# Patient Record
Sex: Male | Born: 1950 | Race: White | Hispanic: No | Smoking: Never smoker
Health system: Southern US, Community
[De-identification: ages and names within clinical notes are randomized; demographics above are authoritative.]

## PROBLEM LIST (undated history)

## (undated) DIAGNOSIS — L309 Dermatitis, unspecified: Secondary | ICD-10-CM

## (undated) DIAGNOSIS — F99 Mental disorder, not otherwise specified: Secondary | ICD-10-CM

## (undated) DIAGNOSIS — Z9114 Patient's other noncompliance with medication regimen: Secondary | ICD-10-CM

## (undated) DIAGNOSIS — M1990 Unspecified osteoarthritis, unspecified site: Secondary | ICD-10-CM

## (undated) DIAGNOSIS — D649 Anemia, unspecified: Secondary | ICD-10-CM

## (undated) DIAGNOSIS — Z992 Dependence on renal dialysis: Secondary | ICD-10-CM

## (undated) DIAGNOSIS — Z91148 Patient's other noncompliance with medication regimen for other reason: Secondary | ICD-10-CM

## (undated) DIAGNOSIS — N186 End stage renal disease: Secondary | ICD-10-CM

## (undated) DIAGNOSIS — M545 Low back pain, unspecified: Secondary | ICD-10-CM

## (undated) DIAGNOSIS — J90 Pleural effusion, not elsewhere classified: Secondary | ICD-10-CM

## (undated) DIAGNOSIS — I509 Heart failure, unspecified: Secondary | ICD-10-CM

## (undated) DIAGNOSIS — K219 Gastro-esophageal reflux disease without esophagitis: Secondary | ICD-10-CM

## (undated) DIAGNOSIS — C801 Malignant (primary) neoplasm, unspecified: Secondary | ICD-10-CM

## (undated) DIAGNOSIS — I10 Essential (primary) hypertension: Secondary | ICD-10-CM

## (undated) DIAGNOSIS — IMO0001 Reserved for inherently not codable concepts without codable children: Secondary | ICD-10-CM

## (undated) DIAGNOSIS — F419 Anxiety disorder, unspecified: Secondary | ICD-10-CM

## (undated) DIAGNOSIS — F321 Major depressive disorder, single episode, moderate: Secondary | ICD-10-CM

## (undated) DIAGNOSIS — Z9289 Personal history of other medical treatment: Secondary | ICD-10-CM

## (undated) HISTORY — PX: CERVICAL FUSION: SHX112

## (undated) HISTORY — PX: CARPAL TUNNEL RELEASE: SHX101

## (undated) HISTORY — PX: KNEE ARTHROSCOPY: SHX127

## (undated) HISTORY — PX: ROTATOR CUFF REPAIR: SHX139

## (undated) HISTORY — DX: Major depressive disorder, single episode, moderate: F32.1

## (undated) HISTORY — PX: OTHER SURGICAL HISTORY: SHX169

## (undated) HISTORY — PX: EYE SURGERY: SHX253

---

## 1999-06-04 ENCOUNTER — Ambulatory Visit (HOSPITAL_COMMUNITY): Admission: RE | Admit: 1999-06-04 | Discharge: 1999-06-04 | Payer: Self-pay | Admitting: Neurosurgery

## 1999-06-04 ENCOUNTER — Encounter: Payer: Self-pay | Admitting: Neurosurgery

## 1999-07-01 ENCOUNTER — Ambulatory Visit (HOSPITAL_COMMUNITY): Admission: RE | Admit: 1999-07-01 | Discharge: 1999-07-02 | Payer: Self-pay | Admitting: Neurosurgery

## 1999-07-01 ENCOUNTER — Encounter: Payer: Self-pay | Admitting: Neurosurgery

## 1999-07-27 ENCOUNTER — Ambulatory Visit (HOSPITAL_COMMUNITY): Admission: RE | Admit: 1999-07-27 | Discharge: 1999-07-27 | Payer: Self-pay | Admitting: Neurosurgery

## 1999-07-27 ENCOUNTER — Encounter: Payer: Self-pay | Admitting: Neurosurgery

## 1999-08-17 ENCOUNTER — Ambulatory Visit (HOSPITAL_COMMUNITY): Admission: RE | Admit: 1999-08-17 | Discharge: 1999-08-17 | Payer: Self-pay | Admitting: Neurosurgery

## 1999-08-17 ENCOUNTER — Encounter: Payer: Self-pay | Admitting: Neurosurgery

## 2000-09-21 ENCOUNTER — Encounter: Payer: Self-pay | Admitting: Neurosurgery

## 2000-09-21 ENCOUNTER — Ambulatory Visit (HOSPITAL_COMMUNITY): Admission: RE | Admit: 2000-09-21 | Discharge: 2000-09-21 | Payer: Self-pay | Admitting: Neurosurgery

## 2000-12-18 ENCOUNTER — Ambulatory Visit (HOSPITAL_COMMUNITY): Admission: RE | Admit: 2000-12-18 | Discharge: 2000-12-18 | Payer: Self-pay | Admitting: *Deleted

## 2001-04-30 ENCOUNTER — Ambulatory Visit (HOSPITAL_COMMUNITY): Admission: RE | Admit: 2001-04-30 | Discharge: 2001-04-30 | Payer: Self-pay | Admitting: Orthopedic Surgery

## 2003-11-07 ENCOUNTER — Ambulatory Visit (HOSPITAL_COMMUNITY): Admission: RE | Admit: 2003-11-07 | Discharge: 2003-11-08 | Payer: Self-pay | Admitting: Orthopedic Surgery

## 2011-04-11 ENCOUNTER — Other Ambulatory Visit: Payer: Self-pay | Admitting: Orthopaedic Surgery

## 2011-04-11 DIAGNOSIS — M4800 Spinal stenosis, site unspecified: Secondary | ICD-10-CM

## 2011-04-11 DIAGNOSIS — M542 Cervicalgia: Secondary | ICD-10-CM

## 2011-04-15 ENCOUNTER — Ambulatory Visit
Admission: RE | Admit: 2011-04-15 | Discharge: 2011-04-15 | Disposition: A | Discharge status: Home / Self Care | Payer: Commercial Indemnity | Source: Ambulatory Visit | Attending: Orthopaedic Surgery | Admitting: Orthopaedic Surgery

## 2011-04-15 DIAGNOSIS — M542 Cervicalgia: Secondary | ICD-10-CM

## 2011-04-15 DIAGNOSIS — M4800 Spinal stenosis, site unspecified: Secondary | ICD-10-CM

## 2011-06-20 ENCOUNTER — Other Ambulatory Visit: Payer: Self-pay | Admitting: Orthopedic Surgery

## 2011-06-20 DIAGNOSIS — M47812 Spondylosis without myelopathy or radiculopathy, cervical region: Secondary | ICD-10-CM

## 2011-06-23 ENCOUNTER — Ambulatory Visit
Admission: RE | Admit: 2011-06-23 | Discharge: 2011-06-23 | Disposition: A | Discharge status: Home / Self Care | Payer: Managed Care, Other (non HMO) | Source: Ambulatory Visit | Attending: Orthopedic Surgery | Admitting: Orthopedic Surgery

## 2011-06-23 DIAGNOSIS — M47812 Spondylosis without myelopathy or radiculopathy, cervical region: Secondary | ICD-10-CM

## 2012-01-12 ENCOUNTER — Encounter (HOSPITAL_COMMUNITY): Payer: Self-pay | Admitting: Pharmacist

## 2012-01-17 NOTE — H&P (Signed)
[name], MD   Biagio Borg, PA-C [address] [place], [place]  [address]                             [phone]   CHIEF COMPLAINT:  Painful right knee.   HISTORY OF PRESENT ILLNESS:  Jaqualin is a very pleasant 61 year old white male who is now widowed who has been having problems with his knees.  He has most recently been out of work for at least 7 months and has found out that there is no longer a job for him.  He has been on disability through his company though.  He has been having pain in the right knee now for at least 6 months and has been having pain with activities of daily living, as well as pain with every step.  It is now to the point where he is having severe pain which is more of an aching, throbbing pain with occasional sharpness in the right knee.  He has had numerous aspirations of both knees with corticosteroid injection as well as viscosupplementation.  He has most recently also had a corticosteroid injection but this is not proving very effective.  His pain is now pretty much constant and is bothering him to the point where he cannot sleep at night secondary to the pain.  He is seen today for evaluation.   PAST MEDICAL HISTORY/GENERAL HEALTH:  Is fair.   SURGERIES:   1.  In 1977 full right rotator cuff repair. 2.  In 1998 for carpal tunnel release on the right with ulnar nerve transposition of the elbow. 3.  In 1999 for left knee surgery. 4.  In 2000 and 2003 for right knee surgery. 5.  In 2001 for cervical fusion. 6.  In 2001 for left carpal tunnel release. 7.  In 2007 left carpal tunnel release. 8.  In 2005 left rotator cuff repair. 9.  Cervical disc fusion. 10.  In July 2013 for left knee arthroscopy.   MEDICATIONS:   1.  Lisinopril/hydrochlorothiazide 20/12.5 mg daily. 2.  Hydrocodone and Tylenol 5/500 p.r.n. 3.  Alprazolam 1 mg t.i.d.   ALLERGIES:  NAPROXEN, CELEBREX, BEXTRA BUT THIS IS GI BLEEDING UPSET TYPE SYMPTOMS.  HE REALLY DOES NOT HAVE A  TRUE ALLERGY.   REVIEW OF SYSTEMS: A 14-point review of systems is positive for hypertension since 2005.  He is on medications for this.  He also has positional vertigo which he controls with not getting his head into that position as well as occasional meclizine.  He also has a history of derma stasis and uses alprazolam.  All other symptomatology was denied.   FAMILY HISTORY:  Positive for mother who is alive at 4 years of age.  Father remains alive at 41 years of age.  Father has hypertension and arthritis.  He has no brothers.  He has 3 living sisters; 1 age 37, 15 age 28, and 19 age 48.  The 60 year old is on disability.   SOCIAL HISTORY: Mr. Cassis is a 61 year old white widowed male.  Presently on disability and unemployed.  He denies use of tobacco.  He will drink most days between 2 to 8 beers.  He presently lives alone.   PHYSICAL EXAMINATION:  A very pleasant 61 year old white male. Well-developed, well-nourished.  Alert, pleasant, cooperative in moderate distress.   Height is 5 feet 11 inches, weight 280 pounds, and BMI is 39.1. Vital signs reveal a temperature of  98.4, pulse 65, respirations 16, blood pressure 151/82. Head is normocephalic. Ears, nose and throat were benign. Neck was supple. No carotid bruits. Chest had good expansion. Lungs were essentially clear. Cardiac had a regular rhythm and rate with ectopics noted.  There was a grade 1-2 systolic murmur at the left sternal border.  Pulses were 2+ bilateral and symmetric in lower extremities. Abdomen was scaphoid, soft, nontender.  No masses palpable. Normal bowel sounds present. Genital, rectal and breast exam not indicated for orthopedic evaluation. CNS:  He is oriented x3 and cranial nerves II-XII grossly intact. Musculoskeletal:  Today, the right knee has range of motion from about 5 to 7 degrees shy of full extension to 110 degrees of flexion.  He has some pseudolaxity with varus and valgus stressing.  He does have a trace  to 1+ effusion.  Diffuse tenderness about the joint line.  Smooth motion to both hips.  Calf is supple, nontender.  Neurovascularly intact distally.   RADIOGRAPHS:  Three views reviewed from 08/10/2011 reveals periarticular spurring throughout the knee.  There appears to be some chondrocalcinosis or at least some loose bodies present.  He has significant patellofemoral DJD with sclerosing and almost no joint space noted with lateral positioning of the patella on the sunrise and lateral.  AP does reveal marked spurring and decreased medial compartment joint space.  Sclerosing is noted more medially than laterally at the tibial plateau.  There is some cystic changes at the lateral tibial plateau.   CLINICAL IMPRESSION:   1.  Osteoarthritis of the right knee. 2.  Hypertension. 3.  Anxiety.   RECOMMENDATIONS:  At this time because of failure of conservative treatment, we feel total joint replacement is indicated.  I reviewed a preoperative clearance by Dr. Wenda Overland that was received on 12/06/2011.  He feels that he is a candidate for surgery and he is cleared medically and cardiac wise. Therefore, the procedure, risks and benefits was fully explained to him in detail using appropriate models.  I have gone over the complications with him and he is understanding.  I have answered his questions.    Mike Craze [place], [place] [phone]  01/17/2012 1:38 PM

## 2012-01-17 NOTE — Pre-Procedure Instructions (Signed)
[place]  01/17/2012   Your procedure is scheduled on:  Tuesday, November 26th.  Report to [facility] at 5:30 AM.  Call this number if you have problems the morning of surgery: [phone]   Remember:Nothing to eat or drink after Midnight.      Take these medicines the morning of surgery with A SIP OF WATER: May  Take Alprazolam (Xanax) and pain medication if needed.   Do not wear jewelry, make-up or nail polish.  Do not wear lotions, powders, or perfumes. You may wear deodorant.  Do not shave 48 hours prior to surgery. Men may shave face and neck.  Do not bring valuables to the hospital.  Contacts, dentures or bridgework may not be worn into surgery.  Leave suitcase in the car. After surgery it may be brought to your room.  For patients admitted to the hospital, checkout time is 11:00 AM the day of discharge.    Patients discharged the day of surgery will not be allowed to drive home.  Name and phone number of your driver: NA   Special Instructions: Shower using CHG 2 nights before surgery and the night before surgery.  If you shower the day of surgery use CHG.  Use special wash - you have one bottle of CHG for all showers.  You should use approximately 1/3 of the bottle for each shower.   Please read over the following fact sheets that you were given: Pain Booklet, Coughing and Deep Breathing, Blood Transfusion Information and Surgical Site Infection Prevention

## 2012-01-18 ENCOUNTER — Encounter (HOSPITAL_COMMUNITY): Payer: Self-pay

## 2012-01-18 ENCOUNTER — Encounter (HOSPITAL_COMMUNITY)
Admission: RE | Admit: 2012-01-18 | Discharge: 2012-01-18 | Disposition: A | Discharge status: Home / Self Care | Payer: Managed Care, Other (non HMO) | Source: Ambulatory Visit | Attending: Orthopedic Surgery | Admitting: Orthopedic Surgery

## 2012-01-18 ENCOUNTER — Encounter (HOSPITAL_COMMUNITY)
Admission: RE | Admit: 2012-01-18 | Discharge: 2012-01-18 | Payer: Managed Care, Other (non HMO) | Source: Ambulatory Visit | Attending: Orthopaedic Surgery | Admitting: Orthopaedic Surgery

## 2012-01-18 HISTORY — DX: Anxiety disorder, unspecified: F41.9

## 2012-01-18 HISTORY — DX: Unspecified osteoarthritis, unspecified site: M19.90

## 2012-01-18 HISTORY — DX: Mental disorder, not otherwise specified: F99

## 2012-01-18 HISTORY — DX: Essential (primary) hypertension: I10

## 2012-01-18 HISTORY — DX: Dermatitis, unspecified: L30.9

## 2012-01-18 LAB — CBC
MCH: 34.7 pg — ABNORMAL HIGH (ref 26.0–34.0)
MCHC: 36.9 g/dL — ABNORMAL HIGH (ref 30.0–36.0)
MCV: 93.8 fL (ref 78.0–100.0)
Platelets: 285 10*3/uL (ref 150–400)
RBC: 4.53 MIL/uL (ref 4.22–5.81)

## 2012-01-18 LAB — SURGICAL PCR SCREEN
MRSA, PCR: NEGATIVE
Staphylococcus aureus: NEGATIVE

## 2012-01-18 LAB — COMPREHENSIVE METABOLIC PANEL
AST: 26 U/L (ref 0–37)
CO2: 29 mEq/L (ref 19–32)
Calcium: 9.6 mg/dL (ref 8.4–10.5)
Creatinine, Ser: 0.91 mg/dL (ref 0.50–1.35)
GFR calc Af Amer: >90 mL/min (ref >90)
GFR calc non Af Amer: >90 mL/min (ref >90)
Sodium: 122 mEq/L — ABNORMAL LOW (ref 135–145)
Total Protein: 7.2 g/dL (ref 6.0–8.3)

## 2012-01-18 LAB — URINALYSIS, ROUTINE W REFLEX MICROSCOPIC
Glucose, UA: NEGATIVE mg/dL
Ketones, ur: NEGATIVE mg/dL
Protein, ur: NEGATIVE mg/dL
Urobilinogen, UA: 0.2 mg/dL (ref 0.0–1.0)

## 2012-01-18 LAB — TYPE AND SCREEN
ABO/RH(D): B POS
Antibody Screen: NEGATIVE

## 2012-01-18 LAB — PROTIME-INR: INR: 0.95 (ref 0.00–1.49)

## 2012-01-18 LAB — URINE MICROSCOPIC-ADD ON

## 2012-01-18 NOTE — Progress Notes (Signed)
Patient informed Nurse that he had stress test approximately 10 years ago. Patient denied having a cardiac cath or sleep study. Nurse faxed sleep apnea screening to PCP Dr. Celedonio Savage in [place], [place] Office # [phone] fax [phone].

## 2012-01-18 NOTE — Progress Notes (Signed)
01/18/12 1002  OBSTRUCTIVE SLEEP APNEA  Score 4 or greater  Results sent to PCP

## 2012-01-18 NOTE — Progress Notes (Signed)
Dana from blood bank called and informed Nurse that possibly another type and screen would need to be drawn the day of surgery because the current sample did not match a old sample obtained from patient several years ago. Hinton Dyer said she would run this by her supervisor, and if another sample was not needed the day of surgery that blood bank would let us know. The blue band currently on patients arm will remain the same if another sample needed to be drawn. Order entered into EPIC for type and screen the DOS.

## 2012-01-19 ENCOUNTER — Encounter (HOSPITAL_COMMUNITY): Payer: Self-pay | Admitting: Vascular Surgery

## 2012-01-19 NOTE — Consult Note (Signed)
Anesthesia chart review: Patient is a 61 year old male scheduled for right total knee replacement on 01/24/2012. History includes obesity, nonsmoker, hypertension, anxiety, arthritis, prior cervical fusion. PCP is Dr. Matthias Hughs.  Labs noted.  Na is 122, Cl 85.  Cr 0.91.  H/H15.7/42.5.  PT/PTT WNL.  He may need a repeat T&S (see note from Ara Kussmaul).  He will need a repeat BMET preoperatively.  If there is any question regarding his T&S specimen then I think it should be redrawn (order already entered by PAT RN).  (His Short Stay RN is to contact the Blood Bank on the day of surgery.)  I spoke with Biagio Borg, PA-C regarding BMET results.  He will most likely have patient repeat labs prior to the day of surgery and make further recommendations at that time.   Chest x-ray on 01/18/2012 showed no acute abnormality.  EKG on 01/18/2012 showed normal sinus rhythm, septal infarct, age undetermined.  He has multiple EKGs in [place] since May 2001.  Overall, I think his EKGs are stable.  I would anticipate that he can proceed once his hyponatremia improves.  Myra Gianotti, PA-C 01/19/12 1617

## 2012-01-20 NOTE — Progress Notes (Signed)
Spoke with Dr Orene Desanctis from anesthesia today about Mr Degrasse's sodium level of 122 with a repeat today at [facility] of 124.  Apparently this is a chronic problem   On 01/24/2011 Na was 128.  On 08/12/2011 Na was 126.  Had surgery on 08/16/2011 with Dr [name] at [place] without problems  This is a chronic problem and Dr Orene Desanctis states that since it is chronic then they should be able to proceed with the surgery.  Mike Craze [place], [place] [phone]  01/20/2012 11:13 AM

## 2012-01-23 MED ORDER — CHLORHEXIDINE GLUCONATE 4 % EX LIQD: 60.0000 mL | Freq: Once | CUTANEOUS | Status: DC

## 2012-01-23 MED ORDER — CHLORHEXIDINE GLUCONATE 4 % EX LIQD: 60.0000 mL | Freq: Every day | CUTANEOUS | Status: DC

## 2012-01-23 MED ORDER — CEFAZOLIN SODIUM 10 G IJ SOLR
3.0000 g | INTRAMUSCULAR | Status: AC
  Administered 2012-01-24: 3 g via INTRAVENOUS
  Filled 2012-01-23: qty 3000

## 2012-01-23 NOTE — Progress Notes (Signed)
Spoke with patient and instructed him to arrive at 0700 for surgery time of 0900.

## 2012-01-24 ENCOUNTER — Ambulatory Visit (HOSPITAL_COMMUNITY): Payer: Managed Care, Other (non HMO) | Admitting: Vascular Surgery

## 2012-01-24 ENCOUNTER — Encounter (HOSPITAL_COMMUNITY): Payer: Self-pay | Admitting: *Deleted

## 2012-01-24 ENCOUNTER — Inpatient Hospital Stay (HOSPITAL_COMMUNITY)
Admission: RE | Admit: 2012-01-24 | Discharge: 2012-01-26 | DRG: 470 | Disposition: A | Discharge status: Home / Self Care | Payer: Managed Care, Other (non HMO) | Source: Ambulatory Visit | Attending: Orthopaedic Surgery | Admitting: Orthopaedic Surgery

## 2012-01-24 ENCOUNTER — Encounter (HOSPITAL_COMMUNITY)
Admission: RE | Disposition: A | Discharge status: Home / Self Care | Payer: Self-pay | Source: Ambulatory Visit | Attending: Orthopaedic Surgery

## 2012-01-24 ENCOUNTER — Encounter (HOSPITAL_COMMUNITY): Payer: Self-pay | Admitting: Vascular Surgery

## 2012-01-24 DIAGNOSIS — I10 Essential (primary) hypertension: Secondary | ICD-10-CM | POA: Diagnosis present

## 2012-01-24 DIAGNOSIS — E871 Hypo-osmolality and hyponatremia: Secondary | ICD-10-CM | POA: Diagnosis present

## 2012-01-24 DIAGNOSIS — F411 Generalized anxiety disorder: Secondary | ICD-10-CM | POA: Diagnosis present

## 2012-01-24 DIAGNOSIS — M1710 Unilateral primary osteoarthritis, unspecified knee: Secondary | ICD-10-CM

## 2012-01-24 DIAGNOSIS — M1711 Unilateral primary osteoarthritis, right knee: Secondary | ICD-10-CM | POA: Diagnosis present

## 2012-01-24 DIAGNOSIS — M179 Osteoarthritis of knee, unspecified: Secondary | ICD-10-CM

## 2012-01-24 DIAGNOSIS — M1712 Unilateral primary osteoarthritis, left knee: Secondary | ICD-10-CM | POA: Diagnosis present

## 2012-01-24 DIAGNOSIS — IMO0002 Reserved for concepts with insufficient information to code with codable children: Principal | ICD-10-CM | POA: Diagnosis present

## 2012-01-24 DIAGNOSIS — D62 Acute posthemorrhagic anemia: Secondary | ICD-10-CM | POA: Diagnosis not present

## 2012-01-24 HISTORY — PX: TOTAL KNEE ARTHROPLASTY: SHX125

## 2012-01-24 LAB — BASIC METABOLIC PANEL
BUN: 9 mg/dL (ref 6–23)
Calcium: 9.7 mg/dL (ref 8.4–10.5)
GFR calc non Af Amer: 78 mL/min — ABNORMAL LOW (ref >90)
Glucose, Bld: 99 mg/dL (ref 70–99)

## 2012-01-24 SURGERY — ARTHROPLASTY, KNEE, TOTAL
Anesthesia: Regional | Site: Knee | Laterality: Right | Wound class: Clean

## 2012-01-24 MED ORDER — KETOROLAC TROMETHAMINE 30 MG/ML IJ SOLN
INTRAMUSCULAR | Status: AC
  Administered 2012-01-24: 15 mg
  Filled 2012-01-24: qty 1

## 2012-01-24 MED ORDER — OXYCODONE HCL 5 MG PO TABS
ORAL_TABLET | ORAL | Status: AC
  Filled 2012-01-24: qty 2

## 2012-01-24 MED ORDER — LIDOCAINE HCL (CARDIAC) 20 MG/ML IV SOLN
INTRAVENOUS | Status: DC | PRN
  Administered 2012-01-24: 80 mg via INTRAVENOUS

## 2012-01-24 MED ORDER — BUPIVACAINE-EPINEPHRINE PF 0.5-1:200000 % IJ SOLN
INTRAMUSCULAR | Status: DC | PRN
  Administered 2012-01-24: 30 mL

## 2012-01-24 MED ORDER — OXYCODONE HCL 5 MG PO TABS: 5.0000 mg | ORAL_TABLET | Freq: Once | ORAL | Status: DC | PRN

## 2012-01-24 MED ORDER — THROMBIN 20000 UNITS EX KIT
PACK | CUTANEOUS | Status: AC
  Filled 2012-01-24: qty 1

## 2012-01-24 MED ORDER — PROPOFOL 10 MG/ML IV BOLUS
INTRAVENOUS | Status: DC | PRN
  Administered 2012-01-24: 200 mg via INTRAVENOUS
  Administered 2012-01-24: 30 mg via INTRAVENOUS
  Administered 2012-01-24: 20 mg via INTRAVENOUS
  Administered 2012-01-24: 50 mg via INTRAVENOUS

## 2012-01-24 MED ORDER — POLYVINYL ALCOHOL 1.4 % OP SOLN
1.0000 [drp] | Freq: Two times a day (BID) | OPHTHALMIC | Status: DC | PRN
  Filled 2012-01-24: qty 15

## 2012-01-24 MED ORDER — ONDANSETRON HCL 4 MG/2ML IJ SOLN: 4.0000 mg | Freq: Four times a day (QID) | INTRAMUSCULAR | Status: DC | PRN

## 2012-01-24 MED ORDER — SODIUM CHLORIDE 0.9 % IV SOLN: INTRAVENOUS | Status: DC

## 2012-01-24 MED ORDER — RIVAROXABAN 10 MG PO TABS
10.0000 mg | ORAL_TABLET | ORAL | Status: DC
  Administered 2012-01-25 – 2012-01-26 (×2): 10 mg via ORAL
  Filled 2012-01-24 (×3): qty 1

## 2012-01-24 MED ORDER — METOCLOPRAMIDE HCL 10 MG PO TABS: 5.0000 mg | ORAL_TABLET | Freq: Three times a day (TID) | ORAL | Status: DC | PRN

## 2012-01-24 MED ORDER — FENTANYL CITRATE 0.05 MG/ML IJ SOLN: 50.0000 ug | INTRAMUSCULAR | Status: DC | PRN

## 2012-01-24 MED ORDER — BUPIVACAINE HCL (PF) 0.25 % IJ SOLN
INTRAMUSCULAR | Status: AC
  Filled 2012-01-24: qty 30

## 2012-01-24 MED ORDER — LACTATED RINGERS IV SOLN
INTRAVENOUS | Status: DC | PRN
  Administered 2012-01-24 (×2): via INTRAVENOUS

## 2012-01-24 MED ORDER — METOCLOPRAMIDE HCL 5 MG/ML IJ SOLN: 5.0000 mg | Freq: Three times a day (TID) | INTRAMUSCULAR | Status: DC | PRN

## 2012-01-24 MED ORDER — OXYCODONE HCL 5 MG/5ML PO SOLN: 5.0000 mg | Freq: Once | ORAL | Status: DC | PRN

## 2012-01-24 MED ORDER — DOCUSATE SODIUM 100 MG PO CAPS
100.0000 mg | ORAL_CAPSULE | Freq: Two times a day (BID) | ORAL | Status: DC
  Administered 2012-01-24 – 2012-01-26 (×4): 100 mg via ORAL
  Filled 2012-01-24 (×5): qty 1

## 2012-01-24 MED ORDER — PHENOL 1.4 % MT LIQD: 1.0000 | OROMUCOSAL | Status: DC | PRN

## 2012-01-24 MED ORDER — ACETAMINOPHEN 10 MG/ML IV SOLN
1000.0000 mg | Freq: Once | INTRAVENOUS | Status: AC
  Administered 2012-01-24: 1000 mg via INTRAVENOUS
  Filled 2012-01-24: qty 100

## 2012-01-24 MED ORDER — OXYCODONE HCL 5 MG PO TABS
5.0000 mg | ORAL_TABLET | ORAL | Status: DC | PRN
Start: 2012-01-24 — End: 2012-01-26
  Administered 2012-01-24 – 2012-01-26 (×8): 10 mg via ORAL
  Filled 2012-01-24 (×7): qty 2

## 2012-01-24 MED ORDER — CEFAZOLIN SODIUM-DEXTROSE 2-3 GM-% IV SOLR
2.0000 g | Freq: Four times a day (QID) | INTRAVENOUS | Status: AC
  Administered 2012-01-24 (×2): 2 g via INTRAVENOUS
  Filled 2012-01-24 (×2): qty 50

## 2012-01-24 MED ORDER — KETOROLAC TROMETHAMINE 15 MG/ML IJ SOLN
15.0000 mg | Freq: Four times a day (QID) | INTRAMUSCULAR | Status: AC
  Administered 2012-01-24 (×2): 15 mg via INTRAVENOUS
  Filled 2012-01-24 (×3): qty 1

## 2012-01-24 MED ORDER — METHOCARBAMOL 500 MG PO TABS
500.0000 mg | ORAL_TABLET | Freq: Four times a day (QID) | ORAL | Status: DC | PRN
  Administered 2012-01-24 – 2012-01-26 (×6): 500 mg via ORAL
  Filled 2012-01-24 (×5): qty 1

## 2012-01-24 MED ORDER — ACETAMINOPHEN 10 MG/ML IV SOLN
1000.0000 mg | Freq: Four times a day (QID) | INTRAVENOUS | Status: AC
  Administered 2012-01-24 – 2012-01-25 (×3): 1000 mg via INTRAVENOUS
  Filled 2012-01-24 (×4): qty 100

## 2012-01-24 MED ORDER — HYDROMORPHONE HCL PF 1 MG/ML IJ SOLN
INTRAMUSCULAR | Status: AC
  Filled 2012-01-24: qty 1

## 2012-01-24 MED ORDER — LACTATED RINGERS IV SOLN
INTRAVENOUS | Status: DC
  Administered 2012-01-24: 08:00:00 via INTRAVENOUS

## 2012-01-24 MED ORDER — BISACODYL 10 MG RE SUPP: 10.0000 mg | Freq: Every day | RECTAL | Status: DC | PRN

## 2012-01-24 MED ORDER — HYDROCHLOROTHIAZIDE 12.5 MG PO CAPS
12.5000 mg | ORAL_CAPSULE | Freq: Every day | ORAL | Status: DC
  Administered 2012-01-24 – 2012-01-26 (×3): 12.5 mg via ORAL
  Filled 2012-01-24 (×3): qty 1

## 2012-01-24 MED ORDER — ARTIFICIAL TEARS OP OINT
TOPICAL_OINTMENT | OPHTHALMIC | Status: DC | PRN
  Administered 2012-01-24: 1 via OPHTHALMIC

## 2012-01-24 MED ORDER — ONDANSETRON HCL 4 MG/2ML IJ SOLN
INTRAMUSCULAR | Status: DC | PRN
  Administered 2012-01-24: 4 mg via INTRAVENOUS

## 2012-01-24 MED ORDER — ALPRAZOLAM 0.5 MG PO TABS
1.0000 mg | ORAL_TABLET | Freq: Three times a day (TID) | ORAL | Status: DC | PRN
  Administered 2012-01-24 – 2012-01-26 (×2): 1 mg via ORAL
  Filled 2012-01-24 (×2): qty 2

## 2012-01-24 MED ORDER — BUPIVACAINE HCL 0.25 % IJ SOLN
INTRAMUSCULAR | Status: DC | PRN
  Administered 2012-01-24: 30 mL

## 2012-01-24 MED ORDER — ALUM & MAG HYDROXIDE-SIMETH 200-200-20 MG/5ML PO SUSP: 30.0000 mL | ORAL | Status: DC | PRN

## 2012-01-24 MED ORDER — HYDROMORPHONE HCL PF 1 MG/ML IJ SOLN
0.2500 mg | INTRAMUSCULAR | Status: DC | PRN
  Administered 2012-01-24: 0.25 mg via INTRAVENOUS
  Administered 2012-01-24: 0.5 mg via INTRAVENOUS

## 2012-01-24 MED ORDER — ACETAMINOPHEN 10 MG/ML IV SOLN
INTRAVENOUS | Status: AC
  Filled 2012-01-24: qty 100

## 2012-01-24 MED ORDER — MENTHOL 3 MG MT LOZG: 1.0000 | LOZENGE | OROMUCOSAL | Status: DC | PRN

## 2012-01-24 MED ORDER — BUPIVACAINE-EPINEPHRINE PF 0.25-1:200000 % IJ SOLN
INTRAMUSCULAR | Status: AC
  Filled 2012-01-24: qty 30

## 2012-01-24 MED ORDER — FENTANYL CITRATE 0.05 MG/ML IJ SOLN
INTRAMUSCULAR | Status: DC | PRN
  Administered 2012-01-24: 50 ug via INTRAVENOUS
  Administered 2012-01-24: 100 ug via INTRAVENOUS
  Administered 2012-01-24: 25 ug via INTRAVENOUS
  Administered 2012-01-24: 50 ug via INTRAVENOUS
  Administered 2012-01-24 (×3): 25 ug via INTRAVENOUS

## 2012-01-24 MED ORDER — MIDAZOLAM HCL 2 MG/2ML IJ SOLN: 1.0000 mg | INTRAMUSCULAR | Status: DC | PRN

## 2012-01-24 MED ORDER — LISINOPRIL 20 MG PO TABS
20.0000 mg | ORAL_TABLET | Freq: Every day | ORAL | Status: DC
  Administered 2012-01-24 – 2012-01-26 (×3): 20 mg via ORAL
  Filled 2012-01-24 (×3): qty 1

## 2012-01-24 MED ORDER — POLYETHYL GLYCOL-PROPYL GLYCOL 0.4-0.3 % OP SOLN: 1.0000 [drp] | Freq: Two times a day (BID) | OPHTHALMIC | Status: DC | PRN

## 2012-01-24 MED ORDER — 0.9 % SODIUM CHLORIDE (POUR BTL) OPTIME
TOPICAL | Status: DC | PRN
  Administered 2012-01-24: 1000 mL

## 2012-01-24 MED ORDER — THROMBIN 20000 UNITS EX KIT
PACK | CUTANEOUS | Status: DC | PRN
  Administered 2012-01-24: 20000 [IU] via TOPICAL

## 2012-01-24 MED ORDER — DEXTROSE 5 % IV SOLN: 500.0000 mg | Freq: Four times a day (QID) | INTRAVENOUS | Status: DC | PRN

## 2012-01-24 MED ORDER — FLEET ENEMA 7-19 GM/118ML RE ENEM: 1.0000 | ENEMA | Freq: Once | RECTAL | Status: AC | PRN

## 2012-01-24 MED ORDER — LISINOPRIL-HYDROCHLOROTHIAZIDE 20-12.5 MG PO TABS
1.0000 | ORAL_TABLET | Freq: Every day | ORAL | Status: DC
Start: 2012-01-24 — End: 2012-01-24

## 2012-01-24 MED ORDER — HYDROMORPHONE HCL PF 1 MG/ML IJ SOLN: 0.5000 mg | INTRAMUSCULAR | Status: DC | PRN

## 2012-01-24 MED ORDER — PHENYLEPHRINE HCL 10 MG/ML IJ SOLN
INTRAMUSCULAR | Status: DC | PRN
  Administered 2012-01-24 (×2): 40 ug via INTRAVENOUS

## 2012-01-24 MED ORDER — SODIUM CHLORIDE 0.9 % IV SOLN: 75.0000 mL/h | INTRAVENOUS | Status: DC

## 2012-01-24 MED ORDER — MIDAZOLAM HCL 5 MG/5ML IJ SOLN
INTRAMUSCULAR | Status: DC | PRN
  Administered 2012-01-24: 2 mg via INTRAVENOUS

## 2012-01-24 MED ORDER — MAGNESIUM HYDROXIDE 400 MG/5ML PO SUSP: 30.0000 mL | Freq: Every day | ORAL | Status: DC | PRN

## 2012-01-24 MED ORDER — ONDANSETRON HCL 4 MG PO TABS: 4.0000 mg | ORAL_TABLET | Freq: Four times a day (QID) | ORAL | Status: DC | PRN

## 2012-01-24 MED ORDER — SODIUM CHLORIDE 0.9 % IR SOLN
Status: DC | PRN
  Administered 2012-01-24: 3000 mL

## 2012-01-24 MED ORDER — METHOCARBAMOL 500 MG PO TABS
ORAL_TABLET | ORAL | Status: AC
  Filled 2012-01-24: qty 1

## 2012-01-24 SURGICAL SUPPLY — 59 items
BANDAGE ESMARK 6X9 LF (GAUZE/BANDAGES/DRESSINGS) ×1 IMPLANT
BLADE SAGITTAL 25.0X1.19X90 (BLADE) ×2 IMPLANT
BNDG CMPR 9X6 STRL LF SNTH (GAUZE/BANDAGES/DRESSINGS) ×1
BNDG ESMARK 6X9 LF (GAUZE/BANDAGES/DRESSINGS) ×2
BOWL SMART MIX CTS (DISPOSABLE) ×2 IMPLANT
CEMENT HV SMART SET (Cement) ×2 IMPLANT
CLOTH BEACON ORANGE TIMEOUT ST (SAFETY) ×2 IMPLANT
COVER BACK TABLE 24X17X13 BIG (DRAPES) ×2 IMPLANT
COVER SURGICAL LIGHT HANDLE (MISCELLANEOUS) ×2 IMPLANT
CUFF TOURNIQUET SINGLE 34IN LL (TOURNIQUET CUFF) IMPLANT
CUFF TOURNIQUET SINGLE 44IN (TOURNIQUET CUFF) IMPLANT
DRAPE EXTREMITY T 121X128X90 (DRAPE) ×2 IMPLANT
DRAPE PROXIMA HALF (DRAPES) ×2 IMPLANT
DRSG ADAPTIC 3X8 NADH LF (GAUZE/BANDAGES/DRESSINGS) ×2 IMPLANT
DRSG PAD ABDOMINAL 8X10 ST (GAUZE/BANDAGES/DRESSINGS) ×4 IMPLANT
DURAPREP 26ML APPLICATOR (WOUND CARE) ×2 IMPLANT
ELECT CAUTERY BLADE 6.4 (BLADE) ×4 IMPLANT
ELECT REM PT RETURN 9FT ADLT (ELECTROSURGICAL) ×2
ELECTRODE REM PT RTRN 9FT ADLT (ELECTROSURGICAL) ×1 IMPLANT
EVACUATOR 1/8 PVC DRAIN (DRAIN) ×2 IMPLANT
FACESHIELD LNG OPTICON STERILE (SAFETY) ×4 IMPLANT
FLOSEAL 10ML (HEMOSTASIS) IMPLANT
GLOVE BIOGEL PI IND STRL 8 (GLOVE) ×1 IMPLANT
GLOVE BIOGEL PI IND STRL 8.5 (GLOVE) ×1 IMPLANT
GLOVE BIOGEL PI INDICATOR 8 (GLOVE) ×1
GLOVE BIOGEL PI INDICATOR 8.5 (GLOVE) ×1
GLOVE ECLIPSE 8.0 STRL XLNG CF (GLOVE) ×4 IMPLANT
GLOVE SURG ORTHO 8.5 STRL (GLOVE) ×2 IMPLANT
GOWN PREVENTION PLUS XXLARGE (GOWN DISPOSABLE) ×2 IMPLANT
GOWN STRL NON-REIN LRG LVL3 (GOWN DISPOSABLE) ×4 IMPLANT
HANDPIECE INTERPULSE COAX TIP (DISPOSABLE) ×2
KIT BASIN OR (CUSTOM PROCEDURE TRAY) ×2 IMPLANT
KIT ROOM TURNOVER OR (KITS) ×2 IMPLANT
MANIFOLD NEPTUNE II (INSTRUMENTS) ×2 IMPLANT
NEEDLE 22X1 1/2 (OR ONLY) (NEEDLE) ×2 IMPLANT
NS IRRIG 1000ML POUR BTL (IV SOLUTION) ×2 IMPLANT
PACK TOTAL JOINT (CUSTOM PROCEDURE TRAY) ×2 IMPLANT
PAD ARMBOARD 7.5X6 YLW CONV (MISCELLANEOUS) ×4 IMPLANT
PAD CAST 4YDX4 CTTN HI CHSV (CAST SUPPLIES) ×2 IMPLANT
PADDING CAST COTTON 4X4 STRL (CAST SUPPLIES) ×4
PADDING CAST COTTON 6X4 STRL (CAST SUPPLIES) IMPLANT
PENCIL BUTTON BLDE SNGL 10FT (ELECTRODE) ×2 IMPLANT
SET HNDPC FAN SPRY TIP SCT (DISPOSABLE) ×1 IMPLANT
SPONGE GAUZE 4X4 12PLY (GAUZE/BANDAGES/DRESSINGS) ×2 IMPLANT
STAPLER VISISTAT 35W (STAPLE) ×2 IMPLANT
SUCTION FRAZIER TIP 10 FR DISP (SUCTIONS) ×2 IMPLANT
SUT BONE WAX W31G (SUTURE) ×2 IMPLANT
SUT ETHIBOND NAB CT1 #1 30IN (SUTURE) ×6 IMPLANT
SUT MNCRL AB 3-0 PS2 18 (SUTURE) ×2 IMPLANT
SUT VIC AB 0 CT1 27 (SUTURE) ×8
SUT VIC AB 0 CT1 27XBRD ANBCTR (SUTURE) ×4 IMPLANT
SUT VIC AB 1 CT1 27 (SUTURE) ×1
SUT VIC AB 1 CT1 27XBRD ANBCTR (SUTURE) ×1 IMPLANT
SYR CONTROL 10ML LL (SYRINGE) ×2 IMPLANT
TOWEL OR 17X24 6PK STRL BLUE (TOWEL DISPOSABLE) ×2 IMPLANT
TOWEL OR 17X26 10 PK STRL BLUE (TOWEL DISPOSABLE) ×2 IMPLANT
TRAY FOLEY CATH 14FR (SET/KITS/TRAYS/PACK) IMPLANT
WATER STERILE IRR 1000ML POUR (IV SOLUTION) ×6 IMPLANT
WRAP KNEE MAXI GEL POST OP (GAUZE/BANDAGES/DRESSINGS) ×2 IMPLANT

## 2012-01-24 NOTE — Anesthesia Procedure Notes (Signed)
Anesthesia Regional Block:  Femoral nerve block  Pre-Anesthetic Checklist: ,, timeout performed, Correct Patient, Correct Site, Correct Laterality, Correct Procedure,, site marked, risks and benefits discussed, Surgical consent,  Pre-op evaluation,  At surgeon's request and post-op pain management  Laterality: Right  Prep: chloraprep       Needles:  Injection technique: Single-shot  Needle Type: Echogenic Stimulator Needle     Needle Length: 9cm  Needle Gauge: 21    Additional Needles:  Procedures: nerve stimulator Femoral nerve block  Nerve Stimulator or Paresthesia:  Response: Quadriceps muscle contraction, 0.45 mA,   Additional Responses:   Narrative:  Start time: 01/24/2012 8:32 AM End time: 01/24/2012 8:46 AM Injection made incrementally with aspirations every 5 mL.  Performed by: Personally  Anesthesiologist: Dr Marcie Bal  Additional Notes: Functioning IV was confirmed and monitors were applied.  A 9mm 21ga Arrow echogenic stimulator needle was used. Sterile prep and drape,hand hygiene and sterile gloves were used.  Negative aspiration and negative test dose prior to incremental administration of local anesthetic. The patient tolerated the procedure well.    Femoral nerve block

## 2012-01-24 NOTE — Anesthesia Preprocedure Evaluation (Signed)
Anesthesia Evaluation  Patient identified by MRN, date of birth, ID band Patient awake    Reviewed: Allergy & Precautions, H&P , NPO status , Patient's Chart, lab work & pertinent test results  Airway Mallampati: II  Neck ROM: limited   Comment: S/p cervical fusion Dental   Pulmonary          Cardiovascular hypertension,     Neuro/Psych PSYCHIATRIC DISORDERS Anxiety    GI/Hepatic   Endo/Other  Morbid obesity  Renal/GU      Musculoskeletal  (+) Arthritis -,   Abdominal   Peds  Hematology   Anesthesia Other Findings   Reproductive/Obstetrics                           Anesthesia Physical Anesthesia Plan  ASA: II  Anesthesia Plan: General and Regional   Post-op Pain Management: MAC Combined w/ Regional for Post-op pain   Induction: Intravenous  Airway Management Planned: LMA  Additional Equipment:   Intra-op Plan:   Post-operative Plan:   Informed Consent: I have reviewed the patients History and Physical, chart, labs and discussed the procedure including the risks, benefits and alternatives for the proposed anesthesia with the patient or authorized representative who has indicated his/her understanding and acceptance.     Plan Discussed with: CRNA and Surgeon  Anesthesia Plan Comments:         Anesthesia Quick Evaluation

## 2012-01-24 NOTE — Transfer of Care (Signed)
Immediate Anesthesia Transfer of Care Note  Patient: Jesus [name]  Procedure(s) Performed: Procedure(s) (LRB) with comments: TOTAL KNEE ARTHROPLASTY (Right) - RIGHT TOTAL KNEE REPLACEMENT  Patient Location: PACU  Anesthesia Type:General  Level of Consciousness: awake, alert  and oriented  Airway & Oxygen Therapy: Patient Spontanous Breathing and Patient connected to nasal cannula oxygen  Post-op Assessment: Report given to PACU RN and Post -op Vital signs reviewed and stable  Post vital signs: Reviewed and stable  Complications: No apparent anesthesia complications

## 2012-01-24 NOTE — Progress Notes (Signed)
Orthopedic Tech Progress Note Patient Details:  Jesus [name] June 09, 1950 [id number]  CPM Right Knee CPM Right Knee: On Right Knee Flexion (Degrees): 60  Right Knee Extension (Degrees): 0  Additional Comments: trapeze bar   Cammer, Theodoro Parma 01/24/2012, 5:20 PM

## 2012-01-24 NOTE — Preoperative (Signed)
Beta Blockers   Reason not to administer Beta Blockers:Not Applicable, No BB

## 2012-01-24 NOTE — Progress Notes (Signed)
Patient ID: Jesus [name], male   DOB: 07/20/1950, 61 y.o.   MRN: [id number] The recent History & Physical has been reviewed. I have personally examined the patient today. There is no interval change to the documented History & Physical. The patient would like to proceed with the procedure.  [name] W 01/24/2012,  9:54 AM

## 2012-01-24 NOTE — Op Note (Signed)
PATIENT ID:      KAYSHAUN EVETTS  MRN:     [id number] DOB/AGE:    60-Dec-1952 / 60 y.o.       OPERATIVE REPORT    DATE OF PROCEDURE:  01/24/2012       PREOPERATIVE DIAGNOSIS:   END STAGE OA RIGHT KNEE                                                       There is no height or weight on file to calculate BMI.     POSTOPERATIVE DIAGNOSIS:   END STAGE OA RIGHT KNEE                                                                     There is no height or weight on file to calculate BMI.     PROCEDURE:  Procedure(s): TOTAL KNEE ARTHROPLASTY right     SURGEON:  [name], MD    ASSISTANT:   Biagio Borg, PA-C   (Present and scrubbed throughout the case, critical for assistance with exposure, retraction, instrumentation, and closure.)          ANESTHESIA: regional and general     DRAINS: (right knee) Hemovact drain(s) in the open with  Suction Open :      TOURNIQUET TIME:  Total Tourniquet Time Documented: Thigh (Right) - 83 minutes    COMPLICATIONS:  None   CONDITION:  stable  PROCEDURE IN [id number]  Audris Speaker W 01/24/2012, 12:05 PM

## 2012-01-24 NOTE — Anesthesia Postprocedure Evaluation (Signed)
Anesthesia Post Note  Patient: Jesus [name]  Procedure(s) Performed: Procedure(s) (LRB): TOTAL KNEE ARTHROPLASTY (Right)  Anesthesia type: General  Patient location: PACU  Post pain: Pain level controlled and Adequate analgesia  Post assessment: Post-op Vital signs reviewed, Patient's Cardiovascular Status Stable, Respiratory Function Stable, Patent Airway and Pain level controlled  Last Vitals:  Filed Vitals:   01/24/12 1230  BP:   Pulse: 67  Temp: 36.3 C  Resp:     Post vital signs: Reviewed and stable  Level of consciousness: awake, alert  and oriented  Complications: No apparent anesthesia complications

## 2012-01-25 LAB — BASIC METABOLIC PANEL
Chloride: 90 mEq/L — ABNORMAL LOW (ref 96–112)
Creatinine, Ser: 1.06 mg/dL (ref 0.50–1.35)
GFR calc Af Amer: 86 mL/min — ABNORMAL LOW (ref >90)
Sodium: 124 mEq/L — ABNORMAL LOW (ref 135–145)

## 2012-01-25 LAB — CBC
MCV: 96.7 fL (ref 78.0–100.0)
Platelets: 233 10*3/uL (ref 150–400)
RDW: 12.1 % (ref 11.5–15.5)
WBC: 8.6 10*3/uL (ref 4.0–10.5)

## 2012-01-25 NOTE — Evaluation (Signed)
Physical Therapy Evaluation Patient Details Name: Jesus [name] MRN: [id number] DOB: 1950/10/03 Today's Date: 01/25/2012 Time: [id number] PT Time Calculation (min): 27 min  PT Assessment / Plan / Recommendation Clinical Impression  Pt is a 61 y/o male s/p R TKA. Pt will benefit from acute PT intervention to promote functional mobility for d/c to home.        PT Assessment  Patient needs continued PT services    Follow Up Recommendations  Home health PT;Supervision - Intermittent    Equipment Recommendations  None recommended by PT    Recommendations for Other Services     Frequency 7X/week    Precautions / Restrictions Precautions Precautions: Knee Restrictions Weight Bearing Restrictions: Yes Other Position/Activity Restrictions: PWB 50%   Pertinent Vitals/Pain Pt reporting 2/10 pain in knee at completion of session and resting comfortably in chair.  No pain intervention required.       Mobility  Bed Mobility Bed Mobility: Supine to Sit;Sitting - Scoot to Edge of Bed Supine to Sit: 4: Min guard;HOB flat Sitting - Scoot to [employer] of Bed: 4: Min guard Details for Bed Mobility Assistance: Cues for technique, pt using R UE to assist R LE.   Transfers Transfers: Sit to Stand;Stand to Sit Sit to Stand: 4: Min guard;From bed;With upper extremity assist Stand to Sit: 4: Min assist;To chair/3-in-1 Details for Transfer Assistance: Verbal cues for hand and R LE placement.   Ambulation/Gait Ambulation/Gait Assistance: 4: Min assist Ambulation Distance (Feet): 8 Feet Assistive device: Rolling walker Ambulation/Gait Assistance Details: Verbal and tactile cues for PWB in R LE.  Verbal cueing for gait sequencing.   Gait Pattern: Step-to pattern;Decreased weight shift to right;Decreased stance time - right;Decreased stride length Stairs: No Wheelchair Mobility Wheelchair Mobility: No    Shoulder Instructions     Exercises Total Joint Exercises Ankle Circles/Pumps: Both;10  reps;Seated Quad Sets: 5 reps;Right;AROM;Seated Heel Slides: 5 reps;AROM;Supine;Right Goniometric ROM: 6 degrees short of full extension.     PT Diagnosis: Difficulty walking;Generalized weakness;Acute pain  PT Problem List: Decreased strength;Decreased activity tolerance;Decreased range of motion;Decreased mobility;Decreased knowledge of use of DME;Decreased knowledge of precautions;Pain PT Treatment Interventions: Gait training;Stair training;DME instruction;Functional mobility training;Therapeutic activities;Therapeutic exercise;Patient/family education   PT Goals Acute Rehab PT Goals PT Goal Formulation: With patient Time For Goal Achievement: 02/01/12 Potential to Achieve Goals: Good Pt will go Supine/Side to Sit: with modified independence PT Goal: Supine/Side to Sit - Progress: Goal set today Pt will go Sit to Supine/Side: with modified independence PT Goal: Sit to Supine/Side - Progress: Goal set today Pt will go Sit to Stand: with modified independence PT Goal: Sit to Stand - Progress: Goal set today Pt will go Stand to Sit: with modified independence PT Goal: Stand to Sit - Progress: Goal set today Pt will Transfer Bed to Chair/Chair to Bed: with modified independence PT Transfer Goal: Bed to Chair/Chair to Bed - Progress: Goal set today Pt will Ambulate: 51 - 150 feet;with modified independence;with rolling walker PT Goal: Ambulate - Progress: Goal set today Pt will Go Up / Down Stairs: 3-5 stairs;with supervision PT Goal: Up/Down Stairs - Progress: Goal set today Pt will Perform Home Exercise Program: Independently PT Goal: Perform Home Exercise Program - Progress: Goal set today  Visit Information  Last PT Received On: 01/25/12 Assistance Needed: +1    Subjective Data  Subjective: Agree to PT eval  Patient Stated Goal: Walk without pain.    Prior Functioning  Home Living Lives With: Alone Available Help at  Discharge: Family;Available 24 hours/day (Son) Type of  Home: House Home Access: Stairs to enter [employer] of Steps: 2 Entrance Stairs-Rails: Right;Left;Can reach both Home Layout: One level Bathroom Shower/Tub: Product/process development scientist: Handicapped height Bathroom Accessibility: Yes How Accessible: Accessible via walker Home Adaptive Equipment: Bedside commode/3-in-1;Walker - rolling;Shower chair with back Prior Function Level of Independence: Independent Able to Take Stairs?: Yes Driving: Yes Vocation: On disability Communication Communication: No difficulties Dominant Hand: Right    Cognition  Overall Cognitive Status: Appears within functional limits for tasks assessed/performed Arousal/Alertness: Awake/alert Orientation Level: Appears intact for tasks assessed Behavior During Session: [facility] for tasks performed    Extremity/Trunk Assessment Right Upper Extremity Assessment RUE ROM/Strength/Tone: Within functional levels Left Upper Extremity Assessment LUE ROM/Strength/Tone: Within functional levels Right Lower Extremity Assessment RLE ROM/Strength/Tone: Unable to fully assess;Due to pain Left Lower Extremity Assessment LLE ROM/Strength/Tone: Within functional levels   Balance Balance Balance Assessed: No  End of Session PT - End of Session Equipment Utilized During Treatment: Gait belt Activity Tolerance: Patient limited by fatigue Patient left: in chair;with call bell/phone within reach;with family/visitor present Nurse Communication: Mobility status;Weight bearing status CPM Right Knee CPM Right Knee: Off  GP     Jeananne Bedwell 01/25/2012, 11:14 AM  Collier Salina L. Dao Mearns DPT [phone]

## 2012-01-25 NOTE — Op Note (Signed)
NAMERAINER, SAYARATH                 ACCOUNT NO.:  [id number]  MEDICAL RECORD NO.:  [id number]  LOCATION:  [id number]                        FACILITY:  [facility]  PHYSICIAN:  Vonna Kotyk. Akeel Reffner, M.D.DATE OF BIRTH:  26-Jan-1951  DATE OF PROCEDURE:  01/24/2012 DATE OF DISCHARGE:                              OPERATIVE REPORT   PREOPERATIVE DIAGNOSIS:  End-stage osteoarthritis, right knee.  POSTOPERATIVE DIAGNOSIS:  End-stage osteoarthritis, right knee.  PROCEDURE:  Right total knee replacement.  SURGEON:  Vonna Kotyk. Durward Fortes, MD  ASSISTANT:  Mike Craze. Petrarca, PA-C  ANESTHESIA:  General with supplemental femoral nerve block.  COMPLICATIONS:  None.  COMPONENTS:  DePuy LCS large femoral component, #5 rotating keeled tibial tray, 10 mm polyethylene bridging bearing, a metal-back 3-peg rotating patella, all was secured with polymethyl methacrylate.  PROCEDURE:  Mr. Weigman was met in the holding area, identified the right knee as appropriate operative site.  He did receive a preoperative femoral nerve block by anesthesia.  Mr. Cota was then transported to room #9, placed under general anesthesia without difficulty.  The right lower extremity was placed in a thigh tourniquet.  The leg was then prepped with Betadine scrub and then DuraPrep, the tourniquet to the toes.  Sterile draping was performed.  With the extremities still elevated, it was Esmarch, exsanguinated with a proximal tourniquet at 350 mmHg.  A midline longitudinal incision was made centered about the patella extending to the superior pouch to tibial tubercle.  Via sharp dissection, incision was carried down to subcutaneous tissue.  Gross small bleeders were Bovie coagulated.  First layer of capsule incised in the midline and medial parapatellar incision was then made with the Bovie.  The joint was entered.  There was a clear yellow joint effusion, approximately 20 mL in volume.  The patella was everted 180 degrees laterally,  the knee flexed to 90 degrees.  There was complete absence of articular cartilage in the medial femoral condyle and about the patella with large osteophytes along the medial and lateral femoral condyle.  After removing the osteophytes, we measured a large femoral component. Synovectomy was performed.  First bony cut was made transversely in the proximal tibia using the external tibial guide and the 7 degree angle of declination.  We checked alignment on every bony cut with a external guide with excellent alignment.  Subsequent cuts were then made on the femur using 4 degree distal femoral valgus jig.  The MCL and LCL remained intact throughout the operative procedure.  Flexion and extension gaps were symmetrical at 10 mm.  Lamina spreader was inserted in the medial lateral compartment and removed medial and lateral menisci as well as ACL and PCL.  Osteophytes removed from the posterior femoral condyle using a 3/4-inch curved osteotome.  There were at least 4 osteocartilaginous loose bodies that were removed from the posterolateral and posteromedial compartment.  Final bony cuts were then made.  The finishing guide to taper the femoral condyles and to obtain the center holes.  Retractors were placed around the tibia that was advanced anteriorly and measured a #5 tibial tray.  This was pinned in place.  Center hole was made followed by the keeled cut.  With the tibial jig in place, a 10-mm bridging bearing trial was inserted followed by the large femoral component.  The entire construct was reduced through full range of motion.  It was perfectly stable.  There was no malrotation of the tibial components and no instability.  Patella was prepared by removing approximately 10 mm of bone leaving 13 mm of patella thickness.  The trial jig was then applied, 3 holes made, and the trial patella inserted through full range of motion and was perfectly stable.  Trial components were then  removed.  The joint was copiously irrigated with saline solution.  The final components were then impacted with polymethyl methacrylate.  Initially inserted the metallic tibial component with methacrylate followed by the 10-mm bridging bearing, then the large femoral component.  The knee was placed in extension with excellent fit of the components.  Extraneous methacrylate was removed from the periphery of the components.  Patella was applied with methacrylate and the bone clamp.  While waiting for the methacrylate to mature, the joint was injected with 0.25% Marcaine without epinephrine.  At approximately 15 minutes, the methacrylate had hardened.  The joint was again irrigated with saline solution.  Tourniquet was deflated to 83 minutes.  Bleeders were Bovie coagulated.  We then sprayed thrombin around the wound.  A medium-size Hemovac was inserted.  We had nice hemostasis.  Deep capsule was closed with interrupted #1 Ethibond, superficial capsule closed with running 0 Vicryl, subcu with 3-0 Monocryl.  Skin was closed with skin clips.  Sterile bulky dressing was applied followed by the patient's support stocking.  The patient tolerated the procedure well without complications.     Vonna Kotyk. Durward Fortes, M.D.     PWW/MEDQ  D:  01/24/2012  T:  01/25/2012  Job:  [id number]

## 2012-01-25 NOTE — Progress Notes (Signed)
Occupational Therapy Evaluation Patient Details Name: Jesus [name] MRN: [id number] DOB: [birth year] Today's Date: 01/25/2012 Time: 1452-1510 OT Time Calculation (min): 18 min  OT Assessment / Plan / Recommendation Clinical Impression  61 yo s/p R TKA. PWB - 50%. Pt lives alone, but pt's son is going to stay with him for 2 weeks while he recooperates. Pt will benefit from skilled OT services to max independence with ADL and functional moiblity for ADL to facilitate D/C home with S of son. Will most likely not need OT after D/C.    OT Assessment  Patient needs continued OT Services    Follow Up Recommendations  No OT follow up    Barriers to Discharge None    Equipment Recommendations  None recommended by OT    Recommendations for Other Services  none  Frequency  Min 2X/week    Precautions / Restrictions Precautions Precautions: Knee Precaution Comments: Reviewed R knee positioning education.  Restrictions Weight Bearing Restrictions: Yes Other Position/Activity Restrictions: PWB 50%   Pertinent Vitals/Pain 3. knee    ADL  Grooming: Set up Upper Body Bathing: Set up Lower Body Bathing: Moderate assistance Upper Body Dressing: Set up Lower Body Dressing: Moderate assistance Toilet Transfer: Holiday representative: Bedside commode Transfers/Ambulation Related to ADLs: supervision. demonstrated technique for backing up to tub for transfer ADL Comments: Discussed available AE for LB ADL. Pt will benefit from reacher , sock aid and long handled sponge.    OT Diagnosis: Generalized weakness;Acute pain  OT Problem List: Decreased strength;Decreased range of motion;Decreased safety awareness;Decreased knowledge of use of DME or AE;Decreased knowledge of precautions;Pain OT Treatment Interventions: Self-care/ADL training;Energy conservation;DME and/or AE instruction;Therapeutic activities;Patient/family education   OT Goals Acute Rehab OT Goals OT Goal  Formulation: With patient Time For Goal Achievement: 03/09/12 Potential to Achieve Goals: Good ADL Goals Pt Will Perform Lower Body Bathing: with supervision;with caregiver independent in assisting;Sit to stand from chair;with adaptive equipment;Unsupported ADL Goal: Lower Body Bathing - Progress: Goal set today Pt Will Perform Lower Body Dressing: with supervision;with caregiver independent in assisting;Sit to stand from chair;Unsupported;with adaptive equipment ADL Goal: Lower Body Dressing - Progress: Goal set today Pt Will Transfer to Toilet: with modified independence;with DME;Ambulation;3-in-1;Maintaining weight bearing status ADL Goal: Toilet Transfer - Progress: Goal set today Pt Will Perform Tub/Shower Transfer: Tub transfer;Ambulation;with DME;Maintaining weight bearing status;Other (comment) (back up to tub using 3 in 1) ADL Goal: Tub/Shower Transfer - Progress: Goal set today  Visit Information  Last OT Received On: 01/25/12 Assistance Needed: +1    Subjective Data      Prior Functioning     Home Living Lives With: Alone Available Help at Discharge: Family;Available 24 hours/day Type of Home: House Home Access: Stairs to enter [employer] of Steps: 2 Entrance Stairs-Rails: Right;Left;Can reach both Home Layout: One level Bathroom Shower/Tub: Product/process development scientist: Handicapped height Bathroom Accessibility: Yes How Accessible: Accessible via walker Home Adaptive Equipment: Bedside commode/3-in-1;Walker - rolling;Shower chair with back Prior Function Level of Independence: Independent Able to Take Stairs?: Yes Driving: Yes Vocation: On disability Communication Communication: No difficulties Dominant Hand: Right         Vision/Perception  WFL   Cognition  Overall Cognitive Status: Appears within functional limits for tasks assessed/performed Arousal/Alertness: Awake/alert Orientation Level: Appears intact for tasks  assessed Behavior During Session: [facility] for tasks performed    Extremity/Trunk Assessment Right Upper Extremity Assessment RUE ROM/Strength/Tone: [facility] for tasks assessed RUE Sensation: WFL - Light Touch;WFL - Proprioception RUE Coordination:  WFL - gross/fine motor Left Upper Extremity Assessment LUE ROM/Strength/Tone: Within functional levels LUE Sensation: WFL - Light Touch;WFL - Proprioception LUE Coordination: WFL - gross/fine motor Right Lower Extremity Assessment RLE ROM/Strength/Tone: Deficits;Due to precautions;Due to pain Left Lower Extremity Assessment LLE ROM/Strength/Tone: WFL for tasks assessed LLE Sensation: WFL - Light Touch;WFL - Proprioception LLE Coordination: WFL - gross/fine motor Trunk Assessment Trunk Assessment: Normal     Mobility Bed Mobility Bed Mobility: Supine to Sit Supine to Sit: 6: Modified independent (Device/Increase time) Sitting - Scoot to Edge of Bed: 6: Modified independent (Device/Increase time) Sit to Supine: 6: Modified independent (Device/Increase time) Details for Bed Mobility Assistance: Cues for technique, pt using R UE to assist R LE.   Transfers Sit to Stand: 5: Supervision;6: Modified independent (Device/Increase time);From bed;From chair/3-in-1;With upper extremity assist Stand to Sit: 5: Supervision;To chair/3-in-1;With upper extremity assist Details for Transfer Assistance: S               Balance  WFL   End of Session OT - End of Session Activity Tolerance: Patient tolerated treatment well Patient left: in bed;with call bell/phone within reach;Other (comment) (resumed CPM) Nurse Communication: Mobility status;Precautions;Weight bearing status CPM Right Knee CPM Right Knee: On Right Knee Flexion (Degrees): 60  Right Knee Extension (Degrees): 0   GO     Meyah Corle,HILLARY 01/25/2012, 3:19 PM [facility], OTR/L  [phone] 01/25/2012

## 2012-01-25 NOTE — Progress Notes (Signed)
CARE MANAGEMENT NOTE 01/25/2012  Patient:  Jesus [name], Jesus [name]   Account Number:  [id number]  Date Initiated:  01/25/2012  Documentation initiated by:  Ricki Miller  Subjective/Objective Assessment:   61 yr old male s/p right total knee arthroplasty.     Action/Plan:   CM spoke with concerning home health and DME needs. Choice offered. CPM, rolling walker and 3in1 to be delivered to patient's home.   Anticipated DC Date:  01/26/2012   Anticipated DC Plan:  [place]  CM consult      PAC Choice  [place]   Choice offered to / List presented to:  C-1 Patient   DME arranged  3-N-1  [place]  CPM      DME agency  [employer]     [place] arranged  HH-2 PT      [place].   Status of service:  Completed, signed off Medicare Important Message given?   (If response is "NO", the following Medicare IM given date fields will be blank) Date Medicare IM given:   Date Additional Medicare IM given:    Discharge Disposition:  [place]  Per UR Regulation:    If discussed at Long Length of Stay Meetings, dates discussed:    Comments:

## 2012-01-25 NOTE — Progress Notes (Signed)
Physical Therapy Treatment Patient Details Name: Jesus [name] MRN: [id number] DOB: [birth year] Today's Date: 01/25/2012 Time: [id number] PT Time Calculation (min): 31 min  PT Assessment / Plan / Recommendation Comments on Treatment Session  Pt mobility improved from a.m. session.  Should meet acute PT goals tomorrow.     Follow Up Recommendations  Home health PT;Supervision - Intermittent     Equipment Recommendations  None recommended by PT    Recommendations for Other Services    Frequency 7X/week   Plan Discharge plan remains appropriate;Frequency remains appropriate    Precautions / Restrictions Precautions Precautions: Knee Precaution Comments: Reviewed R knee positioning education.  Restrictions Weight Bearing Restrictions: Yes Other Position/Activity Restrictions: PWB 50%   Pertinent Vitals/Pain Pt with no c/o pain.     Mobility  Bed Mobility Bed Mobility: Supine to Sit;Sit to Supine Supine to Sit: 6: Modified independent (Device/Increase time) Sitting - Scoot to Edge of Bed: 6: Modified independent (Device/Increase time) Sit to Supine: 6: Modified independent (Device/Increase time) Details for Bed Mobility Assistance: Cues for technique, pt using R UE to assist R LE.   Transfers Transfers: Sit to Stand;Stand to Sit Sit to Stand: 5: Supervision;6: Modified independent (Device/Increase time);From bed;From chair/3-in-1;With upper extremity assist Stand to Sit: 5: Supervision;To chair/3-in-1;With upper extremity assist Details for Transfer Assistance: Verbal cues for hand and R LE placement.   Ambulation/Gait Ambulation/Gait Assistance: 5: Supervision Ambulation Distance (Feet): 50 Feet Assistive device: Rolling walker Ambulation/Gait Assistance Details: Verbal cues to decrease distance from walker for improved use of UEs.   Cues to increase gait speed.   Gait Pattern: Step-to pattern;Decreased weight shift to right;Decreased stance time - right;Decreased stride  length Gait velocity: <1 ft/sec.  Stairs: No Wheelchair Mobility Wheelchair Mobility: No    Exercises     PT Diagnosis:    PT Problem List:   PT Treatment Interventions:     PT Goals Acute Rehab PT Goals PT Goal Formulation: With patient Time For Goal Achievement: 02/01/12 Potential to Achieve Goals: Good Pt will go Supine/Side to Sit: with modified independence PT Goal: Supine/Side to Sit - Progress: Met Pt will go Sit to Supine/Side: with modified independence PT Goal: Sit to Supine/Side - Progress: Met Pt will go Sit to Stand: with modified independence PT Goal: Sit to Stand - Progress: Met Pt will go Stand to Sit: with modified independence PT Goal: Stand to Sit - Progress: Progressing toward goal Pt will Transfer Bed to Chair/Chair to Bed: with modified independence PT Transfer Goal: Bed to Chair/Chair to Bed - Progress: Progressing toward goal Pt will Ambulate: 51 - 150 feet;with modified independence;with rolling walker PT Goal: Ambulate - Progress: Progressing toward goal Pt will Go Up / Down Stairs: 3-5 stairs;with supervision PT Goal: Up/Down Stairs - Progress: Not met Pt will Perform Home Exercise Program: Independently PT Goal: Perform Home Exercise Program - Progress: Not met  Visit Information  Last PT Received On: 01/25/12 Assistance Needed: +1    Subjective Data  Patient Stated Goal: Walk without pain.    Cognition  Overall Cognitive Status: Appears within functional limits for tasks assessed/performed Arousal/Alertness: Awake/alert Orientation Level: Appears intact for tasks assessed Behavior During Session: [facility] for tasks performed    Balance     End of Session PT - End of Session Equipment Utilized During Treatment: Gait belt Activity Tolerance: Patient tolerated treatment well Patient left: in bed;in CPM;with call bell/phone within reach Nurse Communication: Mobility status;Weight bearing status CPM Right Knee CPM Right Knee: On Right  Knee  Flexion (Degrees): 60  Right Knee Extension (Degrees): 0    GP     Farris Geiman 01/25/2012, 2:51 PM Oluwasemilore Pascuzzi L. Avrum Kimball DPT [phone]

## 2012-01-25 NOTE — Progress Notes (Signed)
UR COMPLETED  

## 2012-01-25 NOTE — Progress Notes (Signed)
Patient ID: RACE HIDER, male   DOB: 01/31/1951, 61 y.o.   MRN: [id number] PATIENT ID: Jesus [name]        MRN:  [id number]          DOB/AGE: 04-20-1950 / 61 y.o.    [name], MD   Biagio Borg, PA-C [address] [place], [place]  [address]                             [phone]   PROGRESS NOTE 2Subjective:  negative for Chest Pain  negative for Shortness of Breath  negative for Nausea/Vomiting   negative for Calf Pain    Tolerating Diet: yes         Patient reports pain as mild.       Objective: Vital signs in last 24 hours:   Patient Vitals for the past 24 hrs:  BP Temp Temp src Pulse Resp SpO2  01/25/12 0600 104/54 mmHg 97.5 F (36.4 C) - 66  16  99 %  01/25/12 0400 - - - - 18  98 %  01/25/12 0000 - - - - 18  99 %  01/24/12 2140 109/48 mmHg 98.4 F (36.9 C) Oral 63  20  99 %  01/24/12 2000 - - - - 14  96 %  01/24/12 1511 129/68 mmHg 98 F (36.7 C) Oral 70  - 96 %  01/24/12 1446 - - - 68  14  100 %  01/24/12 1445 - - - 68  12  99 %  01/24/12 1444 - - - 70  13  99 %  01/24/12 1443 - - - 66  8  99 %  01/24/12 1442 - - - 67  11  100 %  01/24/12 1441 - - - 68  10  98 %  01/24/12 1440 - - - 65  8  99 %  01/24/12 1439 - - - 67  9  97 %  01/24/12 1438 - - - 66  9  98 %  01/24/12 1437 - - - 65  12  98 %  01/24/12 1436 - - - 66  9  99 %  01/24/12 1435 - - - 66  15  98 %  01/24/12 1434 - - - 62  12  100 %  01/24/12 1433 - - - 67  16  98 %  01/24/12 1432 - - - 66  13  99 %  01/24/12 1431 - - - 67  19  99 %  01/24/12 1430 - - - 67  17  99 %  01/24/12 1429 - - - 67  10  98 %  01/24/12 1428 - - - 65  8  100 %  01/24/12 1427 - - - 70  16  100 %  01/24/12 1426 - - - 64  12  100 %  01/24/12 1425 - - - 64  10  99 %  01/24/12 1424 134/68 mmHg - - 66  9  98 %  01/24/12 1423 - - - 67  14  100 %  01/24/12 1422 - - - 67  12  98 %  01/24/12 1421 78/53 mmHg - - 63  8  100 %  01/24/12 1420 - - - 66  15  100 %  01/24/12 1419 - - - 67  13  100 %  01/24/12 1418 - - -  69  15  100 %  01/24/12 1417 - - - 65  8  100 %  01/24/12 1416 - - - 64  14  100 %  01/24/12 1415 - - - 66  10  100 %  01/24/12 1414 - - - 67  10  100 %  01/24/12 1413 - - - 67  12  100 %  01/24/12 1412 - - - 65  11  100 %  01/24/12 1411 - - - 66  11  100 %  01/24/12 1410 - - - 65  19  100 %  01/24/12 1409 - - - 67  17  100 %  01/24/12 1408 - - - 66  15  100 %  01/24/12 1407 - - - 64  11  100 %  01/24/12 1406 - - - 66  11  100 %  01/24/12 1405 - - - 67  17  100 %  01/24/12 1404 - - - 68  14  100 %  01/24/12 1403 - - - 65  16  100 %  01/24/12 1402 - - - 66  20  100 %  01/24/12 1401 - - - 66  13  100 %  01/24/12 1400 - - - 66  14  100 %  01/24/12 1359 - - - 64  11  100 %  01/24/12 1358 - - - 65  12  100 %  01/24/12 1357 - - - 65  12  100 %  01/24/12 1356 - - - 65  12  100 %  01/24/12 1355 - - - 71  14  100 %  01/24/12 1354 - - - 66  14  100 %  01/24/12 1353 - - - 71  16  100 %  01/24/12 1352 - - - 69  13  100 %  01/24/12 1351 - - - 67  10  100 %  01/24/12 1350 - - - 68  14  100 %  01/24/12 1349 - - - 65  13  100 %  01/24/12 1348 - - - 67  13  100 %  01/24/12 1347 - - - 64  8  100 %  01/24/12 1346 - - - 62  9  100 %  01/24/12 1345 - - - 65  14  100 %  01/24/12 1344 - - - 63  10  100 %  01/24/12 1343 - - - 65  12  100 %  01/24/12 1342 - - - 63  9  100 %  01/24/12 1341 - - - 61  8  100 %  01/24/12 1340 - - - 61  8  100 %  01/24/12 1339 - - - 64  12  100 %  01/24/12 1338 - - - 63  12  100 %  01/24/12 1337 - - - 62  10  100 %  01/24/12 1336 - - - 62  9  100 %  01/24/12 1335 - - - 61  8  100 %  01/24/12 1334 - - - 61  10  100 %  01/24/12 1333 - - - 62  10  100 %  01/24/12 1332 - - - 60  10  100 %  01/24/12 1331 - - - 63  9  100 %  01/24/12 1330 - - - 63  10  100 %  01/24/12 1329 - - - 60  8  100 %  01/24/12 1328 - - - 62  8  100 %    01/24/12 1327 - - - 62  10  100 %  01/24/12 1326 - - - 60  8  100 %  01/24/12 1325 - - - 63  10  100 %  01/24/12 1324 - - - 63  11  100 %    01/24/12 1323 - - - 63  10  100 %  01/24/12 1322 - - - 58  9  100 %  01/24/12 1321 124/67 mmHg - - 65  11  100 %  01/24/12 1320 - - - 60  11  100 %  01/24/12 1319 - - - 64  15  100 %  01/24/12 1318 - - - 60  10  100 %  01/24/12 1317 - - - 61  12  100 %  01/24/12 1316 - - - 64  15  100 %  01/24/12 1315 - 97 F (36.1 C) - 63  12  100 %  01/24/12 1314 - - - 65  16  100 %  01/24/12 1313 - - - 63  14  100 %  01/24/12 1312 - - - 63  12  100 %  01/24/12 1311 - - - 65  14  100 %  01/24/12 1310 - - - 71  11  100 %  01/24/12 1309 - - - 61  13  100 %  01/24/12 1308 - - - 64  14  100 %  01/24/12 1307 - - - 63  13  100 %  01/24/12 1306 132/72 mmHg - - 63  11  100 %  01/24/12 1305 - - - 65  13  100 %  01/24/12 1304 - - - 65  20  100 %  01/24/12 1303 - - - 63  15  100 %  01/24/12 1302 - - - 64  10  100 %  01/24/12 1301 - - - 64  12  100 %  01/24/12 1300 - - - 67  16  100 %  01/24/12 1259 - - - 69  16  100 %  01/24/12 1258 - - - 64  12  100 %  01/24/12 1257 - - - 64  12  100 %  01/24/12 1256 - - - 65  14  100 %  01/24/12 1255 - - - 64  12  100 %  01/24/12 1254 - - - 65  13  100 %  01/24/12 1253 - - - 64  14  98 %  01/24/12 1252 - - - 64  16  100 %  01/24/12 1251 131/86 mmHg - - 64  14  100 %  01/24/12 1250 - - - 64  12  99 %  01/24/12 1249 - - - 62  13  100 %  01/24/12 1248 - - - 64  12  100 %  01/24/12 1247 - - - 64  13  100 %  01/24/12 1246 - - - 68  15  100 %  01/24/12 1245 - - - 91  19  100 %  01/24/12 1244 - - - 74  18  100 %  01/24/12 1243 - - - 67  16  100 %  01/24/12 1242 - - - 68  16  100 %  01/24/12 1241 - - - 71  15  100 %  01/24/12 1240 - - - 70  14  100 %  01/24/12 1239 - - - 70  14  100 %  01/24/12 1238 - - -  72  16  100 %  01/24/12 1237 - - - 71  13  100 %  01/24/12 1236 137/98 mmHg - - 69  22  98 %  01/24/12 1230 - 97.3 F (36.3 C) - 67  - 100 %  01/24/12 0818 - - - 68  - 100 %      Intake/Output from previous day:   11/26 0701 - 11/27 0700 In: 1980  [P.O.:480; I.V.:1500] Out: 3125 [Urine:2100; Drains:925]   Intake/Output this shift:       Intake/Output      11/26 0701 - 11/27 0700 11/27 0701 - 11/28 0700   P.O. 480    I.V. 1500    Total Intake 1980    Urine 2100    Drains 925    Blood 100    Total Output 3125    Net -1145            LABORATORY DATA:  Basename 01/25/12 0630 01/18/12 1001  WBC 8.6 8.5  HGB 10.6* 15.7  HCT 29.7* 42.5  PLT 233 285    Basename 01/25/12 0630 01/24/12 0705 01/18/12 1001  NA 124* 126* 122*  K 4.2 4.3 4.8  CL 90* 90* 85*  CO2 29 30 29   BUN 13 9 10   CREATININE 1.06 1.02 0.91  GLUCOSE 107* 99 94  CALCIUM 8.2* 9.7 9.6   Lab Results  Component Value Date   INR 0.95 01/18/2012    Recent Radiographic Studies :   Chest 2 View  01/18/2012  *RADIOLOGY REPORT*  Clinical Data: Hypertension, preoperative evaluation  CHEST - 2 VIEW  Comparison: None.  Findings: The heart and pulmonary vascularity are within normal limits.  The lungs are clear bilaterally.  No acute infiltrate is seen.  No bony abnormality is noted.  IMPRESSION: No acute abnormality noted.   Original Report Authenticated By: Inez Catalina, M.D.      Examination:  General appearance: alert, cooperative and no distress  Wound Exam: clean, dry, intact   Drainage:  Moderate amount Serosanguinous exudate-200cc in hemovac last shift,keep in place until am  Motor Exam: EHL, FHL, Anterior Tibial and Posterior Tibial Intact  Sensory Exam: Superficial Peroneal, Deep Peroneal and Tibial normal  Vascular Exam: Normal  Assessment:    1 Day Post-Op  Procedure(s) (LRB): TOTAL KNEE ARTHROPLASTY (Right)  ADDITIONAL DIAGNOSIS:  Active Problems:  * No active hospital problems. *   Hyponatremia-chronic,monitor   Plan: Physical Therapy as ordered Partial Weight Bearing @ 50% (PWB)  DVT Prophylaxis:  Xarelto  DISCHARGE PLAN: Home  DISCHARGE NEEDS: HHPT and Walker   OOB with PT,D/C hemovac in am, home Thursday or Friday, has  had low sodium for a long time-asymptomatic     Rashied Corallo W 01/25/2012 7:59 AM

## 2012-01-25 NOTE — Progress Notes (Signed)
Referral received for SNF. Chart reviewed and CSW has spoken with RNCM who indicates that patient is for DC to home with Home Health and DME.  CSW to sign off. Please re-consult if CSW needs arise.  Lorie Phenix. [place], [place]

## 2012-01-26 DIAGNOSIS — D62 Acute posthemorrhagic anemia: Secondary | ICD-10-CM | POA: Diagnosis not present

## 2012-01-26 DIAGNOSIS — E871 Hypo-osmolality and hyponatremia: Secondary | ICD-10-CM | POA: Diagnosis present

## 2012-01-26 DIAGNOSIS — M1711 Unilateral primary osteoarthritis, right knee: Secondary | ICD-10-CM | POA: Diagnosis present

## 2012-01-26 DIAGNOSIS — M1712 Unilateral primary osteoarthritis, left knee: Secondary | ICD-10-CM | POA: Diagnosis present

## 2012-01-26 LAB — CBC
HCT: 25.7 % — ABNORMAL LOW (ref 39.0–52.0)
Platelets: 223 10*3/uL (ref 150–400)
RDW: 12.1 % (ref 11.5–15.5)
WBC: 8.4 10*3/uL (ref 4.0–10.5)

## 2012-01-26 LAB — BASIC METABOLIC PANEL
BUN: 8 mg/dL (ref 6–23)
Chloride: 95 mEq/L — ABNORMAL LOW (ref 96–112)
GFR calc Af Amer: >90 mL/min (ref >90)
Potassium: 4.1 mEq/L (ref 3.5–5.1)

## 2012-01-26 MED ORDER — OXYCODONE-ACETAMINOPHEN 5-325 MG PO TABS: 1.0000 | ORAL_TABLET | Freq: Four times a day (QID) | ORAL | Status: DC | PRN

## 2012-01-26 MED ORDER — RIVAROXABAN 10 MG PO TABS: 10.0000 mg | ORAL_TABLET | ORAL | Status: DC

## 2012-01-26 MED ORDER — METHOCARBAMOL 500 MG PO TABS: 500.0000 mg | ORAL_TABLET | Freq: Four times a day (QID) | ORAL | Status: DC | PRN

## 2012-01-26 NOTE — Progress Notes (Signed)
Physical Therapy Treatment Patient Details Name: Jesus [name] MRN: [id number] DOB: 1950-08-11 Today's Date: 01/26/2012 Time: 0920-0952 PT Time Calculation (min): 32 min  PT Assessment / Plan / Recommendation Comments on Treatment Session  Pt cont's to progress with mobility & PT goals at this date.  Pt reports RLE feeling "stiff" this AM but improved as session cont'd.  Pt was able to perform stair training this AM & did well.  He reports his son will be with him & be able to (A) as needed.      Follow Up Recommendations  Home health PT;Supervision - Intermittent     Does the patient have the potential to tolerate intense rehabilitation     Barriers to Discharge        Equipment Recommendations  None recommended by OT    Recommendations for Other Services    Frequency 7X/week   Plan Discharge plan remains appropriate;Frequency remains appropriate    Precautions / Restrictions Precautions Precautions: Knee Restrictions Weight Bearing Restrictions: Yes Other Position/Activity Restrictions: PWB 50%   Pertinent Vitals/Pain 1/10  Rt knee.  Premedicated.      Mobility  Bed Mobility Bed Mobility: Supine to Sit;Sitting - Scoot to Edge of Bed Supine to Sit: 6: Modified independent (Device/Increase time) Sitting - Scoot to Edge of Bed: 6: Modified independent (Device/Increase time) Details for Bed Mobility Assistance: Pt initially with difficulty moving RLE but then was able to use hooking technique to manage LE to EOB/OOB.   Transfers Transfers: Sit to Stand;Stand to Sit Sit to Stand: With upper extremity assist;With armrests;From bed;From chair/3-in-1;4: Min guard Stand to Sit: 4: Min guard;With upper extremity assist;With armrests;To chair/3-in-1 Details for Transfer Assistance: Reinforcement cues for safe hand placement.  Pt did well with positioning RLE before sit<>stand Ambulation/Gait Ambulation/Gait Assistance: 5: Supervision Ambulation Distance (Feet): 80 Feet (30' + 23'   ) Assistive device: Rolling walker Ambulation/Gait Assistance Details: Pt with slow & small steps but improved with distance.  Cues for terminal knee ext. initially but this also improved.   Gait Pattern: Step-to pattern;Decreased step length - left;Decreased step length - right;Decreased stance time - right;Decreased weight shift to right Stairs: Yes Stairs Assistance: 4: Min guard Stairs Assistance Details (indicate cue type and reason): cues for sequencing & technique.   Stair Management Technique: Two rails;Step to pattern;Forwards Number of Stairs: 3  (2x's. ) Wheelchair Mobility Wheelchair Mobility: No      PT Goals Acute Rehab PT Goals Time For Goal Achievement: 02/01/12 Potential to Achieve Goals: Good Pt will go Supine/Side to Sit: with modified independence PT Goal: Supine/Side to Sit - Progress: Met Pt will go Sit to Supine/Side: with modified independence Pt will go Sit to Stand: with modified independence PT Goal: Sit to Stand - Progress: Not met Pt will go Stand to Sit: with modified independence PT Goal: Stand to Sit - Progress: Not met Pt will Transfer Bed to Chair/Chair to Bed: with modified independence Pt will Ambulate: 51 - 150 feet;with modified independence;with rolling walker PT Goal: Ambulate - Progress: Progressing toward goal Pt will Go Up / Down Stairs: 3-5 stairs;with supervision PT Goal: Up/Down Stairs - Progress: Progressing toward goal Pt will Perform Home Exercise Program: Independently  Visit Information  Last PT Received On: 01/26/12 Assistance Needed: +1    Subjective Data      Cognition  Overall Cognitive Status: Appears within functional limits for tasks assessed/performed Arousal/Alertness: Awake/alert Orientation Level: Appears intact for tasks assessed Behavior During Session: [facility] for tasks performed  Balance     End of Session PT - End of Session Equipment Utilized During Treatment: Gait belt Activity Tolerance: Patient  tolerated treatment well Patient left: in chair;with call bell/phone within reach     [place], [place] [phone] 01/26/2012

## 2012-01-26 NOTE — Progress Notes (Signed)
Occupational Therapy Treatment Patient Details Name: Jesus [name] MRN: [id number] DOB: Mar 13, 1950 Today's Date: 01/26/2012 Time: [id number] OT Time Calculation (min): 29 min  OT Assessment / Plan / Recommendation Comments on Treatment Session Pt progressing well with therapy. Ambulating with supervision and able to perform tub/shower transfer with Min A this session.     Follow Up Recommendations  No OT follow up    Barriers to Discharge       Equipment Recommendations  None recommended by OT    Recommendations for Other Services    Frequency     Plan      Precautions / Restrictions Precautions Precautions: Knee Restrictions Weight Bearing Restrictions: Yes Other Position/Activity Restrictions: PWB 50%   Pertinent Vitals/Pain Pt reports 1-2/10 Rt knee pain and has received pain medicine recently    ADL  Lower Body Bathing: Minimal assistance Where Assessed - Lower Body Bathing: Unsupported sit to stand Tub/Shower Transfer: Performed;Minimal assistance Tub/Shower Transfer Method: Ambulating (sideways entry/exit) Tub/Shower Transfer Equipment:  (3n1 in tub) Equipment Used: Gait belt;Rolling walker Transfers/Ambulation Related to ADLs: supervision with ambulation with RW as well as sit to stand ADL Comments: Pt educated on tub/shower entry/exit as well as use of 3n1 in shower with outer legs on outside of tub. Pt required Min A to bring RLE into tub- pt able to verbalize instructions to do so for son to be able to help.    OT Diagnosis:    OT Problem List:   OT Treatment Interventions:     OT Goals ADL Goals Pt Will Perform Lower Body Bathing: with supervision;with caregiver independent in assisting;Sit to stand from chair;with adaptive equipment;Unsupported ADL Goal: Lower Body Bathing - Progress: Progressing toward goals Pt Will Perform Lower Body Dressing: with supervision;with caregiver independent in assisting;Sit to stand from chair;Unsupported;with adaptive  equipment Pt Will Transfer to Toilet: with modified independence;with DME;Ambulation;3-in-1;Maintaining weight bearing status ADL Goal: Toilet Transfer - Progress: Progressing toward goals Pt Will Perform Tub/Shower Transfer: Tub transfer;Ambulation;with DME;Maintaining weight bearing status;Other (comment) ADL Goal: Tub/Shower Transfer - Progress: Progressing toward goals  Visit Information  Last OT Received On: 01/26/12 Assistance Needed: +1    Subjective Data      Prior Functioning       Cognition  Overall Cognitive Status: Appears within functional limits for tasks assessed/performed Arousal/Alertness: Awake/alert Orientation Level: Appears intact for tasks assessed Behavior During Session: [facility] for tasks performed    Mobility  Shoulder Instructions Bed Mobility Bed Mobility: Supine to Sit;Sitting - Scoot to Edge of Bed Supine to Sit: 6: Modified independent (Device/Increase time);HOB flat Sitting - Scoot to Edge of Bed: 7: Independent Details for Bed Mobility Assistance: cues to hook LLE under RLE to assist bringing OOB Transfers Sit to Stand: With upper extremity assist;With armrests;From bed;From chair/3-in-1;4: Min guard Stand to Sit: 4: Min guard;With upper extremity assist;With armrests;To chair/3-in-1 Details for Transfer Assistance: Reinforcement cues for safe hand placement.  Pt did well with positioning RLE before sit<>stand       Exercises      Balance     End of Session OT - End of Session Equipment Utilized During Treatment: Gait belt Activity Tolerance: Patient tolerated treatment well Patient left: in bed;with call bell/phone within reach;Other (comment) Nurse Communication: Mobility status  GO     Tauri Ethington 01/26/2012, 10:11 AM

## 2012-01-26 NOTE — Discharge Summary (Signed)
[name], MD   Biagio Borg, PA-C [address] [place], [place]  [address]                             [phone]  PATIENT ID: Jesus [name]        MRN:  [id number]          DOB/AGE: 05-05-1950 / 61 y.o.    DISCHARGE SUMMARY  ADMISSION DATE:    01/24/2012 DISCHARGE DATE:   01/30/2012   ADMISSION DIAGNOSIS: END STAGE OA RIGHT KNEE    DISCHARGE DIAGNOSIS:  END STAGE OA RIGHT KNEE    ADDITIONAL DIAGNOSIS: Principal Problem:  *Osteoarthritis of knee Active Problems:  Anemia associated with acute blood loss  Postoperative anemia due to acute blood loss  Hyponatremia  Past Medical History  Diagnosis Date  . Hypertension   . Anxiety   . Mental disorder   . Arthritis   . Dermatitis     scaly bump occurs every few months, pt uses cortizone cream and it goes away    PROCEDURE: Procedure(s): TOTAL KNEE ARTHROPLASTY RIGHT on 01/24/2012   CONSULTS: NONE  HISTORY: Jesus [name] is a very pleasant 61 year old white male who is now widowed who has been having problems with his knees. He has most recently been out of work for at least 7 months and has found out that there is no longer a job for him. He has been on disability through his company though. He has been having pain in the right knee now for at least 6 months and has been having pain with activities of daily living, as well as pain with every step. It is now to the point where he is having severe pain which is more of an aching, throbbing pain with occasional sharpness in the right knee. He has had numerous aspirations of both knees with corticosteroid injection as well as viscosupplementation. He has most recently also had a corticosteroid injection but this is not proving very effective. His pain is now pretty much constant and is bothering him to the point where he cannot sleep at night secondary to the pain.    HOSPITAL COURSE:  Jesus [name] is a 61 y.o. admitted on 01/24/2012 and found to have a diagnosis of END  STAGE OA RIGHT KNEE.  After appropriate laboratory studies were obtained  they were taken to the operating room on 01/24/2012 and underwent  Procedure(s): TOTAL KNEE ARTHROPLASTY RIGHT They were given perioperative antibiotics:  Anti-infectives     Start     Dose/Rate Route Frequency Ordered Stop   01/24/12 1600   ceFAZolin (ANCEF) IVPB 2 g/50 mL premix        2 g 100 mL/hr over 30 Minutes Intravenous Every 6 hours 01/24/12 1521 01/24/12 2315   01/23/12 1435   ceFAZolin (ANCEF) 3 g in dextrose 5 % 50 mL IVPB        3 g 160 mL/hr over 30 Minutes Intravenous 60 min pre-op 01/23/12 1435 01/24/12 1006        .  Tolerated the procedure well.  Placed with a foley intraoperatively.  Given Ofirmev at induction and for 48 hours.    POD #1, allowed out of bed to a chair.  PT for ambulation and exercise program.  Foley D/C'd in morning.  IV saline locked.  O2 discontionued.  POD #2, continued PT and ambulation.  Hemovac pulled. Dressing changed. . The remainder of the hospital  course was dedicated to ambulation and strengthening.   The patient was discharged on 6 Days Post-Op in  Stable condition.  Blood products given: NONE   DIAGNOSTIC STUDIES: Recent vital signs: No data found.      Recent laboratory studies:  Basename 01/26/12 0532 04-Feb-2012 0630  WBC 8.4 8.6  HGB 9.2* 10.6*  HCT 25.7* 29.7*  PLT 223 233    Basename 01/26/12 0532 02/04/2012 0630 01/24/12 0705  NA 131* 124* 126*  K 4.1 4.2 4.3  CL 95* 90* 90*  CO2 32 29 30  BUN 8 13 9   CREATININE 1.01 1.06 1.02  GLUCOSE 106* 107* 99  CALCIUM 8.5 8.2* 9.7   Lab Results  Component Value Date   INR 0.95 01/18/2012     Recent Radiographic Studies :   Chest 2 View  01/18/2012  *RADIOLOGY REPORT*  Clinical Data: Hypertension, preoperative evaluation  CHEST - 2 VIEW  Comparison: None.  Findings: The heart and pulmonary vascularity are within normal limits.  The lungs are clear bilaterally.  No acute infiltrate is seen.  No  bony abnormality is noted.  IMPRESSION: No acute abnormality noted.   Original Report Authenticated By: Inez Catalina, M.D.     DISCHARGE INSTRUCTIONS:     Discharge Orders    Future Orders Please Complete By Expires   Diet - low sodium heart healthy      Call MD / Call 911      Comments:   If you experience chest pain or shortness of breath, CALL 911 and be transported to the hospital emergency room.  If you develope a fever above 101 F, pus (white drainage) or increased drainage or redness at the wound, or calf pain, call your surgeon's office.   Constipation Prevention      Comments:   Drink plenty of fluids.  Prune juice may be helpful.  You may use a stool softener, such as Colace (over the counter) 100 mg twice a day.  Use MiraLax (over the counter) for constipation as needed.   Increase activity slowly as tolerated      Discharge instructions      Comments:   Keep knee incision dry for 5 days post op then may wet while bathing. Therapy daily  goal full extension and greater than 90 degrees flexion. Call if fever or chills or increased drainage. Go to ER if acutely short of breath or call for ambulance. Return for follow up in 2 weeks. May full weight bear on the surgical leg unless told otherwise. Use knee immobilizer until able to straight leg raise off bed with knee stable. In house walking for first 2 weeks.   Driving restrictions      Comments:   No driving for 6 weeks   Lifting restrictions      Comments:   No lifting for 6 weeks   Change dressing      Comments:   Change dressing on 01/29/2012, then change the dressing daily with sterile 4 x 4 inch gauze dressing and apply TED hose.  You may clean the incision with alcohol prior to redressing.   Do not put a pillow under the knee. Place it under the heel.      TED hose      Comments:   Use stockings (TED hose) for 2 weeks on right leg(s).  You may remove them at night for sleeping.      DISCHARGE MEDICATIONS:       Medication List  As of 01/30/2012  2:01 PM    TAKE these medications         ALPRAZolam 1 MG tablet   Commonly known as: XANAX   Take 1 mg by mouth 3 (three) times daily as needed. For anxiety      HYDROcodone-acetaminophen 5-325 MG per tablet   Commonly known as: NORCO/VICODIN   Take 1 tablet by mouth every 6 (six) hours as needed. pain      lisinopril-hydrochlorothiazide 20-12.5 MG per tablet   Commonly known as: PRINZIDE,ZESTORETIC   Take 1 tablet by mouth daily.      methocarbamol 500 MG tablet   Commonly known as: ROBAXIN   Take 1 tablet (500 mg total) by mouth every 6 (six) hours as needed.      oxyCODONE-acetaminophen 5-325 MG per tablet   Commonly known as: PERCOCET/ROXICET   Take 1 tablet by mouth every 6 (six) hours as needed. For pain      oxyCODONE-acetaminophen 5-325 MG per tablet   Commonly known as: PERCOCET/ROXICET   Take 1-2 tablets by mouth every 6 (six) hours as needed for pain (max 8 per day).      rivaroxaban 10 MG Tabs tablet   Commonly known as: XARELTO   Take 1 tablet (10 mg total) by mouth daily.      SYSTANE OP   Place 1 drop into both eyes 2 (two) times daily as needed. For dry eyes         FOLLOW UP VISIT:   Follow-up Information    Follow up with Garald Balding, MD. On 02/20/2012.   Contact information:   [place] [address] [phone]          DISPOSITION:  Home   CONDITION:  Stable    PETRARCA,BRIAN 01/30/2012, 2:01 PM

## 2012-01-26 NOTE — Progress Notes (Signed)
Subjective: 2 Days Post-Op Procedure(s) (LRB): TOTAL KNEE ARTHROPLASTY (Right) I'm ready to go. Tolerating po narcotic analgesics. Patient reports pain as mild. Up with PT ambulating in hall, only 2 steps into house otherwise no steps at home.   Objective:   VITALS:  Temp:  [98.3 F (36.8 C)-99.2 F (37.3 C)] 99.2 F (37.3 C) (11/28 0600) Pulse Rate:  [71-74] 74  (11/28 0600) Resp:  [16-18] 16  (11/28 0600) BP: (123-136)/(66-73) 136/73 mmHg (11/28 0600) SpO2:  [97 %-100 %] 100 % (11/28 0600) Weight:  [123.832 kg (273 lb)] 123.832 kg (273 lb) (11/27 1300)  Neurologically intact ABD soft Neurovascular intact Sensation intact distally Intact pulses distally Dorsiflexion/Plantar flexion intact Incision: no drainage No cellulitis present Compartment soft Drain discontinued,mid swelling no drainage, new dry dressing applied.   LABS  Basename 01/26/12 0532 01/25/12 0630  HGB 9.2* 10.6*  WBC 8.4 8.6  PLT 223 233    Basename 01/26/12 0532 01/25/12 0630  NA 131* 124*  K 4.1 4.2  CL 95* 90*  CO2 32 29  BUN 8 13  CREATININE 1.01 1.06  GLUCOSE 106* 107*   No results found for this basename: LABPT:2,INR:2 in the last 72 hours   Assessment/Plan: 2 Days Post-Op Procedure(s) (LRB): TOTAL KNEE ARTHROPLASTY (Right) Acute blood loss anemia mild.  Advance diet Up with therapy Discharge home with home health.  Eleasha Cataldo E 01/26/2012, 10:19 AM

## 2012-01-30 ENCOUNTER — Encounter (HOSPITAL_COMMUNITY): Payer: Self-pay | Admitting: Orthopaedic Surgery

## 2012-09-12 DIAGNOSIS — C659 Malignant neoplasm of unspecified renal pelvis: Secondary | ICD-10-CM | POA: Insufficient documentation

## 2012-09-12 HISTORY — PX: NEPHRECTOMY: SHX65

## 2013-02-13 ENCOUNTER — Encounter (HOSPITAL_COMMUNITY): Payer: Self-pay | Admitting: Pharmacy Technician

## 2013-02-15 NOTE — H&P (Signed)
[name], MD   Biagio Borg, PA-C [address] [place], [place]  [address]                             [phone]   ORTHOPAEDIC HISTORY & PHYSICAL  CHAYSE CARMACK MRN:  [id number] DOB/SEX:  1950-08-06/male  CHIEF COMPLAINT:  Painful left knee  HISTORY: Patient is a 62 y.o. male presented with a history of pain in the left knee. In early November, he bent down to pick up something at home and when he got up, he just had excruciating pain in his knee and has had recurrent problems to the point where it really has been a compromise of his activities.  He can only be up for an hour or so before he has to get down.  He has had some trouble sleeping.  He has not responded to cortisone. MRI does show a radial tear near the posterior horn of the medial meniscus.  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  Options discussed and her has chosen an arthroscopic debridement.  PAST MEDICAL HISTORY: Patient Active Problem List   Diagnosis Date Noted  . Anemia associated with acute blood loss 01/26/2012    Class: Acute  . Osteoarthritis of knee 01/26/2012  . Postoperative anemia due to acute blood loss 01/26/2012  . Hyponatremia 01/26/2012   Past Medical History  Diagnosis Date  . Hypertension   . Anxiety   . Mental disorder   . Arthritis   . Dermatitis     scaly bump occurs every few months, pt uses cortizone cream and it goes away  . Cancer     right kidney  . Lumbar back pain    Past Surgical History  Procedure Laterality Date  . Cervical fusion      x 2  . Knee arthroscopy      bilateral  . Rotator cuff repair      bilateral  . Carpal tunnel release      left hand x 2; right hand x 1; right elbow  . Total knee arthroplasty  01/24/2012    Procedure: TOTAL KNEE ARTHROPLASTY;  Surgeon: Garald Balding, MD;   Location: [facility];  Service: Orthopedics;  Laterality: Right;  RIGHT TOTAL KNEE REPLACEMENT  . Nephrectomy Right September 12, 2012  . Eye surgery Bilateral     Cataract removal     MEDICATIONS:   No prescriptions prior to admission    ALLERGIES:  No Known Allergies  REVIEW OF SYSTEMS:  A comprehensive review of systems was negative except for: Cardiovascular: positive for HTN Behavioral/Psych: positive for nervous tension   FAMILY HISTORY:  No family history on file.  SOCIAL HISTORY:   History  Substance Use Topics  . Smoking status: Never Smoker   . Smokeless tobacco: Not on file  . Alcohol Use: 0.0 oz/week    6-8 Cans of beer per week     Comment: daily      EXAMINATION: Vital signs in last 24 hours:    Head is normocephalic.   Eyes:  Pupils equal, round and reactive to light and accommodation.  Extraocular intact. ENT: Ears, nose, and throat were benign.   Neck: supple, no bruits were noted.   Chest: good expansion.   Lungs: essentially clear.  Cardiac: regular rhythm and rate, normal S1, S2.  No murmurs appreciated. Pulses :  1+ bilateral and symmetric in lower extremities. Abdomen is scaphoid, soft, nontender, no masses palpable, normal bowel sounds present. CNS:  He is oriented x3 and cranial nerves II-XII grossly intact. Breast, rectal, and genital exams: not performed and not indicated for an orthopedic evaluation. Musculoskeletal: On his left knee he does have a positive effusion.  He has really no medial or lateral joint pain.  He has had some medial parapatellar discomfort.  His knee was a little bit tight.  He does not have full extension, which either could be the effusion or his arthritis.  He flexes to about 95 degrees with no instability.    Imaging Review MRI does show a radial tear near the posterior horn of the medial meniscus.  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 ASSESSMENT: radial tear near the posterior horn of the medial meniscus  Past Medical History  Diagnosis Date  . Hypertension   . Anxiety   . Mental disorder   . Arthritis   . Dermatitis     scaly bump occurs every few months, pt uses cortizone cream and it goes away  . Cancer     right kidney  . Lumbar back pain     PLAN: Plan for left  arthroscopic debridement of the knee  The procedure,  risks, and benefits of total knee arthroplasty were presented and reviewed. The risks including but not limited to infection, blood clots, vascular and nerve injury, stiffness,  among others were discussed. The patient acknowledged the explanation, agreed to proceed.   Tracey Stewart 02/26/2013, 7:34 AM

## 2013-02-18 NOTE — Pre-Procedure Instructions (Signed)
B[facility] DEEMS  02/18/2013   Your procedure is scheduled on:  Tuesday February 26, 2013 @ 11:35 AM.  Report to [facility] Short Stay Entrance "A" Admitting at 9:35 AM.  Call this number if you have problems the morning of surgery: [phone]   Remember:   Do not eat food or drink liquids after midnight.   Take these medicines the morning of surgery with A SIP OF WATER: Alprazolam (Xanax) if needed for anxiety, Metoprolol (Lopressor), Oxycodone (Percocet) if needed for pain   Do not wear jewelry.  Do not wear lotions, powders, or colognes. You may wear deodorant.  Men may shave face and neck.  Do not bring valuables to the hospital.  [facility] is not responsible for any belongings or valuables.               Contacts, dentures or bridgework may not be worn into surgery.  Leave suitcase in the car. After surgery it may be brought to your room.  For patients admitted to the hospital, discharge time is determined by your treatment team.               Patients discharged the day of surgery will not be allowed to drive home.  Name and phone number of your driver: Family/Friend  Special Instructions: Shower using CHG 2 nights before surgery and the night before surgery.  If you shower the day of surgery use CHG.  Use special wash - you have one bottle of CHG for all showers.  You should use approximately 1/3 of the bottle for each shower.   Please read over the following fact sheets that you were given: Pain Booklet, Coughing and Deep Breathing and Surgical Site Infection Prevention

## 2013-02-19 ENCOUNTER — Encounter (HOSPITAL_COMMUNITY)
Admission: RE | Admit: 2013-02-19 | Discharge: 2013-02-19 | Disposition: A | Discharge status: Home / Self Care | Payer: Managed Care, Other (non HMO) | Source: Ambulatory Visit | Attending: Orthopaedic Surgery | Admitting: Orthopaedic Surgery

## 2013-02-19 ENCOUNTER — Encounter (HOSPITAL_COMMUNITY): Payer: Self-pay

## 2013-02-19 ENCOUNTER — Ambulatory Visit (HOSPITAL_COMMUNITY)
Admission: RE | Admit: 2013-02-19 | Discharge: 2013-02-19 | Disposition: A | Discharge status: Home / Self Care | Payer: Managed Care, Other (non HMO) | Source: Ambulatory Visit | Attending: Orthopaedic Surgery | Admitting: Orthopaedic Surgery

## 2013-02-19 DIAGNOSIS — Z01810 Encounter for preprocedural cardiovascular examination: Secondary | ICD-10-CM | POA: Insufficient documentation

## 2013-02-19 DIAGNOSIS — Z01812 Encounter for preprocedural laboratory examination: Secondary | ICD-10-CM | POA: Insufficient documentation

## 2013-02-19 HISTORY — DX: Malignant (primary) neoplasm, unspecified: C80.1

## 2013-02-19 HISTORY — DX: Low back pain, unspecified: M54.50

## 2013-02-19 HISTORY — DX: Low back pain: M54.5

## 2013-02-19 LAB — URINALYSIS, ROUTINE W REFLEX MICROSCOPIC
Bilirubin Urine: NEGATIVE
Glucose, UA: NEGATIVE mg/dL
Hgb urine dipstick: NEGATIVE
Protein, ur: NEGATIVE mg/dL
pH: 6.5 (ref 5.0–8.0)

## 2013-02-19 LAB — CBC WITH DIFFERENTIAL/PLATELET
HCT: 40.9 % (ref 39.0–52.0)
Hemoglobin: 14.6 g/dL (ref 13.0–17.0)
Lymphocytes Relative: 17 % (ref 12–46)
Lymphs Abs: 1.7 10*3/uL (ref 0.7–4.0)
MCH: 35.5 pg — ABNORMAL HIGH (ref 26.0–34.0)
Monocytes Relative: 11 % (ref 3–12)
Neutro Abs: 6.7 10*3/uL (ref 1.7–7.7)
Neutrophils Relative %: 66 % (ref 43–77)
Platelets: 286 10*3/uL (ref 150–400)
RBC: 4.11 MIL/uL — ABNORMAL LOW (ref 4.22–5.81)
WBC: 10.1 10*3/uL (ref 4.0–10.5)

## 2013-02-19 LAB — COMPREHENSIVE METABOLIC PANEL
ALT: 23 U/L (ref 0–53)
AST: 24 U/L (ref 0–37)
Alkaline Phosphatase: 76 U/L (ref 39–117)
BUN: 16 mg/dL (ref 6–23)
CO2: 27 mEq/L (ref 19–32)
Chloride: 94 mEq/L — ABNORMAL LOW (ref 96–112)
GFR calc Af Amer: 62 mL/min — ABNORMAL LOW (ref >90)
GFR calc non Af Amer: 54 mL/min — ABNORMAL LOW (ref >90)
Glucose, Bld: 97 mg/dL (ref 70–99)
Potassium: 5 mEq/L (ref 3.5–5.1)
Sodium: 130 mEq/L — ABNORMAL LOW (ref 135–145)
Total Bilirubin: 0.4 mg/dL (ref 0.3–1.2)
Total Protein: 7 g/dL (ref 6.0–8.3)

## 2013-02-19 LAB — PROTIME-INR: Prothrombin Time: 12.8 seconds (ref 11.6–15.2)

## 2013-02-19 NOTE — Progress Notes (Signed)
Patient denied having any cardiac or pulmonary issues. PCP is Dr. Lilian Kapur in [place], [place]. Patient informed Nurse that he had his right kidney removed in July of 2014 at [facility], but denied having any issues thereafter. Urologist is Dr. Clent Jacks also in [place]. Office # [phone].

## 2013-02-20 ENCOUNTER — Encounter (HOSPITAL_COMMUNITY): Payer: Self-pay

## 2013-02-20 NOTE — Progress Notes (Signed)
Anesthesia chart review: Patient is a 62 year old male scheduled for arthroscopic debridement, left knee on 02/26/13 by Dr. Durward Fortes.    History includes obesity, nonsmoker, hypertension, anxiety, arthritis, prior cervical fusion, right TKA '13, right nephrectomy 08/2012 for renal cancer. PCP is Dr. Lilian Kapur in [place], [place]. Urologist is Dr. Clent Jacks at [facility].  EKG on 02/19/13 showed NSR, septal infarct (age undetermined).  It was not felt significantly changed since his previous tracing on 01/18/12.  CXR on 02/19/13 showed no active cardiopulmonary disease.  Preoperative labs noted.    If no acute changes then I would anticipate that he could proceed as planned.  George Hugh [facility] Short Stay Center/Anesthesiology Phone [phone] 02/20/2013 10:13 AM

## 2013-02-26 ENCOUNTER — Encounter (HOSPITAL_COMMUNITY)
Admission: RE | Disposition: A | Discharge status: Home / Self Care | Payer: Self-pay | Source: Ambulatory Visit | Attending: Orthopaedic Surgery

## 2013-02-26 ENCOUNTER — Encounter (HOSPITAL_COMMUNITY): Payer: Managed Care, Other (non HMO) | Admitting: Vascular Surgery

## 2013-02-26 ENCOUNTER — Ambulatory Visit (HOSPITAL_COMMUNITY): Payer: Managed Care, Other (non HMO) | Admitting: Anesthesiology

## 2013-02-26 ENCOUNTER — Ambulatory Visit (HOSPITAL_COMMUNITY)
Admission: RE | Admit: 2013-02-26 | Discharge: 2013-02-26 | Disposition: A | Discharge status: Home / Self Care | Payer: Managed Care, Other (non HMO) | Source: Ambulatory Visit | Attending: Orthopaedic Surgery | Admitting: Orthopaedic Surgery

## 2013-02-26 ENCOUNTER — Encounter (HOSPITAL_COMMUNITY): Payer: Self-pay | Admitting: *Deleted

## 2013-02-26 DIAGNOSIS — M545 Low back pain, unspecified: Secondary | ICD-10-CM | POA: Insufficient documentation

## 2013-02-26 DIAGNOSIS — M2340 Loose body in knee, unspecified knee: Secondary | ICD-10-CM | POA: Insufficient documentation

## 2013-02-26 DIAGNOSIS — Z85528 Personal history of other malignant neoplasm of kidney: Secondary | ICD-10-CM | POA: Insufficient documentation

## 2013-02-26 DIAGNOSIS — M1710 Unilateral primary osteoarthritis, unspecified knee: Secondary | ICD-10-CM | POA: Insufficient documentation

## 2013-02-26 DIAGNOSIS — X500XXA Overexertion from strenuous movement or load, initial encounter: Secondary | ICD-10-CM | POA: Insufficient documentation

## 2013-02-26 DIAGNOSIS — F411 Generalized anxiety disorder: Secondary | ICD-10-CM | POA: Insufficient documentation

## 2013-02-26 DIAGNOSIS — IMO0002 Reserved for concepts with insufficient information to code with codable children: Secondary | ICD-10-CM | POA: Insufficient documentation

## 2013-02-26 DIAGNOSIS — I10 Essential (primary) hypertension: Secondary | ICD-10-CM | POA: Insufficient documentation

## 2013-02-26 DIAGNOSIS — S83242D Other tear of medial meniscus, current injury, left knee, subsequent encounter: Secondary | ICD-10-CM

## 2013-02-26 DIAGNOSIS — S83249A Other tear of medial meniscus, current injury, unspecified knee, initial encounter: Secondary | ICD-10-CM

## 2013-02-26 DIAGNOSIS — Z905 Acquired absence of kidney: Secondary | ICD-10-CM | POA: Insufficient documentation

## 2013-02-26 HISTORY — PX: KNEE ARTHROSCOPY WITH MEDIAL MENISECTOMY: SHX5651

## 2013-02-26 SURGERY — ARTHROSCOPY, KNEE, WITH MEDIAL MENISCECTOMY
Anesthesia: General | Site: Knee | Laterality: Left

## 2013-02-26 MED ORDER — HYDROMORPHONE HCL PF 1 MG/ML IJ SOLN
INTRAMUSCULAR | Status: AC
  Filled 2013-02-26: qty 1

## 2013-02-26 MED ORDER — OXYCODONE HCL 5 MG PO TABS
ORAL_TABLET | ORAL | Status: AC
  Filled 2013-02-26: qty 1

## 2013-02-26 MED ORDER — FENTANYL CITRATE 0.05 MG/ML IJ SOLN
INTRAMUSCULAR | Status: DC | PRN
  Administered 2013-02-26: 50 ug via INTRAVENOUS
  Administered 2013-02-26: 25 ug via INTRAVENOUS

## 2013-02-26 MED ORDER — OXYCODONE HCL 5 MG/5ML PO SOLN: 5.0000 mg | Freq: Once | ORAL | Status: AC | PRN

## 2013-02-26 MED ORDER — OXYCODONE HCL 5 MG PO TABS
5.0000 mg | ORAL_TABLET | Freq: Once | ORAL | Status: AC | PRN
  Administered 2013-02-26: 5 mg via ORAL

## 2013-02-26 MED ORDER — BUPIVACAINE-EPINEPHRINE 0.25% -1:200000 IJ SOLN
INTRAMUSCULAR | Status: DC | PRN
  Administered 2013-02-26: 30 mL

## 2013-02-26 MED ORDER — SODIUM CHLORIDE 0.9 % IV SOLN: INTRAVENOUS | Status: DC

## 2013-02-26 MED ORDER — OXYCODONE-ACETAMINOPHEN 5-325 MG PO TABS: 1.0000 | ORAL_TABLET | ORAL | Status: DC | PRN

## 2013-02-26 MED ORDER — BUPIVACAINE-EPINEPHRINE (PF) 0.25% -1:200000 IJ SOLN
INTRAMUSCULAR | Status: AC
  Filled 2013-02-26: qty 30

## 2013-02-26 MED ORDER — BUPIVACAINE HCL (PF) 0.25 % IJ SOLN
INTRAMUSCULAR | Status: AC
  Filled 2013-02-26: qty 30

## 2013-02-26 MED ORDER — EPHEDRINE SULFATE 50 MG/ML IJ SOLN
INTRAMUSCULAR | Status: DC | PRN
  Administered 2013-02-26: 10 mg via INTRAVENOUS
  Administered 2013-02-26: 5 mg via INTRAVENOUS
  Administered 2013-02-26: 15 mg via INTRAVENOUS

## 2013-02-26 MED ORDER — MIDAZOLAM HCL 5 MG/5ML IJ SOLN
INTRAMUSCULAR | Status: DC | PRN
  Administered 2013-02-26: 2 mg via INTRAVENOUS

## 2013-02-26 MED ORDER — LACTATED RINGERS IV SOLN
INTRAVENOUS | Status: DC
  Administered 2013-02-26: 10:00:00 via INTRAVENOUS

## 2013-02-26 MED ORDER — CHLORHEXIDINE GLUCONATE 4 % EX LIQD: 60.0000 mL | Freq: Once | CUTANEOUS | Status: DC

## 2013-02-26 MED ORDER — HYDROMORPHONE 0.3 MG/ML IV SOLN
INTRAVENOUS | Status: AC
  Filled 2013-02-26: qty 25

## 2013-02-26 MED ORDER — ONDANSETRON HCL 4 MG/2ML IJ SOLN: 4.0000 mg | Freq: Four times a day (QID) | INTRAMUSCULAR | Status: DC | PRN

## 2013-02-26 MED ORDER — LIDOCAINE HCL (CARDIAC) 20 MG/ML IV SOLN
INTRAVENOUS | Status: DC | PRN
  Administered 2013-02-26: 80 mg via INTRAVENOUS

## 2013-02-26 MED ORDER — PHENYLEPHRINE HCL 10 MG/ML IJ SOLN
INTRAMUSCULAR | Status: DC | PRN
  Administered 2013-02-26: 40 ug via INTRAVENOUS
  Administered 2013-02-26: 160 ug via INTRAVENOUS
  Administered 2013-02-26: 80 ug via INTRAVENOUS

## 2013-02-26 MED ORDER — SODIUM CHLORIDE 0.9 % IR SOLN
Status: DC | PRN
  Administered 2013-02-26: 6000 mL
  Administered 2013-02-26: 3000 mL

## 2013-02-26 MED ORDER — HYDROMORPHONE HCL PF 1 MG/ML IJ SOLN
0.2500 mg | INTRAMUSCULAR | Status: DC | PRN
  Administered 2013-02-26: 0.5 mg via INTRAVENOUS

## 2013-02-26 MED ORDER — LACTATED RINGERS IV SOLN
INTRAVENOUS | Status: DC | PRN
  Administered 2013-02-26 (×2): via INTRAVENOUS

## 2013-02-26 MED ORDER — PROPOFOL 10 MG/ML IV BOLUS
INTRAVENOUS | Status: DC | PRN
  Administered 2013-02-26: 260 mg via INTRAVENOUS

## 2013-02-26 SURGICAL SUPPLY — 37 items
BANDAGE ELASTIC 4 VELCRO ST LF (GAUZE/BANDAGES/DRESSINGS) ×2 IMPLANT
BANDAGE ELASTIC 6 VELCRO ST LF (GAUZE/BANDAGES/DRESSINGS) IMPLANT
BANDAGE GAUZE ELAST BULKY 4 IN (GAUZE/BANDAGES/DRESSINGS) ×2 IMPLANT
BLADE CUDA 5.5 (BLADE) IMPLANT
BLADE GREAT WHITE 4.2 (BLADE) ×2 IMPLANT
BUR OVAL 6.0 (BURR) IMPLANT
CLOTH BEACON ORANGE TIMEOUT ST (SAFETY) IMPLANT
CUFF TOURNIQUET SINGLE 34IN LL (TOURNIQUET CUFF) IMPLANT
CUFF TOURNIQUET SINGLE 44IN (TOURNIQUET CUFF) IMPLANT
DRAPE ARTHROSCOPY W/POUCH 114 (DRAPES) ×2 IMPLANT
DRSG EMULSION OIL 3X3 NADH (GAUZE/BANDAGES/DRESSINGS) ×2 IMPLANT
DURAPREP 26ML APPLICATOR (WOUND CARE) ×2 IMPLANT
GLOVE BIOGEL PI IND STRL 8 (GLOVE) ×3 IMPLANT
GLOVE BIOGEL PI IND STRL 8.5 (GLOVE) ×1 IMPLANT
GLOVE BIOGEL PI INDICATOR 8 (GLOVE) ×3
GLOVE BIOGEL PI INDICATOR 8.5 (GLOVE) ×1
GLOVE ECLIPSE 8.0 STRL XLNG CF (GLOVE) ×2 IMPLANT
GLOVE SURG ORTHO 8.5 STRL (GLOVE) ×2 IMPLANT
GLOVE SURG SS PI 7.5 STRL IVOR (GLOVE) ×2 IMPLANT
GLOVE SURG SS PI 8.0 STRL IVOR (GLOVE) ×2 IMPLANT
GOWN PREVENTION PLUS XLARGE (GOWN DISPOSABLE) ×2 IMPLANT
GOWN STRL NON-REIN LRG LVL3 (GOWN DISPOSABLE) ×2 IMPLANT
GOWN STRL REIN 2XL XLG LVL4 (GOWN DISPOSABLE) ×2 IMPLANT
GOWN STRL REIN XL XLG (GOWN DISPOSABLE) ×2 IMPLANT
HOLDER KNEE FOAM BLUE (MISCELLANEOUS) ×2 IMPLANT
KIT ROOM TURNOVER OR (KITS) ×2 IMPLANT
MANIFOLD NEPTUNE II (INSTRUMENTS) ×2 IMPLANT
PACK ARTHROSCOPY DSU (CUSTOM PROCEDURE TRAY) ×2 IMPLANT
PAD ABD 8X10 STRL (GAUZE/BANDAGES/DRESSINGS) ×2 IMPLANT
PAD ARMBOARD 7.5X6 YLW CONV (MISCELLANEOUS) ×4 IMPLANT
SET ARTHROSCOPY TUBING (MISCELLANEOUS) ×1
SET ARTHROSCOPY TUBING LN (MISCELLANEOUS) ×1 IMPLANT
SPONGE GAUZE 4X4 12PLY (GAUZE/BANDAGES/DRESSINGS) ×2 IMPLANT
SPONGE LAP 4X18 X RAY DECT (DISPOSABLE) ×2 IMPLANT
TOWEL OR 17X24 6PK STRL BLUE (TOWEL DISPOSABLE) ×4 IMPLANT
WAND 90 DEG TURBOVAC W/CORD (SURGICAL WAND) IMPLANT
WATER STERILE IRR 1000ML POUR (IV SOLUTION) ×2 IMPLANT

## 2013-02-26 NOTE — H&P (Signed)
  The recent History & Physical has been reviewed. I have personally examined the patient today. There is no interval change to the documented History & Physical. The patient would like to proceed with the procedure.  [name] W 02/26/2013,  11:35 AM

## 2013-02-26 NOTE — Anesthesia Preprocedure Evaluation (Signed)
Anesthesia Evaluation  Patient identified by MRN, date of birth, ID band Patient awake    Reviewed: Allergy & Precautions, H&P , NPO status   Airway Mallampati: II  Neck ROM: full    Dental   Pulmonary          Cardiovascular hypertension,     Neuro/Psych Anxiety    GI/Hepatic   Endo/Other  Morbid obesity  Renal/GU      Musculoskeletal  (+) Arthritis -,   Abdominal   Peds  Hematology   Anesthesia Other Findings   Reproductive/Obstetrics                           Anesthesia Physical Anesthesia Plan  ASA: II  Anesthesia Plan: General   Post-op Pain Management:    Induction: Intravenous  Airway Management Planned: LMA  Additional Equipment:   Intra-op Plan:   Post-operative Plan:   Informed Consent: I have reviewed the patients History and Physical, chart, labs and discussed the procedure including the risks, benefits and alternatives for the proposed anesthesia with the patient or authorized representative who has indicated his/her understanding and acceptance.     Plan Discussed with: CRNA, Anesthesiologist and Surgeon  Anesthesia Plan Comments:         Anesthesia Quick Evaluation

## 2013-02-26 NOTE — H&P (Signed)
  PATIENT ID:      Jesus [name]  MRN:     [id number] DOB/AGE:    [birth year] / 61 y.o.       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                                                       Estimated body mass index is 38.09 kg/(m^2) as calculated from the following:   Height as of 02/19/13: 5\' 11"  (1.803 m).   Weight as of 01/18/12: 123.832 kg (273 lb).     POSTOPERATIVE DIAGNOSIS:   left knee tricompartmental osteoarthritis with medial meniscal tear and loose body (multiple loose bodies)                                                                    Estimated body mass index is 38.09 kg/(m^2) as calculated from the following:   Height as of 02/19/13: 5\' 11"  (1.803 m).   Weight as of 01/18/12: 123.832 kg (273 lb).     PROCEDURE:  Procedure(s):LEFT KNEE ARTHROSCOPY WITH MEDIAL MENISECTOMY,REMOVAL MULTIPLE LOOSE BODIES     SURGEON:  [name], MD    ASSISTANT:   Biagio Borg, PA-C   (Present and scrubbed throughout the case, critical for assistance with exposure, retraction, instrumentation, and closure.)          ANESTHESIA: local and general     DRAINS: none :      TOURNIQUET TIME: * No tourniquets in log *    COMPLICATIONS:  None   CONDITION:  stable  PROCEDURE IN DETAILSM:[id number]   [name], Jesus W 02/26/2013, 1:19 PM

## 2013-02-26 NOTE — Anesthesia Postprocedure Evaluation (Signed)
  Anesthesia Post-op Note  Patient: Jesus [name]  Procedure(s) Performed: Procedure(s): KNEE ARTHROSCOPY WITH MEDIAL MENISECTOMY (Left)  Patient Location: PACU  Anesthesia Type:General  Level of Consciousness: awake  Airway and Oxygen Therapy: Patient Spontanous Breathing  Post-op Pain: mild  Post-op Assessment: Post-op Vital signs reviewed, Patient's Cardiovascular Status Stable, Respiratory Function Stable, Patent Airway, No signs of Nausea or vomiting and Pain level controlled  Post-op Vital Signs: Reviewed and stable  Complications: No apparent anesthesia complications

## 2013-02-26 NOTE — Transfer of Care (Signed)
Immediate Anesthesia Transfer of Care Note  Patient: Jesus [name]  Procedure(s) Performed: Procedure(s): KNEE ARTHROSCOPY WITH MEDIAL MENISECTOMY (Left)  Patient Location: PACU  Anesthesia Type:General  Level of Consciousness: awake, alert  and oriented  Airway & Oxygen Therapy: Patient Spontanous Breathing and Patient connected to nasal cannula oxygen  Post-op Assessment: Report given to PACU RN and Post -op Vital signs reviewed and stable  Post vital signs: Reviewed and stable  Complications: No apparent anesthesia complications

## 2013-02-27 ENCOUNTER — Encounter (HOSPITAL_COMMUNITY): Payer: Self-pay | Admitting: Orthopaedic Surgery

## 2013-02-27 NOTE — Op Note (Signed)
Jesus [name], Jesus [name]                 ACCOUNT NO.:  [id number]  MEDICAL RECORD NO.:  [id number]  LOCATION:  [facility]                         FACILITY:  [facility]  PHYSICIAN:  Vonna Kotyk. Son Barkan, M.D.DATE OF BIRTH:  [birth year]  DATE OF PROCEDURE:  02/26/2013 DATE OF DISCHARGE:  02/26/2013                              OPERATIVE REPORT   PREOPERATIVE DIAGNOSES: 1. Tear medial meniscus, right knee with tricompartmental degenerative     changes. 2. Osteocartilaginous loose body.  POSTOPERATIVE DIAGNOSES: 1. Tear medial meniscus, right knee with tricompartmental degenerative     changes. 2. Osteocartilaginous loose body with multiple cartilaginous loose     bodies.  PROCEDURES: 1. Diagnostic arthroscopy, left knee. 2. Removal of multiple osteocartilaginous loose bodies. 3. Partial medial meniscectomy. 4. Chondroplasty of patella and medial compartments.  SURGEON:  Vonna Kotyk. Durward Fortes, MD  ASSISTANT:  Mike Craze. Petrarca, P.A.-C  ANESTHESIA:  General with local 0.25% Marcaine with epinephrine.  COMPLICATIONS:  None.  HISTORY:  A 62 year old gentlemen bent down to pick up something at home in early November when he experienced excruciating pain in his left knee to the point of compromise.  Despite cortisone and anti-inflammatory medicines, he has had trouble sleeping and even walking.  An MRI scan was performed, revealing tricompartment degenerative changes, osteocartilaginous loose body in the medial compartment and radial tear of the medial meniscus.  He is now to have an arthroscopic evaluation.  PROCEDURE IN DETAIL:  Mr. Parrow was met in the holding area, identified the left knee as the appropriate operative site and marked it accordingly.  The patient was then transported to room #7, placed under general anesthesia without difficulty.  The left leg was then placed in a thigh holder and the leg was then prepped with DuraPrep and a thigh holder to the ankle.  Sterile draping was  performed.  A 0.25% Marcaine with epinephrine was injected in either side of the patellar tendon.  Two arthroscopic portals were established with an 11 blade knife.  The arthroscope was initially placed in the lateral compartment.  There was a clear yellow joint effusion and multiple small pieces of cartilage.  Diagnostic arthroscopy revealed diffuse synovitis.  In the superior pouch, there were large osteophytes of the patellofemoral joint that actually almost occluded the superior pouch.  I removed __________ just for visualization and noted that there were multiple small and large ostial cartilaginous loose bodies, measuring anywhere from 0.2 to 0.5 cm.  These were removed with a Cuda shaver and the pituitary rongeurs.  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  There were multiple loose bodies within each gutter and these were removed as well.  In the medial compartment, there were grade 4 areas of chondromalacia of the femoral condyle and tibial plateau.  A large rounded osteocartilaginous loose body that was probably 6 mm in diameter behind the medial meniscus which was torn.  I performed a partial medial meniscectomy and removed the large loose body.  I probed the remaining meniscus and was intact.  There were multiple loose bodies attached to the synovium within the intercondylar notch.  These were debrided.  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  The PCL was intact.  Synovectomy was performed.  In the lateral compartment, there were some grade 3 changes of chondromalacia.  The lateral meniscus was intact.  The joint was then copiously irrigated with saline solution.  We did close the medial portal with 4-0 Ethilon.  We injected the knee with 0.25% Marcaine with epinephrine.  Sterile bulky dressing was applied followed by an Ace bandage.  PLAN:  Percocet for pain.  Office in 1  week.     Vonna Kotyk. Durward Fortes, M.D.     PWW/MEDQ  D:  02/26/2013  T:  02/27/2013  Job:  [id number]

## 2015-03-09 DIAGNOSIS — C649 Malignant neoplasm of unspecified kidney, except renal pelvis: Secondary | ICD-10-CM | POA: Diagnosis not present

## 2015-03-09 DIAGNOSIS — C679 Malignant neoplasm of bladder, unspecified: Secondary | ICD-10-CM | POA: Diagnosis not present

## 2015-03-09 DIAGNOSIS — C689 Malignant neoplasm of urinary organ, unspecified: Secondary | ICD-10-CM | POA: Diagnosis not present

## 2015-03-09 DIAGNOSIS — C662 Malignant neoplasm of left ureter: Secondary | ICD-10-CM | POA: Diagnosis not present

## 2015-04-03 DIAGNOSIS — C662 Malignant neoplasm of left ureter: Secondary | ICD-10-CM | POA: Diagnosis not present

## 2015-04-05 DIAGNOSIS — C669 Malignant neoplasm of unspecified ureter: Secondary | ICD-10-CM | POA: Diagnosis not present

## 2015-04-05 DIAGNOSIS — N179 Acute kidney failure, unspecified: Secondary | ICD-10-CM | POA: Diagnosis not present

## 2015-04-05 DIAGNOSIS — D696 Thrombocytopenia, unspecified: Secondary | ICD-10-CM | POA: Diagnosis not present

## 2015-04-05 DIAGNOSIS — E875 Hyperkalemia: Secondary | ICD-10-CM | POA: Diagnosis not present

## 2015-04-05 DIAGNOSIS — I10 Essential (primary) hypertension: Secondary | ICD-10-CM | POA: Diagnosis not present

## 2015-04-05 DIAGNOSIS — Z6841 Body Mass Index (BMI) 40.0 and over, adult: Secondary | ICD-10-CM | POA: Diagnosis not present

## 2015-04-05 DIAGNOSIS — K219 Gastro-esophageal reflux disease without esophagitis: Secondary | ICD-10-CM | POA: Diagnosis not present

## 2015-04-05 DIAGNOSIS — Z9221 Personal history of antineoplastic chemotherapy: Secondary | ICD-10-CM | POA: Diagnosis not present

## 2015-04-05 DIAGNOSIS — E669 Obesity, unspecified: Secondary | ICD-10-CM | POA: Diagnosis present

## 2015-04-05 DIAGNOSIS — C679 Malignant neoplasm of bladder, unspecified: Secondary | ICD-10-CM | POA: Diagnosis not present

## 2015-04-05 DIAGNOSIS — D72829 Elevated white blood cell count, unspecified: Secondary | ICD-10-CM | POA: Diagnosis not present

## 2015-04-05 DIAGNOSIS — K668 Other specified disorders of peritoneum: Secondary | ICD-10-CM | POA: Diagnosis present

## 2015-04-05 DIAGNOSIS — R319 Hematuria, unspecified: Secondary | ICD-10-CM | POA: Diagnosis present

## 2015-04-05 DIAGNOSIS — C642 Malignant neoplasm of left kidney, except renal pelvis: Secondary | ICD-10-CM | POA: Diagnosis not present

## 2015-04-05 DIAGNOSIS — I110 Hypertensive heart disease with heart failure: Secondary | ICD-10-CM | POA: Diagnosis not present

## 2015-04-05 DIAGNOSIS — Z992 Dependence on renal dialysis: Secondary | ICD-10-CM | POA: Diagnosis not present

## 2015-04-05 DIAGNOSIS — I4589 Other specified conduction disorders: Secondary | ICD-10-CM | POA: Diagnosis not present

## 2015-04-05 DIAGNOSIS — I120 Hypertensive chronic kidney disease with stage 5 chronic kidney disease or end stage renal disease: Secondary | ICD-10-CM | POA: Diagnosis present

## 2015-04-05 DIAGNOSIS — C662 Malignant neoplasm of left ureter: Secondary | ICD-10-CM | POA: Diagnosis present

## 2015-04-05 DIAGNOSIS — E871 Hypo-osmolality and hyponatremia: Secondary | ICD-10-CM | POA: Diagnosis present

## 2015-04-05 DIAGNOSIS — Z79899 Other long term (current) drug therapy: Secondary | ICD-10-CM | POA: Diagnosis not present

## 2015-04-05 DIAGNOSIS — C678 Malignant neoplasm of overlapping sites of bladder: Secondary | ICD-10-CM | POA: Diagnosis not present

## 2015-04-05 DIAGNOSIS — N186 End stage renal disease: Secondary | ICD-10-CM | POA: Diagnosis not present

## 2015-04-05 DIAGNOSIS — R001 Bradycardia, unspecified: Secondary | ICD-10-CM | POA: Diagnosis not present

## 2015-04-06 HISTORY — PX: NEPHRECTOMY: SHX65

## 2015-04-06 HISTORY — PX: PROSTATECTOMY: SHX69

## 2015-04-06 HISTORY — PX: BLADDER SURGERY: SHX569

## 2015-04-07 DIAGNOSIS — I10 Essential (primary) hypertension: Secondary | ICD-10-CM | POA: Insufficient documentation

## 2015-04-07 DIAGNOSIS — K219 Gastro-esophageal reflux disease without esophagitis: Secondary | ICD-10-CM

## 2015-04-07 HISTORY — DX: Gastro-esophageal reflux disease without esophagitis: K21.9

## 2015-04-11 DIAGNOSIS — N186 End stage renal disease: Secondary | ICD-10-CM | POA: Diagnosis not present

## 2015-04-11 DIAGNOSIS — Z992 Dependence on renal dialysis: Secondary | ICD-10-CM | POA: Diagnosis not present

## 2015-04-11 DIAGNOSIS — D509 Iron deficiency anemia, unspecified: Secondary | ICD-10-CM | POA: Diagnosis not present

## 2015-04-11 DIAGNOSIS — N2581 Secondary hyperparathyroidism of renal origin: Secondary | ICD-10-CM | POA: Diagnosis not present

## 2015-04-14 DIAGNOSIS — N186 End stage renal disease: Secondary | ICD-10-CM | POA: Diagnosis not present

## 2015-04-14 DIAGNOSIS — N2581 Secondary hyperparathyroidism of renal origin: Secondary | ICD-10-CM | POA: Diagnosis not present

## 2015-04-14 DIAGNOSIS — D509 Iron deficiency anemia, unspecified: Secondary | ICD-10-CM | POA: Diagnosis not present

## 2015-04-14 DIAGNOSIS — Z992 Dependence on renal dialysis: Secondary | ICD-10-CM | POA: Diagnosis not present

## 2015-04-16 DIAGNOSIS — E668 Other obesity: Secondary | ICD-10-CM | POA: Diagnosis not present

## 2015-04-16 DIAGNOSIS — R188 Other ascites: Secondary | ICD-10-CM | POA: Diagnosis not present

## 2015-04-16 DIAGNOSIS — N186 End stage renal disease: Secondary | ICD-10-CM | POA: Diagnosis not present

## 2015-04-16 DIAGNOSIS — Z48816 Encounter for surgical aftercare following surgery on the genitourinary system: Secondary | ICD-10-CM | POA: Diagnosis not present

## 2015-04-16 DIAGNOSIS — N2581 Secondary hyperparathyroidism of renal origin: Secondary | ICD-10-CM | POA: Diagnosis not present

## 2015-04-16 DIAGNOSIS — Z992 Dependence on renal dialysis: Secondary | ICD-10-CM | POA: Diagnosis not present

## 2015-04-16 DIAGNOSIS — D509 Iron deficiency anemia, unspecified: Secondary | ICD-10-CM | POA: Diagnosis not present

## 2015-04-18 DIAGNOSIS — D509 Iron deficiency anemia, unspecified: Secondary | ICD-10-CM | POA: Diagnosis not present

## 2015-04-18 DIAGNOSIS — N2581 Secondary hyperparathyroidism of renal origin: Secondary | ICD-10-CM | POA: Diagnosis not present

## 2015-04-18 DIAGNOSIS — Z992 Dependence on renal dialysis: Secondary | ICD-10-CM | POA: Diagnosis not present

## 2015-04-18 DIAGNOSIS — N186 End stage renal disease: Secondary | ICD-10-CM | POA: Diagnosis not present

## 2015-04-21 DIAGNOSIS — D509 Iron deficiency anemia, unspecified: Secondary | ICD-10-CM | POA: Diagnosis not present

## 2015-04-21 DIAGNOSIS — N186 End stage renal disease: Secondary | ICD-10-CM | POA: Diagnosis not present

## 2015-04-21 DIAGNOSIS — N2581 Secondary hyperparathyroidism of renal origin: Secondary | ICD-10-CM | POA: Diagnosis not present

## 2015-04-21 DIAGNOSIS — Z992 Dependence on renal dialysis: Secondary | ICD-10-CM | POA: Diagnosis not present

## 2015-04-23 DIAGNOSIS — N2581 Secondary hyperparathyroidism of renal origin: Secondary | ICD-10-CM | POA: Diagnosis not present

## 2015-04-23 DIAGNOSIS — N186 End stage renal disease: Secondary | ICD-10-CM | POA: Diagnosis not present

## 2015-04-23 DIAGNOSIS — D509 Iron deficiency anemia, unspecified: Secondary | ICD-10-CM | POA: Diagnosis not present

## 2015-04-23 DIAGNOSIS — Z992 Dependence on renal dialysis: Secondary | ICD-10-CM | POA: Diagnosis not present

## 2015-04-25 DIAGNOSIS — N2581 Secondary hyperparathyroidism of renal origin: Secondary | ICD-10-CM | POA: Diagnosis not present

## 2015-04-25 DIAGNOSIS — D509 Iron deficiency anemia, unspecified: Secondary | ICD-10-CM | POA: Diagnosis not present

## 2015-04-25 DIAGNOSIS — N186 End stage renal disease: Secondary | ICD-10-CM | POA: Diagnosis not present

## 2015-04-25 DIAGNOSIS — Z992 Dependence on renal dialysis: Secondary | ICD-10-CM | POA: Diagnosis not present

## 2015-04-28 DIAGNOSIS — N186 End stage renal disease: Secondary | ICD-10-CM | POA: Diagnosis not present

## 2015-04-28 DIAGNOSIS — Z992 Dependence on renal dialysis: Secondary | ICD-10-CM | POA: Diagnosis not present

## 2015-04-28 DIAGNOSIS — D509 Iron deficiency anemia, unspecified: Secondary | ICD-10-CM | POA: Diagnosis not present

## 2015-04-28 DIAGNOSIS — N2581 Secondary hyperparathyroidism of renal origin: Secondary | ICD-10-CM | POA: Diagnosis not present

## 2015-04-30 DIAGNOSIS — D509 Iron deficiency anemia, unspecified: Secondary | ICD-10-CM | POA: Diagnosis not present

## 2015-04-30 DIAGNOSIS — D631 Anemia in chronic kidney disease: Secondary | ICD-10-CM | POA: Diagnosis not present

## 2015-04-30 DIAGNOSIS — N2581 Secondary hyperparathyroidism of renal origin: Secondary | ICD-10-CM | POA: Diagnosis not present

## 2015-04-30 DIAGNOSIS — N186 End stage renal disease: Secondary | ICD-10-CM | POA: Diagnosis not present

## 2015-04-30 DIAGNOSIS — Z992 Dependence on renal dialysis: Secondary | ICD-10-CM | POA: Diagnosis not present

## 2015-05-02 DIAGNOSIS — Z992 Dependence on renal dialysis: Secondary | ICD-10-CM | POA: Diagnosis not present

## 2015-05-02 DIAGNOSIS — D631 Anemia in chronic kidney disease: Secondary | ICD-10-CM | POA: Diagnosis not present

## 2015-05-02 DIAGNOSIS — D509 Iron deficiency anemia, unspecified: Secondary | ICD-10-CM | POA: Diagnosis not present

## 2015-05-02 DIAGNOSIS — N186 End stage renal disease: Secondary | ICD-10-CM | POA: Diagnosis not present

## 2015-05-02 DIAGNOSIS — N2581 Secondary hyperparathyroidism of renal origin: Secondary | ICD-10-CM | POA: Diagnosis not present

## 2015-05-04 DIAGNOSIS — M5090 Cervical disc disorder, unspecified, unspecified cervical region: Secondary | ICD-10-CM | POA: Diagnosis not present

## 2015-05-04 DIAGNOSIS — M5020 Other cervical disc displacement, unspecified cervical region: Secondary | ICD-10-CM | POA: Diagnosis not present

## 2015-05-05 ENCOUNTER — Other Ambulatory Visit: Payer: Self-pay

## 2015-05-05 DIAGNOSIS — N186 End stage renal disease: Secondary | ICD-10-CM

## 2015-05-05 DIAGNOSIS — Z01810 Encounter for preprocedural cardiovascular examination: Secondary | ICD-10-CM

## 2015-05-05 DIAGNOSIS — D631 Anemia in chronic kidney disease: Secondary | ICD-10-CM | POA: Diagnosis not present

## 2015-05-05 DIAGNOSIS — Z992 Dependence on renal dialysis: Secondary | ICD-10-CM | POA: Diagnosis not present

## 2015-05-05 DIAGNOSIS — N2581 Secondary hyperparathyroidism of renal origin: Secondary | ICD-10-CM | POA: Diagnosis not present

## 2015-05-05 DIAGNOSIS — D509 Iron deficiency anemia, unspecified: Secondary | ICD-10-CM | POA: Diagnosis not present

## 2015-05-07 DIAGNOSIS — N2581 Secondary hyperparathyroidism of renal origin: Secondary | ICD-10-CM | POA: Diagnosis not present

## 2015-05-07 DIAGNOSIS — Z992 Dependence on renal dialysis: Secondary | ICD-10-CM | POA: Diagnosis not present

## 2015-05-07 DIAGNOSIS — N186 End stage renal disease: Secondary | ICD-10-CM | POA: Diagnosis not present

## 2015-05-07 DIAGNOSIS — D631 Anemia in chronic kidney disease: Secondary | ICD-10-CM | POA: Diagnosis not present

## 2015-05-07 DIAGNOSIS — D509 Iron deficiency anemia, unspecified: Secondary | ICD-10-CM | POA: Diagnosis not present

## 2015-05-08 DIAGNOSIS — M5020 Other cervical disc displacement, unspecified cervical region: Secondary | ICD-10-CM | POA: Diagnosis not present

## 2015-05-08 DIAGNOSIS — M9981 Other biomechanical lesions of cervical region: Secondary | ICD-10-CM | POA: Diagnosis not present

## 2015-05-08 DIAGNOSIS — M50322 Other cervical disc degeneration at C5-C6 level: Secondary | ICD-10-CM | POA: Diagnosis not present

## 2015-05-08 DIAGNOSIS — Z981 Arthrodesis status: Secondary | ICD-10-CM | POA: Diagnosis not present

## 2015-05-08 DIAGNOSIS — M5090 Cervical disc disorder, unspecified, unspecified cervical region: Secondary | ICD-10-CM | POA: Diagnosis not present

## 2015-05-09 DIAGNOSIS — Z992 Dependence on renal dialysis: Secondary | ICD-10-CM | POA: Diagnosis not present

## 2015-05-09 DIAGNOSIS — N186 End stage renal disease: Secondary | ICD-10-CM | POA: Diagnosis not present

## 2015-05-09 DIAGNOSIS — D509 Iron deficiency anemia, unspecified: Secondary | ICD-10-CM | POA: Diagnosis not present

## 2015-05-09 DIAGNOSIS — N2581 Secondary hyperparathyroidism of renal origin: Secondary | ICD-10-CM | POA: Diagnosis not present

## 2015-05-09 DIAGNOSIS — D631 Anemia in chronic kidney disease: Secondary | ICD-10-CM | POA: Diagnosis not present

## 2015-05-12 DIAGNOSIS — N186 End stage renal disease: Secondary | ICD-10-CM | POA: Diagnosis not present

## 2015-05-12 DIAGNOSIS — D509 Iron deficiency anemia, unspecified: Secondary | ICD-10-CM | POA: Diagnosis not present

## 2015-05-12 DIAGNOSIS — D631 Anemia in chronic kidney disease: Secondary | ICD-10-CM | POA: Diagnosis not present

## 2015-05-12 DIAGNOSIS — N2581 Secondary hyperparathyroidism of renal origin: Secondary | ICD-10-CM | POA: Diagnosis not present

## 2015-05-12 DIAGNOSIS — Z992 Dependence on renal dialysis: Secondary | ICD-10-CM | POA: Diagnosis not present

## 2015-05-13 ENCOUNTER — Encounter: Payer: Self-pay | Admitting: Vascular Surgery

## 2015-05-13 DIAGNOSIS — R42 Dizziness and giddiness: Secondary | ICD-10-CM | POA: Diagnosis not present

## 2015-05-13 DIAGNOSIS — H9313 Tinnitus, bilateral: Secondary | ICD-10-CM | POA: Diagnosis not present

## 2015-05-13 DIAGNOSIS — J0100 Acute maxillary sinusitis, unspecified: Secondary | ICD-10-CM | POA: Diagnosis not present

## 2015-05-14 DIAGNOSIS — Z992 Dependence on renal dialysis: Secondary | ICD-10-CM | POA: Diagnosis not present

## 2015-05-14 DIAGNOSIS — N186 End stage renal disease: Secondary | ICD-10-CM | POA: Diagnosis not present

## 2015-05-14 DIAGNOSIS — N2581 Secondary hyperparathyroidism of renal origin: Secondary | ICD-10-CM | POA: Diagnosis not present

## 2015-05-14 DIAGNOSIS — D631 Anemia in chronic kidney disease: Secondary | ICD-10-CM | POA: Diagnosis not present

## 2015-05-14 DIAGNOSIS — D509 Iron deficiency anemia, unspecified: Secondary | ICD-10-CM | POA: Diagnosis not present

## 2015-05-16 DIAGNOSIS — N2581 Secondary hyperparathyroidism of renal origin: Secondary | ICD-10-CM | POA: Diagnosis not present

## 2015-05-16 DIAGNOSIS — N186 End stage renal disease: Secondary | ICD-10-CM | POA: Diagnosis not present

## 2015-05-16 DIAGNOSIS — Z992 Dependence on renal dialysis: Secondary | ICD-10-CM | POA: Diagnosis not present

## 2015-05-16 DIAGNOSIS — D631 Anemia in chronic kidney disease: Secondary | ICD-10-CM | POA: Diagnosis not present

## 2015-05-16 DIAGNOSIS — D509 Iron deficiency anemia, unspecified: Secondary | ICD-10-CM | POA: Diagnosis not present

## 2015-05-19 DIAGNOSIS — N186 End stage renal disease: Secondary | ICD-10-CM | POA: Diagnosis not present

## 2015-05-19 DIAGNOSIS — Z992 Dependence on renal dialysis: Secondary | ICD-10-CM | POA: Diagnosis not present

## 2015-05-19 DIAGNOSIS — D509 Iron deficiency anemia, unspecified: Secondary | ICD-10-CM | POA: Diagnosis not present

## 2015-05-19 DIAGNOSIS — N2581 Secondary hyperparathyroidism of renal origin: Secondary | ICD-10-CM | POA: Diagnosis not present

## 2015-05-19 DIAGNOSIS — D631 Anemia in chronic kidney disease: Secondary | ICD-10-CM | POA: Diagnosis not present

## 2015-05-20 ENCOUNTER — Ambulatory Visit (INDEPENDENT_AMBULATORY_CARE_PROVIDER_SITE_OTHER): Payer: Medicare Other | Admitting: Vascular Surgery

## 2015-05-20 ENCOUNTER — Encounter: Payer: Self-pay | Admitting: Vascular Surgery

## 2015-05-20 ENCOUNTER — Ambulatory Visit (HOSPITAL_COMMUNITY)
Admission: RE | Admit: 2015-05-20 | Discharge: 2015-05-20 | Disposition: A | Discharge status: Home / Self Care | Payer: Medicare Other | Source: Ambulatory Visit | Attending: Vascular Surgery | Admitting: Vascular Surgery

## 2015-05-20 ENCOUNTER — Ambulatory Visit (INDEPENDENT_AMBULATORY_CARE_PROVIDER_SITE_OTHER)
Admission: RE | Admit: 2015-05-20 | Discharge: 2015-05-20 | Disposition: A | Discharge status: Home / Self Care | Payer: Medicare Other | Source: Ambulatory Visit | Attending: Vascular Surgery | Admitting: Vascular Surgery

## 2015-05-20 VITALS — BP 160/82 | HR 47 | Temp 97.3°F | Resp 18 | Ht 70.0 in | Wt 266.8 lb

## 2015-05-20 DIAGNOSIS — N186 End stage renal disease: Secondary | ICD-10-CM

## 2015-05-20 DIAGNOSIS — Z01810 Encounter for preprocedural cardiovascular examination: Secondary | ICD-10-CM | POA: Insufficient documentation

## 2015-05-20 DIAGNOSIS — I120 Hypertensive chronic kidney disease with stage 5 chronic kidney disease or end stage renal disease: Secondary | ICD-10-CM | POA: Insufficient documentation

## 2015-05-20 DIAGNOSIS — N184 Chronic kidney disease, stage 4 (severe): Secondary | ICD-10-CM | POA: Diagnosis not present

## 2015-05-20 NOTE — Progress Notes (Signed)
[facility] [place]  Patient name: Jesus [name] MRN: [id number] DOB: 1950/03/12 Sex: male  REASON FOR CONSULT: Evaluate for hemodialysis access. Referred by Dr. Lowanda Foster  HPI: Jesus [name] is a 65 y.o. male, who is first for evaluation of hemodialysis access. He currently dialyzes on Tuesdays to resistance and Saturdays via a right IJ tunneled dialysis catheter that was placed February 3. He denies any recent uremic symptoms. Specifically he denies nausea, vomiting, fatigue, anorexia, or palpitations.  Have reviewed the records that were sent with him. He has a history of transitional cell carcinoma and has undergone previous right nephrectomy. He subsequently underwent a robotic-assisted cystoprostatectomy and left nephro ureterectomy.  Past Medical History  Diagnosis Date  . Hypertension   . Anxiety   . Mental disorder   . Arthritis   . Dermatitis     scaly bump occurs every few months, pt uses cortizone cream and it goes away  . Cancer ([place])     right kidney  . Lumbar back pain     History reviewed. No pertinent family history.  SOCIAL HISTORY: Social History   Social History  . Marital Status: Widowed    Spouse Name: N/A  . Number of Children: N/A  . Years of Education: N/A   Occupational History  . Not on file.   Social History Main Topics  . Smoking status: Never Smoker   . Smokeless tobacco: Not on file  . Alcohol Use: No  . Drug Use: No  . Sexual Activity: Not on file   Other Topics Concern  . Not on file   Social History Narrative    No Known Allergies  Current Outpatient Prescriptions  Medication Sig Dispense Refill  . ALPRAZolam (XANAX) 1 MG tablet Take 1 mg by mouth 3 (three) times daily as needed for anxiety.     . metoprolol tartrate (LOPRESSOR) 25 MG tablet Take 25 mg by mouth 2 (two) times daily.    Marland Kitchen oxyCODONE-acetaminophen (ROXICET) 5-325 MG per tablet Take 1-2 tablets by mouth every 4 (four) hours as needed for  severe pain. 50 tablet 0  . Polyethyl Glycol-Propyl Glycol (SYSTANE OP) Place 1 drop into both eyes daily as needed (for dry, itchy eyes).    . Calcium Carb-Cholecalciferol (CALCIUM 600 + D PO) Take 1 tablet by mouth 2 (two) times daily. Reported on 05/20/2015    . lisinopril (PRINIVIL,ZESTRIL) 10 MG tablet Take 10 mg by mouth daily. Reported on 05/20/2015     No current facility-administered medications for this visit.    REVIEW OF SYSTEMS:  [X]  denotes positive finding, [ ]  denotes negative finding Cardiac  Comments:  Chest pain or chest pressure:    Shortness of breath upon exertion: X   Short of breath when lying flat:    Irregular heart rhythm:        Vascular    Pain in calf, thigh, or hip brought on by ambulation:    Pain in feet at night that wakes you up from your sleep:     Blood clot in your veins:    Leg swelling:         Pulmonary    Oxygen at home:    Productive cough:     Wheezing:         Neurologic    Sudden weakness in arms or legs:     Sudden numbness in arms or legs:     Sudden onset of difficulty speaking or slurred speech:  Temporary loss of vision in one eye:     Problems with dizziness:         Gastrointestinal    Blood in stool:     Vomited blood:         Genitourinary    Burning when urinating:     Blood in urine:        Psychiatric    Major depression:         Hematologic    Bleeding problems:    Problems with blood clotting too easily:        Skin    Rashes or ulcers:        Constitutional    Fever or chills:      PHYSICAL EXAM: Filed Vitals:   05/20/15 1234 05/20/15 1238  BP: 160/78 160/82  Pulse: 47   Temp: 97.3 F (36.3 C)   TempSrc: Oral   Resp: 18   Height: 5\' 10"  (1.778 m)   Weight: 266 lb 12.8 oz (121.02 kg)   SpO2: 100%     GENERAL: The patient is a well-nourished male, in no acute distress. The vital signs are documented above. CARDIAC: There is a regular rate and rhythm.  VASCULAR: I do not detect carotid  bruits. He has palpable radial and brachial pulses bilaterally. PULMONARY: There is good air exchange bilaterally without wheezing or rales. ABDOMEN: Soft and non-tender with normal pitched bowel sounds.  MUSCULOSKELETAL: There are no major deformities or cyanosis. NEUROLOGIC: No focal weakness or paresthesias are detected. SKIN: There are no ulcers or rashes noted. PSYCHIATRIC: The patient has a normal affect.  DATA:   UPPER EXTREMITY ARTERIAL DUPLEX: I have independently interpreted his upper extremity arterial duplex. There is a triphasic radial and ulnar signal bilaterally.  UPPER EXTREMITY VEIN MAP: I have independently interpreted his upper extremity vein map.  On the right side, the forearm cephalic vein does not appear adequate. The upper arm cephalic vein is marginal in size. The basilic vein is marginal on the right.  On the left side, the forearm and upper arm cephalic vein do not appear to be adequate. The basilic vein is potentially usable although it empties into the brachial system early.  MEDICAL ISSUES:  STAGE V CHRONIC KIDNEY DISEASE: Patient is right-handed. However his best chance for fistula may be a right brachiocephalic fistula. Therefore I have recommended that we attempt access in the right arm. The forearm cephalic vein does not appear adequate. The basilic vein does not appear to be adequate either although I will explore both myself at the time of surgery with duplex. If I am unable to place a fistula and we would place an AV graft. I have explained the indications for placement of an AV fistula or AV graft. I've explained that if at all possible we will place an AV fistula.  I have reviewed the risks of placement of an AV fistula including but not limited to: failure of the fistula to mature, need for subsequent interventions, and thrombosis. In addition I have reviewed the potential complications of placement of an AV graft. These risks include, but are not limited  to, graft thrombosis, graft infection, wound healing problems, bleeding, arm swelling, and steal syndrome. All the patient's questions were answered and they are agreeable to proceed with surgery. His surgery is scheduled for 06/05/2015.   Deitra Mayo [facility] [place]: [phone]

## 2015-05-20 NOTE — Progress Notes (Signed)
Filed Vitals:   05/20/15 1234 05/20/15 1238  BP: 160/78 160/82  Pulse: 47   Temp: 97.3 F (36.3 C)   TempSrc: Oral   Resp: 18   Height: 5\' 10"  (1.778 m)   Weight: 266 lb 12.8 oz (121.02 kg)   SpO2: 100%

## 2015-05-21 ENCOUNTER — Other Ambulatory Visit: Payer: Self-pay

## 2015-05-21 DIAGNOSIS — D509 Iron deficiency anemia, unspecified: Secondary | ICD-10-CM | POA: Diagnosis not present

## 2015-05-21 DIAGNOSIS — N186 End stage renal disease: Secondary | ICD-10-CM | POA: Diagnosis not present

## 2015-05-21 DIAGNOSIS — N2581 Secondary hyperparathyroidism of renal origin: Secondary | ICD-10-CM | POA: Diagnosis not present

## 2015-05-21 DIAGNOSIS — Z992 Dependence on renal dialysis: Secondary | ICD-10-CM | POA: Diagnosis not present

## 2015-05-21 DIAGNOSIS — D631 Anemia in chronic kidney disease: Secondary | ICD-10-CM | POA: Diagnosis not present

## 2015-05-22 DIAGNOSIS — M5090 Cervical disc disorder, unspecified, unspecified cervical region: Secondary | ICD-10-CM | POA: Diagnosis not present

## 2015-05-22 DIAGNOSIS — H8110 Benign paroxysmal vertigo, unspecified ear: Secondary | ICD-10-CM | POA: Diagnosis not present

## 2015-05-22 DIAGNOSIS — M5020 Other cervical disc displacement, unspecified cervical region: Secondary | ICD-10-CM | POA: Diagnosis not present

## 2015-05-23 DIAGNOSIS — Z992 Dependence on renal dialysis: Secondary | ICD-10-CM | POA: Diagnosis not present

## 2015-05-23 DIAGNOSIS — D509 Iron deficiency anemia, unspecified: Secondary | ICD-10-CM | POA: Diagnosis not present

## 2015-05-23 DIAGNOSIS — N186 End stage renal disease: Secondary | ICD-10-CM | POA: Diagnosis not present

## 2015-05-23 DIAGNOSIS — N2581 Secondary hyperparathyroidism of renal origin: Secondary | ICD-10-CM | POA: Diagnosis not present

## 2015-05-23 DIAGNOSIS — D631 Anemia in chronic kidney disease: Secondary | ICD-10-CM | POA: Diagnosis not present

## 2015-05-26 DIAGNOSIS — Z992 Dependence on renal dialysis: Secondary | ICD-10-CM | POA: Diagnosis not present

## 2015-05-26 DIAGNOSIS — D509 Iron deficiency anemia, unspecified: Secondary | ICD-10-CM | POA: Diagnosis not present

## 2015-05-26 DIAGNOSIS — N2581 Secondary hyperparathyroidism of renal origin: Secondary | ICD-10-CM | POA: Diagnosis not present

## 2015-05-26 DIAGNOSIS — D631 Anemia in chronic kidney disease: Secondary | ICD-10-CM | POA: Diagnosis not present

## 2015-05-26 DIAGNOSIS — N186 End stage renal disease: Secondary | ICD-10-CM | POA: Diagnosis not present

## 2015-05-28 DIAGNOSIS — Z992 Dependence on renal dialysis: Secondary | ICD-10-CM | POA: Diagnosis not present

## 2015-05-28 DIAGNOSIS — D509 Iron deficiency anemia, unspecified: Secondary | ICD-10-CM | POA: Diagnosis not present

## 2015-05-28 DIAGNOSIS — N186 End stage renal disease: Secondary | ICD-10-CM | POA: Diagnosis not present

## 2015-05-28 DIAGNOSIS — N2581 Secondary hyperparathyroidism of renal origin: Secondary | ICD-10-CM | POA: Diagnosis not present

## 2015-05-28 DIAGNOSIS — D631 Anemia in chronic kidney disease: Secondary | ICD-10-CM | POA: Diagnosis not present

## 2015-05-29 DIAGNOSIS — N186 End stage renal disease: Secondary | ICD-10-CM | POA: Diagnosis not present

## 2015-05-29 DIAGNOSIS — Z992 Dependence on renal dialysis: Secondary | ICD-10-CM | POA: Diagnosis not present

## 2015-05-30 DIAGNOSIS — D509 Iron deficiency anemia, unspecified: Secondary | ICD-10-CM | POA: Diagnosis not present

## 2015-05-30 DIAGNOSIS — D631 Anemia in chronic kidney disease: Secondary | ICD-10-CM | POA: Diagnosis not present

## 2015-05-30 DIAGNOSIS — N2581 Secondary hyperparathyroidism of renal origin: Secondary | ICD-10-CM | POA: Diagnosis not present

## 2015-05-30 DIAGNOSIS — Z992 Dependence on renal dialysis: Secondary | ICD-10-CM | POA: Diagnosis not present

## 2015-05-30 DIAGNOSIS — N186 End stage renal disease: Secondary | ICD-10-CM | POA: Diagnosis not present

## 2015-06-02 ENCOUNTER — Encounter (HOSPITAL_COMMUNITY): Payer: Self-pay | Admitting: *Deleted

## 2015-06-02 DIAGNOSIS — E559 Vitamin D deficiency, unspecified: Secondary | ICD-10-CM | POA: Diagnosis not present

## 2015-06-02 DIAGNOSIS — E7801 Familial hypercholesterolemia: Secondary | ICD-10-CM | POA: Diagnosis not present

## 2015-06-02 DIAGNOSIS — Z992 Dependence on renal dialysis: Secondary | ICD-10-CM | POA: Diagnosis not present

## 2015-06-02 DIAGNOSIS — Z905 Acquired absence of kidney: Secondary | ICD-10-CM | POA: Diagnosis not present

## 2015-06-02 DIAGNOSIS — I10 Essential (primary) hypertension: Secondary | ICD-10-CM | POA: Diagnosis not present

## 2015-06-02 DIAGNOSIS — N186 End stage renal disease: Secondary | ICD-10-CM | POA: Diagnosis not present

## 2015-06-02 DIAGNOSIS — D509 Iron deficiency anemia, unspecified: Secondary | ICD-10-CM | POA: Diagnosis not present

## 2015-06-02 DIAGNOSIS — N2581 Secondary hyperparathyroidism of renal origin: Secondary | ICD-10-CM | POA: Diagnosis not present

## 2015-06-02 DIAGNOSIS — Z6838 Body mass index (BMI) 38.0-38.9, adult: Secondary | ICD-10-CM | POA: Diagnosis not present

## 2015-06-02 DIAGNOSIS — C689 Malignant neoplasm of urinary organ, unspecified: Secondary | ICD-10-CM | POA: Diagnosis not present

## 2015-06-02 DIAGNOSIS — D631 Anemia in chronic kidney disease: Secondary | ICD-10-CM | POA: Diagnosis not present

## 2015-06-02 DIAGNOSIS — Z0000 Encounter for general adult medical examination without abnormal findings: Secondary | ICD-10-CM | POA: Diagnosis not present

## 2015-06-02 DIAGNOSIS — Z1211 Encounter for screening for malignant neoplasm of colon: Secondary | ICD-10-CM | POA: Diagnosis not present

## 2015-06-02 NOTE — Progress Notes (Signed)
   06/02/15 1722  OBSTRUCTIVE SLEEP APNEA  Have you ever been diagnosed with sleep apnea through a sleep study? No  Do you snore loudly (loud enough to be heard through closed doors)?  0  Do you often feel tired, fatigued, or sleepy during the daytime (such as falling asleep during driving or talking to someone)? 1  Has anyone observed you stop breathing during your sleep? 0  Do you have, or are you being treated for high blood pressure? 1  BMI more than 35 kg/m2? 1  Age > 44 (1-yes) 1  Male Gender (Yes=1) 1  Obstructive Sleep Apnea Score 5  Score 5 or greater  Results sent to PCP

## 2015-06-02 NOTE — Progress Notes (Addendum)
Mr Cusimano denies chest pain, does become short of breath with exertion.  Mr Orbin reports that his last hemoglobin was 7.7 , drawn at [facility]  [facility] does not give transfusions.  [facility] asked him to come on Wednesday for a transfusion, but patient has another appointment.  Will request records from patients nephrologist in [place].

## 2015-06-03 ENCOUNTER — Other Ambulatory Visit: Payer: Self-pay

## 2015-06-03 NOTE — Progress Notes (Addendum)
Anesthesia Chart Review:  Pt is a 65 year old male scheduled for R AV fistula creation vs graft insertion on 06/05/2015 with Dr. Scot Dock.   Pt is a same day work up.   PMH includes:  HTN, anemia, ESRD on HD, bladder cancer. Never smoker. BMI 38. S/p prostatectomy, L nephroureterectomy 04/05/15. S/p L knee arthroscopy 02/26/13. S/p R TKA 01/24/12.   Medications include: metoprolol.   Labs will be obtained DOS. Last hgb was 7.7 on 05/24/15. Pt's oncologist, Dr. Dorthula Matas (see care everywhere) recommended a transfusion as pt was symptomatic but this has not occurred. Notified Stephanie in Dr. Nicole Cella office of low hgb, need for transfusion. She discussed with Dr. Scot Dock and 2 units of blood will be ordered for DOS.   2 EKGs 04/05/15 received from [facility]. Both show sinus bradycardia. ONe shows cannot rule out anteroseptal infarct, but reading cardiologist noted this was likely from lead placement.  EKG felt stable enough to proceed with prostatectomy, L nephroureterectomy.    If labs acceptable DOS I anticipate pt can proceed as scheduled.   Willeen Cass, FNP-BC [facility]/Anesthesiology Phone: [phone] 06/03/2015 9:35 AM

## 2015-06-04 DIAGNOSIS — Z992 Dependence on renal dialysis: Secondary | ICD-10-CM | POA: Diagnosis not present

## 2015-06-04 DIAGNOSIS — D631 Anemia in chronic kidney disease: Secondary | ICD-10-CM | POA: Diagnosis not present

## 2015-06-04 DIAGNOSIS — D509 Iron deficiency anemia, unspecified: Secondary | ICD-10-CM | POA: Diagnosis not present

## 2015-06-04 DIAGNOSIS — N2581 Secondary hyperparathyroidism of renal origin: Secondary | ICD-10-CM | POA: Diagnosis not present

## 2015-06-04 DIAGNOSIS — N186 End stage renal disease: Secondary | ICD-10-CM | POA: Diagnosis not present

## 2015-06-04 MED ORDER — DEXTROSE 5 % IV SOLN
1.5000 g | INTRAVENOUS | Status: AC
  Administered 2015-06-05: 1.5 g via INTRAVENOUS
  Filled 2015-06-04: qty 1.5

## 2015-06-04 MED ORDER — SODIUM CHLORIDE 0.9 % IV SOLN
INTRAVENOUS | Status: DC
  Administered 2015-06-05 (×2): via INTRAVENOUS

## 2015-06-05 ENCOUNTER — Ambulatory Visit (HOSPITAL_COMMUNITY)
Admission: RE | Admit: 2015-06-05 | Discharge: 2015-06-05 | Disposition: A | Discharge status: Home / Self Care | Payer: Medicare Other | Source: Ambulatory Visit | Attending: Vascular Surgery | Admitting: Vascular Surgery

## 2015-06-05 ENCOUNTER — Ambulatory Visit (HOSPITAL_COMMUNITY): Payer: Medicare Other | Admitting: Emergency Medicine

## 2015-06-05 ENCOUNTER — Encounter (HOSPITAL_COMMUNITY): Payer: Self-pay | Admitting: Certified Registered Nurse Anesthetist

## 2015-06-05 ENCOUNTER — Encounter (HOSPITAL_COMMUNITY)
Admission: RE | Disposition: A | Discharge status: Home / Self Care | Payer: Self-pay | Source: Ambulatory Visit | Attending: Vascular Surgery

## 2015-06-05 ENCOUNTER — Other Ambulatory Visit: Payer: Self-pay | Admitting: *Deleted

## 2015-06-05 DIAGNOSIS — Z96651 Presence of right artificial knee joint: Secondary | ICD-10-CM | POA: Insufficient documentation

## 2015-06-05 DIAGNOSIS — M1990 Unspecified osteoarthritis, unspecified site: Secondary | ICD-10-CM | POA: Diagnosis not present

## 2015-06-05 DIAGNOSIS — N185 Chronic kidney disease, stage 5: Secondary | ICD-10-CM | POA: Diagnosis not present

## 2015-06-05 DIAGNOSIS — Z4931 Encounter for adequacy testing for hemodialysis: Secondary | ICD-10-CM

## 2015-06-05 DIAGNOSIS — D649 Anemia, unspecified: Secondary | ICD-10-CM | POA: Diagnosis not present

## 2015-06-05 DIAGNOSIS — Z85528 Personal history of other malignant neoplasm of kidney: Secondary | ICD-10-CM | POA: Diagnosis not present

## 2015-06-05 DIAGNOSIS — I120 Hypertensive chronic kidney disease with stage 5 chronic kidney disease or end stage renal disease: Secondary | ICD-10-CM | POA: Diagnosis not present

## 2015-06-05 DIAGNOSIS — N186 End stage renal disease: Secondary | ICD-10-CM | POA: Diagnosis not present

## 2015-06-05 DIAGNOSIS — Z09 Encounter for follow-up examination after completed treatment for conditions other than malignant neoplasm: Secondary | ICD-10-CM | POA: Insufficient documentation

## 2015-06-05 HISTORY — PX: AV FISTULA PLACEMENT: SHX1204

## 2015-06-05 HISTORY — DX: Reserved for inherently not codable concepts without codable children: IMO0001

## 2015-06-05 HISTORY — DX: Anemia, unspecified: D64.9

## 2015-06-05 HISTORY — DX: Personal history of other medical treatment: Z92.89

## 2015-06-05 LAB — PREPARE RBC (CROSSMATCH)

## 2015-06-05 LAB — POCT I-STAT 4, (NA,K, GLUC, HGB,HCT)
Glucose, Bld: 89 mg/dL (ref 65–99)
HCT: 22 % — ABNORMAL LOW (ref 39.0–52.0)
HEMOGLOBIN: 7.5 g/dL — AB (ref 13.0–17.0)
Potassium: 4.1 mmol/L (ref 3.5–5.1)
SODIUM: 137 mmol/L (ref 135–145)

## 2015-06-05 SURGERY — ARTERIOVENOUS (AV) FISTULA CREATION
Anesthesia: General | Site: Arm Upper | Laterality: Right

## 2015-06-05 MED ORDER — METOPROLOL TARTRATE 25 MG PO TABS
25.0000 mg | ORAL_TABLET | Freq: Once | ORAL | Status: DC
  Filled 2015-06-05: qty 1

## 2015-06-05 MED ORDER — CHLORHEXIDINE GLUCONATE CLOTH 2 % EX PADS: 6.0000 | MEDICATED_PAD | Freq: Once | CUTANEOUS | Status: DC

## 2015-06-05 MED ORDER — PROTAMINE SULFATE 10 MG/ML IV SOLN
INTRAVENOUS | Status: AC
  Filled 2015-06-05: qty 5

## 2015-06-05 MED ORDER — FENTANYL CITRATE (PF) 250 MCG/5ML IJ SOLN
INTRAMUSCULAR | Status: AC
  Filled 2015-06-05: qty 5

## 2015-06-05 MED ORDER — FENTANYL CITRATE (PF) 100 MCG/2ML IJ SOLN
25.0000 ug | INTRAMUSCULAR | Status: DC | PRN
  Administered 2015-06-05 (×4): 25 ug via INTRAVENOUS

## 2015-06-05 MED ORDER — SODIUM CHLORIDE 0.9 % IV SOLN
INTRAVENOUS | Status: DC | PRN
  Administered 2015-06-05: 12:00:00

## 2015-06-05 MED ORDER — PAPAVERINE HCL 30 MG/ML IJ SOLN
INTRAMUSCULAR | Status: AC
  Filled 2015-06-05: qty 2

## 2015-06-05 MED ORDER — LIDOCAINE HCL (CARDIAC) 20 MG/ML IV SOLN
INTRAVENOUS | Status: AC
  Filled 2015-06-05: qty 5

## 2015-06-05 MED ORDER — HEPARIN SODIUM (PORCINE) 1000 UNIT/ML IJ SOLN
INTRAMUSCULAR | Status: AC
  Filled 2015-06-05: qty 1

## 2015-06-05 MED ORDER — SODIUM CHLORIDE 0.9 % IJ SOLN
INTRAMUSCULAR | Status: DC | PRN
  Administered 2015-06-05: 12:00:00

## 2015-06-05 MED ORDER — ONDANSETRON HCL 4 MG/2ML IJ SOLN: 4.0000 mg | Freq: Four times a day (QID) | INTRAMUSCULAR | Status: DC | PRN

## 2015-06-05 MED ORDER — LIDOCAINE HCL (CARDIAC) 20 MG/ML IV SOLN
INTRAVENOUS | Status: DC | PRN
  Administered 2015-06-05: 100 mg via INTRAVENOUS

## 2015-06-05 MED ORDER — ONDANSETRON HCL 4 MG/2ML IJ SOLN
INTRAMUSCULAR | Status: DC | PRN
  Administered 2015-06-05: 4 mg via INTRAVENOUS

## 2015-06-05 MED ORDER — METOPROLOL TARTRATE 12.5 MG HALF TABLET
ORAL_TABLET | ORAL | Status: AC
  Administered 2015-06-05: 12.5 mg
  Filled 2015-06-05: qty 2

## 2015-06-05 MED ORDER — PROPOFOL 10 MG/ML IV BOLUS
INTRAVENOUS | Status: AC
  Filled 2015-06-05: qty 20

## 2015-06-05 MED ORDER — HEPARIN SODIUM (PORCINE) 1000 UNIT/ML IJ SOLN
INTRAMUSCULAR | Status: DC | PRN
  Administered 2015-06-05: 9000 [IU] via INTRAVENOUS

## 2015-06-05 MED ORDER — ONDANSETRON HCL 4 MG/2ML IJ SOLN
INTRAMUSCULAR | Status: AC
  Filled 2015-06-05: qty 2

## 2015-06-05 MED ORDER — FENTANYL CITRATE (PF) 100 MCG/2ML IJ SOLN
INTRAMUSCULAR | Status: AC
  Filled 2015-06-05: qty 2

## 2015-06-05 MED ORDER — OXYCODONE HCL 5 MG/5ML PO SOLN: 5.0000 mg | Freq: Once | ORAL | Status: DC | PRN

## 2015-06-05 MED ORDER — MIDAZOLAM HCL 2 MG/2ML IJ SOLN
INTRAMUSCULAR | Status: AC
  Filled 2015-06-05: qty 2

## 2015-06-05 MED ORDER — PROTAMINE SULFATE 10 MG/ML IV SOLN
INTRAVENOUS | Status: DC | PRN
  Administered 2015-06-05: 40 mg via INTRAVENOUS

## 2015-06-05 MED ORDER — 0.9 % SODIUM CHLORIDE (POUR BTL) OPTIME
TOPICAL | Status: DC | PRN
  Administered 2015-06-05: 1000 mL

## 2015-06-05 MED ORDER — EPHEDRINE SULFATE 50 MG/ML IJ SOLN
INTRAMUSCULAR | Status: AC
  Filled 2015-06-05: qty 1

## 2015-06-05 MED ORDER — FENTANYL CITRATE (PF) 250 MCG/5ML IJ SOLN
INTRAMUSCULAR | Status: DC | PRN
  Administered 2015-06-05 (×2): 25 ug via INTRAVENOUS
  Administered 2015-06-05: 50 ug via INTRAVENOUS

## 2015-06-05 MED ORDER — PROPOFOL 10 MG/ML IV BOLUS
INTRAVENOUS | Status: DC | PRN
Start: 2015-06-05 — End: 2015-06-05
  Administered 2015-06-05: 200 mg via INTRAVENOUS

## 2015-06-05 MED ORDER — LIDOCAINE HCL (PF) 1 % IJ SOLN
INTRAMUSCULAR | Status: AC
  Filled 2015-06-05: qty 30

## 2015-06-05 MED ORDER — OXYCODONE-ACETAMINOPHEN 5-325 MG PO TABS: 1.0000 | ORAL_TABLET | ORAL | Status: DC | PRN

## 2015-06-05 MED ORDER — OXYCODONE HCL 5 MG PO TABS: 5.0000 mg | ORAL_TABLET | Freq: Once | ORAL | Status: DC | PRN

## 2015-06-05 SURGICAL SUPPLY — 27 items
ARMBAND PINK RESTRICT EXTREMIT (MISCELLANEOUS) ×2 IMPLANT
CANISTER SUCTION 2500CC (MISCELLANEOUS) ×2 IMPLANT
CANNULA VESSEL 3MM 2 BLNT TIP (CANNULA) ×2 IMPLANT
CLIP TI MEDIUM 6 (CLIP) ×2 IMPLANT
CLIP TI WIDE RED SMALL 6 (CLIP) ×2 IMPLANT
COVER PROBE W GEL 5X96 (DRAPES) IMPLANT
DECANTER SPIKE VIAL GLASS SM (MISCELLANEOUS) ×2 IMPLANT
ELECT REM PT RETURN 9FT ADLT (ELECTROSURGICAL) ×2
ELECTRODE REM PT RTRN 9FT ADLT (ELECTROSURGICAL) ×1 IMPLANT
GLOVE BIO SURGEON STRL SZ7.5 (GLOVE) ×2 IMPLANT
GLOVE BIOGEL PI IND STRL 8 (GLOVE) ×1 IMPLANT
GLOVE BIOGEL PI INDICATOR 8 (GLOVE) ×1
GOWN STRL REUS W/ TWL LRG LVL3 (GOWN DISPOSABLE) ×3 IMPLANT
GOWN STRL REUS W/TWL LRG LVL3 (GOWN DISPOSABLE) ×3
KIT BASIN OR (CUSTOM PROCEDURE TRAY) ×2 IMPLANT
KIT ROOM TURNOVER OR (KITS) ×2 IMPLANT
LIQUID BAND (GAUZE/BANDAGES/DRESSINGS) ×2 IMPLANT
NS IRRIG 1000ML POUR BTL (IV SOLUTION) ×2 IMPLANT
PACK CV ACCESS (CUSTOM PROCEDURE TRAY) ×2 IMPLANT
PAD ARMBOARD 7.5X6 YLW CONV (MISCELLANEOUS) ×4 IMPLANT
SPONGE SURGIFOAM ABS GEL 100 (HEMOSTASIS) IMPLANT
SUT PROLENE 6 0 BV (SUTURE) ×2 IMPLANT
SUT VIC AB 3-0 SH 27 (SUTURE) ×4
SUT VIC AB 3-0 SH 27X BRD (SUTURE) ×2 IMPLANT
SUT VICRYL 4-0 PS2 18IN ABS (SUTURE) ×6 IMPLANT
UNDERPAD 30X30 INCONTINENT (UNDERPADS AND DIAPERS) ×2 IMPLANT
WATER STERILE IRR 1000ML POUR (IV SOLUTION) ×2 IMPLANT

## 2015-06-05 NOTE — H&P (View-Only) (Signed)
[facility] [place]  Patient name: Jesus [name] MRN: [id number] DOB: Jul 26, 1950 Sex: male  REASON FOR CONSULT: Evaluate for hemodialysis access. Referred by Dr. Lowanda Foster  HPI: Jesus [name] is a 65 y.o. male, who is first for evaluation of hemodialysis access. He currently dialyzes on Tuesdays to resistance and Saturdays via a right IJ tunneled dialysis catheter that was placed February 3. He denies any recent uremic symptoms. Specifically he denies nausea, vomiting, fatigue, anorexia, or palpitations.  Have reviewed the records that were sent with him. He has a history of transitional cell carcinoma and has undergone previous right nephrectomy. He subsequently underwent a robotic-assisted cystoprostatectomy and left nephro ureterectomy.  Past Medical History  Diagnosis Date  . Hypertension   . Anxiety   . Mental disorder   . Arthritis   . Dermatitis     scaly bump occurs every few months, pt uses cortizone cream and it goes away  . Cancer ([place])     right kidney  . Lumbar back pain     History reviewed. No pertinent family history.  SOCIAL HISTORY: Social History   Social History  . Marital Status: Widowed    Spouse Name: N/A  . Number of Children: N/A  . Years of Education: N/A   Occupational History  . Not on file.   Social History Main Topics  . Smoking status: Never Smoker   . Smokeless tobacco: Not on file  . Alcohol Use: No  . Drug Use: No  . Sexual Activity: Not on file   Other Topics Concern  . Not on file   Social History Narrative    No Known Allergies  Current Outpatient Prescriptions  Medication Sig Dispense Refill  . ALPRAZolam (XANAX) 1 MG tablet Take 1 mg by mouth 3 (three) times daily as needed for anxiety.     . metoprolol tartrate (LOPRESSOR) 25 MG tablet Take 25 mg by mouth 2 (two) times daily.    Marland Kitchen oxyCODONE-acetaminophen (ROXICET) 5-325 MG per tablet Take 1-2 tablets by mouth every 4 (four) hours as needed for  severe pain. 50 tablet 0  . Polyethyl Glycol-Propyl Glycol (SYSTANE OP) Place 1 drop into both eyes daily as needed (for dry, itchy eyes).    . Calcium Carb-Cholecalciferol (CALCIUM 600 + D PO) Take 1 tablet by mouth 2 (two) times daily. Reported on 05/20/2015    . lisinopril (PRINIVIL,ZESTRIL) 10 MG tablet Take 10 mg by mouth daily. Reported on 05/20/2015     No current facility-administered medications for this visit.    REVIEW OF SYSTEMS:  [X]  denotes positive finding, [ ]  denotes negative finding Cardiac  Comments:  Chest pain or chest pressure:    Shortness of breath upon exertion: X   Short of breath when lying flat:    Irregular heart rhythm:        Vascular    Pain in calf, thigh, or hip brought on by ambulation:    Pain in feet at night that wakes you up from your sleep:     Blood clot in your veins:    Leg swelling:         Pulmonary    Oxygen at home:    Productive cough:     Wheezing:         Neurologic    Sudden weakness in arms or legs:     Sudden numbness in arms or legs:     Sudden onset of difficulty speaking or slurred speech:  Temporary loss of vision in one eye:     Problems with dizziness:         Gastrointestinal    Blood in stool:     Vomited blood:         Genitourinary    Burning when urinating:     Blood in urine:        Psychiatric    Major depression:         Hematologic    Bleeding problems:    Problems with blood clotting too easily:        Skin    Rashes or ulcers:        Constitutional    Fever or chills:      PHYSICAL EXAM: Filed Vitals:   05/20/15 1234 05/20/15 1238  BP: 160/78 160/82  Pulse: 47   Temp: 97.3 F (36.3 C)   TempSrc: Oral   Resp: 18   Height: 5\' 10"  (1.778 m)   Weight: 266 lb 12.8 oz (121.02 kg)   SpO2: 100%     GENERAL: The patient is a well-nourished male, in no acute distress. The vital signs are documented above. CARDIAC: There is a regular rate and rhythm.  VASCULAR: I do not detect carotid  bruits. He has palpable radial and brachial pulses bilaterally. PULMONARY: There is good air exchange bilaterally without wheezing or rales. ABDOMEN: Soft and non-tender with normal pitched bowel sounds.  MUSCULOSKELETAL: There are no major deformities or cyanosis. NEUROLOGIC: No focal weakness or paresthesias are detected. SKIN: There are no ulcers or rashes noted. PSYCHIATRIC: The patient has a normal affect.  DATA:   UPPER EXTREMITY ARTERIAL DUPLEX: I have independently interpreted his upper extremity arterial duplex. There is a triphasic radial and ulnar signal bilaterally.  UPPER EXTREMITY VEIN MAP: I have independently interpreted his upper extremity vein map.  On the right side, the forearm cephalic vein does not appear adequate. The upper arm cephalic vein is marginal in size. The basilic vein is marginal on the right.  On the left side, the forearm and upper arm cephalic vein do not appear to be adequate. The basilic vein is potentially usable although it empties into the brachial system early.  MEDICAL ISSUES:  STAGE V CHRONIC KIDNEY DISEASE: Patient is right-handed. However his best chance for fistula may be a right brachiocephalic fistula. Therefore I have recommended that we attempt access in the right arm. The forearm cephalic vein does not appear adequate. The basilic vein does not appear to be adequate either although I will explore both myself at the time of surgery with duplex. If I am unable to place a fistula and we would place an AV graft. I have explained the indications for placement of an AV fistula or AV graft. I've explained that if at all possible we will place an AV fistula.  I have reviewed the risks of placement of an AV fistula including but not limited to: failure of the fistula to mature, need for subsequent interventions, and thrombosis. In addition I have reviewed the potential complications of placement of an AV graft. These risks include, but are not limited  to, graft thrombosis, graft infection, wound healing problems, bleeding, arm swelling, and steal syndrome. All the patient's questions were answered and they are agreeable to proceed with surgery. His surgery is scheduled for 06/05/2015.   Deitra Mayo [facility] [place]: [phone]

## 2015-06-05 NOTE — Transfer of Care (Signed)
Immediate Anesthesia Transfer of Care Note  Patient: Jesus [name]  Procedure(s) Performed: Procedure(s): ARTERIOVENOUS (AV) FISTULA CREATION RIGHT ARM (Right)  Patient Location: PACU  Anesthesia Type:General  Level of Consciousness: awake  Airway & Oxygen Therapy: Patient Spontanous Breathing and Patient connected to nasal cannula oxygen  Post-op Assessment: Report given to RN and Post -op Vital signs reviewed and stable  Post vital signs: stable  Last Vitals:  Filed Vitals:   06/05/15 1105 06/05/15 1300  BP: 182/85   Pulse: 62   Temp: 36.9 C 36.8 C  Resp: 18     Complications: No apparent anesthesia complications

## 2015-06-05 NOTE — Anesthesia Procedure Notes (Signed)
Procedure Name: LMA Insertion Date/Time: 06/05/2015 11:25 AM Performed by: Rejeana Brock L Pre-anesthesia Checklist: Patient identified, Timeout performed, Emergency Drugs available, Suction available and Patient being monitored Patient Re-evaluated:Patient Re-evaluated prior to inductionOxygen Delivery Method: Circle system utilized Preoxygenation: Pre-oxygenation with 100% oxygen Intubation Type: IV induction Ventilation: Mask ventilation without difficulty LMA: LMA inserted LMA Size: 5.0 Number of attempts: 1 Placement Confirmation: positive ETCO2 and breath sounds checked- equal and bilateral Tube secured with: Tape Dental Injury: Teeth and Oropharynx as per pre-operative assessment

## 2015-06-05 NOTE — Anesthesia Preprocedure Evaluation (Signed)
Anesthesia Evaluation  Patient identified by MRN, date of birth, ID band Patient awake    Reviewed: Allergy & Precautions, NPO status , Patient's Chart, lab work & pertinent test results  Airway Mallampati: II   Neck ROM: full    Dental   Pulmonary shortness of breath,    breath sounds clear to auscultation       Cardiovascular hypertension,  Rhythm:regular Rate:Normal     Neuro/Psych PSYCHIATRIC DISORDERS Anxiety    GI/Hepatic   Endo/Other  obese  Renal/GU ESRFRenal disease     Musculoskeletal  (+) Arthritis ,   Abdominal   Peds  Hematology   Anesthesia Other Findings   Reproductive/Obstetrics                             Anesthesia Physical Anesthesia Plan  ASA: III  Anesthesia Plan: General   Post-op Pain Management:    Induction: Intravenous  Airway Management Planned: LMA  Additional Equipment:   Intra-op Plan:   Post-operative Plan:   Informed Consent: I have reviewed the patients History and Physical, chart, labs and discussed the procedure including the risks, benefits and alternatives for the proposed anesthesia with the patient or authorized representative who has indicated his/her understanding and acceptance.     Plan Discussed with: CRNA, Anesthesiologist and Surgeon  Anesthesia Plan Comments:         Anesthesia Quick Evaluation

## 2015-06-05 NOTE — Op Note (Signed)
    NAME: [name]   MRN: [id number] DOB: [birth year]    DATE OF OPERATION: 06/05/2015  PREOP DIAGNOSIS: Stage V chronic kidney disease  POSTOP DIAGNOSIS: Same  PROCEDURE: Right brachial cephalic AV fistula  SURGEON: Judeth Cornfield. Scot Dock, MD, FACS  ASSIST: Delena Serve, RNFA  ANESTHESIA: Gen.   EBL: minimal  INDICATIONS: Jesus [name] is a 65 y.o. male 2 presents for new access. His preoperative vein map showed that really his best option for fistula was his right upper arm cephalic vein. By my interrogation the vein was fairly far lateral.  FINDINGS: 3.5 mm upper arm cephalic vein. This was fairly far lateral and I had to mobilize an extensive length in order to reach the brachial artery.  TECHNIQUE: The patient was taken to the operating room and received a general anesthetic. The right upper extremity was prepped and draped in usual sterile fashion. A longitudinal incision was made over the cephalic vein in the upper arm. The vein was fairly far lateral. Branches were divided between clips and 3-0 silk ties. An additional longitudinal incision was made below the antecubital level in order to get adequate length for mobilization over to the brachial artery. Again branches were divided between clips and 3-0 silk ties. The vein was ligated distally and irrigated up nicely with heparinized saline and it was about a 3.5 mm vein. Through a separate longitudinal incision, the brachial artery was dissected free beneath the fascia. There was no significant plaque. The patient was heparinized. A tunnel was graded between the upper arm cephalic vein incision and the brachial artery incision and the vein was brought through the tunnel being careful to prevent twisting. The brachial artery was clamped proximally and distally and longitudinal arteriotomy is made. The vein was sewn end to side to the artery using continuous 6-0 Prolene suture. At the completion was a palpable thrill in the fistula.  Hemostasis was obtained in the wounds and the heparin was partially reversed with protamine. The wounds were closed with a deeper 3-0 Vicryl and the skin closed with 4-0 Vicryl.  Liquiband was applied. The patient tolerated the procedure well and was transferred to the recovery room in stable condition. All needle and sponge counts were correct.  Deitra Mayo, MD, FACS [facility]  DATE OF DICTATION:   06/05/2015

## 2015-06-05 NOTE — Anesthesia Postprocedure Evaluation (Signed)
Anesthesia Post Note  Patient: Jesus [name]  Procedure(s) Performed: Procedure(s) (LRB): ARTERIOVENOUS (AV) FISTULA CREATION RIGHT ARM (Right)  Patient location during evaluation: PACU Anesthesia Type: General Level of consciousness: awake and alert Pain management: pain level controlled Vital Signs Assessment: post-procedure vital signs reviewed and stable Respiratory status: spontaneous breathing, nonlabored ventilation and respiratory function stable Cardiovascular status: blood pressure returned to baseline and stable Postop Assessment: no signs of nausea or vomiting Anesthetic complications: no    Last Vitals:  Filed Vitals:   06/05/15 1431 06/05/15 1442  BP:  178/91  Pulse:  97  Temp: 36.9 C   Resp:      Last Pain:  Filed Vitals:   06/05/15 1445  PainSc: 10-Worst pain ever                 Solei Wubben A

## 2015-06-05 NOTE — Interval H&P Note (Signed)
History and Physical Interval Note:  06/05/2015 10:53 AM  Jesus [name]  has presented today for surgery, with the diagnosis of Stage V Chronic Kidney Disease N18.5  The various methods of treatment have been discussed with the patient and family. After consideration of risks, benefits and other options for treatment, the patient has consented to  Procedure(s): ARTERIOVENOUS (AV) FISTULA CREATION VERSUS GRAFT INSERTION (Right) as a surgical intervention .  The patient's history has been reviewed, patient examined, no change in status, stable for surgery.  I have reviewed the patient's chart and labs.  Questions were answered to the patient's satisfaction.     Deitra Mayo

## 2015-06-06 DIAGNOSIS — N186 End stage renal disease: Secondary | ICD-10-CM | POA: Diagnosis not present

## 2015-06-06 DIAGNOSIS — D631 Anemia in chronic kidney disease: Secondary | ICD-10-CM | POA: Diagnosis not present

## 2015-06-06 DIAGNOSIS — Z992 Dependence on renal dialysis: Secondary | ICD-10-CM | POA: Diagnosis not present

## 2015-06-06 DIAGNOSIS — D509 Iron deficiency anemia, unspecified: Secondary | ICD-10-CM | POA: Diagnosis not present

## 2015-06-06 DIAGNOSIS — N2581 Secondary hyperparathyroidism of renal origin: Secondary | ICD-10-CM | POA: Diagnosis not present

## 2015-06-06 LAB — TYPE AND SCREEN
ABO/RH(D): B POS
Antibody Screen: NEGATIVE
UNIT DIVISION: 0
Unit division: 0

## 2015-06-08 DIAGNOSIS — C689 Malignant neoplasm of urinary organ, unspecified: Secondary | ICD-10-CM | POA: Diagnosis not present

## 2015-06-08 DIAGNOSIS — C679 Malignant neoplasm of bladder, unspecified: Secondary | ICD-10-CM | POA: Diagnosis not present

## 2015-06-08 DIAGNOSIS — C649 Malignant neoplasm of unspecified kidney, except renal pelvis: Secondary | ICD-10-CM | POA: Diagnosis not present

## 2015-06-08 DIAGNOSIS — R591 Generalized enlarged lymph nodes: Secondary | ICD-10-CM | POA: Diagnosis not present

## 2015-06-09 ENCOUNTER — Encounter (HOSPITAL_COMMUNITY): Payer: Self-pay | Admitting: Vascular Surgery

## 2015-06-09 DIAGNOSIS — Z992 Dependence on renal dialysis: Secondary | ICD-10-CM | POA: Diagnosis not present

## 2015-06-09 DIAGNOSIS — D509 Iron deficiency anemia, unspecified: Secondary | ICD-10-CM | POA: Diagnosis not present

## 2015-06-09 DIAGNOSIS — N186 End stage renal disease: Secondary | ICD-10-CM | POA: Diagnosis not present

## 2015-06-09 DIAGNOSIS — D631 Anemia in chronic kidney disease: Secondary | ICD-10-CM | POA: Diagnosis not present

## 2015-06-09 DIAGNOSIS — N2581 Secondary hyperparathyroidism of renal origin: Secondary | ICD-10-CM | POA: Diagnosis not present

## 2015-06-10 ENCOUNTER — Telehealth: Payer: Self-pay | Admitting: Vascular Surgery

## 2015-06-10 NOTE — Telephone Encounter (Signed)
sched appt for 5/17, lab at [facility] at 11 and [facility] at 1. Spoke to pt's wife to inform pt of appts

## 2015-06-10 NOTE — Telephone Encounter (Signed)
-----   Message from Mena Goes, RN sent at 06/05/2015  1:58 PM EDT ----- Regarding: schedule   ----- Message -----    From: Angelia Mould, MD    Sent: 06/05/2015  12:56 PM      To: [facility] Subject: charge                                         PROCEDURE: Right brachial cephalic AV fistula  SURGEON: Judeth Cornfield. Scot Dock, MD, FACS  ASSIST: Delena Serve, RNFA  He will need a follow up duplex in 6 weeks with a visit at that time to check on the maturation of his fistula. Thank you. CD

## 2015-06-11 DIAGNOSIS — N2581 Secondary hyperparathyroidism of renal origin: Secondary | ICD-10-CM | POA: Diagnosis not present

## 2015-06-11 DIAGNOSIS — N186 End stage renal disease: Secondary | ICD-10-CM | POA: Diagnosis not present

## 2015-06-11 DIAGNOSIS — Z992 Dependence on renal dialysis: Secondary | ICD-10-CM | POA: Diagnosis not present

## 2015-06-11 DIAGNOSIS — D509 Iron deficiency anemia, unspecified: Secondary | ICD-10-CM | POA: Diagnosis not present

## 2015-06-11 DIAGNOSIS — D631 Anemia in chronic kidney disease: Secondary | ICD-10-CM | POA: Diagnosis not present

## 2015-06-13 DIAGNOSIS — N2581 Secondary hyperparathyroidism of renal origin: Secondary | ICD-10-CM | POA: Diagnosis not present

## 2015-06-13 DIAGNOSIS — N186 End stage renal disease: Secondary | ICD-10-CM | POA: Diagnosis not present

## 2015-06-13 DIAGNOSIS — D631 Anemia in chronic kidney disease: Secondary | ICD-10-CM | POA: Diagnosis not present

## 2015-06-13 DIAGNOSIS — D509 Iron deficiency anemia, unspecified: Secondary | ICD-10-CM | POA: Diagnosis not present

## 2015-06-13 DIAGNOSIS — Z992 Dependence on renal dialysis: Secondary | ICD-10-CM | POA: Diagnosis not present

## 2015-06-15 ENCOUNTER — Encounter (HOSPITAL_COMMUNITY): Payer: Medicare Other

## 2015-06-16 DIAGNOSIS — D631 Anemia in chronic kidney disease: Secondary | ICD-10-CM | POA: Diagnosis not present

## 2015-06-16 DIAGNOSIS — D509 Iron deficiency anemia, unspecified: Secondary | ICD-10-CM | POA: Diagnosis not present

## 2015-06-16 DIAGNOSIS — N2581 Secondary hyperparathyroidism of renal origin: Secondary | ICD-10-CM | POA: Diagnosis not present

## 2015-06-16 DIAGNOSIS — Z992 Dependence on renal dialysis: Secondary | ICD-10-CM | POA: Diagnosis not present

## 2015-06-16 DIAGNOSIS — N186 End stage renal disease: Secondary | ICD-10-CM | POA: Diagnosis not present

## 2015-06-17 ENCOUNTER — Telehealth: Payer: Self-pay | Admitting: Vascular Surgery

## 2015-06-17 DIAGNOSIS — H9313 Tinnitus, bilateral: Secondary | ICD-10-CM | POA: Diagnosis not present

## 2015-06-17 DIAGNOSIS — R42 Dizziness and giddiness: Secondary | ICD-10-CM | POA: Diagnosis not present

## 2015-06-17 DIAGNOSIS — H8111 Benign paroxysmal vertigo, right ear: Secondary | ICD-10-CM | POA: Diagnosis not present

## 2015-06-17 DIAGNOSIS — H903 Sensorineural hearing loss, bilateral: Secondary | ICD-10-CM | POA: Diagnosis not present

## 2015-06-17 NOTE — Telephone Encounter (Signed)
[facility] can not do dialysis duplexes. sched [facility] on [facility]. Lm on hm# to inform pt of change in u/s appt.

## 2015-06-18 DIAGNOSIS — D509 Iron deficiency anemia, unspecified: Secondary | ICD-10-CM | POA: Diagnosis not present

## 2015-06-18 DIAGNOSIS — Z992 Dependence on renal dialysis: Secondary | ICD-10-CM | POA: Diagnosis not present

## 2015-06-18 DIAGNOSIS — N2581 Secondary hyperparathyroidism of renal origin: Secondary | ICD-10-CM | POA: Diagnosis not present

## 2015-06-18 DIAGNOSIS — D631 Anemia in chronic kidney disease: Secondary | ICD-10-CM | POA: Diagnosis not present

## 2015-06-18 DIAGNOSIS — N186 End stage renal disease: Secondary | ICD-10-CM | POA: Diagnosis not present

## 2015-06-20 DIAGNOSIS — Z992 Dependence on renal dialysis: Secondary | ICD-10-CM | POA: Diagnosis not present

## 2015-06-20 DIAGNOSIS — N2581 Secondary hyperparathyroidism of renal origin: Secondary | ICD-10-CM | POA: Diagnosis not present

## 2015-06-20 DIAGNOSIS — D631 Anemia in chronic kidney disease: Secondary | ICD-10-CM | POA: Diagnosis not present

## 2015-06-20 DIAGNOSIS — N186 End stage renal disease: Secondary | ICD-10-CM | POA: Diagnosis not present

## 2015-06-20 DIAGNOSIS — D509 Iron deficiency anemia, unspecified: Secondary | ICD-10-CM | POA: Diagnosis not present

## 2015-06-23 DIAGNOSIS — Z992 Dependence on renal dialysis: Secondary | ICD-10-CM | POA: Diagnosis not present

## 2015-06-23 DIAGNOSIS — D631 Anemia in chronic kidney disease: Secondary | ICD-10-CM | POA: Diagnosis not present

## 2015-06-23 DIAGNOSIS — D509 Iron deficiency anemia, unspecified: Secondary | ICD-10-CM | POA: Diagnosis not present

## 2015-06-23 DIAGNOSIS — N186 End stage renal disease: Secondary | ICD-10-CM | POA: Diagnosis not present

## 2015-06-23 DIAGNOSIS — N2581 Secondary hyperparathyroidism of renal origin: Secondary | ICD-10-CM | POA: Diagnosis not present

## 2015-06-25 DIAGNOSIS — D509 Iron deficiency anemia, unspecified: Secondary | ICD-10-CM | POA: Diagnosis not present

## 2015-06-25 DIAGNOSIS — Z992 Dependence on renal dialysis: Secondary | ICD-10-CM | POA: Diagnosis not present

## 2015-06-25 DIAGNOSIS — D631 Anemia in chronic kidney disease: Secondary | ICD-10-CM | POA: Diagnosis not present

## 2015-06-25 DIAGNOSIS — N2581 Secondary hyperparathyroidism of renal origin: Secondary | ICD-10-CM | POA: Diagnosis not present

## 2015-06-25 DIAGNOSIS — N186 End stage renal disease: Secondary | ICD-10-CM | POA: Diagnosis not present

## 2015-06-27 DIAGNOSIS — D509 Iron deficiency anemia, unspecified: Secondary | ICD-10-CM | POA: Diagnosis not present

## 2015-06-27 DIAGNOSIS — D631 Anemia in chronic kidney disease: Secondary | ICD-10-CM | POA: Diagnosis not present

## 2015-06-27 DIAGNOSIS — Z992 Dependence on renal dialysis: Secondary | ICD-10-CM | POA: Diagnosis not present

## 2015-06-27 DIAGNOSIS — N186 End stage renal disease: Secondary | ICD-10-CM | POA: Diagnosis not present

## 2015-06-27 DIAGNOSIS — N2581 Secondary hyperparathyroidism of renal origin: Secondary | ICD-10-CM | POA: Diagnosis not present

## 2015-06-28 DIAGNOSIS — Z992 Dependence on renal dialysis: Secondary | ICD-10-CM | POA: Diagnosis not present

## 2015-06-28 DIAGNOSIS — N186 End stage renal disease: Secondary | ICD-10-CM | POA: Diagnosis not present

## 2015-06-30 DIAGNOSIS — D509 Iron deficiency anemia, unspecified: Secondary | ICD-10-CM | POA: Diagnosis not present

## 2015-06-30 DIAGNOSIS — D631 Anemia in chronic kidney disease: Secondary | ICD-10-CM | POA: Diagnosis not present

## 2015-06-30 DIAGNOSIS — N186 End stage renal disease: Secondary | ICD-10-CM | POA: Diagnosis not present

## 2015-06-30 DIAGNOSIS — Z992 Dependence on renal dialysis: Secondary | ICD-10-CM | POA: Diagnosis not present

## 2015-07-02 DIAGNOSIS — N186 End stage renal disease: Secondary | ICD-10-CM | POA: Diagnosis not present

## 2015-07-02 DIAGNOSIS — Z992 Dependence on renal dialysis: Secondary | ICD-10-CM | POA: Diagnosis not present

## 2015-07-02 DIAGNOSIS — D631 Anemia in chronic kidney disease: Secondary | ICD-10-CM | POA: Diagnosis not present

## 2015-07-02 DIAGNOSIS — D509 Iron deficiency anemia, unspecified: Secondary | ICD-10-CM | POA: Diagnosis not present

## 2015-07-03 ENCOUNTER — Encounter: Payer: Self-pay | Admitting: Vascular Surgery

## 2015-07-04 DIAGNOSIS — Z992 Dependence on renal dialysis: Secondary | ICD-10-CM | POA: Diagnosis not present

## 2015-07-04 DIAGNOSIS — D631 Anemia in chronic kidney disease: Secondary | ICD-10-CM | POA: Diagnosis not present

## 2015-07-04 DIAGNOSIS — N186 End stage renal disease: Secondary | ICD-10-CM | POA: Diagnosis not present

## 2015-07-04 DIAGNOSIS — D509 Iron deficiency anemia, unspecified: Secondary | ICD-10-CM | POA: Diagnosis not present

## 2015-07-07 DIAGNOSIS — Z992 Dependence on renal dialysis: Secondary | ICD-10-CM | POA: Diagnosis not present

## 2015-07-07 DIAGNOSIS — D631 Anemia in chronic kidney disease: Secondary | ICD-10-CM | POA: Diagnosis not present

## 2015-07-07 DIAGNOSIS — D509 Iron deficiency anemia, unspecified: Secondary | ICD-10-CM | POA: Diagnosis not present

## 2015-07-07 DIAGNOSIS — N186 End stage renal disease: Secondary | ICD-10-CM | POA: Diagnosis not present

## 2015-07-09 DIAGNOSIS — Z992 Dependence on renal dialysis: Secondary | ICD-10-CM | POA: Diagnosis not present

## 2015-07-09 DIAGNOSIS — N186 End stage renal disease: Secondary | ICD-10-CM | POA: Diagnosis not present

## 2015-07-09 DIAGNOSIS — D509 Iron deficiency anemia, unspecified: Secondary | ICD-10-CM | POA: Diagnosis not present

## 2015-07-09 DIAGNOSIS — D631 Anemia in chronic kidney disease: Secondary | ICD-10-CM | POA: Diagnosis not present

## 2015-07-10 ENCOUNTER — Ambulatory Visit (INDEPENDENT_AMBULATORY_CARE_PROVIDER_SITE_OTHER): Payer: Medicare Other | Admitting: Vascular Surgery

## 2015-07-10 ENCOUNTER — Ambulatory Visit (HOSPITAL_COMMUNITY)
Admission: RE | Admit: 2015-07-10 | Discharge: 2015-07-10 | Disposition: A | Discharge status: Home / Self Care | Payer: Medicare Other | Source: Ambulatory Visit | Attending: Vascular Surgery | Admitting: Vascular Surgery

## 2015-07-10 ENCOUNTER — Encounter: Payer: Self-pay | Admitting: Vascular Surgery

## 2015-07-10 VITALS — BP 173/81 | HR 56 | Temp 97.7°F | Resp 18 | Ht 70.0 in | Wt 255.0 lb

## 2015-07-10 DIAGNOSIS — N186 End stage renal disease: Secondary | ICD-10-CM

## 2015-07-10 DIAGNOSIS — I120 Hypertensive chronic kidney disease with stage 5 chronic kidney disease or end stage renal disease: Secondary | ICD-10-CM | POA: Insufficient documentation

## 2015-07-10 DIAGNOSIS — Z4931 Encounter for adequacy testing for hemodialysis: Secondary | ICD-10-CM | POA: Insufficient documentation

## 2015-07-10 NOTE — Progress Notes (Signed)
   Patient name: Jesus [name] MRN: [id number] DOB: 1950-10-09 Sex: male  REASON FOR VISIT: Follow up after right brachiocephalic AV fistula  HPI: Jesus [name] is a 65 y.o. male who is currently on dialysis via a right IJ tunneled dialysis catheter. I placed a right brachiocephalic fistula on [id number]. He comes in for a follow up visit. He denies any pain or paresthesias in his right arm.  Current Outpatient Prescriptions  Medication Sig Dispense Refill  . ALPRAZolam (XANAX) 1 MG tablet Take 1 mg by mouth 3 (three) times daily as needed for anxiety.     . metoprolol tartrate (LOPRESSOR) 25 MG tablet Take 25 mg by mouth 2 (two) times daily.    Marland Kitchen oxyCODONE-acetaminophen (ROXICET) 5-325 MG per tablet Take 1-2 tablets by mouth every 4 (four) hours as needed for severe pain. 50 tablet 0  . oxyCODONE-acetaminophen (ROXICET) 5-325 MG tablet Take 1-2 tablets by mouth every 4 (four) hours as needed. 30 tablet 0  . Polyethyl Glycol-Propyl Glycol (SYSTANE OP) Place 1 drop into both eyes daily as needed (for dry, itchy eyes).    . sevelamer carbonate (RENVELA) 800 MG tablet Take 1,600 mg by mouth 3 (three) times daily with meals.     No current facility-administered medications for this visit.    REVIEW OF SYSTEMS:  [X]  denotes positive finding, [ ]  denotes negative finding Cardiac  Comments:  Chest pain or chest pressure:    Shortness of breath upon exertion:    Short of breath when lying flat:    Irregular heart rhythm:    Constitutional    Fever or chills:      PHYSICAL EXAM: Filed Vitals:   07/10/15 1553 07/10/15 1557  BP: 169/80 173/81  Pulse: 56 56  Temp: 97.7 F (36.5 C)   Resp: 18   Height: 5\' 10"  (1.778 m)   Weight: 255 lb (115.667 kg)   SpO2: 100%     GENERAL: The patient is a well-nourished male, in no acute distress. The vital signs are documented above. CARDIOVASCULAR: There is a regular rate and rhythm. PULMONARY: There is good air exchange bilaterally without wheezing  or rales. He has a good bruit and thrill in his right upper arm fistula.  DUPLEX OF RIGHT BRACHIOCEPHALIC AV FISTULA: the diameters of the fistula range from 0.36-0.66 cm. Range from 0.35-0.49 cm. Diameters appear reasonable except for one slightly narrowed region in the mid upper arm.  MEDICAL ISSUES:  STATUS POST RIGHT BRACHIOCEPHALIC AV FISTULA: The fistula appears to be gradually maturing. I've ordered a follow duplex scan in 6 months. I'll see him back that time. If the vein continues to enlarge then I think at that point it will be ready for dialysis. If the narrowing in the central portion has progressed at all that I think we'll need to do a fistulogram and consider venoplasty.  Deitra Mayo [facility] [place]: [phone]

## 2015-07-10 NOTE — Progress Notes (Signed)
Filed Vitals:   07/10/15 1553 07/10/15 1557  BP: 169/80 173/81  Pulse: 56 56  Temp: 97.7 F (36.5 C)   Resp: 18   Height: 5\' 10"  (1.778 m)   Weight: 255 lb (115.667 kg)   SpO2: 100%

## 2015-07-11 DIAGNOSIS — D631 Anemia in chronic kidney disease: Secondary | ICD-10-CM | POA: Diagnosis not present

## 2015-07-11 DIAGNOSIS — N186 End stage renal disease: Secondary | ICD-10-CM | POA: Diagnosis not present

## 2015-07-11 DIAGNOSIS — D509 Iron deficiency anemia, unspecified: Secondary | ICD-10-CM | POA: Diagnosis not present

## 2015-07-11 DIAGNOSIS — Z992 Dependence on renal dialysis: Secondary | ICD-10-CM | POA: Diagnosis not present

## 2015-07-13 ENCOUNTER — Encounter (HOSPITAL_COMMUNITY): Payer: Medicare Other

## 2015-07-13 ENCOUNTER — Other Ambulatory Visit: Payer: Self-pay | Admitting: Family Medicine

## 2015-07-13 DIAGNOSIS — N186 End stage renal disease: Secondary | ICD-10-CM

## 2015-07-14 DIAGNOSIS — Z992 Dependence on renal dialysis: Secondary | ICD-10-CM | POA: Diagnosis not present

## 2015-07-14 DIAGNOSIS — N186 End stage renal disease: Secondary | ICD-10-CM | POA: Diagnosis not present

## 2015-07-14 DIAGNOSIS — D631 Anemia in chronic kidney disease: Secondary | ICD-10-CM | POA: Diagnosis not present

## 2015-07-14 DIAGNOSIS — D509 Iron deficiency anemia, unspecified: Secondary | ICD-10-CM | POA: Diagnosis not present

## 2015-07-15 ENCOUNTER — Encounter (HOSPITAL_COMMUNITY): Payer: Medicare Other

## 2015-07-15 ENCOUNTER — Encounter: Payer: Medicare Other | Admitting: Vascular Surgery

## 2015-07-16 ENCOUNTER — Ambulatory Visit (INDEPENDENT_AMBULATORY_CARE_PROVIDER_SITE_OTHER): Payer: Medicare Other | Admitting: Otolaryngology

## 2015-07-16 DIAGNOSIS — D509 Iron deficiency anemia, unspecified: Secondary | ICD-10-CM | POA: Diagnosis not present

## 2015-07-16 DIAGNOSIS — Z992 Dependence on renal dialysis: Secondary | ICD-10-CM | POA: Diagnosis not present

## 2015-07-16 DIAGNOSIS — N186 End stage renal disease: Secondary | ICD-10-CM | POA: Diagnosis not present

## 2015-07-16 DIAGNOSIS — D631 Anemia in chronic kidney disease: Secondary | ICD-10-CM | POA: Diagnosis not present

## 2015-07-18 DIAGNOSIS — Z992 Dependence on renal dialysis: Secondary | ICD-10-CM | POA: Diagnosis not present

## 2015-07-18 DIAGNOSIS — D509 Iron deficiency anemia, unspecified: Secondary | ICD-10-CM | POA: Diagnosis not present

## 2015-07-18 DIAGNOSIS — D631 Anemia in chronic kidney disease: Secondary | ICD-10-CM | POA: Diagnosis not present

## 2015-07-18 DIAGNOSIS — N186 End stage renal disease: Secondary | ICD-10-CM | POA: Diagnosis not present

## 2015-07-21 DIAGNOSIS — N186 End stage renal disease: Secondary | ICD-10-CM | POA: Diagnosis not present

## 2015-07-21 DIAGNOSIS — D509 Iron deficiency anemia, unspecified: Secondary | ICD-10-CM | POA: Diagnosis not present

## 2015-07-21 DIAGNOSIS — D631 Anemia in chronic kidney disease: Secondary | ICD-10-CM | POA: Diagnosis not present

## 2015-07-21 DIAGNOSIS — Z992 Dependence on renal dialysis: Secondary | ICD-10-CM | POA: Diagnosis not present

## 2015-07-23 DIAGNOSIS — N186 End stage renal disease: Secondary | ICD-10-CM | POA: Diagnosis not present

## 2015-07-23 DIAGNOSIS — D631 Anemia in chronic kidney disease: Secondary | ICD-10-CM | POA: Diagnosis not present

## 2015-07-23 DIAGNOSIS — Z992 Dependence on renal dialysis: Secondary | ICD-10-CM | POA: Diagnosis not present

## 2015-07-23 DIAGNOSIS — D509 Iron deficiency anemia, unspecified: Secondary | ICD-10-CM | POA: Diagnosis not present

## 2015-07-25 DIAGNOSIS — Z992 Dependence on renal dialysis: Secondary | ICD-10-CM | POA: Diagnosis not present

## 2015-07-25 DIAGNOSIS — D509 Iron deficiency anemia, unspecified: Secondary | ICD-10-CM | POA: Diagnosis not present

## 2015-07-25 DIAGNOSIS — N186 End stage renal disease: Secondary | ICD-10-CM | POA: Diagnosis not present

## 2015-07-25 DIAGNOSIS — D631 Anemia in chronic kidney disease: Secondary | ICD-10-CM | POA: Diagnosis not present

## 2015-07-28 DIAGNOSIS — D509 Iron deficiency anemia, unspecified: Secondary | ICD-10-CM | POA: Diagnosis not present

## 2015-07-28 DIAGNOSIS — Z992 Dependence on renal dialysis: Secondary | ICD-10-CM | POA: Diagnosis not present

## 2015-07-28 DIAGNOSIS — D631 Anemia in chronic kidney disease: Secondary | ICD-10-CM | POA: Diagnosis not present

## 2015-07-28 DIAGNOSIS — N186 End stage renal disease: Secondary | ICD-10-CM | POA: Diagnosis not present

## 2015-07-29 DIAGNOSIS — N186 End stage renal disease: Secondary | ICD-10-CM | POA: Diagnosis not present

## 2015-07-29 DIAGNOSIS — Z992 Dependence on renal dialysis: Secondary | ICD-10-CM | POA: Diagnosis not present

## 2015-07-30 DIAGNOSIS — N186 End stage renal disease: Secondary | ICD-10-CM | POA: Diagnosis not present

## 2015-07-30 DIAGNOSIS — N2581 Secondary hyperparathyroidism of renal origin: Secondary | ICD-10-CM | POA: Diagnosis not present

## 2015-07-30 DIAGNOSIS — Z992 Dependence on renal dialysis: Secondary | ICD-10-CM | POA: Diagnosis not present

## 2015-07-30 DIAGNOSIS — D509 Iron deficiency anemia, unspecified: Secondary | ICD-10-CM | POA: Diagnosis not present

## 2015-07-30 DIAGNOSIS — D631 Anemia in chronic kidney disease: Secondary | ICD-10-CM | POA: Diagnosis not present

## 2015-08-01 DIAGNOSIS — Z992 Dependence on renal dialysis: Secondary | ICD-10-CM | POA: Diagnosis not present

## 2015-08-01 DIAGNOSIS — D509 Iron deficiency anemia, unspecified: Secondary | ICD-10-CM | POA: Diagnosis not present

## 2015-08-01 DIAGNOSIS — N186 End stage renal disease: Secondary | ICD-10-CM | POA: Diagnosis not present

## 2015-08-01 DIAGNOSIS — D631 Anemia in chronic kidney disease: Secondary | ICD-10-CM | POA: Diagnosis not present

## 2015-08-01 DIAGNOSIS — N2581 Secondary hyperparathyroidism of renal origin: Secondary | ICD-10-CM | POA: Diagnosis not present

## 2015-08-04 DIAGNOSIS — N2581 Secondary hyperparathyroidism of renal origin: Secondary | ICD-10-CM | POA: Diagnosis not present

## 2015-08-04 DIAGNOSIS — D509 Iron deficiency anemia, unspecified: Secondary | ICD-10-CM | POA: Diagnosis not present

## 2015-08-04 DIAGNOSIS — D631 Anemia in chronic kidney disease: Secondary | ICD-10-CM | POA: Diagnosis not present

## 2015-08-04 DIAGNOSIS — N186 End stage renal disease: Secondary | ICD-10-CM | POA: Diagnosis not present

## 2015-08-04 DIAGNOSIS — Z992 Dependence on renal dialysis: Secondary | ICD-10-CM | POA: Diagnosis not present

## 2015-08-05 DIAGNOSIS — M5020 Other cervical disc displacement, unspecified cervical region: Secondary | ICD-10-CM | POA: Diagnosis not present

## 2015-08-05 DIAGNOSIS — M47816 Spondylosis without myelopathy or radiculopathy, lumbar region: Secondary | ICD-10-CM | POA: Diagnosis not present

## 2015-08-06 DIAGNOSIS — N2581 Secondary hyperparathyroidism of renal origin: Secondary | ICD-10-CM | POA: Diagnosis not present

## 2015-08-06 DIAGNOSIS — D631 Anemia in chronic kidney disease: Secondary | ICD-10-CM | POA: Diagnosis not present

## 2015-08-06 DIAGNOSIS — D509 Iron deficiency anemia, unspecified: Secondary | ICD-10-CM | POA: Diagnosis not present

## 2015-08-06 DIAGNOSIS — Z992 Dependence on renal dialysis: Secondary | ICD-10-CM | POA: Diagnosis not present

## 2015-08-06 DIAGNOSIS — N186 End stage renal disease: Secondary | ICD-10-CM | POA: Diagnosis not present

## 2015-08-08 DIAGNOSIS — Z992 Dependence on renal dialysis: Secondary | ICD-10-CM | POA: Diagnosis not present

## 2015-08-08 DIAGNOSIS — D509 Iron deficiency anemia, unspecified: Secondary | ICD-10-CM | POA: Diagnosis not present

## 2015-08-08 DIAGNOSIS — N186 End stage renal disease: Secondary | ICD-10-CM | POA: Diagnosis not present

## 2015-08-08 DIAGNOSIS — N2581 Secondary hyperparathyroidism of renal origin: Secondary | ICD-10-CM | POA: Diagnosis not present

## 2015-08-08 DIAGNOSIS — D631 Anemia in chronic kidney disease: Secondary | ICD-10-CM | POA: Diagnosis not present

## 2015-08-11 DIAGNOSIS — D509 Iron deficiency anemia, unspecified: Secondary | ICD-10-CM | POA: Diagnosis not present

## 2015-08-11 DIAGNOSIS — D631 Anemia in chronic kidney disease: Secondary | ICD-10-CM | POA: Diagnosis not present

## 2015-08-11 DIAGNOSIS — Z992 Dependence on renal dialysis: Secondary | ICD-10-CM | POA: Diagnosis not present

## 2015-08-11 DIAGNOSIS — N2581 Secondary hyperparathyroidism of renal origin: Secondary | ICD-10-CM | POA: Diagnosis not present

## 2015-08-11 DIAGNOSIS — N186 End stage renal disease: Secondary | ICD-10-CM | POA: Diagnosis not present

## 2015-08-13 DIAGNOSIS — D509 Iron deficiency anemia, unspecified: Secondary | ICD-10-CM | POA: Diagnosis not present

## 2015-08-13 DIAGNOSIS — D631 Anemia in chronic kidney disease: Secondary | ICD-10-CM | POA: Diagnosis not present

## 2015-08-13 DIAGNOSIS — N186 End stage renal disease: Secondary | ICD-10-CM | POA: Diagnosis not present

## 2015-08-13 DIAGNOSIS — N2581 Secondary hyperparathyroidism of renal origin: Secondary | ICD-10-CM | POA: Diagnosis not present

## 2015-08-13 DIAGNOSIS — Z992 Dependence on renal dialysis: Secondary | ICD-10-CM | POA: Diagnosis not present

## 2015-08-15 DIAGNOSIS — N186 End stage renal disease: Secondary | ICD-10-CM | POA: Diagnosis not present

## 2015-08-15 DIAGNOSIS — D631 Anemia in chronic kidney disease: Secondary | ICD-10-CM | POA: Diagnosis not present

## 2015-08-15 DIAGNOSIS — N2581 Secondary hyperparathyroidism of renal origin: Secondary | ICD-10-CM | POA: Diagnosis not present

## 2015-08-15 DIAGNOSIS — D509 Iron deficiency anemia, unspecified: Secondary | ICD-10-CM | POA: Diagnosis not present

## 2015-08-15 DIAGNOSIS — Z992 Dependence on renal dialysis: Secondary | ICD-10-CM | POA: Diagnosis not present

## 2015-08-18 DIAGNOSIS — Z992 Dependence on renal dialysis: Secondary | ICD-10-CM | POA: Diagnosis not present

## 2015-08-18 DIAGNOSIS — N186 End stage renal disease: Secondary | ICD-10-CM | POA: Diagnosis not present

## 2015-08-18 DIAGNOSIS — N2581 Secondary hyperparathyroidism of renal origin: Secondary | ICD-10-CM | POA: Diagnosis not present

## 2015-08-18 DIAGNOSIS — D631 Anemia in chronic kidney disease: Secondary | ICD-10-CM | POA: Diagnosis not present

## 2015-08-18 DIAGNOSIS — D509 Iron deficiency anemia, unspecified: Secondary | ICD-10-CM | POA: Diagnosis not present

## 2015-08-19 ENCOUNTER — Encounter: Payer: Self-pay | Admitting: Vascular Surgery

## 2015-08-20 DIAGNOSIS — N186 End stage renal disease: Secondary | ICD-10-CM | POA: Diagnosis not present

## 2015-08-20 DIAGNOSIS — Z992 Dependence on renal dialysis: Secondary | ICD-10-CM | POA: Diagnosis not present

## 2015-08-20 DIAGNOSIS — N2581 Secondary hyperparathyroidism of renal origin: Secondary | ICD-10-CM | POA: Diagnosis not present

## 2015-08-20 DIAGNOSIS — D509 Iron deficiency anemia, unspecified: Secondary | ICD-10-CM | POA: Diagnosis not present

## 2015-08-20 DIAGNOSIS — D631 Anemia in chronic kidney disease: Secondary | ICD-10-CM | POA: Diagnosis not present

## 2015-08-22 DIAGNOSIS — N2581 Secondary hyperparathyroidism of renal origin: Secondary | ICD-10-CM | POA: Diagnosis not present

## 2015-08-22 DIAGNOSIS — Z992 Dependence on renal dialysis: Secondary | ICD-10-CM | POA: Diagnosis not present

## 2015-08-22 DIAGNOSIS — D631 Anemia in chronic kidney disease: Secondary | ICD-10-CM | POA: Diagnosis not present

## 2015-08-22 DIAGNOSIS — D509 Iron deficiency anemia, unspecified: Secondary | ICD-10-CM | POA: Diagnosis not present

## 2015-08-22 DIAGNOSIS — N186 End stage renal disease: Secondary | ICD-10-CM | POA: Diagnosis not present

## 2015-08-24 ENCOUNTER — Ambulatory Visit (INDEPENDENT_AMBULATORY_CARE_PROVIDER_SITE_OTHER): Payer: Medicare Other | Admitting: Otolaryngology

## 2015-08-24 DIAGNOSIS — R42 Dizziness and giddiness: Secondary | ICD-10-CM | POA: Diagnosis not present

## 2015-08-25 DIAGNOSIS — D509 Iron deficiency anemia, unspecified: Secondary | ICD-10-CM | POA: Diagnosis not present

## 2015-08-25 DIAGNOSIS — N186 End stage renal disease: Secondary | ICD-10-CM | POA: Diagnosis not present

## 2015-08-25 DIAGNOSIS — N2581 Secondary hyperparathyroidism of renal origin: Secondary | ICD-10-CM | POA: Diagnosis not present

## 2015-08-25 DIAGNOSIS — D631 Anemia in chronic kidney disease: Secondary | ICD-10-CM | POA: Diagnosis not present

## 2015-08-25 DIAGNOSIS — Z992 Dependence on renal dialysis: Secondary | ICD-10-CM | POA: Diagnosis not present

## 2015-08-26 ENCOUNTER — Ambulatory Visit (HOSPITAL_COMMUNITY)
Admission: RE | Admit: 2015-08-26 | Discharge: 2015-08-26 | Disposition: A | Discharge status: Home / Self Care | Payer: Medicare Other | Source: Ambulatory Visit | Attending: Vascular Surgery | Admitting: Vascular Surgery

## 2015-08-26 ENCOUNTER — Ambulatory Visit (INDEPENDENT_AMBULATORY_CARE_PROVIDER_SITE_OTHER): Payer: Medicare Other | Admitting: Vascular Surgery

## 2015-08-26 ENCOUNTER — Other Ambulatory Visit: Payer: Self-pay

## 2015-08-26 ENCOUNTER — Encounter: Payer: Self-pay | Admitting: Vascular Surgery

## 2015-08-26 VITALS — BP 167/79 | HR 50 | Temp 97.2°F | Resp 14 | Ht 70.0 in | Wt 255.0 lb

## 2015-08-26 DIAGNOSIS — N186 End stage renal disease: Secondary | ICD-10-CM

## 2015-08-26 DIAGNOSIS — I110 Hypertensive heart disease with heart failure: Secondary | ICD-10-CM | POA: Diagnosis not present

## 2015-08-26 DIAGNOSIS — F419 Anxiety disorder, unspecified: Secondary | ICD-10-CM | POA: Diagnosis not present

## 2015-08-26 NOTE — Progress Notes (Signed)
Patient name: Jesus [name] MRN: [id number] DOB: 1951-01-26 Sex: male  REASON FOR VISIT: Follow up of right brachiocephalic AV fistula  HPI: Jesus [name] is a 65 y.o. male he had a right brachiocephalic AV fistula performed on 06/05/2015. He had a 3.5 mm upper arm cephalic vein. I last saw him in follow up on 07/10/2015. Diameters of the fistula ranged from 0.36-0.66 cm. There was one area of narrowing in the mid upper arm. He comes in for a 6 week follow up visit. Of note he is on dialysis Tuesdays Thursdays and Saturdays and has a functioning catheter.  He denies pain or paresthesias in his right arm. His catheter is working well.  Current Outpatient Prescriptions  Medication Sig Dispense Refill  . ALPRAZolam (XANAX) 1 MG tablet Take 1 mg by mouth 3 (three) times daily as needed for anxiety.     . metoprolol tartrate (LOPRESSOR) 25 MG tablet Take 25 mg by mouth 2 (two) times daily.    Marland Kitchen oxyCODONE-acetaminophen (ROXICET) 5-325 MG tablet Take 1-2 tablets by mouth every 4 (four) hours as needed. 30 tablet 0  . Polyethyl Glycol-Propyl Glycol (SYSTANE OP) Place 1 drop into both eyes daily as needed (for dry, itchy eyes).    . sevelamer carbonate (RENVELA) 800 MG tablet Take 1,600 mg by mouth 3 (three) times daily with meals.     No current facility-administered medications for this visit.    REVIEW OF SYSTEMS:  [X]  denotes positive finding, [ ]  denotes negative finding Cardiac  Comments:  Chest pain or chest pressure:    Shortness of breath upon exertion: X   Short of breath when lying flat:    Irregular heart rhythm:    Constitutional    Fever or chills:      PHYSICAL EXAM: Filed Vitals:   08/26/15 1052 08/26/15 1058  BP: 162/76 167/79  Pulse: 48 50  Temp: 97.2 F (36.2 C)   TempSrc: Oral   Resp: 14   Height: 5\' 10"  (1.778 m)   Weight: 255 lb (115.667 kg)   SpO2: 98%     GENERAL: The patient is a well-nourished male, in no acute distress. The vital signs are documented  above. CARDIOVASCULAR: There is a regular rate and rhythm. PULMONARY: There is good air exchange bilaterally without wheezing or rales. This fistula is somewhat pulsatile proximally. There appears to be some scar tissue in the mid upper arm to distal third of the upper arm.  DUPLEX OF DIALYSIS FISTULA: I have independently interpreted the duplex of his right brachiocephalic AV fistula. The diameters range from 0.25-0.5 cm. There is an area of narrowing in the fistula in the distal third of the upper arm. Peak systolic velocities [id number] cm/s with a ratio of 6.0. There are also some elevated velocities at the proximal anastomosis.  MEDICAL ISSUES:  STATUS POST RIGHT BRACHIOCEPHALIC AV FISTULA:  His fistula is not maturing adequately. For this reason I recommended that we proceed with a fistulogram. Hopefully we'll find disease amenable to venoplasty to help facilitate maturation of his fistula. This procedure has been scheduled for July 10. I'll make further recommendations pending these results. We have discussed the procedure potential palpitations and he is agreeable to proceed.  HYPERTENSION: The patient's initial blood pressure today was elevated. We repeated this and this was still elevated. We have encouraged the patient to follow up with their primary care physician for management of their blood pressure.   Deitra Mayo [facility] [place]  Beeper [phone]

## 2015-08-27 DIAGNOSIS — N2581 Secondary hyperparathyroidism of renal origin: Secondary | ICD-10-CM | POA: Diagnosis not present

## 2015-08-27 DIAGNOSIS — D631 Anemia in chronic kidney disease: Secondary | ICD-10-CM | POA: Diagnosis not present

## 2015-08-27 DIAGNOSIS — Z992 Dependence on renal dialysis: Secondary | ICD-10-CM | POA: Diagnosis not present

## 2015-08-27 DIAGNOSIS — N186 End stage renal disease: Secondary | ICD-10-CM | POA: Diagnosis not present

## 2015-08-27 DIAGNOSIS — D509 Iron deficiency anemia, unspecified: Secondary | ICD-10-CM | POA: Diagnosis not present

## 2015-08-28 DIAGNOSIS — Z992 Dependence on renal dialysis: Secondary | ICD-10-CM | POA: Diagnosis not present

## 2015-08-28 DIAGNOSIS — N186 End stage renal disease: Secondary | ICD-10-CM | POA: Diagnosis not present

## 2015-08-29 DIAGNOSIS — D509 Iron deficiency anemia, unspecified: Secondary | ICD-10-CM | POA: Diagnosis not present

## 2015-08-29 DIAGNOSIS — Z992 Dependence on renal dialysis: Secondary | ICD-10-CM | POA: Diagnosis not present

## 2015-08-29 DIAGNOSIS — N186 End stage renal disease: Secondary | ICD-10-CM | POA: Diagnosis not present

## 2015-08-29 DIAGNOSIS — D631 Anemia in chronic kidney disease: Secondary | ICD-10-CM | POA: Diagnosis not present

## 2015-08-29 DIAGNOSIS — N2581 Secondary hyperparathyroidism of renal origin: Secondary | ICD-10-CM | POA: Diagnosis not present

## 2015-09-01 DIAGNOSIS — D631 Anemia in chronic kidney disease: Secondary | ICD-10-CM | POA: Diagnosis not present

## 2015-09-01 DIAGNOSIS — N186 End stage renal disease: Secondary | ICD-10-CM | POA: Diagnosis not present

## 2015-09-01 DIAGNOSIS — D509 Iron deficiency anemia, unspecified: Secondary | ICD-10-CM | POA: Diagnosis not present

## 2015-09-01 DIAGNOSIS — Z992 Dependence on renal dialysis: Secondary | ICD-10-CM | POA: Diagnosis not present

## 2015-09-01 DIAGNOSIS — N2581 Secondary hyperparathyroidism of renal origin: Secondary | ICD-10-CM | POA: Diagnosis not present

## 2015-09-03 DIAGNOSIS — D631 Anemia in chronic kidney disease: Secondary | ICD-10-CM | POA: Diagnosis not present

## 2015-09-03 DIAGNOSIS — Z992 Dependence on renal dialysis: Secondary | ICD-10-CM | POA: Diagnosis not present

## 2015-09-03 DIAGNOSIS — D509 Iron deficiency anemia, unspecified: Secondary | ICD-10-CM | POA: Diagnosis not present

## 2015-09-03 DIAGNOSIS — N2581 Secondary hyperparathyroidism of renal origin: Secondary | ICD-10-CM | POA: Diagnosis not present

## 2015-09-03 DIAGNOSIS — N186 End stage renal disease: Secondary | ICD-10-CM | POA: Diagnosis not present

## 2015-09-04 ENCOUNTER — Encounter (HOSPITAL_COMMUNITY): Payer: Self-pay | Admitting: Pharmacy Technician

## 2015-09-05 DIAGNOSIS — D631 Anemia in chronic kidney disease: Secondary | ICD-10-CM | POA: Diagnosis not present

## 2015-09-05 DIAGNOSIS — N2581 Secondary hyperparathyroidism of renal origin: Secondary | ICD-10-CM | POA: Diagnosis not present

## 2015-09-05 DIAGNOSIS — D509 Iron deficiency anemia, unspecified: Secondary | ICD-10-CM | POA: Diagnosis not present

## 2015-09-05 DIAGNOSIS — Z992 Dependence on renal dialysis: Secondary | ICD-10-CM | POA: Diagnosis not present

## 2015-09-05 DIAGNOSIS — N186 End stage renal disease: Secondary | ICD-10-CM | POA: Diagnosis not present

## 2015-09-07 ENCOUNTER — Ambulatory Visit (HOSPITAL_COMMUNITY)
Admission: RE | Admit: 2015-09-07 | Discharge: 2015-09-07 | Disposition: A | Discharge status: Home / Self Care | Payer: Medicare Other | Source: Ambulatory Visit | Attending: Vascular Surgery | Admitting: Vascular Surgery

## 2015-09-07 ENCOUNTER — Encounter (HOSPITAL_COMMUNITY): Payer: Self-pay | Admitting: Vascular Surgery

## 2015-09-07 ENCOUNTER — Encounter (HOSPITAL_COMMUNITY)
Admission: RE | Disposition: A | Discharge status: Home / Self Care | Payer: Self-pay | Source: Ambulatory Visit | Attending: Vascular Surgery

## 2015-09-07 DIAGNOSIS — Y832 Surgical operation with anastomosis, bypass or graft as the cause of abnormal reaction of the patient, or of later complication, without mention of misadventure at the time of the procedure: Secondary | ICD-10-CM | POA: Diagnosis not present

## 2015-09-07 DIAGNOSIS — I120 Hypertensive chronic kidney disease with stage 5 chronic kidney disease or end stage renal disease: Secondary | ICD-10-CM | POA: Insufficient documentation

## 2015-09-07 DIAGNOSIS — T82858A Stenosis of vascular prosthetic devices, implants and grafts, initial encounter: Secondary | ICD-10-CM | POA: Diagnosis not present

## 2015-09-07 DIAGNOSIS — N186 End stage renal disease: Secondary | ICD-10-CM | POA: Diagnosis not present

## 2015-09-07 DIAGNOSIS — T82898A Other specified complication of vascular prosthetic devices, implants and grafts, initial encounter: Secondary | ICD-10-CM | POA: Diagnosis not present

## 2015-09-07 DIAGNOSIS — N185 Chronic kidney disease, stage 5: Secondary | ICD-10-CM | POA: Diagnosis not present

## 2015-09-07 DIAGNOSIS — Z992 Dependence on renal dialysis: Secondary | ICD-10-CM | POA: Diagnosis not present

## 2015-09-07 HISTORY — PX: PERIPHERAL VASCULAR CATHETERIZATION: SHX172C

## 2015-09-07 LAB — POCT I-STAT, CHEM 8
BUN: 45 mg/dL — ABNORMAL HIGH (ref 6–20)
CHLORIDE: 98 mmol/L — AB (ref 101–111)
CREATININE: 9.8 mg/dL — AB (ref 0.61–1.24)
Calcium, Ion: 1.17 mmol/L (ref 1.12–1.23)
Glucose, Bld: 74 mg/dL (ref 65–99)
HEMATOCRIT: 33 % — AB (ref 39.0–52.0)
HEMOGLOBIN: 11.2 g/dL — AB (ref 13.0–17.0)
POTASSIUM: 4.4 mmol/L (ref 3.5–5.1)
Sodium: 136 mmol/L (ref 135–145)
TCO2: 26 mmol/L (ref 0–100)

## 2015-09-07 SURGERY — A/V SHUNTOGRAM/FISTULAGRAM
Anesthesia: LOCAL | Laterality: Right

## 2015-09-07 MED ORDER — FENTANYL CITRATE (PF) 100 MCG/2ML IJ SOLN
INTRAMUSCULAR | Status: AC
  Filled 2015-09-07: qty 2

## 2015-09-07 MED ORDER — IODIXANOL 320 MG/ML IV SOLN
INTRAVENOUS | Status: DC | PRN
  Administered 2015-09-07: 160 mL via INTRAVENOUS

## 2015-09-07 MED ORDER — HEPARIN (PORCINE) IN NACL 2-0.9 UNIT/ML-% IJ SOLN
INTRAMUSCULAR | Status: DC | PRN
  Administered 2015-09-07: 500 mL

## 2015-09-07 MED ORDER — FENTANYL CITRATE (PF) 100 MCG/2ML IJ SOLN
INTRAMUSCULAR | Status: DC | PRN
  Administered 2015-09-07: 50 ug via INTRAVENOUS

## 2015-09-07 MED ORDER — HEPARIN SODIUM (PORCINE) 1000 UNIT/ML IJ SOLN
INTRAMUSCULAR | Status: DC | PRN
  Administered 2015-09-07: 5000 [IU] via INTRAVENOUS

## 2015-09-07 MED ORDER — MIDAZOLAM HCL 2 MG/2ML IJ SOLN
INTRAMUSCULAR | Status: DC | PRN
  Administered 2015-09-07: 1 mg via INTRAVENOUS

## 2015-09-07 MED ORDER — MIDAZOLAM HCL 2 MG/2ML IJ SOLN
INTRAMUSCULAR | Status: AC
  Filled 2015-09-07: qty 2

## 2015-09-07 MED ORDER — SODIUM CHLORIDE 0.9% FLUSH
3.0000 mL | INTRAVENOUS | Status: DC | PRN
Start: 2015-09-07 — End: 2015-09-07
  Administered 2015-09-07: 3 mL via INTRAVENOUS
  Filled 2015-09-07: qty 3

## 2015-09-07 MED ORDER — LIDOCAINE HCL (PF) 1 % IJ SOLN
INTRAMUSCULAR | Status: AC
  Filled 2015-09-07: qty 30

## 2015-09-07 MED ORDER — HEPARIN (PORCINE) IN NACL 2-0.9 UNIT/ML-% IJ SOLN
INTRAMUSCULAR | Status: AC
  Filled 2015-09-07: qty 500

## 2015-09-07 MED ORDER — HEPARIN SODIUM (PORCINE) 1000 UNIT/ML IJ SOLN
INTRAMUSCULAR | Status: AC
  Filled 2015-09-07: qty 1

## 2015-09-07 MED ORDER — LIDOCAINE HCL (PF) 1 % IJ SOLN
INTRAMUSCULAR | Status: DC | PRN
  Administered 2015-09-07: 5 mL

## 2015-09-07 SURGICAL SUPPLY — 17 items
BAG SNAP BAND KOVER 36X36 (MISCELLANEOUS) ×2 IMPLANT
BALLN MUSTANG 5X60X75 (BALLOONS) ×2
BALLN MUSTANG 6X60X75 (BALLOONS) ×2
BALLOON MUSTANG 5X60X75 (BALLOONS) ×1 IMPLANT
BALLOON MUSTANG 6X60X75 (BALLOONS) ×1 IMPLANT
COVER DOME SNAP 22 D (MISCELLANEOUS) ×2 IMPLANT
COVER PRB 48X5XTLSCP FOLD TPE (BAG) ×1 IMPLANT
COVER PROBE 5X48 (BAG) ×1
KIT ENCORE 26 ADVANTAGE (KITS) ×2 IMPLANT
KIT MICROINTRODUCER STIFF 5F (SHEATH) ×2 IMPLANT
PROTECTION STATION PRESSURIZED (MISCELLANEOUS) ×2
SHEATH PINNACLE R/O II 6F 4CM (SHEATH) ×2 IMPLANT
STATION PROTECTION PRESSURIZED (MISCELLANEOUS) ×1 IMPLANT
STOPCOCK MORSE 400PSI 3WAY (MISCELLANEOUS) ×2 IMPLANT
TRAY PV CATH (CUSTOM PROCEDURE TRAY) ×2 IMPLANT
TUBING CIL FLEX 10 FLL-RA (TUBING) ×2 IMPLANT
WIRE BENTSON .035X145CM (WIRE) ×2 IMPLANT

## 2015-09-07 NOTE — H&P (View-Only) (Signed)
Patient name: Jesus [name] MRN: [id number] DOB: 05/15/1950 Sex: male  REASON FOR VISIT: Follow up of right brachiocephalic AV fistula  HPI: [name] is a 65 y.o. male he had a right brachiocephalic AV fistula performed on 06/05/2015. He had a 3.5 mm upper arm cephalic vein. I last saw him in follow up on 07/10/2015. Diameters of the fistula ranged from 0.36-0.66 cm. There was one area of narrowing in the mid upper arm. He comes in for a 6 week follow up visit. Of note he is on dialysis Tuesdays Thursdays and Saturdays and has a functioning catheter.  He denies pain or paresthesias in his right arm. His catheter is working well.  Current Outpatient Prescriptions  Medication Sig Dispense Refill  . ALPRAZolam (XANAX) 1 MG tablet Take 1 mg by mouth 3 (three) times daily as needed for anxiety.     . metoprolol tartrate (LOPRESSOR) 25 MG tablet Take 25 mg by mouth 2 (two) times daily.    Marland Kitchen oxyCODONE-acetaminophen (ROXICET) 5-325 MG tablet Take 1-2 tablets by mouth every 4 (four) hours as needed. 30 tablet 0  . Polyethyl Glycol-Propyl Glycol (SYSTANE OP) Place 1 drop into both eyes daily as needed (for dry, itchy eyes).    . sevelamer carbonate (RENVELA) 800 MG tablet Take 1,600 mg by mouth 3 (three) times daily with meals.     No current facility-administered medications for this visit.    REVIEW OF SYSTEMS:  [X]  denotes positive finding, [ ]  denotes negative finding Cardiac  Comments:  Chest pain or chest pressure:    Shortness of breath upon exertion: X   Short of breath when lying flat:    Irregular heart rhythm:    Constitutional    Fever or chills:      PHYSICAL EXAM: Filed Vitals:   08/26/15 1052 08/26/15 1058  BP: 162/76 167/79  Pulse: 48 50  Temp: 97.2 F (36.2 C)   TempSrc: Oral   Resp: 14   Height: 5\' 10"  (1.778 m)   Weight: 255 lb (115.667 kg)   SpO2: 98%     GENERAL: The patient is a well-nourished male, in no acute distress. The vital signs are documented  above. CARDIOVASCULAR: There is a regular rate and rhythm. PULMONARY: There is good air exchange bilaterally without wheezing or rales. This fistula is somewhat pulsatile proximally. There appears to be some scar tissue in the mid upper arm to distal third of the upper arm.  DUPLEX OF DIALYSIS FISTULA: I have independently interpreted the duplex of his right brachiocephalic AV fistula. The diameters range from 0.25-0.5 cm. There is an area of narrowing in the fistula in the distal third of the upper arm. Peak systolic velocities A999333 cm/s with a ratio of 6.0. There are also some elevated velocities at the proximal anastomosis.  MEDICAL ISSUES:  STATUS POST RIGHT BRACHIOCEPHALIC AV FISTULA:  His fistula is not maturing adequately. For this reason I recommended that we proceed with a fistulogram. Hopefully we'll find disease amenable to venoplasty to help facilitate maturation of his fistula. This procedure has been scheduled for July 10. I'll make further recommendations pending these results. We have discussed the procedure potential palpitations and he is agreeable to proceed.  HYPERTENSION: The patient's initial blood pressure today was elevated. We repeated this and this was still elevated. We have encouraged the patient to follow up with their primary care physician for management of their blood pressure.   Deitra Mayo [facility] [place]  Beeper [phone]

## 2015-09-07 NOTE — Op Note (Signed)
   PATIENT: Jesus [name]   MRN: [id number] DOB: 07-Aug-1950    DATE OF PROCEDURE: 09/07/2015  INDICATIONS: Jesus [name] is a 65 y.o. male who had a right brachiocephalic fistula placed in April of this year. Follow up duplex showed that the diameters range from 0.25-0.5 cm.  Given that the fistula was not maturing adequately hemostatic for fistulogram and possible venoplasty. He has a functioning catheter.  PROCEDURE:  1. Ultrasound-guided access of right brachiocephalic AV fistula 2. Fistulogram right brachiocephalic AV fistula 3. Venoplasty right brachiocephalic AV fistula  SURGEON: Judeth Cornfield. Scot Dock, MD, FACS  ANESTHESIA: local with sedation   EBL: minimal  TECHNIQUE: The patient was taken to the peripheral vascular lab and was sedated. The period of conscious sedation began at (9:56 AM) and ended at (10:13 AM).  During that time period, I was present face-to-face 100% of the time.  The patient was administered (11 mg of Versed and 50 g of fentanyl.). The patient's heart rate, blood pressure, and oxygen saturation were monitored by the nurse continuously during the procedure.  tthe right arm was prepped and draped in usual sterile fashion. Under ultrasound guidance, after the skin was anesthetized, the proximal fistula was cannulated with a micropuncture needle and a micropuncture sheath introduced over a wire. A fistulogram was obtained obtained evaluating the fistula from the cannulation site to the central veins. There was a 70% stenosis in the central portion of the fistula and I elected to address this with venoplasty.  The micropuncture sheath was exchanged for a short 6 [place] sheath over a [employer] wire. The patient received 5000 units of IV heparin. A 5 mm x 6 cm Mustang balloon was selected and this was inflated to 24 atm for 2 minutes after it was positioned across the stenosis. ith the balloon inflated we did do 1 reflux shot to evaluate the proximal fistula and arterial  anastomosis. No problems were identified here. Follow up films showed some residual stenosis. I therefore went back with a 6 mm x 6 cm Mustang balloon which was inflated to 24 atm for 2 minutes. At this point there was a 10% residual stenosis. I felt that going larger than this could potentially result in perforation so I stopped at this point. There was improvement in the thrill.  The cannulation site was closed with a 4-0 Monocryl suture.  Pressure was held for hemostasis. No immediate competitions were noted.  FINDINGS:  1. The anastomosis is widely patent. 2. The right brachiocephalic fistula is widely patent except for an area of stenosis over a length of approximately 4 cm which was 70% narrowed. This was successfully addressed with balloon angioplasty aas described above. 3. There was no central venous stenosis.  PRE: 70% POST: 10% STENT: no  Deitra Mayo, MD, FACS [facility]  DATE OF DICTATION:   09/07/2015

## 2015-09-07 NOTE — Discharge Instructions (Signed)
Fistulogram, Care After °Refer to this sheet in the next few weeks. These instructions provide you with information on caring for yourself after your procedure. Your health care provider may also give you more specific instructions. Your treatment has been planned according to current medical practices, but problems sometimes occur. Call your health care provider if you have any problems or questions after your procedure. °WHAT TO EXPECT AFTER THE PROCEDURE °After your procedure, it is typical to have the following: °· A small amount of discomfort in the area where the catheters were placed. °· A small amount of bruising around the fistula. °· Sleepiness and fatigue. °HOME CARE INSTRUCTIONS °· Rest at home for the day following your procedure. °· Do not drive or operate heavy machinery while taking pain medicine. °· Take medicines only as directed by your health care provider. °· Do not take baths, swim, or use a hot tub until your health care provider approves. You may shower 24 hours after the procedure or as directed by your health care provider. °· There are many different ways to close and cover an incision, including stitches, skin glue, and adhesive strips. Follow your health care provider's instructions on: °¨ Incision care. °¨ Bandage (dressing) changes and removal. °¨ Incision closure removal. °· Monitor your dialysis fistula carefully. °SEEK MEDICAL CARE IF: °· You have drainage, redness, swelling, or pain at your catheter site. °· You have a fever. °· You have chills. °SEEK IMMEDIATE MEDICAL CARE IF: °· You feel weak. °· You have trouble balancing. °· You have trouble moving your arms or legs. °· You have problems with your speech or vision. °· You can no longer feel a vibration or buzz when you put your fingers over your dialysis fistula. °· The limb that was used for the procedure: °¨ Swells. °¨ Is painful. °¨ Is cold. °¨ Is discolored, such as blue or pale white. °  °This information is not intended  to replace advice given to you by your health care provider. Make sure you discuss any questions you have with your health care provider. °  °Document Released: 07/01/2013 Document Reviewed: 07/01/2013 °Elsevier Interactive Patient Education ©2016 Elsevier Inc. ° °

## 2015-09-07 NOTE — Interval H&P Note (Signed)
History and Physical Interval Note:  09/07/2015 9:41 AM  Jesus [name]  has presented today for surgery, with the diagnosis of end stage renal  The various methods of treatment have been discussed with the patient and family. After consideration of risks, benefits and other options for treatment, the patient has consented to  Procedure(s): Fistulagram (N/A) as a surgical intervention .  The patient's history has been reviewed, patient examined, no change in status, stable for surgery.  I have reviewed the patient's chart and labs.  Questions were answered to the patient's satisfaction.     Deitra Mayo

## 2015-09-08 DIAGNOSIS — D509 Iron deficiency anemia, unspecified: Secondary | ICD-10-CM | POA: Diagnosis not present

## 2015-09-08 DIAGNOSIS — D631 Anemia in chronic kidney disease: Secondary | ICD-10-CM | POA: Diagnosis not present

## 2015-09-08 DIAGNOSIS — N2581 Secondary hyperparathyroidism of renal origin: Secondary | ICD-10-CM | POA: Diagnosis not present

## 2015-09-08 DIAGNOSIS — N186 End stage renal disease: Secondary | ICD-10-CM | POA: Diagnosis not present

## 2015-09-08 DIAGNOSIS — Z992 Dependence on renal dialysis: Secondary | ICD-10-CM | POA: Diagnosis not present

## 2015-09-10 DIAGNOSIS — Z992 Dependence on renal dialysis: Secondary | ICD-10-CM | POA: Diagnosis not present

## 2015-09-10 DIAGNOSIS — N2581 Secondary hyperparathyroidism of renal origin: Secondary | ICD-10-CM | POA: Diagnosis not present

## 2015-09-10 DIAGNOSIS — D509 Iron deficiency anemia, unspecified: Secondary | ICD-10-CM | POA: Diagnosis not present

## 2015-09-10 DIAGNOSIS — N186 End stage renal disease: Secondary | ICD-10-CM | POA: Diagnosis not present

## 2015-09-10 DIAGNOSIS — D631 Anemia in chronic kidney disease: Secondary | ICD-10-CM | POA: Diagnosis not present

## 2015-09-12 DIAGNOSIS — Z992 Dependence on renal dialysis: Secondary | ICD-10-CM | POA: Diagnosis not present

## 2015-09-12 DIAGNOSIS — D631 Anemia in chronic kidney disease: Secondary | ICD-10-CM | POA: Diagnosis not present

## 2015-09-12 DIAGNOSIS — D509 Iron deficiency anemia, unspecified: Secondary | ICD-10-CM | POA: Diagnosis not present

## 2015-09-12 DIAGNOSIS — N186 End stage renal disease: Secondary | ICD-10-CM | POA: Diagnosis not present

## 2015-09-12 DIAGNOSIS — N2581 Secondary hyperparathyroidism of renal origin: Secondary | ICD-10-CM | POA: Diagnosis not present

## 2015-09-15 DIAGNOSIS — N186 End stage renal disease: Secondary | ICD-10-CM | POA: Diagnosis not present

## 2015-09-15 DIAGNOSIS — D509 Iron deficiency anemia, unspecified: Secondary | ICD-10-CM | POA: Diagnosis not present

## 2015-09-15 DIAGNOSIS — Z992 Dependence on renal dialysis: Secondary | ICD-10-CM | POA: Diagnosis not present

## 2015-09-15 DIAGNOSIS — N2581 Secondary hyperparathyroidism of renal origin: Secondary | ICD-10-CM | POA: Diagnosis not present

## 2015-09-15 DIAGNOSIS — D631 Anemia in chronic kidney disease: Secondary | ICD-10-CM | POA: Diagnosis not present

## 2015-09-17 DIAGNOSIS — D631 Anemia in chronic kidney disease: Secondary | ICD-10-CM | POA: Diagnosis not present

## 2015-09-17 DIAGNOSIS — N2581 Secondary hyperparathyroidism of renal origin: Secondary | ICD-10-CM | POA: Diagnosis not present

## 2015-09-17 DIAGNOSIS — D509 Iron deficiency anemia, unspecified: Secondary | ICD-10-CM | POA: Diagnosis not present

## 2015-09-17 DIAGNOSIS — Z992 Dependence on renal dialysis: Secondary | ICD-10-CM | POA: Diagnosis not present

## 2015-09-17 DIAGNOSIS — N186 End stage renal disease: Secondary | ICD-10-CM | POA: Diagnosis not present

## 2015-09-18 DIAGNOSIS — Z6836 Body mass index (BMI) 36.0-36.9, adult: Secondary | ICD-10-CM | POA: Diagnosis not present

## 2015-09-18 DIAGNOSIS — I10 Essential (primary) hypertension: Secondary | ICD-10-CM | POA: Diagnosis not present

## 2015-09-18 DIAGNOSIS — Z992 Dependence on renal dialysis: Secondary | ICD-10-CM | POA: Diagnosis not present

## 2015-09-18 DIAGNOSIS — Z905 Acquired absence of kidney: Secondary | ICD-10-CM | POA: Diagnosis not present

## 2015-09-19 DIAGNOSIS — N186 End stage renal disease: Secondary | ICD-10-CM | POA: Diagnosis not present

## 2015-09-19 DIAGNOSIS — D509 Iron deficiency anemia, unspecified: Secondary | ICD-10-CM | POA: Diagnosis not present

## 2015-09-19 DIAGNOSIS — D631 Anemia in chronic kidney disease: Secondary | ICD-10-CM | POA: Diagnosis not present

## 2015-09-19 DIAGNOSIS — Z992 Dependence on renal dialysis: Secondary | ICD-10-CM | POA: Diagnosis not present

## 2015-09-19 DIAGNOSIS — N2581 Secondary hyperparathyroidism of renal origin: Secondary | ICD-10-CM | POA: Diagnosis not present

## 2015-09-22 DIAGNOSIS — D631 Anemia in chronic kidney disease: Secondary | ICD-10-CM | POA: Diagnosis not present

## 2015-09-22 DIAGNOSIS — N2581 Secondary hyperparathyroidism of renal origin: Secondary | ICD-10-CM | POA: Diagnosis not present

## 2015-09-22 DIAGNOSIS — Z992 Dependence on renal dialysis: Secondary | ICD-10-CM | POA: Diagnosis not present

## 2015-09-22 DIAGNOSIS — N186 End stage renal disease: Secondary | ICD-10-CM | POA: Diagnosis not present

## 2015-09-22 DIAGNOSIS — D509 Iron deficiency anemia, unspecified: Secondary | ICD-10-CM | POA: Diagnosis not present

## 2015-09-24 DIAGNOSIS — N2581 Secondary hyperparathyroidism of renal origin: Secondary | ICD-10-CM | POA: Diagnosis not present

## 2015-09-24 DIAGNOSIS — D631 Anemia in chronic kidney disease: Secondary | ICD-10-CM | POA: Diagnosis not present

## 2015-09-24 DIAGNOSIS — Z992 Dependence on renal dialysis: Secondary | ICD-10-CM | POA: Diagnosis not present

## 2015-09-24 DIAGNOSIS — N186 End stage renal disease: Secondary | ICD-10-CM | POA: Diagnosis not present

## 2015-09-24 DIAGNOSIS — D509 Iron deficiency anemia, unspecified: Secondary | ICD-10-CM | POA: Diagnosis not present

## 2015-09-26 DIAGNOSIS — D509 Iron deficiency anemia, unspecified: Secondary | ICD-10-CM | POA: Diagnosis not present

## 2015-09-26 DIAGNOSIS — N186 End stage renal disease: Secondary | ICD-10-CM | POA: Diagnosis not present

## 2015-09-26 DIAGNOSIS — Z992 Dependence on renal dialysis: Secondary | ICD-10-CM | POA: Diagnosis not present

## 2015-09-26 DIAGNOSIS — N2581 Secondary hyperparathyroidism of renal origin: Secondary | ICD-10-CM | POA: Diagnosis not present

## 2015-09-26 DIAGNOSIS — D631 Anemia in chronic kidney disease: Secondary | ICD-10-CM | POA: Diagnosis not present

## 2015-09-28 DIAGNOSIS — Z992 Dependence on renal dialysis: Secondary | ICD-10-CM | POA: Diagnosis not present

## 2015-09-28 DIAGNOSIS — N186 End stage renal disease: Secondary | ICD-10-CM | POA: Diagnosis not present

## 2015-09-29 ENCOUNTER — Telehealth: Payer: Self-pay | Admitting: Vascular Surgery

## 2015-09-29 DIAGNOSIS — N186 End stage renal disease: Secondary | ICD-10-CM | POA: Diagnosis not present

## 2015-09-29 DIAGNOSIS — Z992 Dependence on renal dialysis: Secondary | ICD-10-CM | POA: Diagnosis not present

## 2015-09-29 DIAGNOSIS — D509 Iron deficiency anemia, unspecified: Secondary | ICD-10-CM | POA: Diagnosis not present

## 2015-09-29 DIAGNOSIS — D631 Anemia in chronic kidney disease: Secondary | ICD-10-CM | POA: Diagnosis not present

## 2015-09-29 DIAGNOSIS — N2581 Secondary hyperparathyroidism of renal origin: Secondary | ICD-10-CM | POA: Diagnosis not present

## 2015-09-29 NOTE — Telephone Encounter (Signed)
Sched lab for 8/21 at 3:00 and MD 8/23 at 12:45. Lm on hm# to inform pt of appt.

## 2015-09-29 NOTE — Telephone Encounter (Signed)
-----   Message from Donzetta Matters sent at 09/23/2015 10:18 AM EDT ----- Regarding: FW: Is Post Op Follow Up needed?   ----- Message ----- From: Mena Goes, RN Sent: 09/23/2015  10:06 AM To: Janalyn Harder McKenzie Subject: RE: Is Post Op Follow Up needed?               PROCEDURE:   1. Ultrasound-guided access of right brachiocephalic AV fistula  2. Fistulogram right brachiocephalic AV fistula  3. Venoplasty right brachiocephalic AV fistula  4. Conscious sedation   SURGEON: Judeth Cornfield. Scot Dock, MD, FACS   E will need a follow up visit in4 weeks with a duplex of his fistula. Thank you.  CD   This message was sent to the adm.pool last week.   ----- Message ----- From: Donzetta Matters Sent: 09/23/2015   9:04 AM To: [facility] Pool Subject: Is Post Op Follow Up needed?                   Good Morning,  I received a message from this patient regarding a post op appointment. He had surgery with [facility] and was told to follow up within 4 weeks for an appointment. I looked in his chart for notes and I didn't see it. I just want to make sure if a follow up is needed.    Thank you

## 2015-10-01 DIAGNOSIS — N186 End stage renal disease: Secondary | ICD-10-CM | POA: Diagnosis not present

## 2015-10-01 DIAGNOSIS — D509 Iron deficiency anemia, unspecified: Secondary | ICD-10-CM | POA: Diagnosis not present

## 2015-10-01 DIAGNOSIS — Z992 Dependence on renal dialysis: Secondary | ICD-10-CM | POA: Diagnosis not present

## 2015-10-01 DIAGNOSIS — N2581 Secondary hyperparathyroidism of renal origin: Secondary | ICD-10-CM | POA: Diagnosis not present

## 2015-10-01 DIAGNOSIS — D631 Anemia in chronic kidney disease: Secondary | ICD-10-CM | POA: Diagnosis not present

## 2015-10-03 DIAGNOSIS — D509 Iron deficiency anemia, unspecified: Secondary | ICD-10-CM | POA: Diagnosis not present

## 2015-10-03 DIAGNOSIS — Z992 Dependence on renal dialysis: Secondary | ICD-10-CM | POA: Diagnosis not present

## 2015-10-03 DIAGNOSIS — D631 Anemia in chronic kidney disease: Secondary | ICD-10-CM | POA: Diagnosis not present

## 2015-10-03 DIAGNOSIS — N2581 Secondary hyperparathyroidism of renal origin: Secondary | ICD-10-CM | POA: Diagnosis not present

## 2015-10-03 DIAGNOSIS — N186 End stage renal disease: Secondary | ICD-10-CM | POA: Diagnosis not present

## 2015-10-05 DIAGNOSIS — J90 Pleural effusion, not elsewhere classified: Secondary | ICD-10-CM | POA: Diagnosis not present

## 2015-10-05 DIAGNOSIS — C651 Malignant neoplasm of right renal pelvis: Secondary | ICD-10-CM | POA: Diagnosis not present

## 2015-10-05 DIAGNOSIS — C679 Malignant neoplasm of bladder, unspecified: Secondary | ICD-10-CM | POA: Diagnosis not present

## 2015-10-05 DIAGNOSIS — K7689 Other specified diseases of liver: Secondary | ICD-10-CM | POA: Diagnosis not present

## 2015-10-05 DIAGNOSIS — C689 Malignant neoplasm of urinary organ, unspecified: Secondary | ICD-10-CM | POA: Diagnosis not present

## 2015-10-05 DIAGNOSIS — C661 Malignant neoplasm of right ureter: Secondary | ICD-10-CM | POA: Diagnosis not present

## 2015-10-06 DIAGNOSIS — N186 End stage renal disease: Secondary | ICD-10-CM | POA: Diagnosis not present

## 2015-10-06 DIAGNOSIS — D631 Anemia in chronic kidney disease: Secondary | ICD-10-CM | POA: Diagnosis not present

## 2015-10-06 DIAGNOSIS — D509 Iron deficiency anemia, unspecified: Secondary | ICD-10-CM | POA: Diagnosis not present

## 2015-10-06 DIAGNOSIS — N2581 Secondary hyperparathyroidism of renal origin: Secondary | ICD-10-CM | POA: Diagnosis not present

## 2015-10-06 DIAGNOSIS — Z992 Dependence on renal dialysis: Secondary | ICD-10-CM | POA: Diagnosis not present

## 2015-10-07 DIAGNOSIS — C651 Malignant neoplasm of right renal pelvis: Secondary | ICD-10-CM | POA: Diagnosis not present

## 2015-10-07 DIAGNOSIS — J90 Pleural effusion, not elsewhere classified: Secondary | ICD-10-CM | POA: Diagnosis not present

## 2015-10-07 DIAGNOSIS — C661 Malignant neoplasm of right ureter: Secondary | ICD-10-CM | POA: Diagnosis not present

## 2015-10-08 DIAGNOSIS — Z992 Dependence on renal dialysis: Secondary | ICD-10-CM | POA: Diagnosis not present

## 2015-10-08 DIAGNOSIS — N186 End stage renal disease: Secondary | ICD-10-CM | POA: Diagnosis not present

## 2015-10-08 DIAGNOSIS — D509 Iron deficiency anemia, unspecified: Secondary | ICD-10-CM | POA: Diagnosis not present

## 2015-10-08 DIAGNOSIS — D631 Anemia in chronic kidney disease: Secondary | ICD-10-CM | POA: Diagnosis not present

## 2015-10-08 DIAGNOSIS — N2581 Secondary hyperparathyroidism of renal origin: Secondary | ICD-10-CM | POA: Diagnosis not present

## 2015-10-10 DIAGNOSIS — D631 Anemia in chronic kidney disease: Secondary | ICD-10-CM | POA: Diagnosis not present

## 2015-10-10 DIAGNOSIS — N186 End stage renal disease: Secondary | ICD-10-CM | POA: Diagnosis not present

## 2015-10-10 DIAGNOSIS — D509 Iron deficiency anemia, unspecified: Secondary | ICD-10-CM | POA: Diagnosis not present

## 2015-10-10 DIAGNOSIS — Z992 Dependence on renal dialysis: Secondary | ICD-10-CM | POA: Diagnosis not present

## 2015-10-10 DIAGNOSIS — N2581 Secondary hyperparathyroidism of renal origin: Secondary | ICD-10-CM | POA: Diagnosis not present

## 2015-10-13 DIAGNOSIS — D631 Anemia in chronic kidney disease: Secondary | ICD-10-CM | POA: Diagnosis not present

## 2015-10-13 DIAGNOSIS — D509 Iron deficiency anemia, unspecified: Secondary | ICD-10-CM | POA: Diagnosis not present

## 2015-10-13 DIAGNOSIS — N186 End stage renal disease: Secondary | ICD-10-CM | POA: Diagnosis not present

## 2015-10-13 DIAGNOSIS — Z992 Dependence on renal dialysis: Secondary | ICD-10-CM | POA: Diagnosis not present

## 2015-10-13 DIAGNOSIS — N2581 Secondary hyperparathyroidism of renal origin: Secondary | ICD-10-CM | POA: Diagnosis not present

## 2015-10-15 DIAGNOSIS — N186 End stage renal disease: Secondary | ICD-10-CM | POA: Diagnosis not present

## 2015-10-15 DIAGNOSIS — D509 Iron deficiency anemia, unspecified: Secondary | ICD-10-CM | POA: Diagnosis not present

## 2015-10-15 DIAGNOSIS — D631 Anemia in chronic kidney disease: Secondary | ICD-10-CM | POA: Diagnosis not present

## 2015-10-15 DIAGNOSIS — Z992 Dependence on renal dialysis: Secondary | ICD-10-CM | POA: Diagnosis not present

## 2015-10-15 DIAGNOSIS — N2581 Secondary hyperparathyroidism of renal origin: Secondary | ICD-10-CM | POA: Diagnosis not present

## 2015-10-16 ENCOUNTER — Other Ambulatory Visit: Payer: Self-pay | Admitting: *Deleted

## 2015-10-16 DIAGNOSIS — Z4931 Encounter for adequacy testing for hemodialysis: Secondary | ICD-10-CM

## 2015-10-16 DIAGNOSIS — N186 End stage renal disease: Secondary | ICD-10-CM

## 2015-10-18 DIAGNOSIS — Z992 Dependence on renal dialysis: Secondary | ICD-10-CM | POA: Diagnosis not present

## 2015-10-18 DIAGNOSIS — D631 Anemia in chronic kidney disease: Secondary | ICD-10-CM | POA: Diagnosis not present

## 2015-10-18 DIAGNOSIS — N186 End stage renal disease: Secondary | ICD-10-CM | POA: Diagnosis not present

## 2015-10-18 DIAGNOSIS — D509 Iron deficiency anemia, unspecified: Secondary | ICD-10-CM | POA: Diagnosis not present

## 2015-10-18 DIAGNOSIS — N2581 Secondary hyperparathyroidism of renal origin: Secondary | ICD-10-CM | POA: Diagnosis not present

## 2015-10-19 ENCOUNTER — Ambulatory Visit (HOSPITAL_COMMUNITY)
Admission: RE | Admit: 2015-10-19 | Discharge: 2015-10-19 | Disposition: A | Discharge status: Home / Self Care | Payer: Medicare Other | Source: Ambulatory Visit | Attending: Surgery | Admitting: Surgery

## 2015-10-19 ENCOUNTER — Encounter: Payer: Self-pay | Admitting: Vascular Surgery

## 2015-10-19 DIAGNOSIS — I120 Hypertensive chronic kidney disease with stage 5 chronic kidney disease or end stage renal disease: Secondary | ICD-10-CM | POA: Diagnosis not present

## 2015-10-19 DIAGNOSIS — F419 Anxiety disorder, unspecified: Secondary | ICD-10-CM | POA: Diagnosis not present

## 2015-10-19 DIAGNOSIS — Z4931 Encounter for adequacy testing for hemodialysis: Secondary | ICD-10-CM | POA: Diagnosis not present

## 2015-10-19 DIAGNOSIS — N186 End stage renal disease: Secondary | ICD-10-CM | POA: Diagnosis not present

## 2015-10-20 DIAGNOSIS — N2581 Secondary hyperparathyroidism of renal origin: Secondary | ICD-10-CM | POA: Diagnosis not present

## 2015-10-20 DIAGNOSIS — Z992 Dependence on renal dialysis: Secondary | ICD-10-CM | POA: Diagnosis not present

## 2015-10-20 DIAGNOSIS — D509 Iron deficiency anemia, unspecified: Secondary | ICD-10-CM | POA: Diagnosis not present

## 2015-10-20 DIAGNOSIS — D631 Anemia in chronic kidney disease: Secondary | ICD-10-CM | POA: Diagnosis not present

## 2015-10-20 DIAGNOSIS — N186 End stage renal disease: Secondary | ICD-10-CM | POA: Diagnosis not present

## 2015-10-21 ENCOUNTER — Other Ambulatory Visit: Payer: Self-pay

## 2015-10-21 ENCOUNTER — Ambulatory Visit (INDEPENDENT_AMBULATORY_CARE_PROVIDER_SITE_OTHER): Payer: Medicare Other | Admitting: Vascular Surgery

## 2015-10-21 ENCOUNTER — Encounter: Payer: Self-pay | Admitting: Vascular Surgery

## 2015-10-21 VITALS — BP 163/86 | HR 53 | Temp 98.7°F | Resp 16 | Ht 70.0 in | Wt 248.0 lb

## 2015-10-21 DIAGNOSIS — Z992 Dependence on renal dialysis: Secondary | ICD-10-CM

## 2015-10-21 DIAGNOSIS — N186 End stage renal disease: Secondary | ICD-10-CM | POA: Diagnosis not present

## 2015-10-21 NOTE — Progress Notes (Signed)
Patient name: Jesus [name] MRN: [id number] DOB: 1951-02-15 Sex: male  REASON FOR VISIT: Follow up of AV fistula  HPI: Jesus [name] is a 65 y.o. male who had a right brachiocephalic fistula placed on 06/05/15.  The patient dialyzes with a catheter on Tuesdays Thursdays and Saturdays. The fistula was not maturing adequately and on 09/07/2015, the patient underwent a fistulogram and venoplasty of a stenosis within the brachiocephalic fistula. There was an area of stenosis over a proximally 4 cm which was successfully addressed with balloon angioplasty. There was also one competing branches divided adjacent to the area of stenosis. There was no central venous stenosis.  He has no specific complaints referrable to his right upper extremity.  Current Outpatient Prescriptions  Medication Sig Dispense Refill  . ALPRAZolam (XANAX) 1 MG tablet Take 1 mg by mouth 3 (three) times daily as needed for anxiety.     . metoprolol tartrate (LOPRESSOR) 25 MG tablet Take 75 mg by mouth 2 (two) times daily.     Marland Kitchen oxyCODONE-acetaminophen (ROXICET) 5-325 MG tablet Take 1-2 tablets by mouth every 4 (four) hours as needed. (Patient taking differently: Take 1-2 tablets by mouth every 4 (four) hours as needed for moderate pain. ) 30 tablet 0  . Polyethyl Glycol-Propyl Glycol (SYSTANE OP) Place 1 drop into both eyes daily as needed (for dry, itchy eyes).    . sevelamer carbonate (RENVELA) 800 MG tablet Take 1,600 mg by mouth 3 (three) times daily with meals.     No current facility-administered medications for this visit.     REVIEW OF SYSTEMS:  [X]  denotes positive finding, [ ]  denotes negative finding Cardiac  Comments:  Chest pain or chest pressure:    Shortness of breath upon exertion:    Short of breath when lying flat:    Irregular heart rhythm:    Constitutional    Fever or chills:      PHYSICAL EXAM: Vitals:   10/21/15 1305 10/21/15 1307  BP: (!) 159/86 (!) 163/86  Pulse: (!) 53   Resp: 16   Temp:  98.7 F (37.1 C)   TempSrc: Oral   SpO2: 100%   Weight: 248 lb (112.5 kg)   Height: 5\' 10"  (1.778 m)     GENERAL: The patient is a well-nourished male, in no acute distress. The vital signs are documented above. CARDIOVASCULAR: There is a regular rate and rhythm. PULMONARY: There is good air exchange bilaterally without wheezing or rales. The proximal fistula has an excellent thrill. It then becomes somewhat pulsatile and smaller in the mid upper arm. He has a palpable right radial pulse.  I have reviewed his fistulogram which shows the area of stenosis which appeared to have a reasonable result with venoplasty. There was an adjacent competing branch. The arterial anastomosis looked fine and there were no other real problems at the 5. There was no central venous stenosis.  DUPLEX OF DIALYSIS FISTULA: I have independently interpreted the duplex of the dialysis fistula which shows that the diameters of the fistula range from 0.33-0.6 cm. There is an area of increased velocities in the mid upper arm.  MEDICAL ISSUES:  STATUS POST RIGHT BRACHIOCEPHALIC AV FISTULA: He appears to develop some recurrent stenosis in the mid upper arm. He also has a competing branch in this area. I have recommended patch angioplasty of this area of stenosis and ligation of the competing branch. The fistula otherwise looks to be satisfactory so I hate to give up on this fistula.  However, if this is not successful that he would require either a basilic vein transposition on the right or an AV graft. His surgery is scheduled for 10/23/2015. This is a nondialysis day.  HYPERTENSION: The patient's initial blood pressure today was elevated. We repeated this and this was still elevated. We have encouraged the patient to follow up with their primary care physician for management of their blood pressure.   Deitra Mayo [facility] [place] [phone]

## 2015-10-22 ENCOUNTER — Encounter (HOSPITAL_COMMUNITY): Payer: Self-pay | Admitting: *Deleted

## 2015-10-22 DIAGNOSIS — N186 End stage renal disease: Secondary | ICD-10-CM | POA: Diagnosis not present

## 2015-10-22 DIAGNOSIS — D509 Iron deficiency anemia, unspecified: Secondary | ICD-10-CM | POA: Diagnosis not present

## 2015-10-22 DIAGNOSIS — Z992 Dependence on renal dialysis: Secondary | ICD-10-CM | POA: Diagnosis not present

## 2015-10-22 DIAGNOSIS — N2581 Secondary hyperparathyroidism of renal origin: Secondary | ICD-10-CM | POA: Diagnosis not present

## 2015-10-22 DIAGNOSIS — D631 Anemia in chronic kidney disease: Secondary | ICD-10-CM | POA: Diagnosis not present

## 2015-10-22 NOTE — Progress Notes (Signed)
Colletta Maryland, Surgical Scheduler, aware that pt has to reschedule surgery.

## 2015-10-24 DIAGNOSIS — D631 Anemia in chronic kidney disease: Secondary | ICD-10-CM | POA: Diagnosis not present

## 2015-10-24 DIAGNOSIS — D509 Iron deficiency anemia, unspecified: Secondary | ICD-10-CM | POA: Diagnosis not present

## 2015-10-24 DIAGNOSIS — N2581 Secondary hyperparathyroidism of renal origin: Secondary | ICD-10-CM | POA: Diagnosis not present

## 2015-10-24 DIAGNOSIS — Z992 Dependence on renal dialysis: Secondary | ICD-10-CM | POA: Diagnosis not present

## 2015-10-24 DIAGNOSIS — N186 End stage renal disease: Secondary | ICD-10-CM | POA: Diagnosis not present

## 2015-10-27 DIAGNOSIS — N2581 Secondary hyperparathyroidism of renal origin: Secondary | ICD-10-CM | POA: Diagnosis not present

## 2015-10-27 DIAGNOSIS — D631 Anemia in chronic kidney disease: Secondary | ICD-10-CM | POA: Diagnosis not present

## 2015-10-27 DIAGNOSIS — N186 End stage renal disease: Secondary | ICD-10-CM | POA: Diagnosis not present

## 2015-10-27 DIAGNOSIS — D509 Iron deficiency anemia, unspecified: Secondary | ICD-10-CM | POA: Diagnosis not present

## 2015-10-27 DIAGNOSIS — Z992 Dependence on renal dialysis: Secondary | ICD-10-CM | POA: Diagnosis not present

## 2015-10-28 ENCOUNTER — Encounter (HOSPITAL_COMMUNITY): Payer: Self-pay | Admitting: Vascular Surgery

## 2015-10-28 NOTE — Progress Notes (Addendum)
Anesthesia Chart Review: [name].  Pt is a 65 year old [name] scheduled for patch angioplasty right brachiocephalic AVFand ligation of competing branches on 10/30/15 by Dr. Scot Dock.   PMH includes never smoker, HTN, anemia, exertional dyspnea, anxiety, obesity, cervical fusion, right TKA '13, stage 3 urothelial carcinoma s/p right nephrectomy, ureterectomy and retroperitoneal LN dissection 09/12/12 s/p chemotherapy with ablation of multiple ureteral tumors (05/2014, 08/2014, 11/2014) with completion GU-ectomy (robot-assisted radical left nephroureterectomy and cysto-prostatectomy) 04/06/15 with resultant need for hemodialysis (TTS). S/P creation of right brachiocephalic AVF [id number] s/p venoplasty 09/07/15. He underwent right thoracentesis on Jesus/9/17 [facility]) with removal of 1400 mL of serous fluid (no malignancy identified by cytology report).   PCP is Dr. Celedonio Savage in [place].  Nephrologist is Dr. Lowanda Foster.  HEM-ONC is Dr. Dorthula Matas [facility]), last visit Jesus/7/17. She recommended thoracentesis for symptomatic right pleural effusion (done Jesus/9/17). She is monitoring his anemia and would consider repeat transfusion if HGB < Jesus.5. (H/H 11/32.4 at that time.)  Medications include Xanax, amlodipine, Toprol XL, Renvela, Roxicet.  09/07/15 EKG: SB at 49 bpm, LAD, consider septal infarct (age undetermined). LAD is new when compared to 02/19/13 tracing. HR was 51 bpm at that time. 04/2015 EKGs at [facility] showed heart rates 48-50 bpm.   Jesus/7/17 CT Chest/Abd/Pelvis [facility]; Care Everywhere): Result Impression: 1. Status post cystoprostatectomy and bilateral nephroureterectomy in this patient with multifocal upper and lower tract urothelial carcinomas. Unchanged subcentimeter retroperitoneal and iliac chain lymph nodes. Continued attention on follow-up. 2. Increasing right pleural effusion, now moderate in size. Similar small left pleural effusion. There may be some degree of irregular pleural thickening in the right lung  base. Although the increased effusion could be due to volume overload, the presence of pleural thickening would make it difficult to entirely exclude tumor. 3. Slight increased size of mediastinal lymph nodes. The largest mid paratracheal node measures 2.5 x 1.7 cm. Continued attention on follow-up. 4. Interval development of small volume ascites, etiology unknown but possibly related to volume overload. 5. Numerous additional findings as described above (see report). (He underwent right thoracentesis on Jesus/9/17.)  He will get an ISTAT4 on arrival. If any new or progressive SOB then would need to also consider a repeat CXR. Will defer to anesthesiologist and/or surgeon following their evaluations. He is currently receiving hemodialysis via a tunneled dialysis catheter, so hopefully that is helping managing his volume status. He has known bradycardia as shown on multiple EKGs since at least 01/2013 (HR high 40's-low 50's). He will get vitals on arrival and evaluation to ensure he is not symptomatic.  [name] Short Stay Center/Anesthesiology Phone [phone] Jesus/30/2017 9:37 AM  Addendum: PAT RN Jan called and spoke with patient today. He reported more SOB, but not yet to the extent that he had when he required thoracentesis. It sounds like he has been having some paroxysmal nocturnal dyspnea. He was advised to talk with nephrology when he goes to hemodialysis today or present to the ED if worsening symptoms. If he presents tomorrow, we will need to get a CXR to re-evaluate. If his symptoms don't improve today with dialysis and/or CXR looks worse, then surgery may be canceled, and he may get referred for repeat thoracentesis. I called and spoke with [facility] RN Arbie Cookey about this. Apparently a PA at the dialysis center had already called to ask if patient could get a CXR tomorrow. Definitive plan following evaluation of patient, labs, and chest imaging tomorrow.  Myra Gianotti, PA-C [facility]  Short Stay Center/Anesthesiology Phone [phone] Jesus/31/2017 2:22 PM

## 2015-10-29 ENCOUNTER — Encounter (HOSPITAL_COMMUNITY): Payer: Self-pay | Admitting: *Deleted

## 2015-10-29 ENCOUNTER — Other Ambulatory Visit: Payer: Self-pay

## 2015-10-29 DIAGNOSIS — Z992 Dependence on renal dialysis: Secondary | ICD-10-CM | POA: Diagnosis not present

## 2015-10-29 DIAGNOSIS — D509 Iron deficiency anemia, unspecified: Secondary | ICD-10-CM | POA: Diagnosis not present

## 2015-10-29 DIAGNOSIS — D631 Anemia in chronic kidney disease: Secondary | ICD-10-CM | POA: Diagnosis not present

## 2015-10-29 DIAGNOSIS — N2581 Secondary hyperparathyroidism of renal origin: Secondary | ICD-10-CM | POA: Diagnosis not present

## 2015-10-29 DIAGNOSIS — N186 End stage renal disease: Secondary | ICD-10-CM | POA: Diagnosis not present

## 2015-10-29 MED ORDER — DEXTROSE 5 % IV SOLN
1.5000 g | INTRAVENOUS | Status: AC
  Administered 2015-10-30: 1.5 g via INTRAVENOUS
  Filled 2015-10-29: qty 1.5

## 2015-10-29 NOTE — Anesthesia Preprocedure Evaluation (Addendum)
Anesthesia Evaluation  Patient identified by MRN, date of birth, ID band Patient awake    Reviewed: Allergy & Precautions, NPO status , Patient's Chart, lab work & pertinent test results, reviewed documented beta blocker date and time   Airway Mallampati: II  TM Distance: >3 FB Neck ROM: Full    Dental  (+) Teeth Intact, Dental Advisory Given   Pulmonary    breath sounds clear to auscultation       Cardiovascular hypertension, Pt. on home beta blockers and Pt. on medications  Rhythm:Regular Rate:Normal     Neuro/Psych    GI/Hepatic   Endo/Other  Morbid obesity  Renal/GU DialysisRenal disease     Musculoskeletal   Abdominal   Peds  Hematology  (+) anemia ,   Anesthesia Other Findings   Reproductive/Obstetrics                          Anesthesia Physical Anesthesia Plan  ASA: III  Anesthesia Plan: MAC   Post-op Pain Management:    Induction: Intravenous  Airway Management Planned: Simple Face Mask  Additional Equipment:   Intra-op Plan:   Post-operative Plan: Extubation in OR  Informed Consent: I have reviewed the patients History and Physical, chart, labs and discussed the procedure including the risks, benefits and alternatives for the proposed anesthesia with the patient or authorized representative who has indicated his/her understanding and acceptance.   Dental advisory given and Dental Advisory Given  Plan Discussed with: CRNA, Anesthesiologist and Surgeon  Anesthesia Plan Comments: (See my anesthesia note. Follow-up day of surgery CXR. Myra Gianotti, PA-C)      Anesthesia Quick Evaluation

## 2015-10-29 NOTE — Progress Notes (Signed)
   10/29/15 1105  OBSTRUCTIVE SLEEP APNEA  Have you ever been diagnosed with sleep apnea through a sleep study? No  Do you snore loudly (loud enough to be heard through closed doors)?  0  Do you often feel tired, fatigued, or sleepy during the daytime (such as falling asleep during driving or talking to someone)? 0  Has anyone observed you stop breathing during your sleep? 0  Do you have, or are you being treated for high blood pressure? 1  BMI more than 35 kg/m2? 1  Age > 50 (1-yes) 1  Neck circumference greater than:Male 16 inches or larger, Male 17inches or larger? 1  Male Gender (Yes=1) 1  Obstructive Sleep Apnea Score 5  Score 5 or greater  Results sent to PCP

## 2015-10-29 NOTE — Progress Notes (Signed)
Jesus [name] has a history of Pulmonary Effusion.  Patient had a thoracentesis 10/07/15 at [facility], 1400 ml was removed. Jesus [name] reports that he is experiencing shortness of breath again, "not as bad as last time, last time I couldn't lie down, this time I wake up a lot short of breath."  Jesus [name] has hemodialysis this afternoon, I encourage him to speak with  His nephrologist or go to the emergency.

## 2015-10-30 ENCOUNTER — Ambulatory Visit (HOSPITAL_COMMUNITY): Payer: Medicare Other | Admitting: Vascular Surgery

## 2015-10-30 ENCOUNTER — Ambulatory Visit (HOSPITAL_COMMUNITY): Payer: Medicare Other

## 2015-10-30 ENCOUNTER — Encounter (HOSPITAL_COMMUNITY): Payer: Self-pay | Admitting: *Deleted

## 2015-10-30 ENCOUNTER — Ambulatory Visit (HOSPITAL_COMMUNITY)
Admission: RE | Admit: 2015-10-30 | Discharge: 2015-10-30 | Disposition: A | Discharge status: Home / Self Care | Payer: Medicare Other | Source: Ambulatory Visit | Attending: Vascular Surgery | Admitting: Vascular Surgery

## 2015-10-30 ENCOUNTER — Encounter (HOSPITAL_COMMUNITY)
Admission: RE | Disposition: A | Discharge status: Home / Self Care | Payer: Self-pay | Source: Ambulatory Visit | Attending: Vascular Surgery

## 2015-10-30 ENCOUNTER — Telehealth: Payer: Self-pay | Admitting: Vascular Surgery

## 2015-10-30 DIAGNOSIS — N186 End stage renal disease: Secondary | ICD-10-CM | POA: Insufficient documentation

## 2015-10-30 DIAGNOSIS — Z992 Dependence on renal dialysis: Secondary | ICD-10-CM | POA: Diagnosis not present

## 2015-10-30 DIAGNOSIS — R0602 Shortness of breath: Secondary | ICD-10-CM | POA: Diagnosis not present

## 2015-10-30 DIAGNOSIS — Z8589 Personal history of malignant neoplasm of other organs and systems: Secondary | ICD-10-CM | POA: Diagnosis not present

## 2015-10-30 DIAGNOSIS — I120 Hypertensive chronic kidney disease with stage 5 chronic kidney disease or end stage renal disease: Secondary | ICD-10-CM | POA: Insufficient documentation

## 2015-10-30 DIAGNOSIS — Z96651 Presence of right artificial knee joint: Secondary | ICD-10-CM | POA: Diagnosis not present

## 2015-10-30 DIAGNOSIS — N185 Chronic kidney disease, stage 5: Secondary | ICD-10-CM | POA: Diagnosis not present

## 2015-10-30 DIAGNOSIS — Z6835 Body mass index (BMI) 35.0-35.9, adult: Secondary | ICD-10-CM | POA: Diagnosis not present

## 2015-10-30 DIAGNOSIS — T82898A Other specified complication of vascular prosthetic devices, implants and grafts, initial encounter: Secondary | ICD-10-CM | POA: Diagnosis not present

## 2015-10-30 HISTORY — DX: End stage renal disease: N18.6

## 2015-10-30 HISTORY — DX: Pleural effusion, not elsewhere classified: J90

## 2015-10-30 HISTORY — PX: LIGATION OF COMPETING BRANCHES OF ARTERIOVENOUS FISTULA: SHX5949

## 2015-10-30 HISTORY — PX: FISTULA SUPERFICIALIZATION: SHX6341

## 2015-10-30 HISTORY — DX: Dependence on renal dialysis: Z99.2

## 2015-10-30 LAB — SURGICAL PCR SCREEN
MRSA, PCR: NEGATIVE
Staphylococcus aureus: NEGATIVE

## 2015-10-30 LAB — POCT I-STAT 4, (NA,K, GLUC, HGB,HCT)
GLUCOSE: 82 mg/dL (ref 65–99)
HCT: 31 % — ABNORMAL LOW (ref 39.0–52.0)
Hemoglobin: 10.5 g/dL — ABNORMAL LOW (ref 13.0–17.0)
POTASSIUM: 4.6 mmol/L (ref 3.5–5.1)
Sodium: 139 mmol/L (ref 135–145)

## 2015-10-30 SURGERY — LIGATION OF COMPETING BRANCHES OF ARTERIOVENOUS FISTULA
Anesthesia: Monitor Anesthesia Care | Site: Arm Upper | Laterality: Right

## 2015-10-30 MED ORDER — PROPOFOL 10 MG/ML IV BOLUS
INTRAVENOUS | Status: AC
  Filled 2015-10-30: qty 20

## 2015-10-30 MED ORDER — LIDOCAINE-EPINEPHRINE (PF) 1 %-1:200000 IJ SOLN
INTRAMUSCULAR | Status: AC
  Filled 2015-10-30: qty 30

## 2015-10-30 MED ORDER — PHENYLEPHRINE 40 MCG/ML (10ML) SYRINGE FOR IV PUSH (FOR BLOOD PRESSURE SUPPORT)
PREFILLED_SYRINGE | INTRAVENOUS | Status: AC
  Filled 2015-10-30: qty 10

## 2015-10-30 MED ORDER — SODIUM CHLORIDE 0.9 % IV SOLN
INTRAVENOUS | Status: DC | PRN
  Administered 2015-10-30 (×2): via INTRAVENOUS

## 2015-10-30 MED ORDER — SODIUM CHLORIDE 0.9 % IV SOLN
INTRAVENOUS | Status: DC | PRN
  Administered 2015-10-30: 10:00:00

## 2015-10-30 MED ORDER — LIDOCAINE 2% (20 MG/ML) 5 ML SYRINGE
INTRAMUSCULAR | Status: AC
  Filled 2015-10-30: qty 5

## 2015-10-30 MED ORDER — LIDOCAINE-EPINEPHRINE (PF) 1 %-1:200000 IJ SOLN
INTRAMUSCULAR | Status: DC | PRN
  Administered 2015-10-30: 20 mL

## 2015-10-30 MED ORDER — 0.9 % SODIUM CHLORIDE (POUR BTL) OPTIME
TOPICAL | Status: DC | PRN
  Administered 2015-10-30: 1000 mL

## 2015-10-30 MED ORDER — MIDAZOLAM HCL 2 MG/2ML IJ SOLN
INTRAMUSCULAR | Status: AC
  Filled 2015-10-30: qty 2

## 2015-10-30 MED ORDER — FENTANYL CITRATE (PF) 100 MCG/2ML IJ SOLN
INTRAMUSCULAR | Status: DC | PRN
  Administered 2015-10-30 (×2): 50 ug via INTRAVENOUS

## 2015-10-30 MED ORDER — CHLORHEXIDINE GLUCONATE CLOTH 2 % EX PADS: 6.0000 | MEDICATED_PAD | Freq: Once | CUTANEOUS | Status: DC

## 2015-10-30 MED ORDER — FENTANYL CITRATE (PF) 100 MCG/2ML IJ SOLN
INTRAMUSCULAR | Status: AC
  Filled 2015-10-30: qty 2

## 2015-10-30 MED ORDER — PROPOFOL 500 MG/50ML IV EMUL
INTRAVENOUS | Status: DC | PRN
  Administered 2015-10-30: 50 ug/kg/min via INTRAVENOUS

## 2015-10-30 MED ORDER — MIDAZOLAM HCL 5 MG/5ML IJ SOLN
INTRAMUSCULAR | Status: DC | PRN
  Administered 2015-10-30: 2 mg via INTRAVENOUS

## 2015-10-30 MED ORDER — ONDANSETRON HCL 4 MG/2ML IJ SOLN
INTRAMUSCULAR | Status: AC
  Filled 2015-10-30: qty 2

## 2015-10-30 MED ORDER — SODIUM CHLORIDE 0.9 % IV SOLN
INTRAVENOUS | Status: DC
  Administered 2015-10-30: 09:00:00 via INTRAVENOUS

## 2015-10-30 SURGICAL SUPPLY — 37 items
ARMBAND PINK RESTRICT EXTREMIT (MISCELLANEOUS) ×3 IMPLANT
CANISTER SUCTION 2500CC (MISCELLANEOUS) ×3 IMPLANT
CANNULA VESSEL 3MM 2 BLNT TIP (CANNULA) ×3 IMPLANT
CLIP TI MEDIUM 6 (CLIP) ×3 IMPLANT
CLIP TI WIDE RED SMALL 6 (CLIP) ×6 IMPLANT
ELECT REM PT RETURN 9FT ADLT (ELECTROSURGICAL) ×3
ELECTRODE REM PT RTRN 9FT ADLT (ELECTROSURGICAL) ×2 IMPLANT
GAUZE SPONGE 4X4 12PLY STRL (GAUZE/BANDAGES/DRESSINGS) IMPLANT
GEL ULTRASOUND 20GR AQUASONIC (MISCELLANEOUS) IMPLANT
GLOVE BIO SURGEON STRL SZ 6 (GLOVE) ×3 IMPLANT
GLOVE BIO SURGEON STRL SZ7 (GLOVE) ×3 IMPLANT
GLOVE BIO SURGEON STRL SZ7.5 (GLOVE) ×3 IMPLANT
GLOVE BIOGEL PI IND STRL 6 (GLOVE) ×2 IMPLANT
GLOVE BIOGEL PI IND STRL 7.5 (GLOVE) ×2 IMPLANT
GLOVE BIOGEL PI IND STRL 8 (GLOVE) ×2 IMPLANT
GLOVE BIOGEL PI INDICATOR 6 (GLOVE) ×1
GLOVE BIOGEL PI INDICATOR 7.5 (GLOVE) ×1
GLOVE BIOGEL PI INDICATOR 8 (GLOVE) ×1
GOWN STRL REUS W/ TWL LRG LVL3 (GOWN DISPOSABLE) ×6 IMPLANT
GOWN STRL REUS W/TWL LRG LVL3 (GOWN DISPOSABLE) ×3
KIT BASIN OR (CUSTOM PROCEDURE TRAY) ×3 IMPLANT
KIT ROOM TURNOVER OR (KITS) ×3 IMPLANT
LIQUID BAND (GAUZE/BANDAGES/DRESSINGS) ×3 IMPLANT
NS IRRIG 1000ML POUR BTL (IV SOLUTION) ×3 IMPLANT
PACK CV ACCESS (CUSTOM PROCEDURE TRAY) ×3 IMPLANT
PAD ARMBOARD 7.5X6 YLW CONV (MISCELLANEOUS) ×6 IMPLANT
SPONGE SURGIFOAM ABS GEL 100 (HEMOSTASIS) IMPLANT
SUT ETHILON 3 0 PS 1 (SUTURE) IMPLANT
SUT PROLENE 6 0 BV (SUTURE) IMPLANT
SUT SILK 0 TIES 10X30 (SUTURE) ×3 IMPLANT
SUT VIC AB 3-0 SH 27 (SUTURE) ×2
SUT VIC AB 3-0 SH 27X BRD (SUTURE) ×2 IMPLANT
SUT VICRYL 4-0 PS2 18IN ABS (SUTURE) ×3 IMPLANT
SWAB COLLECTION DEVICE MRSA (MISCELLANEOUS) IMPLANT
TUBE ANAEROBIC SPECIMEN COL (MISCELLANEOUS) IMPLANT
UNDERPAD 30X30 (UNDERPADS AND DIAPERS) ×3 IMPLANT
WATER STERILE IRR 1000ML POUR (IV SOLUTION) ×3 IMPLANT

## 2015-10-30 NOTE — Transfer of Care (Signed)
Immediate Anesthesia Transfer of Care Note  Patient: Jesus [name]  Procedure(s) Performed: Procedure(s): LIGATION OF COMPETING BRANCHES OF BRACHIOCEPHALIC ARTERIOVENOUS FISTULA (Right) SUPERFICIALIZATION BRACHIOCEPHALIC ARTERIOVENOUS FISTULA Right Arm (Right)  Patient Location: PACU  Anesthesia Type:MAC  Level of Consciousness: awake, alert  and oriented  Airway & Oxygen Therapy: Patient Spontanous Breathing and Patient connected to nasal cannula oxygen  Post-op Assessment: Report given to RN, Post -op Vital signs reviewed and stable and Patient moving all extremities X 4  Post vital signs: Reviewed and stable  Last Vitals:  Vitals:   10/30/15 1136 10/30/15 1143  BP:  (!) 163/88  Pulse: (!) 57 69  Resp: 17 18  Temp:      Last Pain: There were no vitals filed for this visit.       Complications: No apparent anesthesia complications

## 2015-10-30 NOTE — Transfer of Care (Signed)
Immediate Anesthesia Transfer of Care Note  Patient: Jesus [name]  Procedure(s) Performed: Procedure(s): LIGATION OF COMPETING BRANCHES OF BRACHIOCEPHALIC ARTERIOVENOUS FISTULA (Right) SUPERFICIALIZATION BRACHIOCEPHALIC ARTERIOVENOUS FISTULA Right Arm (Right)  Patient Location: PACU  Anesthesia Type:MAC  Level of Consciousness: awake, alert  and oriented  Airway & Oxygen Therapy: Patient Spontanous Breathing  Post-op Assessment: Report given to RN  Post vital signs: Reviewed and stable  Last Vitals:  Vitals:   10/30/15 0811  BP: (!) 175/85  Pulse: (!) 58  Resp: 18  Temp: 36.5 C    Last Pain: There were no vitals filed for this visit.       Complications: No apparent anesthesia complications

## 2015-10-30 NOTE — Telephone Encounter (Signed)
-----   Message from Mena Goes, RN sent at 10/30/2015 11:59 AM EDT ----- Regarding: schedule 3 weeks   ----- Message ----- From: Ulyses Amor, PA-C Sent: 10/30/2015  11:35 AM To: [facility] Charge Pool  S/P revision right av fistula f/u with fistula duplex in 3 weeks

## 2015-10-30 NOTE — H&P (View-Only) (Signed)
Patient name: Jesus [name] MRN: [id number] DOB: 08/09/1950 Sex: male  REASON FOR VISIT: Follow up of AV fistula  HPI: Jesus [name] is a 65 y.o. male who had a right brachiocephalic fistula placed on 06/05/15.  The patient dialyzes with a catheter on Tuesdays Thursdays and Saturdays. The fistula was not maturing adequately and on 09/07/2015, the patient underwent a fistulogram and venoplasty of a stenosis within the brachiocephalic fistula. There was an area of stenosis over a proximally 4 cm which was successfully addressed with balloon angioplasty. There was also one competing branches divided adjacent to the area of stenosis. There was no central venous stenosis.  He has no specific complaints referrable to his right upper extremity.  Current Outpatient Prescriptions  Medication Sig Dispense Refill  . ALPRAZolam (XANAX) 1 MG tablet Take 1 mg by mouth 3 (three) times daily as needed for anxiety.     . metoprolol tartrate (LOPRESSOR) 25 MG tablet Take 75 mg by mouth 2 (two) times daily.     Marland Kitchen oxyCODONE-acetaminophen (ROXICET) 5-325 MG tablet Take 1-2 tablets by mouth every 4 (four) hours as needed. (Patient taking differently: Take 1-2 tablets by mouth every 4 (four) hours as needed for moderate pain. ) 30 tablet 0  . Polyethyl Glycol-Propyl Glycol (SYSTANE OP) Place 1 drop into both eyes daily as needed (for dry, itchy eyes).    . sevelamer carbonate (RENVELA) 800 MG tablet Take 1,600 mg by mouth 3 (three) times daily with meals.     No current facility-administered medications for this visit.     REVIEW OF SYSTEMS:  [X]  denotes positive finding, [ ]  denotes negative finding Cardiac  Comments:  Chest pain or chest pressure:    Shortness of breath upon exertion:    Short of breath when lying flat:    Irregular heart rhythm:    Constitutional    Fever or chills:      PHYSICAL EXAM: Vitals:   10/21/15 1305 10/21/15 1307  BP: (!) 159/86 (!) 163/86  Pulse: (!) 53   Resp: 16   Temp:  98.7 F (37.1 C)   TempSrc: Oral   SpO2: 100%   Weight: 248 lb (112.5 kg)   Height: 5\' 10"  (1.778 m)     GENERAL: The patient is a well-nourished male, in no acute distress. The vital signs are documented above. CARDIOVASCULAR: There is a regular rate and rhythm. PULMONARY: There is good air exchange bilaterally without wheezing or rales. The proximal fistula has an excellent thrill. It then becomes somewhat pulsatile and smaller in the mid upper arm. He has a palpable right radial pulse.  I have reviewed his fistulogram which shows the area of stenosis which appeared to have a reasonable result with venoplasty. There was an adjacent competing branch. The arterial anastomosis looked fine and there were no other real problems at the 5. There was no central venous stenosis.  DUPLEX OF DIALYSIS FISTULA: I have independently interpreted the duplex of the dialysis fistula which shows that the diameters of the fistula range from 0.33-0.6 cm. There is an area of increased velocities in the mid upper arm.  MEDICAL ISSUES:  STATUS POST RIGHT BRACHIOCEPHALIC AV FISTULA: He appears to develop some recurrent stenosis in the mid upper arm. He also has a competing branch in this area. I have recommended patch angioplasty of this area of stenosis and ligation of the competing branch. The fistula otherwise looks to be satisfactory so I hate to give up on this fistula.  However, if this is not successful that he would require either a basilic vein transposition on the right or an AV graft. His surgery is scheduled for 10/23/2015. This is a nondialysis day.  HYPERTENSION: The patient's initial blood pressure today was elevated. We repeated this and this was still elevated. We have encouraged the patient to follow up with their primary care physician for management of their blood pressure.   Deitra Mayo [facility] [place] [phone]

## 2015-10-30 NOTE — Telephone Encounter (Signed)
Sched lab 9/20 at 4:00 and MD 9/21 at 12:30. Lm on hm# to inform pt.

## 2015-10-30 NOTE — Anesthesia Postprocedure Evaluation (Signed)
Anesthesia Post Note  Patient: Jesus [name]  Procedure(s) Performed: Procedure(s) (LRB): LIGATION OF COMPETING BRANCHES OF BRACHIOCEPHALIC ARTERIOVENOUS FISTULA (Right) SUPERFICIALIZATION BRACHIOCEPHALIC ARTERIOVENOUS FISTULA Right Arm (Right)  Patient location during evaluation: PACU Anesthesia Type: MAC Level of consciousness: awake and alert Pain management: pain level controlled Vital Signs Assessment: post-procedure vital signs reviewed and stable Respiratory status: spontaneous breathing, nonlabored ventilation and respiratory function stable Cardiovascular status: stable and blood pressure returned to baseline Anesthetic complications: no    Last Vitals:  Vitals:   10/30/15 1136 10/30/15 1143  BP:  (!) 163/88  Pulse: (!) 57 69  Resp: 17 18  Temp:      Last Pain: There were no vitals filed for this visit.               Letanya Froh A

## 2015-10-30 NOTE — Op Note (Signed)
    NAME: Jesus [name]   MRN: [id number] DOB: 08/24/1950    DATE OF OPERATION: 10/30/2015  PREOP DIAGNOSIS: slowly maturing right brachiocephalic AV fistula  POSTOP DIAGNOSIS: same  PROCEDURE: Superficialization of right brachiocephalic AV fistula and ligation of 3 competing branches  SURGEON: Judeth Cornfield. Scot Dock, MD, FACS  ASSIST: Gerri Lins PA  ANESTHESIA: local with sedation   EBL: minimal  INDICATIONS: [name] is a 65 y.o. male who has a right brachiocephalic fistula which was not adequate in size. Duplex showed a large competing branch and possibly an area of narrowing in the mid segment of the fistula. He presents for ligation of the competing branch and revision possibly.  FINDINGS: 3 competing branches were identified and ligated. The vein was deep in the upper arm and therefore I superficialized it. There was minimal narrowing adjacent to the branch but this did not appear to be intimal hyperplasia. The vein had an excellent thrill at the completion.  TECHNIQUE: The patient was taken to the operating room and sedated by anesthesia. The right upper extremity was prepped and draped in usual sterile fashion. By duplex 3 significant competing branches were identified and these were marked. After the skin was anesthetized, an incision was made over this area and the vein was fully mobilized. The 3 competing branches were ligated and divided between 3-0 silk ties. I then mobilized the vein by dividing the fascia overlying the vein in this cause of vein to dilate some. There was only one small area that was slightly smaller but there was no intimal hyperplasia here present and this was adjacent to a branch. The fistula had an excellent thrill and my feeling was that this area would gradually dilate. Hemostasis was obtained in the wound. The wound was closed with a 40 septic or stitch. Sterile dressing was applied. The patient tolerated the procedure well and was transferred to the  recovery room in stable condition. All needle and sponge counts were correct.  Deitra Mayo, MD, FACS [facility]  DATE OF DICTATION:   10/30/2015

## 2015-10-30 NOTE — Interval H&P Note (Signed)
History and Physical Interval Note:  10/30/2015 9:50 AM  Jesus [name]  has presented today for surgery, with the diagnosis of End Stage Renal Disease N18.6  The various methods of treatment have been discussed with the patient and family. After consideration of risks, benefits and other options for treatment, the patient has consented to  Procedure(s): PATCH ANGIOPLASTY BRACHIOCEPHALIC ARTERIOVENOUS FISTULA (Right) LIGATION OF COMPETING BRANCHES OF BRACHIOCEPHALIC ARTERIOVENOUS FISTULA (Right) as a surgical intervention .  The patient's history has been reviewed, patient examined, no change in status, stable for surgery.  I have reviewed the patient's chart and labs.  Questions were answered to the patient's satisfaction.     Deitra Mayo

## 2015-10-31 DIAGNOSIS — N2581 Secondary hyperparathyroidism of renal origin: Secondary | ICD-10-CM | POA: Diagnosis not present

## 2015-10-31 DIAGNOSIS — Z992 Dependence on renal dialysis: Secondary | ICD-10-CM | POA: Diagnosis not present

## 2015-10-31 DIAGNOSIS — D631 Anemia in chronic kidney disease: Secondary | ICD-10-CM | POA: Diagnosis not present

## 2015-10-31 DIAGNOSIS — N186 End stage renal disease: Secondary | ICD-10-CM | POA: Diagnosis not present

## 2015-10-31 DIAGNOSIS — D509 Iron deficiency anemia, unspecified: Secondary | ICD-10-CM | POA: Diagnosis not present

## 2015-11-02 ENCOUNTER — Encounter (HOSPITAL_COMMUNITY): Payer: Self-pay | Admitting: Vascular Surgery

## 2015-11-03 DIAGNOSIS — N2581 Secondary hyperparathyroidism of renal origin: Secondary | ICD-10-CM | POA: Diagnosis not present

## 2015-11-03 DIAGNOSIS — Z992 Dependence on renal dialysis: Secondary | ICD-10-CM | POA: Diagnosis not present

## 2015-11-03 DIAGNOSIS — D631 Anemia in chronic kidney disease: Secondary | ICD-10-CM | POA: Diagnosis not present

## 2015-11-03 DIAGNOSIS — D509 Iron deficiency anemia, unspecified: Secondary | ICD-10-CM | POA: Diagnosis not present

## 2015-11-03 DIAGNOSIS — N186 End stage renal disease: Secondary | ICD-10-CM | POA: Diagnosis not present

## 2015-11-05 DIAGNOSIS — D631 Anemia in chronic kidney disease: Secondary | ICD-10-CM | POA: Diagnosis not present

## 2015-11-05 DIAGNOSIS — N2581 Secondary hyperparathyroidism of renal origin: Secondary | ICD-10-CM | POA: Diagnosis not present

## 2015-11-05 DIAGNOSIS — D509 Iron deficiency anemia, unspecified: Secondary | ICD-10-CM | POA: Diagnosis not present

## 2015-11-05 DIAGNOSIS — Z992 Dependence on renal dialysis: Secondary | ICD-10-CM | POA: Diagnosis not present

## 2015-11-05 DIAGNOSIS — N186 End stage renal disease: Secondary | ICD-10-CM | POA: Diagnosis not present

## 2015-11-07 DIAGNOSIS — Z992 Dependence on renal dialysis: Secondary | ICD-10-CM | POA: Diagnosis not present

## 2015-11-07 DIAGNOSIS — N186 End stage renal disease: Secondary | ICD-10-CM | POA: Diagnosis not present

## 2015-11-07 DIAGNOSIS — D509 Iron deficiency anemia, unspecified: Secondary | ICD-10-CM | POA: Diagnosis not present

## 2015-11-07 DIAGNOSIS — D631 Anemia in chronic kidney disease: Secondary | ICD-10-CM | POA: Diagnosis not present

## 2015-11-07 DIAGNOSIS — N2581 Secondary hyperparathyroidism of renal origin: Secondary | ICD-10-CM | POA: Diagnosis not present

## 2015-11-09 DIAGNOSIS — N186 End stage renal disease: Secondary | ICD-10-CM | POA: Diagnosis not present

## 2015-11-09 DIAGNOSIS — N2581 Secondary hyperparathyroidism of renal origin: Secondary | ICD-10-CM | POA: Diagnosis not present

## 2015-11-09 DIAGNOSIS — D509 Iron deficiency anemia, unspecified: Secondary | ICD-10-CM | POA: Diagnosis not present

## 2015-11-09 DIAGNOSIS — D631 Anemia in chronic kidney disease: Secondary | ICD-10-CM | POA: Diagnosis not present

## 2015-11-09 DIAGNOSIS — Z992 Dependence on renal dialysis: Secondary | ICD-10-CM | POA: Diagnosis not present

## 2015-11-12 DIAGNOSIS — D631 Anemia in chronic kidney disease: Secondary | ICD-10-CM | POA: Diagnosis not present

## 2015-11-12 DIAGNOSIS — N186 End stage renal disease: Secondary | ICD-10-CM | POA: Diagnosis not present

## 2015-11-12 DIAGNOSIS — N2581 Secondary hyperparathyroidism of renal origin: Secondary | ICD-10-CM | POA: Diagnosis not present

## 2015-11-12 DIAGNOSIS — D509 Iron deficiency anemia, unspecified: Secondary | ICD-10-CM | POA: Diagnosis not present

## 2015-11-12 DIAGNOSIS — Z992 Dependence on renal dialysis: Secondary | ICD-10-CM | POA: Diagnosis not present

## 2015-11-14 DIAGNOSIS — D509 Iron deficiency anemia, unspecified: Secondary | ICD-10-CM | POA: Diagnosis not present

## 2015-11-14 DIAGNOSIS — N2581 Secondary hyperparathyroidism of renal origin: Secondary | ICD-10-CM | POA: Diagnosis not present

## 2015-11-14 DIAGNOSIS — Z992 Dependence on renal dialysis: Secondary | ICD-10-CM | POA: Diagnosis not present

## 2015-11-14 DIAGNOSIS — D631 Anemia in chronic kidney disease: Secondary | ICD-10-CM | POA: Diagnosis not present

## 2015-11-14 DIAGNOSIS — N186 End stage renal disease: Secondary | ICD-10-CM | POA: Diagnosis not present

## 2015-11-17 DIAGNOSIS — D509 Iron deficiency anemia, unspecified: Secondary | ICD-10-CM | POA: Diagnosis not present

## 2015-11-17 DIAGNOSIS — N2581 Secondary hyperparathyroidism of renal origin: Secondary | ICD-10-CM | POA: Diagnosis not present

## 2015-11-17 DIAGNOSIS — N186 End stage renal disease: Secondary | ICD-10-CM | POA: Diagnosis not present

## 2015-11-17 DIAGNOSIS — D631 Anemia in chronic kidney disease: Secondary | ICD-10-CM | POA: Diagnosis not present

## 2015-11-17 DIAGNOSIS — Z992 Dependence on renal dialysis: Secondary | ICD-10-CM | POA: Diagnosis not present

## 2015-11-18 ENCOUNTER — Encounter (HOSPITAL_COMMUNITY): Payer: Medicare Other

## 2015-11-19 ENCOUNTER — Encounter: Payer: Medicare Other | Admitting: Vascular Surgery

## 2015-11-19 DIAGNOSIS — Z992 Dependence on renal dialysis: Secondary | ICD-10-CM | POA: Diagnosis not present

## 2015-11-19 DIAGNOSIS — N2581 Secondary hyperparathyroidism of renal origin: Secondary | ICD-10-CM | POA: Diagnosis not present

## 2015-11-19 DIAGNOSIS — D631 Anemia in chronic kidney disease: Secondary | ICD-10-CM | POA: Diagnosis not present

## 2015-11-19 DIAGNOSIS — N186 End stage renal disease: Secondary | ICD-10-CM | POA: Diagnosis not present

## 2015-11-19 DIAGNOSIS — D509 Iron deficiency anemia, unspecified: Secondary | ICD-10-CM | POA: Diagnosis not present

## 2015-11-20 ENCOUNTER — Encounter: Payer: Self-pay | Admitting: Vascular Surgery

## 2015-11-21 DIAGNOSIS — Z992 Dependence on renal dialysis: Secondary | ICD-10-CM | POA: Diagnosis not present

## 2015-11-21 DIAGNOSIS — N186 End stage renal disease: Secondary | ICD-10-CM | POA: Diagnosis not present

## 2015-11-21 DIAGNOSIS — D509 Iron deficiency anemia, unspecified: Secondary | ICD-10-CM | POA: Diagnosis not present

## 2015-11-21 DIAGNOSIS — N2581 Secondary hyperparathyroidism of renal origin: Secondary | ICD-10-CM | POA: Diagnosis not present

## 2015-11-21 DIAGNOSIS — D631 Anemia in chronic kidney disease: Secondary | ICD-10-CM | POA: Diagnosis not present

## 2015-11-23 ENCOUNTER — Other Ambulatory Visit: Payer: Self-pay | Admitting: *Deleted

## 2015-11-23 DIAGNOSIS — Z4931 Encounter for adequacy testing for hemodialysis: Secondary | ICD-10-CM

## 2015-11-23 DIAGNOSIS — N186 End stage renal disease: Secondary | ICD-10-CM

## 2015-11-24 DIAGNOSIS — N186 End stage renal disease: Secondary | ICD-10-CM | POA: Diagnosis not present

## 2015-11-24 DIAGNOSIS — N2581 Secondary hyperparathyroidism of renal origin: Secondary | ICD-10-CM | POA: Diagnosis not present

## 2015-11-24 DIAGNOSIS — D509 Iron deficiency anemia, unspecified: Secondary | ICD-10-CM | POA: Diagnosis not present

## 2015-11-24 DIAGNOSIS — D631 Anemia in chronic kidney disease: Secondary | ICD-10-CM | POA: Diagnosis not present

## 2015-11-24 DIAGNOSIS — Z992 Dependence on renal dialysis: Secondary | ICD-10-CM | POA: Diagnosis not present

## 2015-11-25 ENCOUNTER — Ambulatory Visit (INDEPENDENT_AMBULATORY_CARE_PROVIDER_SITE_OTHER): Payer: Medicare Other | Admitting: Vascular Surgery

## 2015-11-25 ENCOUNTER — Ambulatory Visit (HOSPITAL_COMMUNITY)
Admission: RE | Admit: 2015-11-25 | Discharge: 2015-11-25 | Disposition: A | Discharge status: Home / Self Care | Payer: Medicare Other | Source: Ambulatory Visit | Attending: Vascular Surgery | Admitting: Vascular Surgery

## 2015-11-25 ENCOUNTER — Encounter: Payer: Self-pay | Admitting: Vascular Surgery

## 2015-11-25 VITALS — BP 161/80 | HR 55 | Temp 98.8°F | Resp 14 | Ht 70.0 in | Wt 246.3 lb

## 2015-11-25 DIAGNOSIS — N186 End stage renal disease: Secondary | ICD-10-CM

## 2015-11-25 DIAGNOSIS — Z992 Dependence on renal dialysis: Secondary | ICD-10-CM | POA: Insufficient documentation

## 2015-11-25 DIAGNOSIS — Z4931 Encounter for adequacy testing for hemodialysis: Secondary | ICD-10-CM | POA: Diagnosis not present

## 2015-11-25 NOTE — Progress Notes (Signed)
  POST OPERATIVE OFFICE NOTE    CC:  F/u for surgery  HPI:  This is a 65 y.o. male who dialyzes T/T/S.  He originally had a right BC AVF placed on 06/05/15. He was seen back in July b/c his fistula was not maturing adequately and on 09/07/15 had udnergone fistulogram with venoplasty of stenosis within the [facility] AVF.  There was no central venous stenosis.  At his post procedure visit, he was found to have a recurrent stenosis in the mid upper arm. He also has a competing branch in this area. I have recommended patch angioplasty of this area of stenosis and ligation of the competing branch. The fistula otherwise looks to be satisfactory so I hate to give up on this fistula. However, if this is not successful that he would require either a basilic vein transposition on the right or an AV graft.  He is s/p superficialization of right brachiocephalic AVF and ligation of three competing branches on 10/30/15 by Dr. Scot Dock.  He states he does not have any hand pain.  They are using his catheter for dialysis.       Allergies  Allergen Reactions  . No Known Allergies     Current Outpatient Prescriptions  Medication Sig Dispense Refill  . ALPRAZolam (XANAX) 1 MG tablet Take 1 mg by mouth 3 (three) times daily as needed for anxiety.     Marland Kitchen amLODipine (NORVASC) 2.5 MG tablet Take 1 tablet by mouth daily.    . metoprolol succinate (TOPROL-XL) 50 MG 24 hr tablet Take 75 mg by mouth every evening.    Marland Kitchen oxyCODONE-acetaminophen (ROXICET) 5-325 MG tablet Take 1-2 tablets by mouth every 4 (four) hours as needed. (Patient taking differently: Take 1-2 tablets by mouth every 4 (four) hours as needed for moderate pain. ) 30 tablet 0  . Polyethyl Glycol-Propyl Glycol (SYSTANE OP) Place 1 drop into both eyes daily as needed (for dry, itchy eyes).    . sevelamer carbonate (RENVELA) 800 MG tablet Take 1,600 mg by mouth 3 (three) times daily with meals.     No current facility-administered medications for this visit.      ROS:  See HPI  Physical Exam:  There were no vitals filed for this visit.  Incision:  Healing nicely. Extremities:  2+ right radial pulse; pulsatile with thrill distally; better thrill proximally   Assessment/Plan:  This is a 65 y.o. male who is s/p: superficialization of right BC AVF and ligation of competing branches.   -Study today reveals no internal vessel narrowing or residula lumen of less than 90mm within outflow vein or anastomosis.  -the distal portion of the fistula is slightly pulsatile, but has a good thrill within it.  May start using the fistula on 12/17/15 distal to the incision.  May stick over incision once the incision heals. -he may need a fistulogram in the future if unable to use.  We will see him back as needed. -catheter may be removed once the fistula has been used three times without difficulty.    Leontine Locket, PA-C [facility] [phone]  I have interviewed the patient and examined the patient. I agree with the findings by the PA. Fistula has an excellent thrill and I think it could be accessed. If there are problems then we could consider a fistulogram to further evaluate the areas of increased velocities in the mid upper arm.  Gae Gallop, MD [phone]

## 2015-11-26 DIAGNOSIS — N186 End stage renal disease: Secondary | ICD-10-CM | POA: Diagnosis not present

## 2015-11-26 DIAGNOSIS — D509 Iron deficiency anemia, unspecified: Secondary | ICD-10-CM | POA: Diagnosis not present

## 2015-11-26 DIAGNOSIS — Z992 Dependence on renal dialysis: Secondary | ICD-10-CM | POA: Diagnosis not present

## 2015-11-26 DIAGNOSIS — D631 Anemia in chronic kidney disease: Secondary | ICD-10-CM | POA: Diagnosis not present

## 2015-11-26 DIAGNOSIS — N2581 Secondary hyperparathyroidism of renal origin: Secondary | ICD-10-CM | POA: Diagnosis not present

## 2015-11-28 DIAGNOSIS — N186 End stage renal disease: Secondary | ICD-10-CM | POA: Diagnosis not present

## 2015-11-28 DIAGNOSIS — Z992 Dependence on renal dialysis: Secondary | ICD-10-CM | POA: Diagnosis not present

## 2015-11-28 DIAGNOSIS — N2581 Secondary hyperparathyroidism of renal origin: Secondary | ICD-10-CM | POA: Diagnosis not present

## 2015-11-28 DIAGNOSIS — D631 Anemia in chronic kidney disease: Secondary | ICD-10-CM | POA: Diagnosis not present

## 2015-11-28 DIAGNOSIS — D509 Iron deficiency anemia, unspecified: Secondary | ICD-10-CM | POA: Diagnosis not present

## 2015-11-30 DIAGNOSIS — D3131 Benign neoplasm of right choroid: Secondary | ICD-10-CM | POA: Diagnosis not present

## 2015-11-30 DIAGNOSIS — Z961 Presence of intraocular lens: Secondary | ICD-10-CM | POA: Diagnosis not present

## 2015-11-30 DIAGNOSIS — H35033 Hypertensive retinopathy, bilateral: Secondary | ICD-10-CM | POA: Diagnosis not present

## 2015-11-30 DIAGNOSIS — H40013 Open angle with borderline findings, low risk, bilateral: Secondary | ICD-10-CM | POA: Diagnosis not present

## 2015-11-30 DIAGNOSIS — H04123 Dry eye syndrome of bilateral lacrimal glands: Secondary | ICD-10-CM | POA: Diagnosis not present

## 2015-12-01 DIAGNOSIS — N2581 Secondary hyperparathyroidism of renal origin: Secondary | ICD-10-CM | POA: Diagnosis not present

## 2015-12-01 DIAGNOSIS — Z992 Dependence on renal dialysis: Secondary | ICD-10-CM | POA: Diagnosis not present

## 2015-12-01 DIAGNOSIS — D509 Iron deficiency anemia, unspecified: Secondary | ICD-10-CM | POA: Diagnosis not present

## 2015-12-01 DIAGNOSIS — N186 End stage renal disease: Secondary | ICD-10-CM | POA: Diagnosis not present

## 2015-12-01 DIAGNOSIS — D631 Anemia in chronic kidney disease: Secondary | ICD-10-CM | POA: Diagnosis not present

## 2015-12-02 DIAGNOSIS — M5090 Cervical disc disorder, unspecified, unspecified cervical region: Secondary | ICD-10-CM | POA: Diagnosis not present

## 2015-12-02 DIAGNOSIS — M5020 Other cervical disc displacement, unspecified cervical region: Secondary | ICD-10-CM | POA: Diagnosis not present

## 2015-12-03 DIAGNOSIS — N2581 Secondary hyperparathyroidism of renal origin: Secondary | ICD-10-CM | POA: Diagnosis not present

## 2015-12-03 DIAGNOSIS — N186 End stage renal disease: Secondary | ICD-10-CM | POA: Diagnosis not present

## 2015-12-03 DIAGNOSIS — D509 Iron deficiency anemia, unspecified: Secondary | ICD-10-CM | POA: Diagnosis not present

## 2015-12-03 DIAGNOSIS — D631 Anemia in chronic kidney disease: Secondary | ICD-10-CM | POA: Diagnosis not present

## 2015-12-03 DIAGNOSIS — Z992 Dependence on renal dialysis: Secondary | ICD-10-CM | POA: Diagnosis not present

## 2015-12-05 DIAGNOSIS — N2581 Secondary hyperparathyroidism of renal origin: Secondary | ICD-10-CM | POA: Diagnosis not present

## 2015-12-05 DIAGNOSIS — D631 Anemia in chronic kidney disease: Secondary | ICD-10-CM | POA: Diagnosis not present

## 2015-12-05 DIAGNOSIS — N186 End stage renal disease: Secondary | ICD-10-CM | POA: Diagnosis not present

## 2015-12-05 DIAGNOSIS — D509 Iron deficiency anemia, unspecified: Secondary | ICD-10-CM | POA: Diagnosis not present

## 2015-12-05 DIAGNOSIS — Z992 Dependence on renal dialysis: Secondary | ICD-10-CM | POA: Diagnosis not present

## 2015-12-08 DIAGNOSIS — N186 End stage renal disease: Secondary | ICD-10-CM | POA: Diagnosis not present

## 2015-12-08 DIAGNOSIS — D509 Iron deficiency anemia, unspecified: Secondary | ICD-10-CM | POA: Diagnosis not present

## 2015-12-08 DIAGNOSIS — N2581 Secondary hyperparathyroidism of renal origin: Secondary | ICD-10-CM | POA: Diagnosis not present

## 2015-12-08 DIAGNOSIS — Z992 Dependence on renal dialysis: Secondary | ICD-10-CM | POA: Diagnosis not present

## 2015-12-08 DIAGNOSIS — D631 Anemia in chronic kidney disease: Secondary | ICD-10-CM | POA: Diagnosis not present

## 2015-12-10 DIAGNOSIS — N186 End stage renal disease: Secondary | ICD-10-CM | POA: Diagnosis not present

## 2015-12-10 DIAGNOSIS — D631 Anemia in chronic kidney disease: Secondary | ICD-10-CM | POA: Diagnosis not present

## 2015-12-10 DIAGNOSIS — D509 Iron deficiency anemia, unspecified: Secondary | ICD-10-CM | POA: Diagnosis not present

## 2015-12-10 DIAGNOSIS — N2581 Secondary hyperparathyroidism of renal origin: Secondary | ICD-10-CM | POA: Diagnosis not present

## 2015-12-10 DIAGNOSIS — Z992 Dependence on renal dialysis: Secondary | ICD-10-CM | POA: Diagnosis not present

## 2015-12-12 DIAGNOSIS — N186 End stage renal disease: Secondary | ICD-10-CM | POA: Diagnosis not present

## 2015-12-12 DIAGNOSIS — N2581 Secondary hyperparathyroidism of renal origin: Secondary | ICD-10-CM | POA: Diagnosis not present

## 2015-12-12 DIAGNOSIS — Z992 Dependence on renal dialysis: Secondary | ICD-10-CM | POA: Diagnosis not present

## 2015-12-12 DIAGNOSIS — D509 Iron deficiency anemia, unspecified: Secondary | ICD-10-CM | POA: Diagnosis not present

## 2015-12-12 DIAGNOSIS — D631 Anemia in chronic kidney disease: Secondary | ICD-10-CM | POA: Diagnosis not present

## 2015-12-15 DIAGNOSIS — N2581 Secondary hyperparathyroidism of renal origin: Secondary | ICD-10-CM | POA: Diagnosis not present

## 2015-12-15 DIAGNOSIS — D509 Iron deficiency anemia, unspecified: Secondary | ICD-10-CM | POA: Diagnosis not present

## 2015-12-15 DIAGNOSIS — Z992 Dependence on renal dialysis: Secondary | ICD-10-CM | POA: Diagnosis not present

## 2015-12-15 DIAGNOSIS — N186 End stage renal disease: Secondary | ICD-10-CM | POA: Diagnosis not present

## 2015-12-15 DIAGNOSIS — D631 Anemia in chronic kidney disease: Secondary | ICD-10-CM | POA: Diagnosis not present

## 2015-12-17 DIAGNOSIS — N2581 Secondary hyperparathyroidism of renal origin: Secondary | ICD-10-CM | POA: Diagnosis not present

## 2015-12-17 DIAGNOSIS — D631 Anemia in chronic kidney disease: Secondary | ICD-10-CM | POA: Diagnosis not present

## 2015-12-17 DIAGNOSIS — D509 Iron deficiency anemia, unspecified: Secondary | ICD-10-CM | POA: Diagnosis not present

## 2015-12-17 DIAGNOSIS — Z992 Dependence on renal dialysis: Secondary | ICD-10-CM | POA: Diagnosis not present

## 2015-12-17 DIAGNOSIS — N186 End stage renal disease: Secondary | ICD-10-CM | POA: Diagnosis not present

## 2015-12-19 DIAGNOSIS — Z992 Dependence on renal dialysis: Secondary | ICD-10-CM | POA: Diagnosis not present

## 2015-12-19 DIAGNOSIS — D631 Anemia in chronic kidney disease: Secondary | ICD-10-CM | POA: Diagnosis not present

## 2015-12-19 DIAGNOSIS — N186 End stage renal disease: Secondary | ICD-10-CM | POA: Diagnosis not present

## 2015-12-19 DIAGNOSIS — N2581 Secondary hyperparathyroidism of renal origin: Secondary | ICD-10-CM | POA: Diagnosis not present

## 2015-12-19 DIAGNOSIS — D509 Iron deficiency anemia, unspecified: Secondary | ICD-10-CM | POA: Diagnosis not present

## 2015-12-22 DIAGNOSIS — D631 Anemia in chronic kidney disease: Secondary | ICD-10-CM | POA: Diagnosis not present

## 2015-12-22 DIAGNOSIS — D509 Iron deficiency anemia, unspecified: Secondary | ICD-10-CM | POA: Diagnosis not present

## 2015-12-22 DIAGNOSIS — N186 End stage renal disease: Secondary | ICD-10-CM | POA: Diagnosis not present

## 2015-12-22 DIAGNOSIS — Z992 Dependence on renal dialysis: Secondary | ICD-10-CM | POA: Diagnosis not present

## 2015-12-22 DIAGNOSIS — N2581 Secondary hyperparathyroidism of renal origin: Secondary | ICD-10-CM | POA: Diagnosis not present

## 2015-12-23 DIAGNOSIS — I10 Essential (primary) hypertension: Secondary | ICD-10-CM | POA: Diagnosis not present

## 2015-12-24 ENCOUNTER — Other Ambulatory Visit: Payer: Self-pay | Admitting: Nurse Practitioner

## 2015-12-24 DIAGNOSIS — D631 Anemia in chronic kidney disease: Secondary | ICD-10-CM | POA: Diagnosis not present

## 2015-12-24 DIAGNOSIS — R51 Headache: Principal | ICD-10-CM

## 2015-12-24 DIAGNOSIS — D509 Iron deficiency anemia, unspecified: Secondary | ICD-10-CM | POA: Diagnosis not present

## 2015-12-24 DIAGNOSIS — N186 End stage renal disease: Secondary | ICD-10-CM | POA: Diagnosis not present

## 2015-12-24 DIAGNOSIS — N2581 Secondary hyperparathyroidism of renal origin: Secondary | ICD-10-CM | POA: Diagnosis not present

## 2015-12-24 DIAGNOSIS — Z992 Dependence on renal dialysis: Secondary | ICD-10-CM | POA: Diagnosis not present

## 2015-12-24 DIAGNOSIS — R519 Headache, unspecified: Secondary | ICD-10-CM

## 2015-12-26 DIAGNOSIS — N2581 Secondary hyperparathyroidism of renal origin: Secondary | ICD-10-CM | POA: Diagnosis not present

## 2015-12-26 DIAGNOSIS — D509 Iron deficiency anemia, unspecified: Secondary | ICD-10-CM | POA: Diagnosis not present

## 2015-12-26 DIAGNOSIS — N186 End stage renal disease: Secondary | ICD-10-CM | POA: Diagnosis not present

## 2015-12-26 DIAGNOSIS — Z992 Dependence on renal dialysis: Secondary | ICD-10-CM | POA: Diagnosis not present

## 2015-12-26 DIAGNOSIS — D631 Anemia in chronic kidney disease: Secondary | ICD-10-CM | POA: Diagnosis not present

## 2015-12-29 DIAGNOSIS — D509 Iron deficiency anemia, unspecified: Secondary | ICD-10-CM | POA: Diagnosis not present

## 2015-12-29 DIAGNOSIS — N2581 Secondary hyperparathyroidism of renal origin: Secondary | ICD-10-CM | POA: Diagnosis not present

## 2015-12-29 DIAGNOSIS — N186 End stage renal disease: Secondary | ICD-10-CM | POA: Diagnosis not present

## 2015-12-29 DIAGNOSIS — Z992 Dependence on renal dialysis: Secondary | ICD-10-CM | POA: Diagnosis not present

## 2015-12-29 DIAGNOSIS — D631 Anemia in chronic kidney disease: Secondary | ICD-10-CM | POA: Diagnosis not present

## 2015-12-31 DIAGNOSIS — D631 Anemia in chronic kidney disease: Secondary | ICD-10-CM | POA: Diagnosis not present

## 2015-12-31 DIAGNOSIS — N2581 Secondary hyperparathyroidism of renal origin: Secondary | ICD-10-CM | POA: Diagnosis not present

## 2015-12-31 DIAGNOSIS — D509 Iron deficiency anemia, unspecified: Secondary | ICD-10-CM | POA: Diagnosis not present

## 2015-12-31 DIAGNOSIS — Z992 Dependence on renal dialysis: Secondary | ICD-10-CM | POA: Diagnosis not present

## 2015-12-31 DIAGNOSIS — N186 End stage renal disease: Secondary | ICD-10-CM | POA: Diagnosis not present

## 2016-01-01 ENCOUNTER — Other Ambulatory Visit: Payer: Self-pay | Admitting: Nurse Practitioner

## 2016-01-01 ENCOUNTER — Ambulatory Visit
Admission: RE | Admit: 2016-01-01 | Discharge: 2016-01-01 | Disposition: A | Discharge status: Home / Self Care | Payer: Medicare Other | Source: Ambulatory Visit | Attending: Nurse Practitioner | Admitting: Nurse Practitioner

## 2016-01-01 DIAGNOSIS — R519 Headache, unspecified: Secondary | ICD-10-CM

## 2016-01-01 DIAGNOSIS — R51 Headache: Principal | ICD-10-CM

## 2016-01-01 DIAGNOSIS — M5481 Occipital neuralgia: Secondary | ICD-10-CM | POA: Diagnosis not present

## 2016-01-01 MED ORDER — DEXAMETHASONE SODIUM PHOSPHATE 4 MG/ML IJ SOLN: 8.0000 mg | Freq: Once | INTRAMUSCULAR | Status: DC

## 2016-01-01 NOTE — Discharge Instructions (Signed)

## 2016-01-02 DIAGNOSIS — D631 Anemia in chronic kidney disease: Secondary | ICD-10-CM | POA: Diagnosis not present

## 2016-01-02 DIAGNOSIS — N186 End stage renal disease: Secondary | ICD-10-CM | POA: Diagnosis not present

## 2016-01-02 DIAGNOSIS — Z992 Dependence on renal dialysis: Secondary | ICD-10-CM | POA: Diagnosis not present

## 2016-01-02 DIAGNOSIS — D509 Iron deficiency anemia, unspecified: Secondary | ICD-10-CM | POA: Diagnosis not present

## 2016-01-02 DIAGNOSIS — N2581 Secondary hyperparathyroidism of renal origin: Secondary | ICD-10-CM | POA: Diagnosis not present

## 2016-01-05 DIAGNOSIS — N2581 Secondary hyperparathyroidism of renal origin: Secondary | ICD-10-CM | POA: Diagnosis not present

## 2016-01-05 DIAGNOSIS — D631 Anemia in chronic kidney disease: Secondary | ICD-10-CM | POA: Diagnosis not present

## 2016-01-05 DIAGNOSIS — Z992 Dependence on renal dialysis: Secondary | ICD-10-CM | POA: Diagnosis not present

## 2016-01-05 DIAGNOSIS — N186 End stage renal disease: Secondary | ICD-10-CM | POA: Diagnosis not present

## 2016-01-05 DIAGNOSIS — D509 Iron deficiency anemia, unspecified: Secondary | ICD-10-CM | POA: Diagnosis not present

## 2016-01-06 ENCOUNTER — Encounter (INDEPENDENT_AMBULATORY_CARE_PROVIDER_SITE_OTHER): Payer: Self-pay | Admitting: Orthopaedic Surgery

## 2016-01-06 ENCOUNTER — Ambulatory Visit (INDEPENDENT_AMBULATORY_CARE_PROVIDER_SITE_OTHER): Payer: Medicare Other | Admitting: Orthopaedic Surgery

## 2016-01-06 VITALS — BP 133/80 | HR 62 | Ht 71.0 in | Wt 245.0 lb

## 2016-01-06 DIAGNOSIS — M1712 Unilateral primary osteoarthritis, left knee: Secondary | ICD-10-CM

## 2016-01-06 MED ORDER — METHYLPREDNISOLONE ACETATE 40 MG/ML IJ SUSP
80.0000 mg | INTRAMUSCULAR | Status: AC | PRN
  Administered 2016-01-06: 80 mg

## 2016-01-06 MED ORDER — LIDOCAINE HCL 1 % IJ SOLN
5.0000 mL | INTRAMUSCULAR | Status: AC | PRN
  Administered 2016-01-06: 5 mL

## 2016-01-06 NOTE — Progress Notes (Signed)
   Procedure Note  Patient: Jesus [name]             Date of Birth: [birth year]           MRN: [id number]             Visit Date: 01/06/2016  Procedures: Visit Diagnoses: Primary osteoarthritis of left knee - Plan: Large Joint Injection/Arthrocentesis  Large Joint Inj Date/Time: 01/06/2016 9:32 AM Performed by: Garald Balding Authorized by: Garald Balding   Consent Given by:  Patient Timeout: prior to procedure the correct patient, procedure, and site was verified   Indications:  Pain and joint swelling Location:  Knee Site:  L knee Prep: patient was prepped and draped in usual sterile fashion   Needle Size:  25 G Needle Length:  1.5 inches Approach:  Anteromedial Ultrasound Guidance: No   Fluoroscopic Guidance: No   Arthrogram: No Medications:  5 mL lidocaine 1 %; 80 mg methylPREDNISolone acetate 40 MG/ML Aspiration Attempted: No   Patient tolerance:  Patient tolerated the procedure well with no immediate complications   Jesus [name] is 34 years AND has been followed at intervals for the osteoarthritis of his left knee. He notes that in the last month he's had recurrent pain without history of injury or trauma. Had a prior knee arthroscopy consistent with osteoarthritis with follow-up films suggesting the same. His last cortisone injection was 1 year ago. He has had prior viscose supplementation without much success.  In February of this year he had nephrectomy and bladder excision for recurrent cancer and is presently on dialysis through [facility].  My plan today is to inject his knee with the cortisone.

## 2016-01-07 DIAGNOSIS — N2581 Secondary hyperparathyroidism of renal origin: Secondary | ICD-10-CM | POA: Diagnosis not present

## 2016-01-07 DIAGNOSIS — D509 Iron deficiency anemia, unspecified: Secondary | ICD-10-CM | POA: Diagnosis not present

## 2016-01-07 DIAGNOSIS — D631 Anemia in chronic kidney disease: Secondary | ICD-10-CM | POA: Diagnosis not present

## 2016-01-07 DIAGNOSIS — N186 End stage renal disease: Secondary | ICD-10-CM | POA: Diagnosis not present

## 2016-01-07 DIAGNOSIS — Z992 Dependence on renal dialysis: Secondary | ICD-10-CM | POA: Diagnosis not present

## 2016-01-09 DIAGNOSIS — D631 Anemia in chronic kidney disease: Secondary | ICD-10-CM | POA: Diagnosis not present

## 2016-01-09 DIAGNOSIS — D509 Iron deficiency anemia, unspecified: Secondary | ICD-10-CM | POA: Diagnosis not present

## 2016-01-09 DIAGNOSIS — N186 End stage renal disease: Secondary | ICD-10-CM | POA: Diagnosis not present

## 2016-01-09 DIAGNOSIS — N2581 Secondary hyperparathyroidism of renal origin: Secondary | ICD-10-CM | POA: Diagnosis not present

## 2016-01-09 DIAGNOSIS — Z992 Dependence on renal dialysis: Secondary | ICD-10-CM | POA: Diagnosis not present

## 2016-01-12 DIAGNOSIS — D509 Iron deficiency anemia, unspecified: Secondary | ICD-10-CM | POA: Diagnosis not present

## 2016-01-12 DIAGNOSIS — Z992 Dependence on renal dialysis: Secondary | ICD-10-CM | POA: Diagnosis not present

## 2016-01-12 DIAGNOSIS — N2581 Secondary hyperparathyroidism of renal origin: Secondary | ICD-10-CM | POA: Diagnosis not present

## 2016-01-12 DIAGNOSIS — N186 End stage renal disease: Secondary | ICD-10-CM | POA: Diagnosis not present

## 2016-01-12 DIAGNOSIS — D631 Anemia in chronic kidney disease: Secondary | ICD-10-CM | POA: Diagnosis not present

## 2016-01-13 ENCOUNTER — Telehealth: Payer: Self-pay

## 2016-01-13 ENCOUNTER — Other Ambulatory Visit: Payer: Self-pay

## 2016-01-13 NOTE — Telephone Encounter (Signed)
-----   Message from Angelia Mould, MD sent at 01/12/2016  6:03 PM EST ----- Regarding: RE: removal of perm cath That's fine. Thanks Gerald Stabs ----- Message ----- From: Denman George, RN Sent: 01/12/2016   3:30 PM To: Angelia Mould, MD Subject: removal of perm cath                           This pt. had his perm cath placed by IR at [facility].  Dr. Lowanda Foster is requesting to have this removed, and the pt. doesn't want to go back to [facility] to have this done.  Is it okay to schedule one of our PA's to remove it?  (You placed his (R) arm B-C AVF in April, and Superficialized it in September.)

## 2016-01-13 NOTE — Telephone Encounter (Signed)
Notified Joy @ [facility] Of plan to remove dialysis catheter 01/18/16 @ 12:00 PM.  Stated she will inform pt. To arrive to Admitting at [facility]. At 11:45 AM.

## 2016-01-14 DIAGNOSIS — Z992 Dependence on renal dialysis: Secondary | ICD-10-CM | POA: Diagnosis not present

## 2016-01-14 DIAGNOSIS — N2581 Secondary hyperparathyroidism of renal origin: Secondary | ICD-10-CM | POA: Diagnosis not present

## 2016-01-14 DIAGNOSIS — N186 End stage renal disease: Secondary | ICD-10-CM | POA: Diagnosis not present

## 2016-01-14 DIAGNOSIS — D631 Anemia in chronic kidney disease: Secondary | ICD-10-CM | POA: Diagnosis not present

## 2016-01-14 DIAGNOSIS — D509 Iron deficiency anemia, unspecified: Secondary | ICD-10-CM | POA: Diagnosis not present

## 2016-01-16 DIAGNOSIS — D509 Iron deficiency anemia, unspecified: Secondary | ICD-10-CM | POA: Diagnosis not present

## 2016-01-16 DIAGNOSIS — D631 Anemia in chronic kidney disease: Secondary | ICD-10-CM | POA: Diagnosis not present

## 2016-01-16 DIAGNOSIS — Z992 Dependence on renal dialysis: Secondary | ICD-10-CM | POA: Diagnosis not present

## 2016-01-16 DIAGNOSIS — N186 End stage renal disease: Secondary | ICD-10-CM | POA: Diagnosis not present

## 2016-01-16 DIAGNOSIS — N2581 Secondary hyperparathyroidism of renal origin: Secondary | ICD-10-CM | POA: Diagnosis not present

## 2016-01-18 ENCOUNTER — Ambulatory Visit (HOSPITAL_COMMUNITY)
Admission: RE | Admit: 2016-01-18 | Discharge: 2016-01-18 | Disposition: A | Discharge status: Home / Self Care | Payer: Medicare Other | Source: Ambulatory Visit | Attending: Vascular Surgery | Admitting: Vascular Surgery

## 2016-01-18 DIAGNOSIS — F419 Anxiety disorder, unspecified: Secondary | ICD-10-CM | POA: Diagnosis not present

## 2016-01-18 DIAGNOSIS — I120 Hypertensive chronic kidney disease with stage 5 chronic kidney disease or end stage renal disease: Secondary | ICD-10-CM | POA: Insufficient documentation

## 2016-01-18 DIAGNOSIS — Z79899 Other long term (current) drug therapy: Secondary | ICD-10-CM | POA: Diagnosis not present

## 2016-01-18 DIAGNOSIS — Z4901 Encounter for fitting and adjustment of extracorporeal dialysis catheter: Secondary | ICD-10-CM | POA: Diagnosis not present

## 2016-01-18 DIAGNOSIS — N186 End stage renal disease: Secondary | ICD-10-CM | POA: Insufficient documentation

## 2016-01-18 DIAGNOSIS — M1990 Unspecified osteoarthritis, unspecified site: Secondary | ICD-10-CM | POA: Insufficient documentation

## 2016-01-18 MED ORDER — LIDOCAINE HCL (PF) 2 % IJ SOLN
INTRAMUSCULAR | Status: AC
  Filled 2016-01-18: qty 10

## 2016-01-18 NOTE — Progress Notes (Signed)
Catheter removal and discharge instructions per PA. Pt denies questions.  Dressing clean dry and intact.

## 2016-01-18 NOTE — Progress Notes (Signed)
[facility] SHORT STAY H&P  CC: ESRD   HPI: Jesus [name] is a 65 y.o. male who has been on HD through  functioning Hemodialysis access in the right upper extremity. They are here for HD catheter removal. Pt. denies signs of steal syndrome.  Past Medical History:  Diagnosis Date  . Anemia   . Anxiety   . Arthritis   . Cancer [facility])    bladder, ureter, Bil kidneys  . Dermatitis    scaly bump occurs every few months, pt uses cortizone cream and it goes away  . ESRD (end stage renal disease) on dialysis ([place])    Bilateral Nephrectomies- due to cancer  . History of blood transfusion   . Hypertension   . Lumbar back pain   . Mental disorder   . Pleural effusion, right    s/p right thoracentesis 10/07/15 (no malignancy by cytology report)  . Shortness of breath dyspnea    Lying down gets short of breath    Family Hx No family history on file.  Social HX Social History  Substance Use Topics  . Smoking status: Never Smoker  . Smokeless tobacco: Never Used  . Alcohol use No    Allergies Allergies  Allergen Reactions  . No Known Allergies     Medications Current Outpatient Prescriptions  Medication Sig Dispense Refill  . ALPRAZolam (XANAX) 1 MG tablet Take 1 mg by mouth 3 (three) times daily as needed for anxiety.     Marland Kitchen amLODipine (NORVASC) 2.5 MG tablet Take 1 tablet by mouth daily.    . Calcium-Vitamin D 600-200 MG-UNIT tablet Take by mouth.    . fluticasone (FLONASE) 50 MCG/ACT nasal spray     . metoprolol succinate (TOPROL-XL) 50 MG 24 hr tablet Take 75 mg by mouth every evening.    Marland Kitchen oxyCODONE-acetaminophen (ROXICET) 5-325 MG tablet Take 1-2 tablets by mouth every 4 (four) hours as needed. (Patient taking differently: Take 1-2 tablets by mouth every 4 (four) hours as needed for moderate pain. ) 30 tablet 0  . Polyethyl Glycol-Propyl Glycol (SYSTANE OP) Place 1 drop into both eyes daily as needed (for dry, itchy eyes).    . sevelamer carbonate (RENVELA)  800 MG tablet Take 1,600 mg by mouth 3 (three) times daily with meals.     Current Facility-Administered Medications  Medication Dose Route Frequency Provider Last Rate Last Dose  . lidocaine (XYLOCAINE) 2 % injection             Labs COAG Lab Results  Component Value Date   INR 0.98 02/19/2013   INR 0.95 01/18/2012   No results found for: PTT  PHYSICAL EXAM  Vitals:   01/18/16 1147  BP: (!) 179/76  Pulse: 60  Resp: 20  Temp: 98.2 F (36.8 C)    General:  WDWN in NAD HENT: WNL Eyes: Pupils equal Pulmonary: normal non-labored breathing  Cardiac: RRR, Skin: normal, no cyanosis, jaundice, pallor or bruising Vascular Exam/Pulses: 2+  pulses in RIGHT upper extremity. Extremities without ischemic changes, no Gangrene , no cellulitis; no open wounds;   There is a good thrill and good bruit in the right UE. Hand grip is 5/5 and sensation in digits is intact;   Impression: This is a 65 y.o. male who has a functioning HD access.  Plan: Removal of Right IJ HD catheter Theda Sers, Genavive Kubicki [facility] 01/18/2016 12:29 PM  [facility] Catheter Removal Procedure Note  Diagnosis: ESRD with Functioning AVF/AVGG  Plan:  Remove right diatek catheter  Consent signed:  yes Time out completed:  yes Coumadin:  No. PT/INR (if applicable):   Other labs:   Procedure: 1.  Sterile prepping and draping over catheter area 2. 5 ml 2% lidocaine plain instilled at removal site. 3.  right catheter removed in its entirety with cuff in tact. 4.  Complications: none  5. Tip of catheter sent for culture:  no   Patient tolerated procedure well:  yes Pressure held, no bleeding noted, dressing applied Instructions given to the pt regarding wound care and bleeding.  OtherLaurence Slate [facility] 01/18/2016 12:30 PM

## 2016-01-19 DIAGNOSIS — N2581 Secondary hyperparathyroidism of renal origin: Secondary | ICD-10-CM | POA: Diagnosis not present

## 2016-01-19 DIAGNOSIS — D631 Anemia in chronic kidney disease: Secondary | ICD-10-CM | POA: Diagnosis not present

## 2016-01-19 DIAGNOSIS — D509 Iron deficiency anemia, unspecified: Secondary | ICD-10-CM | POA: Diagnosis not present

## 2016-01-19 DIAGNOSIS — N186 End stage renal disease: Secondary | ICD-10-CM | POA: Diagnosis not present

## 2016-01-19 DIAGNOSIS — Z992 Dependence on renal dialysis: Secondary | ICD-10-CM | POA: Diagnosis not present

## 2016-01-21 DIAGNOSIS — Z992 Dependence on renal dialysis: Secondary | ICD-10-CM | POA: Diagnosis not present

## 2016-01-21 DIAGNOSIS — D631 Anemia in chronic kidney disease: Secondary | ICD-10-CM | POA: Diagnosis not present

## 2016-01-21 DIAGNOSIS — D509 Iron deficiency anemia, unspecified: Secondary | ICD-10-CM | POA: Diagnosis not present

## 2016-01-21 DIAGNOSIS — N186 End stage renal disease: Secondary | ICD-10-CM | POA: Diagnosis not present

## 2016-01-21 DIAGNOSIS — N2581 Secondary hyperparathyroidism of renal origin: Secondary | ICD-10-CM | POA: Diagnosis not present

## 2016-01-23 DIAGNOSIS — N2581 Secondary hyperparathyroidism of renal origin: Secondary | ICD-10-CM | POA: Diagnosis not present

## 2016-01-23 DIAGNOSIS — N186 End stage renal disease: Secondary | ICD-10-CM | POA: Diagnosis not present

## 2016-01-23 DIAGNOSIS — Z992 Dependence on renal dialysis: Secondary | ICD-10-CM | POA: Diagnosis not present

## 2016-01-23 DIAGNOSIS — D631 Anemia in chronic kidney disease: Secondary | ICD-10-CM | POA: Diagnosis not present

## 2016-01-23 DIAGNOSIS — D509 Iron deficiency anemia, unspecified: Secondary | ICD-10-CM | POA: Diagnosis not present

## 2016-01-26 DIAGNOSIS — Z992 Dependence on renal dialysis: Secondary | ICD-10-CM | POA: Diagnosis not present

## 2016-01-26 DIAGNOSIS — D509 Iron deficiency anemia, unspecified: Secondary | ICD-10-CM | POA: Diagnosis not present

## 2016-01-26 DIAGNOSIS — N2581 Secondary hyperparathyroidism of renal origin: Secondary | ICD-10-CM | POA: Diagnosis not present

## 2016-01-26 DIAGNOSIS — D631 Anemia in chronic kidney disease: Secondary | ICD-10-CM | POA: Diagnosis not present

## 2016-01-26 DIAGNOSIS — N186 End stage renal disease: Secondary | ICD-10-CM | POA: Diagnosis not present

## 2016-01-28 DIAGNOSIS — D631 Anemia in chronic kidney disease: Secondary | ICD-10-CM | POA: Diagnosis not present

## 2016-01-28 DIAGNOSIS — Z992 Dependence on renal dialysis: Secondary | ICD-10-CM | POA: Diagnosis not present

## 2016-01-28 DIAGNOSIS — N186 End stage renal disease: Secondary | ICD-10-CM | POA: Diagnosis not present

## 2016-01-28 DIAGNOSIS — N2581 Secondary hyperparathyroidism of renal origin: Secondary | ICD-10-CM | POA: Diagnosis not present

## 2016-01-28 DIAGNOSIS — D509 Iron deficiency anemia, unspecified: Secondary | ICD-10-CM | POA: Diagnosis not present

## 2016-01-30 DIAGNOSIS — D509 Iron deficiency anemia, unspecified: Secondary | ICD-10-CM | POA: Diagnosis not present

## 2016-01-30 DIAGNOSIS — N2581 Secondary hyperparathyroidism of renal origin: Secondary | ICD-10-CM | POA: Diagnosis not present

## 2016-01-30 DIAGNOSIS — N186 End stage renal disease: Secondary | ICD-10-CM | POA: Diagnosis not present

## 2016-01-30 DIAGNOSIS — Z992 Dependence on renal dialysis: Secondary | ICD-10-CM | POA: Diagnosis not present

## 2016-01-30 DIAGNOSIS — D631 Anemia in chronic kidney disease: Secondary | ICD-10-CM | POA: Diagnosis not present

## 2016-02-01 DIAGNOSIS — C651 Malignant neoplasm of right renal pelvis: Secondary | ICD-10-CM | POA: Diagnosis not present

## 2016-02-01 DIAGNOSIS — Z992 Dependence on renal dialysis: Secondary | ICD-10-CM | POA: Diagnosis not present

## 2016-02-01 DIAGNOSIS — D649 Anemia, unspecified: Secondary | ICD-10-CM | POA: Diagnosis not present

## 2016-02-01 DIAGNOSIS — C661 Malignant neoplasm of right ureter: Secondary | ICD-10-CM | POA: Diagnosis not present

## 2016-02-01 DIAGNOSIS — J90 Pleural effusion, not elsewhere classified: Secondary | ICD-10-CM | POA: Diagnosis not present

## 2016-02-01 DIAGNOSIS — Z905 Acquired absence of kidney: Secondary | ICD-10-CM | POA: Diagnosis not present

## 2016-02-02 DIAGNOSIS — N2581 Secondary hyperparathyroidism of renal origin: Secondary | ICD-10-CM | POA: Diagnosis not present

## 2016-02-02 DIAGNOSIS — Z992 Dependence on renal dialysis: Secondary | ICD-10-CM | POA: Diagnosis not present

## 2016-02-02 DIAGNOSIS — N186 End stage renal disease: Secondary | ICD-10-CM | POA: Diagnosis not present

## 2016-02-02 DIAGNOSIS — D631 Anemia in chronic kidney disease: Secondary | ICD-10-CM | POA: Diagnosis not present

## 2016-02-02 DIAGNOSIS — D509 Iron deficiency anemia, unspecified: Secondary | ICD-10-CM | POA: Diagnosis not present

## 2016-02-04 DIAGNOSIS — D631 Anemia in chronic kidney disease: Secondary | ICD-10-CM | POA: Diagnosis not present

## 2016-02-04 DIAGNOSIS — Z992 Dependence on renal dialysis: Secondary | ICD-10-CM | POA: Diagnosis not present

## 2016-02-04 DIAGNOSIS — N2581 Secondary hyperparathyroidism of renal origin: Secondary | ICD-10-CM | POA: Diagnosis not present

## 2016-02-04 DIAGNOSIS — N186 End stage renal disease: Secondary | ICD-10-CM | POA: Diagnosis not present

## 2016-02-04 DIAGNOSIS — D509 Iron deficiency anemia, unspecified: Secondary | ICD-10-CM | POA: Diagnosis not present

## 2016-02-06 DIAGNOSIS — D509 Iron deficiency anemia, unspecified: Secondary | ICD-10-CM | POA: Diagnosis not present

## 2016-02-06 DIAGNOSIS — N186 End stage renal disease: Secondary | ICD-10-CM | POA: Diagnosis not present

## 2016-02-06 DIAGNOSIS — Z992 Dependence on renal dialysis: Secondary | ICD-10-CM | POA: Diagnosis not present

## 2016-02-06 DIAGNOSIS — N2581 Secondary hyperparathyroidism of renal origin: Secondary | ICD-10-CM | POA: Diagnosis not present

## 2016-02-06 DIAGNOSIS — D631 Anemia in chronic kidney disease: Secondary | ICD-10-CM | POA: Diagnosis not present

## 2016-02-09 DIAGNOSIS — Z992 Dependence on renal dialysis: Secondary | ICD-10-CM | POA: Diagnosis not present

## 2016-02-09 DIAGNOSIS — N2581 Secondary hyperparathyroidism of renal origin: Secondary | ICD-10-CM | POA: Diagnosis not present

## 2016-02-09 DIAGNOSIS — D509 Iron deficiency anemia, unspecified: Secondary | ICD-10-CM | POA: Diagnosis not present

## 2016-02-09 DIAGNOSIS — N186 End stage renal disease: Secondary | ICD-10-CM | POA: Diagnosis not present

## 2016-02-09 DIAGNOSIS — D631 Anemia in chronic kidney disease: Secondary | ICD-10-CM | POA: Diagnosis not present

## 2016-02-11 DIAGNOSIS — N186 End stage renal disease: Secondary | ICD-10-CM | POA: Diagnosis not present

## 2016-02-11 DIAGNOSIS — D509 Iron deficiency anemia, unspecified: Secondary | ICD-10-CM | POA: Diagnosis not present

## 2016-02-11 DIAGNOSIS — N2581 Secondary hyperparathyroidism of renal origin: Secondary | ICD-10-CM | POA: Diagnosis not present

## 2016-02-11 DIAGNOSIS — Z992 Dependence on renal dialysis: Secondary | ICD-10-CM | POA: Diagnosis not present

## 2016-02-11 DIAGNOSIS — D631 Anemia in chronic kidney disease: Secondary | ICD-10-CM | POA: Diagnosis not present

## 2016-02-13 DIAGNOSIS — D509 Iron deficiency anemia, unspecified: Secondary | ICD-10-CM | POA: Diagnosis not present

## 2016-02-13 DIAGNOSIS — D631 Anemia in chronic kidney disease: Secondary | ICD-10-CM | POA: Diagnosis not present

## 2016-02-13 DIAGNOSIS — N186 End stage renal disease: Secondary | ICD-10-CM | POA: Diagnosis not present

## 2016-02-13 DIAGNOSIS — Z992 Dependence on renal dialysis: Secondary | ICD-10-CM | POA: Diagnosis not present

## 2016-02-13 DIAGNOSIS — N2581 Secondary hyperparathyroidism of renal origin: Secondary | ICD-10-CM | POA: Diagnosis not present

## 2016-02-16 DIAGNOSIS — Z992 Dependence on renal dialysis: Secondary | ICD-10-CM | POA: Diagnosis not present

## 2016-02-16 DIAGNOSIS — D509 Iron deficiency anemia, unspecified: Secondary | ICD-10-CM | POA: Diagnosis not present

## 2016-02-16 DIAGNOSIS — N186 End stage renal disease: Secondary | ICD-10-CM | POA: Diagnosis not present

## 2016-02-16 DIAGNOSIS — N2581 Secondary hyperparathyroidism of renal origin: Secondary | ICD-10-CM | POA: Diagnosis not present

## 2016-02-16 DIAGNOSIS — D631 Anemia in chronic kidney disease: Secondary | ICD-10-CM | POA: Diagnosis not present

## 2016-02-18 DIAGNOSIS — D631 Anemia in chronic kidney disease: Secondary | ICD-10-CM | POA: Diagnosis not present

## 2016-02-18 DIAGNOSIS — D509 Iron deficiency anemia, unspecified: Secondary | ICD-10-CM | POA: Diagnosis not present

## 2016-02-18 DIAGNOSIS — N2581 Secondary hyperparathyroidism of renal origin: Secondary | ICD-10-CM | POA: Diagnosis not present

## 2016-02-18 DIAGNOSIS — Z992 Dependence on renal dialysis: Secondary | ICD-10-CM | POA: Diagnosis not present

## 2016-02-18 DIAGNOSIS — N186 End stage renal disease: Secondary | ICD-10-CM | POA: Diagnosis not present

## 2016-02-20 DIAGNOSIS — Z992 Dependence on renal dialysis: Secondary | ICD-10-CM | POA: Diagnosis not present

## 2016-02-20 DIAGNOSIS — N186 End stage renal disease: Secondary | ICD-10-CM | POA: Diagnosis not present

## 2016-02-20 DIAGNOSIS — D631 Anemia in chronic kidney disease: Secondary | ICD-10-CM | POA: Diagnosis not present

## 2016-02-20 DIAGNOSIS — N2581 Secondary hyperparathyroidism of renal origin: Secondary | ICD-10-CM | POA: Diagnosis not present

## 2016-02-20 DIAGNOSIS — D509 Iron deficiency anemia, unspecified: Secondary | ICD-10-CM | POA: Diagnosis not present

## 2016-02-23 DIAGNOSIS — N2581 Secondary hyperparathyroidism of renal origin: Secondary | ICD-10-CM | POA: Diagnosis not present

## 2016-02-23 DIAGNOSIS — N186 End stage renal disease: Secondary | ICD-10-CM | POA: Diagnosis not present

## 2016-02-23 DIAGNOSIS — D631 Anemia in chronic kidney disease: Secondary | ICD-10-CM | POA: Diagnosis not present

## 2016-02-23 DIAGNOSIS — Z992 Dependence on renal dialysis: Secondary | ICD-10-CM | POA: Diagnosis not present

## 2016-02-23 DIAGNOSIS — D509 Iron deficiency anemia, unspecified: Secondary | ICD-10-CM | POA: Diagnosis not present

## 2016-02-25 DIAGNOSIS — D631 Anemia in chronic kidney disease: Secondary | ICD-10-CM | POA: Diagnosis not present

## 2016-02-25 DIAGNOSIS — N2581 Secondary hyperparathyroidism of renal origin: Secondary | ICD-10-CM | POA: Diagnosis not present

## 2016-02-25 DIAGNOSIS — N186 End stage renal disease: Secondary | ICD-10-CM | POA: Diagnosis not present

## 2016-02-25 DIAGNOSIS — D509 Iron deficiency anemia, unspecified: Secondary | ICD-10-CM | POA: Diagnosis not present

## 2016-02-25 DIAGNOSIS — Z992 Dependence on renal dialysis: Secondary | ICD-10-CM | POA: Diagnosis not present

## 2016-02-27 DIAGNOSIS — D509 Iron deficiency anemia, unspecified: Secondary | ICD-10-CM | POA: Diagnosis not present

## 2016-02-27 DIAGNOSIS — D631 Anemia in chronic kidney disease: Secondary | ICD-10-CM | POA: Diagnosis not present

## 2016-02-27 DIAGNOSIS — N186 End stage renal disease: Secondary | ICD-10-CM | POA: Diagnosis not present

## 2016-02-27 DIAGNOSIS — Z992 Dependence on renal dialysis: Secondary | ICD-10-CM | POA: Diagnosis not present

## 2016-02-27 DIAGNOSIS — N2581 Secondary hyperparathyroidism of renal origin: Secondary | ICD-10-CM | POA: Diagnosis not present

## 2016-02-28 DIAGNOSIS — Z992 Dependence on renal dialysis: Secondary | ICD-10-CM | POA: Diagnosis not present

## 2016-02-28 DIAGNOSIS — N186 End stage renal disease: Secondary | ICD-10-CM | POA: Diagnosis not present

## 2016-03-01 DIAGNOSIS — N186 End stage renal disease: Secondary | ICD-10-CM | POA: Diagnosis not present

## 2016-03-01 DIAGNOSIS — D509 Iron deficiency anemia, unspecified: Secondary | ICD-10-CM | POA: Diagnosis not present

## 2016-03-01 DIAGNOSIS — Z992 Dependence on renal dialysis: Secondary | ICD-10-CM | POA: Diagnosis not present

## 2016-03-01 DIAGNOSIS — D631 Anemia in chronic kidney disease: Secondary | ICD-10-CM | POA: Diagnosis not present

## 2016-03-01 DIAGNOSIS — N2581 Secondary hyperparathyroidism of renal origin: Secondary | ICD-10-CM | POA: Diagnosis not present

## 2016-03-02 ENCOUNTER — Other Ambulatory Visit: Payer: Self-pay | Admitting: Nurse Practitioner

## 2016-03-02 DIAGNOSIS — R51 Headache: Principal | ICD-10-CM

## 2016-03-02 DIAGNOSIS — M5090 Cervical disc disorder, unspecified, unspecified cervical region: Secondary | ICD-10-CM | POA: Diagnosis not present

## 2016-03-02 DIAGNOSIS — M47816 Spondylosis without myelopathy or radiculopathy, lumbar region: Secondary | ICD-10-CM | POA: Diagnosis not present

## 2016-03-02 DIAGNOSIS — R519 Headache, unspecified: Secondary | ICD-10-CM

## 2016-03-03 DIAGNOSIS — D509 Iron deficiency anemia, unspecified: Secondary | ICD-10-CM | POA: Diagnosis not present

## 2016-03-03 DIAGNOSIS — Z992 Dependence on renal dialysis: Secondary | ICD-10-CM | POA: Diagnosis not present

## 2016-03-03 DIAGNOSIS — N186 End stage renal disease: Secondary | ICD-10-CM | POA: Diagnosis not present

## 2016-03-03 DIAGNOSIS — D631 Anemia in chronic kidney disease: Secondary | ICD-10-CM | POA: Diagnosis not present

## 2016-03-03 DIAGNOSIS — N2581 Secondary hyperparathyroidism of renal origin: Secondary | ICD-10-CM | POA: Diagnosis not present

## 2016-03-05 DIAGNOSIS — N186 End stage renal disease: Secondary | ICD-10-CM | POA: Diagnosis not present

## 2016-03-05 DIAGNOSIS — Z992 Dependence on renal dialysis: Secondary | ICD-10-CM | POA: Diagnosis not present

## 2016-03-05 DIAGNOSIS — N2581 Secondary hyperparathyroidism of renal origin: Secondary | ICD-10-CM | POA: Diagnosis not present

## 2016-03-05 DIAGNOSIS — D631 Anemia in chronic kidney disease: Secondary | ICD-10-CM | POA: Diagnosis not present

## 2016-03-05 DIAGNOSIS — D509 Iron deficiency anemia, unspecified: Secondary | ICD-10-CM | POA: Diagnosis not present

## 2016-03-08 DIAGNOSIS — D631 Anemia in chronic kidney disease: Secondary | ICD-10-CM | POA: Diagnosis not present

## 2016-03-08 DIAGNOSIS — D509 Iron deficiency anemia, unspecified: Secondary | ICD-10-CM | POA: Diagnosis not present

## 2016-03-08 DIAGNOSIS — N186 End stage renal disease: Secondary | ICD-10-CM | POA: Diagnosis not present

## 2016-03-08 DIAGNOSIS — N2581 Secondary hyperparathyroidism of renal origin: Secondary | ICD-10-CM | POA: Diagnosis not present

## 2016-03-08 DIAGNOSIS — Z992 Dependence on renal dialysis: Secondary | ICD-10-CM | POA: Diagnosis not present

## 2016-03-10 DIAGNOSIS — D631 Anemia in chronic kidney disease: Secondary | ICD-10-CM | POA: Diagnosis not present

## 2016-03-10 DIAGNOSIS — D509 Iron deficiency anemia, unspecified: Secondary | ICD-10-CM | POA: Diagnosis not present

## 2016-03-10 DIAGNOSIS — N2581 Secondary hyperparathyroidism of renal origin: Secondary | ICD-10-CM | POA: Diagnosis not present

## 2016-03-10 DIAGNOSIS — Z992 Dependence on renal dialysis: Secondary | ICD-10-CM | POA: Diagnosis not present

## 2016-03-10 DIAGNOSIS — N186 End stage renal disease: Secondary | ICD-10-CM | POA: Diagnosis not present

## 2016-03-12 DIAGNOSIS — N2581 Secondary hyperparathyroidism of renal origin: Secondary | ICD-10-CM | POA: Diagnosis not present

## 2016-03-12 DIAGNOSIS — D631 Anemia in chronic kidney disease: Secondary | ICD-10-CM | POA: Diagnosis not present

## 2016-03-12 DIAGNOSIS — D509 Iron deficiency anemia, unspecified: Secondary | ICD-10-CM | POA: Diagnosis not present

## 2016-03-12 DIAGNOSIS — Z992 Dependence on renal dialysis: Secondary | ICD-10-CM | POA: Diagnosis not present

## 2016-03-12 DIAGNOSIS — N186 End stage renal disease: Secondary | ICD-10-CM | POA: Diagnosis not present

## 2016-03-15 DIAGNOSIS — D509 Iron deficiency anemia, unspecified: Secondary | ICD-10-CM | POA: Diagnosis not present

## 2016-03-15 DIAGNOSIS — N186 End stage renal disease: Secondary | ICD-10-CM | POA: Diagnosis not present

## 2016-03-15 DIAGNOSIS — D631 Anemia in chronic kidney disease: Secondary | ICD-10-CM | POA: Diagnosis not present

## 2016-03-15 DIAGNOSIS — N2581 Secondary hyperparathyroidism of renal origin: Secondary | ICD-10-CM | POA: Diagnosis not present

## 2016-03-15 DIAGNOSIS — Z992 Dependence on renal dialysis: Secondary | ICD-10-CM | POA: Diagnosis not present

## 2016-03-17 DIAGNOSIS — D509 Iron deficiency anemia, unspecified: Secondary | ICD-10-CM | POA: Diagnosis not present

## 2016-03-17 DIAGNOSIS — N2581 Secondary hyperparathyroidism of renal origin: Secondary | ICD-10-CM | POA: Diagnosis not present

## 2016-03-17 DIAGNOSIS — D631 Anemia in chronic kidney disease: Secondary | ICD-10-CM | POA: Diagnosis not present

## 2016-03-17 DIAGNOSIS — Z992 Dependence on renal dialysis: Secondary | ICD-10-CM | POA: Diagnosis not present

## 2016-03-17 DIAGNOSIS — N186 End stage renal disease: Secondary | ICD-10-CM | POA: Diagnosis not present

## 2016-03-18 ENCOUNTER — Other Ambulatory Visit: Payer: Self-pay | Admitting: Nurse Practitioner

## 2016-03-18 ENCOUNTER — Other Ambulatory Visit: Payer: Medicare Other

## 2016-03-18 ENCOUNTER — Ambulatory Visit
Admission: RE | Admit: 2016-03-18 | Discharge: 2016-03-18 | Disposition: A | Discharge status: Home / Self Care | Payer: Medicare Other | Source: Ambulatory Visit | Attending: Nurse Practitioner | Admitting: Nurse Practitioner

## 2016-03-18 DIAGNOSIS — R51 Headache: Principal | ICD-10-CM

## 2016-03-18 DIAGNOSIS — R519 Headache, unspecified: Secondary | ICD-10-CM

## 2016-03-18 DIAGNOSIS — M5481 Occipital neuralgia: Secondary | ICD-10-CM | POA: Diagnosis not present

## 2016-03-18 MED ORDER — DEXAMETHASONE SODIUM PHOSPHATE 4 MG/ML IJ SOLN
8.0000 mg | Freq: Once | INTRAMUSCULAR | Status: AC
  Administered 2016-03-18: 8 mg

## 2016-03-18 NOTE — Discharge Instructions (Signed)

## 2016-03-19 DIAGNOSIS — N186 End stage renal disease: Secondary | ICD-10-CM | POA: Diagnosis not present

## 2016-03-19 DIAGNOSIS — D631 Anemia in chronic kidney disease: Secondary | ICD-10-CM | POA: Diagnosis not present

## 2016-03-19 DIAGNOSIS — Z992 Dependence on renal dialysis: Secondary | ICD-10-CM | POA: Diagnosis not present

## 2016-03-19 DIAGNOSIS — D509 Iron deficiency anemia, unspecified: Secondary | ICD-10-CM | POA: Diagnosis not present

## 2016-03-19 DIAGNOSIS — N2581 Secondary hyperparathyroidism of renal origin: Secondary | ICD-10-CM | POA: Diagnosis not present

## 2016-03-22 DIAGNOSIS — Z992 Dependence on renal dialysis: Secondary | ICD-10-CM | POA: Diagnosis not present

## 2016-03-22 DIAGNOSIS — N2581 Secondary hyperparathyroidism of renal origin: Secondary | ICD-10-CM | POA: Diagnosis not present

## 2016-03-22 DIAGNOSIS — D509 Iron deficiency anemia, unspecified: Secondary | ICD-10-CM | POA: Diagnosis not present

## 2016-03-22 DIAGNOSIS — N186 End stage renal disease: Secondary | ICD-10-CM | POA: Diagnosis not present

## 2016-03-22 DIAGNOSIS — D631 Anemia in chronic kidney disease: Secondary | ICD-10-CM | POA: Diagnosis not present

## 2016-03-24 DIAGNOSIS — D631 Anemia in chronic kidney disease: Secondary | ICD-10-CM | POA: Diagnosis not present

## 2016-03-24 DIAGNOSIS — D509 Iron deficiency anemia, unspecified: Secondary | ICD-10-CM | POA: Diagnosis not present

## 2016-03-24 DIAGNOSIS — N186 End stage renal disease: Secondary | ICD-10-CM | POA: Diagnosis not present

## 2016-03-24 DIAGNOSIS — N2581 Secondary hyperparathyroidism of renal origin: Secondary | ICD-10-CM | POA: Diagnosis not present

## 2016-03-24 DIAGNOSIS — Z992 Dependence on renal dialysis: Secondary | ICD-10-CM | POA: Diagnosis not present

## 2016-03-26 DIAGNOSIS — N186 End stage renal disease: Secondary | ICD-10-CM | POA: Diagnosis not present

## 2016-03-26 DIAGNOSIS — N2581 Secondary hyperparathyroidism of renal origin: Secondary | ICD-10-CM | POA: Diagnosis not present

## 2016-03-26 DIAGNOSIS — D631 Anemia in chronic kidney disease: Secondary | ICD-10-CM | POA: Diagnosis not present

## 2016-03-26 DIAGNOSIS — Z992 Dependence on renal dialysis: Secondary | ICD-10-CM | POA: Diagnosis not present

## 2016-03-26 DIAGNOSIS — D509 Iron deficiency anemia, unspecified: Secondary | ICD-10-CM | POA: Diagnosis not present

## 2016-03-29 DIAGNOSIS — D509 Iron deficiency anemia, unspecified: Secondary | ICD-10-CM | POA: Diagnosis not present

## 2016-03-29 DIAGNOSIS — D631 Anemia in chronic kidney disease: Secondary | ICD-10-CM | POA: Diagnosis not present

## 2016-03-29 DIAGNOSIS — Z992 Dependence on renal dialysis: Secondary | ICD-10-CM | POA: Diagnosis not present

## 2016-03-29 DIAGNOSIS — N186 End stage renal disease: Secondary | ICD-10-CM | POA: Diagnosis not present

## 2016-03-29 DIAGNOSIS — N2581 Secondary hyperparathyroidism of renal origin: Secondary | ICD-10-CM | POA: Diagnosis not present

## 2016-03-30 DIAGNOSIS — D509 Iron deficiency anemia, unspecified: Secondary | ICD-10-CM | POA: Diagnosis not present

## 2016-03-30 DIAGNOSIS — D631 Anemia in chronic kidney disease: Secondary | ICD-10-CM | POA: Diagnosis not present

## 2016-03-30 DIAGNOSIS — N186 End stage renal disease: Secondary | ICD-10-CM | POA: Diagnosis not present

## 2016-03-30 DIAGNOSIS — N2581 Secondary hyperparathyroidism of renal origin: Secondary | ICD-10-CM | POA: Diagnosis not present

## 2016-03-30 DIAGNOSIS — Z992 Dependence on renal dialysis: Secondary | ICD-10-CM | POA: Diagnosis not present

## 2016-03-31 DIAGNOSIS — C689 Malignant neoplasm of urinary organ, unspecified: Secondary | ICD-10-CM | POA: Insufficient documentation

## 2016-03-31 DIAGNOSIS — J90 Pleural effusion, not elsewhere classified: Secondary | ICD-10-CM | POA: Diagnosis not present

## 2016-03-31 DIAGNOSIS — Z992 Dependence on renal dialysis: Secondary | ICD-10-CM | POA: Diagnosis not present

## 2016-04-02 DIAGNOSIS — D631 Anemia in chronic kidney disease: Secondary | ICD-10-CM | POA: Diagnosis not present

## 2016-04-02 DIAGNOSIS — N2581 Secondary hyperparathyroidism of renal origin: Secondary | ICD-10-CM | POA: Diagnosis not present

## 2016-04-02 DIAGNOSIS — D509 Iron deficiency anemia, unspecified: Secondary | ICD-10-CM | POA: Diagnosis not present

## 2016-04-02 DIAGNOSIS — Z992 Dependence on renal dialysis: Secondary | ICD-10-CM | POA: Diagnosis not present

## 2016-04-02 DIAGNOSIS — N186 End stage renal disease: Secondary | ICD-10-CM | POA: Diagnosis not present

## 2016-04-05 DIAGNOSIS — D631 Anemia in chronic kidney disease: Secondary | ICD-10-CM | POA: Diagnosis not present

## 2016-04-05 DIAGNOSIS — D509 Iron deficiency anemia, unspecified: Secondary | ICD-10-CM | POA: Diagnosis not present

## 2016-04-05 DIAGNOSIS — N2581 Secondary hyperparathyroidism of renal origin: Secondary | ICD-10-CM | POA: Diagnosis not present

## 2016-04-05 DIAGNOSIS — N186 End stage renal disease: Secondary | ICD-10-CM | POA: Diagnosis not present

## 2016-04-05 DIAGNOSIS — Z992 Dependence on renal dialysis: Secondary | ICD-10-CM | POA: Diagnosis not present

## 2016-04-07 DIAGNOSIS — C669 Malignant neoplasm of unspecified ureter: Secondary | ICD-10-CM | POA: Diagnosis not present

## 2016-04-07 DIAGNOSIS — C659 Malignant neoplasm of unspecified renal pelvis: Secondary | ICD-10-CM | POA: Diagnosis not present

## 2016-04-07 DIAGNOSIS — J90 Pleural effusion, not elsewhere classified: Secondary | ICD-10-CM | POA: Diagnosis not present

## 2016-04-08 ENCOUNTER — Telehealth: Payer: Self-pay

## 2016-04-08 DIAGNOSIS — N186 End stage renal disease: Secondary | ICD-10-CM | POA: Diagnosis not present

## 2016-04-08 DIAGNOSIS — Z992 Dependence on renal dialysis: Secondary | ICD-10-CM | POA: Diagnosis not present

## 2016-04-08 DIAGNOSIS — D631 Anemia in chronic kidney disease: Secondary | ICD-10-CM | POA: Diagnosis not present

## 2016-04-08 DIAGNOSIS — D509 Iron deficiency anemia, unspecified: Secondary | ICD-10-CM | POA: Diagnosis not present

## 2016-04-08 DIAGNOSIS — N2581 Secondary hyperparathyroidism of renal origin: Secondary | ICD-10-CM | POA: Diagnosis not present

## 2016-04-08 NOTE — Telephone Encounter (Signed)
rec'd call from nurse @ [facility].  Reported the pt. Has a whistling sound in right arm AVF.  Voiced concern of possible early stenosis of fistula.  Asking about office appt. for evaluation vs Fistulogram.  Discussed with Dr. Donzetta Matters.  Advised to schedule appt. for right arm access duplex and office visit, to further evaluate.  Notified Joy, at the [facility] of plan.  Will call back with appt. information.  Agreed.

## 2016-04-09 DIAGNOSIS — D631 Anemia in chronic kidney disease: Secondary | ICD-10-CM | POA: Diagnosis not present

## 2016-04-09 DIAGNOSIS — N2581 Secondary hyperparathyroidism of renal origin: Secondary | ICD-10-CM | POA: Diagnosis not present

## 2016-04-09 DIAGNOSIS — N186 End stage renal disease: Secondary | ICD-10-CM | POA: Diagnosis not present

## 2016-04-09 DIAGNOSIS — D509 Iron deficiency anemia, unspecified: Secondary | ICD-10-CM | POA: Diagnosis not present

## 2016-04-09 DIAGNOSIS — Z992 Dependence on renal dialysis: Secondary | ICD-10-CM | POA: Diagnosis not present

## 2016-04-12 DIAGNOSIS — D631 Anemia in chronic kidney disease: Secondary | ICD-10-CM | POA: Diagnosis not present

## 2016-04-12 DIAGNOSIS — Z992 Dependence on renal dialysis: Secondary | ICD-10-CM | POA: Diagnosis not present

## 2016-04-12 DIAGNOSIS — N2581 Secondary hyperparathyroidism of renal origin: Secondary | ICD-10-CM | POA: Diagnosis not present

## 2016-04-12 DIAGNOSIS — N186 End stage renal disease: Secondary | ICD-10-CM | POA: Diagnosis not present

## 2016-04-12 DIAGNOSIS — D509 Iron deficiency anemia, unspecified: Secondary | ICD-10-CM | POA: Diagnosis not present

## 2016-04-14 DIAGNOSIS — N186 End stage renal disease: Secondary | ICD-10-CM | POA: Diagnosis not present

## 2016-04-14 DIAGNOSIS — Z992 Dependence on renal dialysis: Secondary | ICD-10-CM | POA: Diagnosis not present

## 2016-04-14 DIAGNOSIS — D509 Iron deficiency anemia, unspecified: Secondary | ICD-10-CM | POA: Diagnosis not present

## 2016-04-14 DIAGNOSIS — D631 Anemia in chronic kidney disease: Secondary | ICD-10-CM | POA: Diagnosis not present

## 2016-04-14 DIAGNOSIS — N2581 Secondary hyperparathyroidism of renal origin: Secondary | ICD-10-CM | POA: Diagnosis not present

## 2016-04-16 DIAGNOSIS — Z992 Dependence on renal dialysis: Secondary | ICD-10-CM | POA: Diagnosis not present

## 2016-04-16 DIAGNOSIS — D631 Anemia in chronic kidney disease: Secondary | ICD-10-CM | POA: Diagnosis not present

## 2016-04-16 DIAGNOSIS — D509 Iron deficiency anemia, unspecified: Secondary | ICD-10-CM | POA: Diagnosis not present

## 2016-04-16 DIAGNOSIS — N186 End stage renal disease: Secondary | ICD-10-CM | POA: Diagnosis not present

## 2016-04-16 DIAGNOSIS — N2581 Secondary hyperparathyroidism of renal origin: Secondary | ICD-10-CM | POA: Diagnosis not present

## 2016-04-19 ENCOUNTER — Encounter: Payer: Self-pay | Admitting: Vascular Surgery

## 2016-04-19 DIAGNOSIS — D509 Iron deficiency anemia, unspecified: Secondary | ICD-10-CM | POA: Diagnosis not present

## 2016-04-19 DIAGNOSIS — D631 Anemia in chronic kidney disease: Secondary | ICD-10-CM | POA: Diagnosis not present

## 2016-04-19 DIAGNOSIS — N186 End stage renal disease: Secondary | ICD-10-CM | POA: Diagnosis not present

## 2016-04-19 DIAGNOSIS — Z992 Dependence on renal dialysis: Secondary | ICD-10-CM | POA: Diagnosis not present

## 2016-04-19 DIAGNOSIS — N2581 Secondary hyperparathyroidism of renal origin: Secondary | ICD-10-CM | POA: Diagnosis not present

## 2016-04-21 DIAGNOSIS — Z992 Dependence on renal dialysis: Secondary | ICD-10-CM | POA: Diagnosis not present

## 2016-04-21 DIAGNOSIS — D509 Iron deficiency anemia, unspecified: Secondary | ICD-10-CM | POA: Diagnosis not present

## 2016-04-21 DIAGNOSIS — N186 End stage renal disease: Secondary | ICD-10-CM | POA: Diagnosis not present

## 2016-04-21 DIAGNOSIS — D631 Anemia in chronic kidney disease: Secondary | ICD-10-CM | POA: Diagnosis not present

## 2016-04-21 DIAGNOSIS — N2581 Secondary hyperparathyroidism of renal origin: Secondary | ICD-10-CM | POA: Diagnosis not present

## 2016-04-22 ENCOUNTER — Ambulatory Visit (HOSPITAL_COMMUNITY)
Admission: RE | Admit: 2016-04-22 | Discharge: 2016-04-22 | Disposition: A | Discharge status: Home / Self Care | Payer: Medicare Other | Source: Ambulatory Visit | Attending: Vascular Surgery | Admitting: Vascular Surgery

## 2016-04-22 ENCOUNTER — Encounter: Payer: Self-pay | Admitting: *Deleted

## 2016-04-22 ENCOUNTER — Other Ambulatory Visit: Payer: Self-pay | Admitting: *Deleted

## 2016-04-22 ENCOUNTER — Ambulatory Visit (INDEPENDENT_AMBULATORY_CARE_PROVIDER_SITE_OTHER): Payer: Medicare Other | Admitting: Vascular Surgery

## 2016-04-22 ENCOUNTER — Encounter: Payer: Self-pay | Admitting: Vascular Surgery

## 2016-04-22 VITALS — BP 170/86 | HR 52 | Temp 97.7°F | Resp 20 | Ht 70.0 in | Wt 245.0 lb

## 2016-04-22 DIAGNOSIS — N186 End stage renal disease: Secondary | ICD-10-CM | POA: Diagnosis not present

## 2016-04-22 NOTE — Progress Notes (Signed)
Patient ID: Jesus [name], male   DOB: November 11, 1950, 66 y.o.   MRN: [id number]  Reason for Consult: Re-evaluation (eval whistling sound from R AVF - HD TTS)   Referred by Celedonio Savage, MD  Subjective:     HPI:  Jesus [name] is a 66 y.o. male with end-stage renal disease and history of right brachiocephalic AV fistula and Dr. Scot Dock performed April of last year. He had subsequent balloon maturation as well as superficial ulceration and branch ligation of the fistula. He has been on dialysis via at this fistula for some time now. Approximately 2 weeks ago he had an infiltration event of his fistula and now states that he has had difficulty with access and there is a whistling sound per the dialysis nurses for which we are to evaluate. He is not having any pain in the hand of the arm. He is otherwise doing well with dialysis on Tuesday Thursday and Saturday does not take blood thinners.  Past Medical History:  Diagnosis Date  . Anemia   . Anxiety   . Arthritis   . Cancer [facility])    bladder, ureter, Bil kidneys  . Dermatitis    scaly bump occurs every few months, pt uses cortizone cream and it goes away  . ESRD (end stage renal disease) on dialysis ([place])    Bilateral Nephrectomies- due to cancer  . History of blood transfusion   . Hypertension   . Lumbar back pain   . Mental disorder   . Pleural effusion, right    s/p right thoracentesis 10/07/15 (no malignancy by cytology report)  . Shortness of breath dyspnea    Lying down gets short of breath   History reviewed. No pertinent family history. Past Surgical History:  Procedure Laterality Date  . AV FISTULA PLACEMENT Right 06/05/2015   Procedure: ARTERIOVENOUS (AV) FISTULA CREATION RIGHT ARM;  Surgeon: Angelia Mould, MD;  Location: [place];  Service: Vascular;  Laterality: Right;  . BLADDER SURGERY  04-06-2015   removal of bladder/ [place]     left hand x 2; right hand x 1; right elbow   . CERVICAL FUSION     x 2  . EYE SURGERY Bilateral    Cataract removal  . FISTULA SUPERFICIALIZATION Right 10/30/2015   Procedure: SUPERFICIALIZATION BRACHIOCEPHALIC ARTERIOVENOUS FISTULA Right Arm;  Surgeon: Angelia Mould, MD;  Location: [place];  Service: Vascular;  Laterality: Right;  . Hemocatheter    . KNEE ARTHROSCOPY Bilateral    bilateral  . KNEE ARTHROSCOPY WITH MEDIAL MENISECTOMY Left 02/26/2013   Procedure: KNEE ARTHROSCOPY WITH MEDIAL MENISECTOMY;  Surgeon: Garald Balding, MD;  Location: [place];  Service: Orthopedics;  Laterality: Left;  . LIGATION OF COMPETING BRANCHES OF ARTERIOVENOUS FISTULA Right 10/30/2015   Procedure: LIGATION OF COMPETING BRANCHES OF BRACHIOCEPHALIC ARTERIOVENOUS FISTULA;  Surgeon: Angelia Mould, MD;  Location: [place];  Service: Vascular;  Laterality: Right;  . NEPHRECTOMY Right September 12, 2012  . NEPHRECTOMY Left 04-06-2015   [place] Right 09/07/2015   Procedure: Fistulagram;  Surgeon: Angelia Mould, MD;  Location: [place] CV LAB;  Service: Cardiovascular;  Laterality: Right;  ARM  . PERIPHERAL VASCULAR CATHETERIZATION Right 09/07/2015   Procedure: Peripheral Vascular Balloon Angioplasty;  Surgeon: Angelia Mould, MD;  Location: [place] CV LAB;  Service: Cardiovascular;  Laterality: Right;  upper arm venous  . PROSTATECTOMY  04-06-2015  [place] Bilateral    bilateral  . TOTAL KNEE ARTHROPLASTY  01/24/2012   Procedure: TOTAL KNEE ARTHROPLASTY;  Surgeon: Garald Balding, MD;  Location: [place];  Service: Orthopedics;  Laterality: Right;  RIGHT TOTAL KNEE REPLACEMENT    Short Social History:  Social History  Substance Use Topics  . Smoking status: Never Smoker  . Smokeless tobacco: Never Used  . Alcohol use No    No Known Allergies  Current Outpatient Prescriptions  Medication Sig Dispense Refill  .  ALPRAZolam (XANAX) 1 MG tablet Take 1 mg by mouth 3 (three) times daily as needed for anxiety.     Marland Kitchen amLODipine (NORVASC) 2.5 MG tablet Take 1 tablet by mouth daily.    . Calcium-Vitamin D 600-200 MG-UNIT tablet Take by mouth.    . fluticasone (FLONASE) 50 MCG/ACT nasal spray     . metoprolol succinate (TOPROL-XL) 50 MG 24 hr tablet Take 75 mg by mouth every evening.    Marland Kitchen oxyCODONE-acetaminophen (ROXICET) 5-325 MG tablet Take 1-2 tablets by mouth every 4 (four) hours as needed. (Patient taking differently: Take 1-2 tablets by mouth every 4 (four) hours as needed for moderate pain. ) 30 tablet 0  . Polyethyl Glycol-Propyl Glycol (SYSTANE OP) Place 1 drop into both eyes daily as needed (for dry, itchy eyes).    . sevelamer carbonate (RENVELA) 800 MG tablet Take 1,600 mg by mouth 3 (three) times daily with meals.     No current facility-administered medications for this visit.     Review of Systems  Constitutional:  Constitutional negative. Respiratory: Respiratory negative.  Cardiovascular: Cardiovascular negative.  GI: Gastrointestinal negative.  Musculoskeletal: Musculoskeletal negative.  Skin: Skin negative.  Neurological: Neurological negative. Hematologic: Hematologic/lymphatic negative.  Psychiatric: Psychiatric negative.        Objective:  Objective   Vitals:   04/22/16 1543 04/22/16 1544  BP: (!) 174/88 (!) 170/86  Pulse: (!) 52   Resp: 20   Temp: 97.7 F (36.5 C)   TempSrc: Oral   SpO2: 97%   Weight: 245 lb (111.1 kg)   Height: 5\' 10"  (1.778 m)    Body mass index is 35.15 kg/m.  Physical Exam  Constitutional: He is oriented to person, place, and time. He appears well-developed.  HENT:  Head: Normocephalic.  Eyes: Pupils are equal, round, and reactive to light.  Neck: Normal range of motion.  Cardiovascular: Normal rate.   Pulses:      Radial pulses are 2+ on the right side.  Right arm av fistula with pulsatility  Pulmonary/Chest: Effort normal.  Abdominal:  Soft.  Musculoskeletal: Normal range of motion. He exhibits no edema.  Neurological: He is alert and oriented to person, place, and time.  Skin: Skin is warm and dry.  Psychiatric: He has a normal mood and affect. His behavior is normal. Judgment and thought content normal.    Data: Velocity ratio greater than 3 noted in the right mid brachium with a vessel diameter change 0.7-0.3 cm.     Assessment/Plan:     66 year old male on dialysis Tuesday Thursday Saturday via right upper extremity fistula. There is now stenosis noted on duplex and the fistula is pulsatile. We will therefore get him scheduled for fistulogram of the right upper extremity AV fistula with likely intervention of the stenosis via balloon angioplasty. Patient understands the risk and benefits having undergone this before and is willing to proceed.     Waynetta Sandy MD [facility]

## 2016-04-23 DIAGNOSIS — D631 Anemia in chronic kidney disease: Secondary | ICD-10-CM | POA: Diagnosis not present

## 2016-04-23 DIAGNOSIS — N186 End stage renal disease: Secondary | ICD-10-CM | POA: Diagnosis not present

## 2016-04-23 DIAGNOSIS — D509 Iron deficiency anemia, unspecified: Secondary | ICD-10-CM | POA: Diagnosis not present

## 2016-04-23 DIAGNOSIS — Z992 Dependence on renal dialysis: Secondary | ICD-10-CM | POA: Diagnosis not present

## 2016-04-23 DIAGNOSIS — N2581 Secondary hyperparathyroidism of renal origin: Secondary | ICD-10-CM | POA: Diagnosis not present

## 2016-04-26 DIAGNOSIS — N2581 Secondary hyperparathyroidism of renal origin: Secondary | ICD-10-CM | POA: Diagnosis not present

## 2016-04-26 DIAGNOSIS — Z992 Dependence on renal dialysis: Secondary | ICD-10-CM | POA: Diagnosis not present

## 2016-04-26 DIAGNOSIS — N186 End stage renal disease: Secondary | ICD-10-CM | POA: Diagnosis not present

## 2016-04-26 DIAGNOSIS — D631 Anemia in chronic kidney disease: Secondary | ICD-10-CM | POA: Diagnosis not present

## 2016-04-26 DIAGNOSIS — D509 Iron deficiency anemia, unspecified: Secondary | ICD-10-CM | POA: Diagnosis not present

## 2016-04-27 ENCOUNTER — Ambulatory Visit: Payer: Self-pay | Admitting: Surgery

## 2016-04-27 DIAGNOSIS — Z992 Dependence on renal dialysis: Secondary | ICD-10-CM | POA: Diagnosis not present

## 2016-04-27 DIAGNOSIS — N186 End stage renal disease: Secondary | ICD-10-CM | POA: Diagnosis not present

## 2016-04-27 DIAGNOSIS — J90 Pleural effusion, not elsewhere classified: Secondary | ICD-10-CM | POA: Diagnosis not present

## 2016-04-27 DIAGNOSIS — R0602 Shortness of breath: Secondary | ICD-10-CM | POA: Diagnosis not present

## 2016-04-27 DIAGNOSIS — R0600 Dyspnea, unspecified: Secondary | ICD-10-CM | POA: Diagnosis not present

## 2016-04-27 DIAGNOSIS — Z9889 Other specified postprocedural states: Secondary | ICD-10-CM | POA: Diagnosis not present

## 2016-04-27 NOTE — H&P (Signed)
YASSEEN SALLS 04/27/2016 2:20 PM Location: [facility] Patient #: [id number] DOB: 14-Jul-1950 Widowed / Language: Cleophus Molt / Race: White Male  History of Present Illness (Stephine Langbehn A. 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 [facility]. The operative for the most recent surgery does not describe any intra-abdominal adhesive disease. He desires peritoneal dialysis as he really does not like going to dialysis clinic. He has received clearance from his urologist as well as his nephrologist to pursue PD, and understands that he has to manage it on a daily basis at home. He lives alone with 2 dogs.  The patient is a 66 year old male.   Past Surgical History Benjiman Core, [place]; 04/27/2016 2:20 PM) Cataract Surgery Bilateral. Colon Polyp Removal - Colonoscopy Knee Surgery Bilateral. Nephrectomy Bilateral. Shoulder Surgery Bilateral. Spinal Surgery - Neck TURP  Diagnostic Studies History Benjiman Core, CMA; 04/27/2016 2:20 PM) Colonoscopy 5-10 years ago  Allergies Benjiman Core, [place]; 04/27/2016 2:22 PM) No Known Drug Allergies 04/27/2016  Medication History Benjiman Core, CMA; 04/27/2016 2:23 PM) ALPRAZolam (1MG  Tablet, Oral) Active. AmLODIPine Besylate (2.5MG  Tablet, Oral) Active. Fluticasone Propionate (50MCG/ACT Suspension, Nasal) Active. Metoprolol Succinate ER (50MG  Tablet ER 24HR, Oral) Active. Oxycodone-Acetaminophen (5-325MG  Tablet, Oral) Active. Polyethyl Glycol-Propyl Glycol (0.4-0.3% Gel, Ophthalmic) Active. Renvela (800MG  Tablet, Oral) Active. Medications Reconciled  Social History Benjiman Core, CMA; 04/27/2016 2:20 PM) Alcohol use Occasional alcohol use. Caffeine use Coffee. No drug  use Tobacco use Never smoker.  Family History Benjiman Core, [place]; 04/27/2016 2:20 PM) Arthritis Father, Mother. Hypertension Father.  Other Problems Benjiman Core, CMA; 04/27/2016 2:20 PM) Anxiety Disorder Arthritis Back Pain Cancer Chronic Renal Failure Syndrome High blood pressure     Review of Systems (Armen Glenn CMA; 04/27/2016 2:20 PM) General Present- Chills and Fatigue. Not Present- Appetite Loss, Fever, Night Sweats, Weight Gain and Weight Loss. Skin Present- Dryness. Not Present- Change in Wart/Mole, Hives, Jaundice, New Lesions, Non-Healing Wounds, Rash and Ulcer. HEENT Present- Ringing in the Ears and Sinus Pain. Not Present- Earache, Hearing Loss, Hoarseness, Nose Bleed, Oral Ulcers, Seasonal Allergies, Sore Throat, Visual Disturbances, Wears glasses/contact lenses and Yellow Eyes. Respiratory Not Present- Bloody sputum, Chronic Cough, Difficulty Breathing, Snoring and Wheezing. Breast Not Present- Breast Mass, Breast Pain, Nipple Discharge and Skin Changes. Cardiovascular Present- Difficulty Breathing Lying Down. Not Present- Chest Pain, Leg Cramps, Palpitations, Rapid Heart Rate, Shortness of Breath and Swelling of Extremities. Gastrointestinal Present- Bloating and Excessive gas. Not Present- Abdominal Pain, Bloody Stool, Change in Bowel Habits, Chronic diarrhea, Constipation, Difficulty Swallowing, Gets full quickly at meals, Hemorrhoids, Indigestion, Nausea, Rectal Pain and Vomiting. Male Genitourinary Not Present- Blood in Urine, Change in Urinary Stream, Frequency, Impotence, Nocturia, Painful Urination, Urgency and Urine Leakage. Musculoskeletal Present- Back Pain and Joint Pain. Not Present- Joint Stiffness, Muscle Pain, Muscle Weakness and Swelling of Extremities. Neurological Present- Headaches. Not Present- Decreased Memory, Fainting, Numbness, Seizures, Tingling, Tremor, Trouble walking and Weakness. Psychiatric Present- Anxiety. Not Present- Bipolar,  Change in Sleep Pattern, Depression, Fearful and Frequent crying. Endocrine Not Present- Cold Intolerance, Excessive Hunger, Hair Changes, Heat Intolerance, Hot flashes and New Diabetes. Hematology Not Present- Blood Thinners, Easy Bruising, Excessive bleeding, Gland problems, HIV and Persistent Infections.  Vitals (Armen Glenn CMA; 04/27/2016 2:21 PM) 04/27/2016 2:21 PM Weight: 243.25 lb Height: 70in Body Surface  Area: 2.27 m Body Mass Index: 34.9 kg/m  Temp.: 98.76F  Pulse: 57 (Regular)  P.OX: 93% (Room air) BP: 172/102 (Sitting, Left Arm, Standard)      Physical Exam (Laurey Salser A. Kae Heller MD; 04/27/2016 2:44 PM)  General Note: Alert and oriented no distress  Integumentary Note: No lesions or rashes on limited skin exam  Head and Neck Note: No mass or thyromegaly  Eye Note: Anicteric, extraocular motion intact  ENMT Note: Moist mucous membranes, good dentition  Chest and Lung Exam Note: Unlabored respirations, symmetrical air entry  Cardiovascular Note: Regular rate and rhythm, no pedal edema  Abdomen Note: Soft, nontender nondistended. Multiple well-healed surgical incisions including several laparoscopic incisions, a short midline laparotomy and a right lower quadrant transverse incision. No hernia or mass  Neurologic Note: Grossly intact, normal gait  Neuropsychiatric Note: Normal mood and affect, appropriate insight  Musculoskeletal Note: Strength symmetrical throughout, no deformity    Assessment & Plan (Fatisha Rabalais A. Kae Heller MD; 04/27/2016 2:46 PM)  ESRD (END STAGE RENAL DISEASE) ON DIALYSIS (N18.6) Story: Desires peritoneal dialysis. I discussed with him that I would offer a laparoscopic assisted peritoneal dialysis catheter placement. If, once we enter the peritoneal cavity, he is found to have extensive adhesions than I would abort the procedure as this would be prohibitive to functional peritoneal dialysis and extensive adhesio lysis  creates a bloody environment which would be likely to clotting off of the catheter. If however his adhesions are minimal than I would proceed. He understands that he would not be able to use the catheter for at least 2 weeks after placement and he would need to undergo further education with the dialysis nurse. We discussed risks of injury to intra-abdominal structures, bleeding, infection, pain, scarring, and risks of general anesthesia including heart attack, stroke, blood clots, death. He expressed understanding of these things and wishes to proceed as soon as possible

## 2016-04-28 ENCOUNTER — Other Ambulatory Visit: Payer: Self-pay | Admitting: Radiology

## 2016-04-28 ENCOUNTER — Other Ambulatory Visit (HOSPITAL_COMMUNITY): Payer: Self-pay | Admitting: Nephrology

## 2016-04-28 DIAGNOSIS — Z992 Dependence on renal dialysis: Secondary | ICD-10-CM | POA: Diagnosis not present

## 2016-04-28 DIAGNOSIS — N186 End stage renal disease: Secondary | ICD-10-CM | POA: Diagnosis not present

## 2016-04-28 DIAGNOSIS — N2581 Secondary hyperparathyroidism of renal origin: Secondary | ICD-10-CM | POA: Diagnosis not present

## 2016-04-28 DIAGNOSIS — D509 Iron deficiency anemia, unspecified: Secondary | ICD-10-CM | POA: Diagnosis not present

## 2016-04-28 DIAGNOSIS — D631 Anemia in chronic kidney disease: Secondary | ICD-10-CM | POA: Diagnosis not present

## 2016-04-29 ENCOUNTER — Encounter (HOSPITAL_COMMUNITY): Payer: Self-pay

## 2016-04-29 ENCOUNTER — Other Ambulatory Visit (HOSPITAL_COMMUNITY): Payer: Self-pay | Admitting: Nephrology

## 2016-04-29 ENCOUNTER — Ambulatory Visit (HOSPITAL_COMMUNITY)
Admission: RE | Admit: 2016-04-29 | Discharge: 2016-04-29 | Disposition: A | Discharge status: Home / Self Care | Payer: Medicare Other | Source: Ambulatory Visit | Attending: Nephrology | Admitting: Nephrology

## 2016-04-29 DIAGNOSIS — Y832 Surgical operation with anastomosis, bypass or graft as the cause of abnormal reaction of the patient, or of later complication, without mention of misadventure at the time of the procedure: Secondary | ICD-10-CM | POA: Diagnosis not present

## 2016-04-29 DIAGNOSIS — Z992 Dependence on renal dialysis: Secondary | ICD-10-CM | POA: Insufficient documentation

## 2016-04-29 DIAGNOSIS — Z905 Acquired absence of kidney: Secondary | ICD-10-CM | POA: Diagnosis not present

## 2016-04-29 DIAGNOSIS — N186 End stage renal disease: Secondary | ICD-10-CM

## 2016-04-29 DIAGNOSIS — F419 Anxiety disorder, unspecified: Secondary | ICD-10-CM | POA: Diagnosis not present

## 2016-04-29 DIAGNOSIS — T82858A Stenosis of vascular prosthetic devices, implants and grafts, initial encounter: Secondary | ICD-10-CM | POA: Insufficient documentation

## 2016-04-29 DIAGNOSIS — F99 Mental disorder, not otherwise specified: Secondary | ICD-10-CM | POA: Insufficient documentation

## 2016-04-29 DIAGNOSIS — I120 Hypertensive chronic kidney disease with stage 5 chronic kidney disease or end stage renal disease: Secondary | ICD-10-CM | POA: Insufficient documentation

## 2016-04-29 DIAGNOSIS — T82868A Thrombosis of vascular prosthetic devices, implants and grafts, initial encounter: Secondary | ICD-10-CM | POA: Diagnosis not present

## 2016-04-29 DIAGNOSIS — Z7951 Long term (current) use of inhaled steroids: Secondary | ICD-10-CM | POA: Insufficient documentation

## 2016-04-29 DIAGNOSIS — M1990 Unspecified osteoarthritis, unspecified site: Secondary | ICD-10-CM | POA: Insufficient documentation

## 2016-04-29 DIAGNOSIS — Z8551 Personal history of malignant neoplasm of bladder: Secondary | ICD-10-CM | POA: Diagnosis not present

## 2016-04-29 DIAGNOSIS — D631 Anemia in chronic kidney disease: Secondary | ICD-10-CM | POA: Insufficient documentation

## 2016-04-29 HISTORY — PX: IR GENERIC HISTORICAL: IMG1180011

## 2016-04-29 LAB — PROTIME-INR
INR: 1.04
Prothrombin Time: 13.6 seconds (ref 11.4–15.2)

## 2016-04-29 LAB — BASIC METABOLIC PANEL
Anion gap: 15 (ref 5–15)
BUN: 66 mg/dL — AB (ref 6–20)
CHLORIDE: 96 mmol/L — AB (ref 101–111)
CO2: 25 mmol/L (ref 22–32)
CREATININE: 15.37 mg/dL — AB (ref 0.61–1.24)
Calcium: 8.6 mg/dL — ABNORMAL LOW (ref 8.9–10.3)
GFR calc Af Amer: 3 mL/min — ABNORMAL LOW (ref >60)
GFR calc non Af Amer: 3 mL/min — ABNORMAL LOW (ref >60)
Glucose, Bld: 123 mg/dL — ABNORMAL HIGH (ref 65–99)
Potassium: 5.5 mmol/L — ABNORMAL HIGH (ref 3.5–5.1)
Sodium: 136 mmol/L (ref 135–145)

## 2016-04-29 LAB — CBC
HCT: 28.7 % — ABNORMAL LOW (ref 39.0–52.0)
Hemoglobin: 9.8 g/dL — ABNORMAL LOW (ref 13.0–17.0)
MCH: 35.1 pg — ABNORMAL HIGH (ref 26.0–34.0)
MCHC: 34.1 g/dL (ref 30.0–36.0)
MCV: 102.9 fL — AB (ref 78.0–100.0)
PLATELETS: 250 10*3/uL (ref 150–400)
RBC: 2.79 MIL/uL — ABNORMAL LOW (ref 4.22–5.81)
RDW: 13.8 % (ref 11.5–15.5)
WBC: 15.1 10*3/uL — ABNORMAL HIGH (ref 4.0–10.5)

## 2016-04-29 LAB — APTT: aPTT: 34 seconds (ref 24–36)

## 2016-04-29 MED ORDER — HEPARIN SODIUM (PORCINE) 1000 UNIT/ML IJ SOLN
3000.0000 [IU] | Freq: Once | INTRAMUSCULAR | Status: AC
  Administered 2016-04-29: 3000 [IU] via INTRAVENOUS

## 2016-04-29 MED ORDER — ALTEPLASE 2 MG IJ SOLR
INTRAMUSCULAR | Status: AC
  Filled 2016-04-29: qty 2

## 2016-04-29 MED ORDER — MIDAZOLAM HCL 2 MG/2ML IJ SOLN
INTRAMUSCULAR | Status: AC
  Filled 2016-04-29: qty 2

## 2016-04-29 MED ORDER — SODIUM CHLORIDE 0.9 % IV SOLN: INTRAVENOUS | Status: DC

## 2016-04-29 MED ORDER — FENTANYL CITRATE (PF) 100 MCG/2ML IJ SOLN
INTRAMUSCULAR | Status: AC | PRN
  Administered 2016-04-29 (×2): 25 ug via INTRAVENOUS
  Administered 2016-04-29: 50 ug via INTRAVENOUS

## 2016-04-29 MED ORDER — HEPARIN SODIUM (PORCINE) 1000 UNIT/ML IJ SOLN
INTRAMUSCULAR | Status: AC
  Filled 2016-04-29: qty 1

## 2016-04-29 MED ORDER — MIDAZOLAM HCL 2 MG/2ML IJ SOLN
INTRAMUSCULAR | Status: AC | PRN
  Administered 2016-04-29 (×2): 1 mg via INTRAVENOUS

## 2016-04-29 MED ORDER — LIDOCAINE HCL (PF) 1 % IJ SOLN
INTRAMUSCULAR | Status: AC
  Filled 2016-04-29: qty 10

## 2016-04-29 MED ORDER — STERILE WATER FOR INJECTION IJ SOLN
INTRAMUSCULAR | Status: AC
  Filled 2016-04-29: qty 10

## 2016-04-29 MED ORDER — IOPAMIDOL (ISOVUE-300) INJECTION 61%
INTRAVENOUS | Status: AC
  Administered 2016-04-29: 50 mL
  Filled 2016-04-29: qty 100

## 2016-04-29 MED ORDER — LIDOCAINE HCL 1 % IJ SOLN
INTRAMUSCULAR | Status: AC | PRN
  Administered 2016-04-29: 15 mL

## 2016-04-29 MED ORDER — FENTANYL CITRATE (PF) 100 MCG/2ML IJ SOLN
INTRAMUSCULAR | Status: AC
  Filled 2016-04-29: qty 2

## 2016-04-29 NOTE — Sedation Documentation (Signed)
Patient is resting comfortably. 

## 2016-04-29 NOTE — Procedures (Signed)
RUE HD fistula declot and venous PTA No complication No blood loss. See complete dictation in [facility].

## 2016-04-29 NOTE — H&P (Signed)
Chief Complaint: Patient was seen in consultation today for right arm dialysis fistula thrombolysis with possible angioplasty/stent at the request of [place]  Referring Physician(s): Fran Lowes  Supervising Physician: Arne Cleveland  Patient Status: [facility] - Out-pt  History of Present Illness: Jesus [name] is a 66 y.o. male   Pt with Hx bladder Ca; bladder removal 04/2015 Prostatectomy 2017 Bilat nephrectomies 2014;2017--Cancer ESRD Rt arm fistula placed 05/2015 Last use 04/26/16 without issue Now Rt arm dialysis fistula clotted Now no thrill; pulse Request for intervention per Dr Lowanda Foster  Pt planned for peritoneal dialysis catheter placement with Dr Georgiann Cocker soon  Past Medical History:  Diagnosis Date  . Anemia   . Anxiety   . Arthritis   . Cancer [facility])    bladder, ureter, Bil kidneys  . Dermatitis    scaly bump occurs every few months, pt uses cortizone cream and it goes away  . ESRD (end stage renal disease) on dialysis ([place])    Bilateral Nephrectomies- due to cancer  . History of blood transfusion   . Hypertension   . Lumbar back pain   . Mental disorder   . Pleural effusion, right    s/p right thoracentesis 10/07/15 (no malignancy by cytology report)  . Shortness of breath dyspnea    Lying down gets short of breath    Past Surgical History:  Procedure Laterality Date  . AV FISTULA PLACEMENT Right 06/05/2015   Procedure: ARTERIOVENOUS (AV) FISTULA CREATION RIGHT ARM;  Surgeon: Angelia Mould, MD;  Location: [place];  Service: Vascular;  Laterality: Right;  . BLADDER SURGERY  04-06-2015   removal of bladder/ [place]     left hand x 2; right hand x 1; right elbow  . CERVICAL FUSION     x 2  . EYE SURGERY Bilateral    Cataract removal  . FISTULA SUPERFICIALIZATION Right 10/30/2015   Procedure: SUPERFICIALIZATION BRACHIOCEPHALIC ARTERIOVENOUS FISTULA Right Arm;  Surgeon: Angelia Mould,  MD;  Location: [place];  Service: Vascular;  Laterality: Right;  . Hemocatheter    . KNEE ARTHROSCOPY Bilateral    bilateral  . KNEE ARTHROSCOPY WITH MEDIAL MENISECTOMY Left 02/26/2013   Procedure: KNEE ARTHROSCOPY WITH MEDIAL MENISECTOMY;  Surgeon: Garald Balding, MD;  Location: [place];  Service: Orthopedics;  Laterality: Left;  . LIGATION OF COMPETING BRANCHES OF ARTERIOVENOUS FISTULA Right 10/30/2015   Procedure: LIGATION OF COMPETING BRANCHES OF BRACHIOCEPHALIC ARTERIOVENOUS FISTULA;  Surgeon: Angelia Mould, MD;  Location: [place];  Service: Vascular;  Laterality: Right;  . NEPHRECTOMY Right September 12, 2012  . NEPHRECTOMY Left 04-06-2015   [place] Right 09/07/2015   Procedure: Fistulagram;  Surgeon: Angelia Mould, MD;  Location: [place] CV LAB;  Service: Cardiovascular;  Laterality: Right;  ARM  . PERIPHERAL VASCULAR CATHETERIZATION Right 09/07/2015   Procedure: Peripheral Vascular Balloon Angioplasty;  Surgeon: Angelia Mould, MD;  Location: [place] CV LAB;  Service: Cardiovascular;  Laterality: Right;  upper arm venous  . PROSTATECTOMY  04-06-2015   [place] Bilateral    bilateral  . TOTAL KNEE ARTHROPLASTY  01/24/2012   Procedure: TOTAL KNEE ARTHROPLASTY;  Surgeon: Garald Balding, MD;  Location: [place];  Service: Orthopedics;  Laterality: Right;  RIGHT TOTAL KNEE REPLACEMENT    Allergies: Patient has no known allergies.  Medications: Prior to Admission medications  Medication Sig Start Date End Date Taking? Authorizing Provider  ALPRAZolam Duanne Moron) 1 MG tablet Take 1 mg by mouth 3 (three) times daily as needed for anxiety.    Yes Historical Provider, MD  amLODipine (NORVASC) 5 MG tablet Take 1 tablet by mouth daily. 10/11/15  Yes Historical Provider, MD  fluticasone Asencion Islam) 50 MCG/ACT nasal spray  12/23/15  Yes Historical Provider, MD  metoprolol  succinate (TOPROL-XL) 50 MG 24 hr tablet Take 75 mg by mouth every evening. 09/21/15  Yes Historical Provider, MD  oxyCODONE-acetaminophen (ROXICET) 5-325 MG tablet Take 1-2 tablets by mouth every 4 (four) hours as needed. Patient taking differently: Take 1-2 tablets by mouth every 4 (four) hours as needed for moderate pain.  06/05/15  Yes Angelia Mould, MD  Polyethyl Glycol-Propyl Glycol (SYSTANE OP) Place 1 drop into both eyes daily as needed (for dry, itchy eyes).   Yes Historical Provider, MD  sevelamer carbonate (RENVELA) 800 MG tablet Take 1,600 mg by mouth 3 (three) times daily with meals.   Yes Historical Provider, MD     History reviewed. No pertinent family history.  Social History   Social History  . Marital status: Widowed    Spouse name: N/A  . Number of children: N/A  . Years of education: N/A   Social History Main Topics  . Smoking status: Never Smoker  . Smokeless tobacco: Never Used  . Alcohol use No  . Drug use: No  . Sexual activity: Not Asked   Other Topics Concern  . None   Social History Narrative  . None    Review of Systems: A 12 point ROS discussed and pertinent positives are indicated in the HPI above.  All other systems are negative.  Review of Systems  Constitutional: Negative for activity change, appetite change, fatigue and fever.  Respiratory: Negative for cough and shortness of breath.   Cardiovascular: Negative for chest pain.  Gastrointestinal: Negative for abdominal pain.  Neurological: Negative for weakness.  Psychiatric/Behavioral: Negative for behavioral problems and confusion.    Vital Signs: BP (!) 205/96 (BP Location: Left Arm)   Pulse (!) 57   Temp 98.4 F (36.9 C) (Oral)   Ht 5\' 10"  (1.778 m)   Wt 240 lb (108.9 kg)   SpO2 100%   BMI 34.44 kg/m   Physical Exam  Constitutional: He is oriented to person, place, and time.  Cardiovascular: Normal rate, regular rhythm and normal heart sounds.   Pulmonary/Chest: Effort  normal and breath sounds normal.  Abdominal: Soft.  Musculoskeletal: Normal range of motion.  Right upper arm dialysis fistula no thrill; no pulse  Neurological: He is alert and oriented to person, place, and time.  Skin: Skin is warm and dry.  Psychiatric: He has a normal mood and affect. His behavior is normal. Judgment and thought content normal.  Nursing note and vitals reviewed.   Mallampati Score:  MD Evaluation Airway: WNL Heart: WNL Abdomen: WNL Chest/ Lungs: WNL ASA  Classification: 3 Mallampati/Airway Score: Two  Imaging: No results found.  Labs:  CBC:  Recent Labs  06/05/15 0710 09/07/15 0732 10/30/15 0859  HGB 7.5* 11.2* 10.5*  HCT 22.0* 33.0* 31.0*    COAGS: No results for input(s): INR, APTT in the last 8760 hours.  BMP:  Recent Labs  06/05/15 0710 09/07/15 0732 10/30/15 0859  NA 137 136 139  K 4.1 4.4 4.6  CL  --  98*  --   GLUCOSE 89 74 82  BUN  --  45*  --  CREATININE  --  9.80*  --     LIVER FUNCTION TESTS: No results for input(s): BILITOT, AST, ALT, ALKPHOS, PROT, ALBUMIN in the last 8760 hours.  TUMOR MARKERS: No results for input(s): AFPTM, CEA, CA199, CHROMGRNA in the last 8760 hours.  Assessment and Plan:  Right arm dialysis fistula clotted Now scheduled for thrombolysis with possible angioplasty/stent placement. Possible tunneled dialysis catheter placement Risks and Benefits discussed with the patient including, but not limited to bleeding, infection, vascular injury, pulmonary embolism, need for tunneled HD catheter placement or even death. All of the patient's questions were answered, patient is agreeable to proceed. Consent signed and in chart.   Thank you for this interesting consult.  I greatly enjoyed meeting DARRION WYSZYNSKI and look forward to participating in their care.  A copy of this report was sent to the requesting provider on this date.  Electronically Signed: Monia Sabal A 04/29/2016, 12:19 PM   I  spent a total of  30 Minutes   in face to face in clinical consultation, greater than 50% of which was counseling/coordinating care for right arm dialysis fistula thrombolysis

## 2016-04-29 NOTE — Discharge Instructions (Signed)
Fistulogram, Care After °Refer to this sheet in the next few weeks. These instructions provide you with information on caring for yourself after your procedure. Your health care provider may also give you more specific instructions. Your treatment has been planned according to current medical practices, but problems sometimes occur. Call your health care provider if you have any problems or questions after your procedure. °What can I expect after the procedure? °After your procedure, it is typical to have the following: °· A small amount of discomfort in the area where the catheters were placed. °· A small amount of bruising around the fistula. °· Sleepiness and fatigue. °Follow these instructions at home: °· Rest at home for the day following your procedure. °· Do not drive or operate heavy machinery while taking pain medicine. °· Take medicines only as directed by your health care provider. °· Do not take baths, swim, or use a hot tub until your health care provider approves. You may shower 24 hours after the procedure or as directed by your health care provider. °· There are many different ways to close and cover an incision, including stitches, skin glue, and adhesive strips. Follow your health care provider's instructions on: °¨ Incision care. °¨ Bandage (dressing) changes and removal. °¨ Incision closure removal. °· Monitor your dialysis fistula carefully. °Contact a health care provider if: °· You have drainage, redness, swelling, or pain at your catheter site. °· You have a fever. °· You have chills. °Get help right away if: °· You feel weak. °· You have trouble balancing. °· You have trouble moving your arms or legs. °· You have problems with your speech or vision. °· You can no longer feel a vibration or buzz when you put your fingers over your dialysis fistula. °· The limb that was used for the procedure: °¨ Swells. °¨ Is painful. °¨ Is cold. °¨ Is discolored, such as blue or pale white. °This information  is not intended to replace advice given to you by your health care provider. Make sure you discuss any questions you have with your health care provider. °Document Released: 07/01/2013 Document Revised: 07/23/2015 Document Reviewed: 04/05/2013 °Elsevier Interactive Patient Education © 2017 Elsevier Inc. ° °

## 2016-04-30 DIAGNOSIS — N2581 Secondary hyperparathyroidism of renal origin: Secondary | ICD-10-CM | POA: Diagnosis not present

## 2016-04-30 DIAGNOSIS — D631 Anemia in chronic kidney disease: Secondary | ICD-10-CM | POA: Diagnosis not present

## 2016-04-30 DIAGNOSIS — D509 Iron deficiency anemia, unspecified: Secondary | ICD-10-CM | POA: Diagnosis not present

## 2016-04-30 DIAGNOSIS — N186 End stage renal disease: Secondary | ICD-10-CM | POA: Diagnosis not present

## 2016-04-30 DIAGNOSIS — Z992 Dependence on renal dialysis: Secondary | ICD-10-CM | POA: Diagnosis not present

## 2016-05-02 ENCOUNTER — Encounter (HOSPITAL_COMMUNITY): Admission: RE | Payer: Self-pay | Source: Ambulatory Visit

## 2016-05-02 ENCOUNTER — Ambulatory Visit (HOSPITAL_COMMUNITY): Admission: RE | Admit: 2016-05-02 | Payer: Medicare Other | Source: Ambulatory Visit | Admitting: Vascular Surgery

## 2016-05-02 SURGERY — A/V FISTULAGRAM
Anesthesia: LOCAL | Laterality: Right

## 2016-05-02 SURGERY — Surgical Case
Anesthesia: *Unknown

## 2016-05-03 DIAGNOSIS — N2581 Secondary hyperparathyroidism of renal origin: Secondary | ICD-10-CM | POA: Diagnosis not present

## 2016-05-03 DIAGNOSIS — D509 Iron deficiency anemia, unspecified: Secondary | ICD-10-CM | POA: Diagnosis not present

## 2016-05-03 DIAGNOSIS — N186 End stage renal disease: Secondary | ICD-10-CM | POA: Diagnosis not present

## 2016-05-03 DIAGNOSIS — Z992 Dependence on renal dialysis: Secondary | ICD-10-CM | POA: Diagnosis not present

## 2016-05-03 DIAGNOSIS — D631 Anemia in chronic kidney disease: Secondary | ICD-10-CM | POA: Diagnosis not present

## 2016-05-05 DIAGNOSIS — Z992 Dependence on renal dialysis: Secondary | ICD-10-CM | POA: Diagnosis not present

## 2016-05-05 DIAGNOSIS — N2581 Secondary hyperparathyroidism of renal origin: Secondary | ICD-10-CM | POA: Diagnosis not present

## 2016-05-05 DIAGNOSIS — D631 Anemia in chronic kidney disease: Secondary | ICD-10-CM | POA: Diagnosis not present

## 2016-05-05 DIAGNOSIS — N186 End stage renal disease: Secondary | ICD-10-CM | POA: Diagnosis not present

## 2016-05-05 DIAGNOSIS — D509 Iron deficiency anemia, unspecified: Secondary | ICD-10-CM | POA: Diagnosis not present

## 2016-05-07 DIAGNOSIS — N2581 Secondary hyperparathyroidism of renal origin: Secondary | ICD-10-CM | POA: Diagnosis not present

## 2016-05-07 DIAGNOSIS — D509 Iron deficiency anemia, unspecified: Secondary | ICD-10-CM | POA: Diagnosis not present

## 2016-05-07 DIAGNOSIS — D631 Anemia in chronic kidney disease: Secondary | ICD-10-CM | POA: Diagnosis not present

## 2016-05-07 DIAGNOSIS — Z992 Dependence on renal dialysis: Secondary | ICD-10-CM | POA: Diagnosis not present

## 2016-05-07 DIAGNOSIS — N186 End stage renal disease: Secondary | ICD-10-CM | POA: Diagnosis not present

## 2016-05-10 ENCOUNTER — Telehealth: Payer: Self-pay | Admitting: Cardiology

## 2016-05-10 DIAGNOSIS — N186 End stage renal disease: Secondary | ICD-10-CM | POA: Diagnosis not present

## 2016-05-10 DIAGNOSIS — Z992 Dependence on renal dialysis: Secondary | ICD-10-CM | POA: Diagnosis not present

## 2016-05-10 DIAGNOSIS — N2581 Secondary hyperparathyroidism of renal origin: Secondary | ICD-10-CM | POA: Diagnosis not present

## 2016-05-10 DIAGNOSIS — D631 Anemia in chronic kidney disease: Secondary | ICD-10-CM | POA: Diagnosis not present

## 2016-05-10 DIAGNOSIS — D509 Iron deficiency anemia, unspecified: Secondary | ICD-10-CM | POA: Diagnosis not present

## 2016-05-10 NOTE — Telephone Encounter (Signed)
Notes faxed to [facility] °

## 2016-05-12 DIAGNOSIS — N2581 Secondary hyperparathyroidism of renal origin: Secondary | ICD-10-CM | POA: Diagnosis not present

## 2016-05-12 DIAGNOSIS — D509 Iron deficiency anemia, unspecified: Secondary | ICD-10-CM | POA: Diagnosis not present

## 2016-05-12 DIAGNOSIS — N186 End stage renal disease: Secondary | ICD-10-CM | POA: Diagnosis not present

## 2016-05-12 DIAGNOSIS — Z992 Dependence on renal dialysis: Secondary | ICD-10-CM | POA: Diagnosis not present

## 2016-05-12 DIAGNOSIS — D631 Anemia in chronic kidney disease: Secondary | ICD-10-CM | POA: Diagnosis not present

## 2016-05-14 DIAGNOSIS — N186 End stage renal disease: Secondary | ICD-10-CM | POA: Diagnosis not present

## 2016-05-14 DIAGNOSIS — D509 Iron deficiency anemia, unspecified: Secondary | ICD-10-CM | POA: Diagnosis not present

## 2016-05-14 DIAGNOSIS — D631 Anemia in chronic kidney disease: Secondary | ICD-10-CM | POA: Diagnosis not present

## 2016-05-14 DIAGNOSIS — Z992 Dependence on renal dialysis: Secondary | ICD-10-CM | POA: Diagnosis not present

## 2016-05-14 DIAGNOSIS — N2581 Secondary hyperparathyroidism of renal origin: Secondary | ICD-10-CM | POA: Diagnosis not present

## 2016-05-16 ENCOUNTER — Ambulatory Visit: Payer: Medicare Other | Admitting: Cardiology

## 2016-05-17 DIAGNOSIS — D631 Anemia in chronic kidney disease: Secondary | ICD-10-CM | POA: Diagnosis not present

## 2016-05-17 DIAGNOSIS — Z992 Dependence on renal dialysis: Secondary | ICD-10-CM | POA: Diagnosis not present

## 2016-05-17 DIAGNOSIS — N186 End stage renal disease: Secondary | ICD-10-CM | POA: Diagnosis not present

## 2016-05-17 DIAGNOSIS — N2581 Secondary hyperparathyroidism of renal origin: Secondary | ICD-10-CM | POA: Diagnosis not present

## 2016-05-17 DIAGNOSIS — D509 Iron deficiency anemia, unspecified: Secondary | ICD-10-CM | POA: Diagnosis not present

## 2016-05-19 DIAGNOSIS — N186 End stage renal disease: Secondary | ICD-10-CM | POA: Diagnosis not present

## 2016-05-19 DIAGNOSIS — D631 Anemia in chronic kidney disease: Secondary | ICD-10-CM | POA: Diagnosis not present

## 2016-05-19 DIAGNOSIS — N2581 Secondary hyperparathyroidism of renal origin: Secondary | ICD-10-CM | POA: Diagnosis not present

## 2016-05-19 DIAGNOSIS — Z992 Dependence on renal dialysis: Secondary | ICD-10-CM | POA: Diagnosis not present

## 2016-05-19 DIAGNOSIS — D509 Iron deficiency anemia, unspecified: Secondary | ICD-10-CM | POA: Diagnosis not present

## 2016-05-21 DIAGNOSIS — D509 Iron deficiency anemia, unspecified: Secondary | ICD-10-CM | POA: Diagnosis not present

## 2016-05-21 DIAGNOSIS — N186 End stage renal disease: Secondary | ICD-10-CM | POA: Diagnosis not present

## 2016-05-21 DIAGNOSIS — N2581 Secondary hyperparathyroidism of renal origin: Secondary | ICD-10-CM | POA: Diagnosis not present

## 2016-05-21 DIAGNOSIS — D631 Anemia in chronic kidney disease: Secondary | ICD-10-CM | POA: Diagnosis not present

## 2016-05-21 DIAGNOSIS — Z992 Dependence on renal dialysis: Secondary | ICD-10-CM | POA: Diagnosis not present

## 2016-05-24 ENCOUNTER — Encounter: Payer: Self-pay | Admitting: *Deleted

## 2016-05-24 DIAGNOSIS — N2581 Secondary hyperparathyroidism of renal origin: Secondary | ICD-10-CM | POA: Diagnosis not present

## 2016-05-24 DIAGNOSIS — D509 Iron deficiency anemia, unspecified: Secondary | ICD-10-CM | POA: Diagnosis not present

## 2016-05-24 DIAGNOSIS — Z992 Dependence on renal dialysis: Secondary | ICD-10-CM | POA: Diagnosis not present

## 2016-05-24 DIAGNOSIS — D631 Anemia in chronic kidney disease: Secondary | ICD-10-CM | POA: Diagnosis not present

## 2016-05-24 DIAGNOSIS — N186 End stage renal disease: Secondary | ICD-10-CM | POA: Diagnosis not present

## 2016-05-25 ENCOUNTER — Encounter: Payer: Self-pay | Admitting: *Deleted

## 2016-05-25 ENCOUNTER — Ambulatory Visit (INDEPENDENT_AMBULATORY_CARE_PROVIDER_SITE_OTHER): Payer: Medicare Other | Admitting: Cardiovascular Disease

## 2016-05-25 ENCOUNTER — Encounter: Payer: Self-pay | Admitting: Cardiovascular Disease

## 2016-05-25 ENCOUNTER — Telehealth: Payer: Self-pay | Admitting: Cardiovascular Disease

## 2016-05-25 VITALS — BP 166/77 | HR 57 | Ht 70.0 in | Wt 243.8 lb

## 2016-05-25 DIAGNOSIS — R0609 Other forms of dyspnea: Secondary | ICD-10-CM

## 2016-05-25 DIAGNOSIS — N186 End stage renal disease: Secondary | ICD-10-CM

## 2016-05-25 DIAGNOSIS — J90 Pleural effusion, not elsewhere classified: Secondary | ICD-10-CM | POA: Diagnosis not present

## 2016-05-25 DIAGNOSIS — R9431 Abnormal electrocardiogram [ECG] [EKG]: Secondary | ICD-10-CM | POA: Diagnosis not present

## 2016-05-25 DIAGNOSIS — I10 Essential (primary) hypertension: Secondary | ICD-10-CM

## 2016-05-25 DIAGNOSIS — Z01810 Encounter for preprocedural cardiovascular examination: Secondary | ICD-10-CM | POA: Diagnosis not present

## 2016-05-25 MED ORDER — AMLODIPINE BESYLATE 10 MG PO TABS: 10.0000 mg | ORAL_TABLET | Freq: Every day | ORAL | 3 refills | Status: DC

## 2016-05-25 NOTE — Progress Notes (Signed)
CARDIOLOGY CONSULT NOTE  Patient ID: Jesus [name] MRN: [id number] DOB/AGE: 1950/09/16 66 y.o.  Admit date: (Not on file) Primary Physician: Celedonio Savage, MD Referring Physician: Dr. Romana Juniper  Reason for Consultation: preoperative risk stratification  HPI: The patient is a 66 year old male with a history of end-stage renal disease and hypertension who is referred by Dr. Kae Heller for preoperative risk stratification prior to undergoing surgery for peritoneal dialysis. He has a history of urothelial cancer and is status post right nephro ureterectomy in 2014 and left nephroureterectomy and radical cystoprostatectomy on 04/06/15.  He currently undergoes hemodialysis on Tuesdays, Thursdays, and Saturdays.  He denies a personal history of heart disease. He has had recurrent right-sided pleural effusion. Thoracentesis in August 2017 was negative for malignant cells as per cytology.  He underwent another chest x-ray on 04/27/16 but I do not have these results and will have to order them.  He denies chest pain. He has been more short of breath in the past 12 months but he attributes this to being out of shape after his surgery. He said he can climb one flight of stairs without difficulty. He denies a personal history of myocardial infarction. He also denies palpitations, lightheadedness, dizziness, and syncope.  He has had problems with blood pressure management and says that pressures normally run 170-180 over high 80s. He increased the dose of amlodipine by himself from 5 to 7.5 mg daily and now has systolic readings in the 920'F.  He also wants to be evaluated for a DOT physical.  ECG performed in the office today which I ordered and personally interpreted demonstrates sinus bradycardia, 55 bpm, with a left anterior fascicular block, late R wave transition, and nonspecific intraventricular conduction delay, QRS duration 118 ms.   No Known Allergies  Current Outpatient  Prescriptions  Medication Sig Dispense Refill  . ALPRAZolam (XANAX) 1 MG tablet Take 1 mg by mouth 3 (three) times daily as needed for anxiety.     Marland Kitchen amLODipine (NORVASC) 5 MG tablet Take 1 tablet by mouth daily.    . fluticasone (FLONASE) 50 MCG/ACT nasal spray     . metoprolol succinate (TOPROL-XL) 50 MG 24 hr tablet Take 75 mg by mouth every evening.    Marland Kitchen oxyCODONE-acetaminophen (ROXICET) 5-325 MG tablet Take 1-2 tablets by mouth every 4 (four) hours as needed. (Patient taking differently: Take 1-2 tablets by mouth every 4 (four) hours as needed for moderate pain. ) 30 tablet 0  . Polyethyl Glycol-Propyl Glycol (SYSTANE OP) Place 1 drop into both eyes daily as needed (for dry, itchy eyes).    . sevelamer carbonate (RENVELA) 800 MG tablet Take 1,600 mg by mouth 3 (three) times daily with meals.     No current facility-administered medications for this visit.     Past Medical History:  Diagnosis Date  . Anemia   . Anxiety   . Arthritis   . Cancer [facility])    bladder, ureter, Bil kidneys  . Dermatitis    scaly bump occurs every few months, pt uses cortizone cream and it goes away  . ESRD (end stage renal disease) on dialysis ([place])    Bilateral Nephrectomies- due to cancer  . History of blood transfusion   . Hypertension   . Lumbar back pain   . Mental disorder   . Pleural effusion, right    s/p right thoracentesis 10/07/15 (no malignancy by cytology report)  . Shortness of breath dyspnea    Lying down  gets short of breath    Past Surgical History:  Procedure Laterality Date  . AV FISTULA PLACEMENT Right 06/05/2015   Procedure: ARTERIOVENOUS (AV) FISTULA CREATION RIGHT ARM;  Surgeon: Angelia Mould, MD;  Location: [place];  Service: Vascular;  Laterality: Right;  . BLADDER SURGERY  04-06-2015   removal of bladder/ [place]     left hand x 2; right hand x 1; right elbow  . CERVICAL FUSION     x 2  . EYE SURGERY Bilateral     Cataract removal  . FISTULA SUPERFICIALIZATION Right 10/30/2015   Procedure: SUPERFICIALIZATION BRACHIOCEPHALIC ARTERIOVENOUS FISTULA Right Arm;  Surgeon: Angelia Mould, MD;  Location: [place];  Service: Vascular;  Laterality: Right;  . Hemocatheter    . IR GENERIC HISTORICAL Right 04/29/2016   IR THROMBECTOMY AV FISTULA W/THROMBOLYSIS/PTA INC/SHUNT/IMG RIGHT 04/29/2016 Arne Cleveland, MD [facility]-INTERV RAD  . IR GENERIC HISTORICAL  04/29/2016   IR US GUIDE VASC ACCESS RIGHT 04/29/2016 Arne Cleveland, MD [facility]-INTERV RAD  . KNEE ARTHROSCOPY Bilateral    bilateral  . KNEE ARTHROSCOPY WITH MEDIAL MENISECTOMY Left 02/26/2013   Procedure: KNEE ARTHROSCOPY WITH MEDIAL MENISECTOMY;  Surgeon: Garald Balding, MD;  Location: [place];  Service: Orthopedics;  Laterality: Left;  . LIGATION OF COMPETING BRANCHES OF ARTERIOVENOUS FISTULA Right 10/30/2015   Procedure: LIGATION OF COMPETING BRANCHES OF BRACHIOCEPHALIC ARTERIOVENOUS FISTULA;  Surgeon: Angelia Mould, MD;  Location: [place];  Service: Vascular;  Laterality: Right;  . NEPHRECTOMY Right September 12, 2012  . NEPHRECTOMY Left 04-06-2015   [place] Right 09/07/2015   Procedure: Fistulagram;  Surgeon: Angelia Mould, MD;  Location: [place] CV LAB;  Service: Cardiovascular;  Laterality: Right;  ARM  . PERIPHERAL VASCULAR CATHETERIZATION Right 09/07/2015   Procedure: Peripheral Vascular Balloon Angioplasty;  Surgeon: Angelia Mould, MD;  Location: [place] CV LAB;  Service: Cardiovascular;  Laterality: Right;  upper arm venous  . PROSTATECTOMY  04-06-2015   [place] Bilateral    bilateral  . TOTAL KNEE ARTHROPLASTY  01/24/2012   Procedure: TOTAL KNEE ARTHROPLASTY;  Surgeon: Garald Balding, MD;  Location: [place];  Service: Orthopedics;  Laterality: Right;  RIGHT TOTAL KNEE REPLACEMENT    Social History   Social History  . Marital  status: Widowed    Spouse name: N/A  . Number of children: N/A  . Years of education: N/A   Occupational History  . Not on file.   Social History Main Topics  . Smoking status: Never Smoker  . Smokeless tobacco: Never Used  . Alcohol use No  . Drug use: No  . Sexual activity: Not on file   Other Topics Concern  . Not on file   Social History Narrative  . No narrative on file     No family history of premature CAD in 1st degree relatives.  Prior to Admission medications   Medication Sig Start Date End Date Taking? Authorizing Provider  ALPRAZolam Duanne Moron) 1 MG tablet Take 1 mg by mouth 3 (three) times daily as needed for anxiety.    Yes Historical Provider, MD  amLODipine (NORVASC) 5 MG tablet Take 1 tablet by mouth daily. 10/11/15  Yes Historical Provider, MD  fluticasone Asencion Islam) 50 MCG/ACT nasal spray  12/23/15  Yes Historical Provider, MD  metoprolol succinate (TOPROL-XL) 50 MG 24 hr tablet Take 75 mg by  mouth every evening. 09/21/15  Yes Historical Provider, MD  oxyCODONE-acetaminophen (ROXICET) 5-325 MG tablet Take 1-2 tablets by mouth every 4 (four) hours as needed. Patient taking differently: Take 1-2 tablets by mouth every 4 (four) hours as needed for moderate pain.  06/05/15  Yes Angelia Mould, MD  Polyethyl Glycol-Propyl Glycol (SYSTANE OP) Place 1 drop into both eyes daily as needed (for dry, itchy eyes).   Yes Historical Provider, MD  sevelamer carbonate (RENVELA) 800 MG tablet Take 1,600 mg by mouth 3 (three) times daily with meals.   Yes Historical Provider, MD     Review of systems complete and found to be negative unless listed above in HPI     Physical exam Blood pressure (!) 166/77, pulse (!) 57, height 5\' 10"  (1.778 m), weight 243 lb 12.8 oz (110.6 kg), SpO2 97 %. General: NAD Neck: No JVD, no thyromegaly or thyroid nodule.  Lungs: Diminished breath sounds in right lower lung field, no Rales or wheezes. CV: Nondisplaced PMI. Regular rate and  rhythm, normal S1/S2, no S3/S4, no murmur.  No peripheral edema.  No carotid bruit.  Abdomen: Soft, nontender, no hepatosplenomegaly, no distention.  Skin: Intact without lesions or rashes.  Neurologic: Alert and oriented x 3.  Psych: Normal affect. Extremities: No clubbing or cyanosis.  HEENT: Normal.   ECG: Most recent ECG reviewed.   Labs:   Lab Results  Component Value Date   WBC 15.1 (H) 04/29/2016   HGB 9.8 (L) 04/29/2016   HCT 28.7 (L) 04/29/2016   MCV 102.9 (H) 04/29/2016   PLT 250 04/29/2016   No results for input(s): NA, K, CL, CO2, BUN, CREATININE, CALCIUM, PROT, BILITOT, ALKPHOS, ALT, AST, GLUCOSE in the last 168 hours.  Invalid input(s): LABALBU No results found for: CKTOTAL, CKMB, CKMBINDEX, TROPONINI No results found for: CHOL No results found for: HDL No results found for: LDLCALC No results found for: TRIG No results found for: CHOLHDL No results found for: LDLDIRECT       Studies: No results found.  ASSESSMENT AND PLAN:  1. Preoperative risk stratification with exertional dyspnea and abnormal ECG: Given his hypertension and end-stage renal disease, he is at a higher risk of having ischemic heart disease. Symptoms are both typical and atypical.  I will proceed with a nuclear myocardial perfusion imaging study to evaluate for ischemic heart disease (Lexiscan). I will order a 2-D echocardiogram with Doppler to evaluate cardiac structure, function, and regional wall motion.  2. Hypertension: I will increase amlodipine to 10 mg daily. He will likely need an additional antihypertensive agent.  3. Recurrent right pleural effusion: This is suspicious for malignancy. I will order the chest x-ray report from [place]: fu 6 weeks.   Signed: Kate Sable, M.D., F.A.C.C.  05/25/2016, 9:21 AM

## 2016-05-25 NOTE — Patient Instructions (Addendum)
Your physician recommends that you schedule a follow-up appointment in: [place]  Your physician has recommended you make the following change in your medication:   INCREASE AMLODIPINE 10 MG DAILY  Your physician has requested that you have an echocardiogram. Echocardiography is a painless test that uses sound waves to create images of your heart. It provides your doctor with information about the size and shape of your heart and how well your heart's chambers and valves are working. This procedure takes approximately one hour. There are no restrictions for this procedure.  Your physician has requested that you have a lexiscan myoview. For further information please visit [email or web address]. Please follow instruction sheet, as given.  Thank you for choosing [name]!!

## 2016-05-25 NOTE — Telephone Encounter (Signed)
Pre-cert Verification for the following procedure   Lexican & echo scheduled for 06/01/16 at [facility] .

## 2016-05-26 DIAGNOSIS — D509 Iron deficiency anemia, unspecified: Secondary | ICD-10-CM | POA: Diagnosis not present

## 2016-05-26 DIAGNOSIS — N2581 Secondary hyperparathyroidism of renal origin: Secondary | ICD-10-CM | POA: Diagnosis not present

## 2016-05-26 DIAGNOSIS — D631 Anemia in chronic kidney disease: Secondary | ICD-10-CM | POA: Diagnosis not present

## 2016-05-26 DIAGNOSIS — Z992 Dependence on renal dialysis: Secondary | ICD-10-CM | POA: Diagnosis not present

## 2016-05-26 DIAGNOSIS — N186 End stage renal disease: Secondary | ICD-10-CM | POA: Diagnosis not present

## 2016-05-28 DIAGNOSIS — Z992 Dependence on renal dialysis: Secondary | ICD-10-CM | POA: Diagnosis not present

## 2016-05-28 DIAGNOSIS — N2581 Secondary hyperparathyroidism of renal origin: Secondary | ICD-10-CM | POA: Diagnosis not present

## 2016-05-28 DIAGNOSIS — D631 Anemia in chronic kidney disease: Secondary | ICD-10-CM | POA: Diagnosis not present

## 2016-05-28 DIAGNOSIS — D509 Iron deficiency anemia, unspecified: Secondary | ICD-10-CM | POA: Diagnosis not present

## 2016-05-28 DIAGNOSIS — N186 End stage renal disease: Secondary | ICD-10-CM | POA: Diagnosis not present

## 2016-05-31 DIAGNOSIS — N186 End stage renal disease: Secondary | ICD-10-CM | POA: Diagnosis not present

## 2016-05-31 DIAGNOSIS — D509 Iron deficiency anemia, unspecified: Secondary | ICD-10-CM | POA: Diagnosis not present

## 2016-05-31 DIAGNOSIS — D631 Anemia in chronic kidney disease: Secondary | ICD-10-CM | POA: Diagnosis not present

## 2016-05-31 DIAGNOSIS — N2581 Secondary hyperparathyroidism of renal origin: Secondary | ICD-10-CM | POA: Diagnosis not present

## 2016-05-31 DIAGNOSIS — Z992 Dependence on renal dialysis: Secondary | ICD-10-CM | POA: Diagnosis not present

## 2016-06-01 ENCOUNTER — Ambulatory Visit (HOSPITAL_COMMUNITY)
Admission: RE | Admit: 2016-06-01 | Discharge: 2016-06-01 | Disposition: A | Discharge status: Home / Self Care | Payer: Medicare Other | Source: Ambulatory Visit | Attending: Cardiovascular Disease | Admitting: Cardiovascular Disease

## 2016-06-01 ENCOUNTER — Encounter (HOSPITAL_COMMUNITY)
Admission: RE | Admit: 2016-06-01 | Discharge: 2016-06-01 | Disposition: A | Discharge status: Home / Self Care | Payer: Medicare Other | Source: Ambulatory Visit | Attending: Cardiovascular Disease | Admitting: Cardiovascular Disease

## 2016-06-01 ENCOUNTER — Encounter (HOSPITAL_COMMUNITY): Payer: Self-pay

## 2016-06-01 ENCOUNTER — Inpatient Hospital Stay (HOSPITAL_COMMUNITY): Admission: RE | Admit: 2016-06-01 | Payer: Medicare Other | Source: Ambulatory Visit

## 2016-06-01 DIAGNOSIS — R0609 Other forms of dyspnea: Secondary | ICD-10-CM | POA: Diagnosis not present

## 2016-06-01 DIAGNOSIS — N186 End stage renal disease: Secondary | ICD-10-CM | POA: Diagnosis not present

## 2016-06-01 DIAGNOSIS — R9431 Abnormal electrocardiogram [ECG] [EKG]: Secondary | ICD-10-CM | POA: Insufficient documentation

## 2016-06-01 DIAGNOSIS — Z992 Dependence on renal dialysis: Secondary | ICD-10-CM | POA: Diagnosis not present

## 2016-06-01 DIAGNOSIS — Z01810 Encounter for preprocedural cardiovascular examination: Secondary | ICD-10-CM | POA: Insufficient documentation

## 2016-06-01 DIAGNOSIS — I313 Pericardial effusion (noninflammatory): Secondary | ICD-10-CM | POA: Insufficient documentation

## 2016-06-01 DIAGNOSIS — R9439 Abnormal result of other cardiovascular function study: Secondary | ICD-10-CM | POA: Diagnosis not present

## 2016-06-01 DIAGNOSIS — I340 Nonrheumatic mitral (valve) insufficiency: Secondary | ICD-10-CM | POA: Insufficient documentation

## 2016-06-01 DIAGNOSIS — I517 Cardiomegaly: Secondary | ICD-10-CM | POA: Diagnosis not present

## 2016-06-01 LAB — NM MYOCAR MULTI W/SPECT W/WALL MOTION / EF
CHL CUP NUCLEAR SRS: 0
CHL CUP NUCLEAR SSS: 0
LHR: 0.11
LV sys vol: 122 mL
LVDIAVOL: 201 mL (ref 62–150)
Peak HR: 71 {beats}/min
Rest HR: 46 {beats}/min
SDS: 0
TID: 0.93

## 2016-06-01 MED ORDER — REGADENOSON 0.4 MG/5ML IV SOLN
INTRAVENOUS | Status: AC
  Administered 2016-06-01: 0.4 mg via INTRAVENOUS
  Filled 2016-06-01: qty 5

## 2016-06-01 MED ORDER — SODIUM CHLORIDE 0.9% FLUSH
INTRAVENOUS | Status: AC
  Administered 2016-06-01: 10 mL via INTRAVENOUS
  Filled 2016-06-01: qty 10

## 2016-06-01 MED ORDER — TECHNETIUM TC 99M TETROFOSMIN IV KIT
10.0000 | PACK | Freq: Once | INTRAVENOUS | Status: AC | PRN
  Administered 2016-06-01: 10.9 via INTRAVENOUS

## 2016-06-01 MED ORDER — TECHNETIUM TC 99M TETROFOSMIN IV KIT
30.0000 | PACK | Freq: Once | INTRAVENOUS | Status: AC | PRN
  Administered 2016-06-01: 32 via INTRAVENOUS

## 2016-06-01 NOTE — Progress Notes (Signed)
*  PRELIMINARY RESULTS* Echocardiogram 2D Echocardiogram has been performed.  [name] 06/01/2016, 1:06 PM

## 2016-06-02 DIAGNOSIS — D631 Anemia in chronic kidney disease: Secondary | ICD-10-CM | POA: Diagnosis not present

## 2016-06-02 DIAGNOSIS — Z992 Dependence on renal dialysis: Secondary | ICD-10-CM | POA: Diagnosis not present

## 2016-06-02 DIAGNOSIS — N186 End stage renal disease: Secondary | ICD-10-CM | POA: Diagnosis not present

## 2016-06-02 DIAGNOSIS — N2581 Secondary hyperparathyroidism of renal origin: Secondary | ICD-10-CM | POA: Diagnosis not present

## 2016-06-02 DIAGNOSIS — D509 Iron deficiency anemia, unspecified: Secondary | ICD-10-CM | POA: Diagnosis not present

## 2016-06-03 ENCOUNTER — Telehealth: Payer: Self-pay | Admitting: Cardiovascular Disease

## 2016-06-03 NOTE — Telephone Encounter (Signed)
He can proceed. Can schedule fu with me in 6 months.

## 2016-06-03 NOTE — Telephone Encounter (Signed)
Left message to return call 

## 2016-06-03 NOTE — Telephone Encounter (Signed)
Notes recorded by Laurine Blazer, LPN on 04/02/3610 at 2:44 PM EDT Left message to return call.  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 No new blockages. Medical management.

## 2016-06-03 NOTE — Telephone Encounter (Signed)
Please confirm if patient is clear to proceed with surgery for peritoneal dialysis.  Do you need to see him back in office for follow up first ??

## 2016-06-03 NOTE — Telephone Encounter (Signed)
Wanting to know if patient has been cleared for cath to be placed

## 2016-06-03 NOTE — Telephone Encounter (Signed)
Notes recorded by Laurine Blazer, LPN on 03/07/4857 at 2:76 PM EDT Patient notified. Copy to pmd. Follow up scheduled for 07/06/2016 with Dr. Bronson Ing. ------

## 2016-06-04 DIAGNOSIS — N2581 Secondary hyperparathyroidism of renal origin: Secondary | ICD-10-CM | POA: Diagnosis not present

## 2016-06-04 DIAGNOSIS — N186 End stage renal disease: Secondary | ICD-10-CM | POA: Diagnosis not present

## 2016-06-04 DIAGNOSIS — D509 Iron deficiency anemia, unspecified: Secondary | ICD-10-CM | POA: Diagnosis not present

## 2016-06-04 DIAGNOSIS — D631 Anemia in chronic kidney disease: Secondary | ICD-10-CM | POA: Diagnosis not present

## 2016-06-04 DIAGNOSIS — Z992 Dependence on renal dialysis: Secondary | ICD-10-CM | POA: Diagnosis not present

## 2016-06-06 NOTE — Telephone Encounter (Signed)
Patient notified.  Fwd message to Dr. Romana Juniper with [facility].

## 2016-06-07 DIAGNOSIS — N186 End stage renal disease: Secondary | ICD-10-CM | POA: Diagnosis not present

## 2016-06-07 DIAGNOSIS — D509 Iron deficiency anemia, unspecified: Secondary | ICD-10-CM | POA: Diagnosis not present

## 2016-06-07 DIAGNOSIS — D631 Anemia in chronic kidney disease: Secondary | ICD-10-CM | POA: Diagnosis not present

## 2016-06-07 DIAGNOSIS — Z992 Dependence on renal dialysis: Secondary | ICD-10-CM | POA: Diagnosis not present

## 2016-06-07 DIAGNOSIS — N2581 Secondary hyperparathyroidism of renal origin: Secondary | ICD-10-CM | POA: Diagnosis not present

## 2016-06-09 DIAGNOSIS — N186 End stage renal disease: Secondary | ICD-10-CM | POA: Diagnosis not present

## 2016-06-09 DIAGNOSIS — D509 Iron deficiency anemia, unspecified: Secondary | ICD-10-CM | POA: Diagnosis not present

## 2016-06-09 DIAGNOSIS — N2581 Secondary hyperparathyroidism of renal origin: Secondary | ICD-10-CM | POA: Diagnosis not present

## 2016-06-09 DIAGNOSIS — Z992 Dependence on renal dialysis: Secondary | ICD-10-CM | POA: Diagnosis not present

## 2016-06-09 DIAGNOSIS — D631 Anemia in chronic kidney disease: Secondary | ICD-10-CM | POA: Diagnosis not present

## 2016-06-11 DIAGNOSIS — D631 Anemia in chronic kidney disease: Secondary | ICD-10-CM | POA: Diagnosis not present

## 2016-06-11 DIAGNOSIS — Z992 Dependence on renal dialysis: Secondary | ICD-10-CM | POA: Diagnosis not present

## 2016-06-11 DIAGNOSIS — N186 End stage renal disease: Secondary | ICD-10-CM | POA: Diagnosis not present

## 2016-06-11 DIAGNOSIS — D509 Iron deficiency anemia, unspecified: Secondary | ICD-10-CM | POA: Diagnosis not present

## 2016-06-11 DIAGNOSIS — N2581 Secondary hyperparathyroidism of renal origin: Secondary | ICD-10-CM | POA: Diagnosis not present

## 2016-06-14 DIAGNOSIS — Z992 Dependence on renal dialysis: Secondary | ICD-10-CM | POA: Diagnosis not present

## 2016-06-14 DIAGNOSIS — D631 Anemia in chronic kidney disease: Secondary | ICD-10-CM | POA: Diagnosis not present

## 2016-06-14 DIAGNOSIS — N186 End stage renal disease: Secondary | ICD-10-CM | POA: Diagnosis not present

## 2016-06-14 DIAGNOSIS — D509 Iron deficiency anemia, unspecified: Secondary | ICD-10-CM | POA: Diagnosis not present

## 2016-06-14 DIAGNOSIS — N2581 Secondary hyperparathyroidism of renal origin: Secondary | ICD-10-CM | POA: Diagnosis not present

## 2016-06-16 DIAGNOSIS — D509 Iron deficiency anemia, unspecified: Secondary | ICD-10-CM | POA: Diagnosis not present

## 2016-06-16 DIAGNOSIS — N2581 Secondary hyperparathyroidism of renal origin: Secondary | ICD-10-CM | POA: Diagnosis not present

## 2016-06-16 DIAGNOSIS — D631 Anemia in chronic kidney disease: Secondary | ICD-10-CM | POA: Diagnosis not present

## 2016-06-16 DIAGNOSIS — Z992 Dependence on renal dialysis: Secondary | ICD-10-CM | POA: Diagnosis not present

## 2016-06-16 DIAGNOSIS — N186 End stage renal disease: Secondary | ICD-10-CM | POA: Diagnosis not present

## 2016-06-18 DIAGNOSIS — D509 Iron deficiency anemia, unspecified: Secondary | ICD-10-CM | POA: Diagnosis not present

## 2016-06-18 DIAGNOSIS — N186 End stage renal disease: Secondary | ICD-10-CM | POA: Diagnosis not present

## 2016-06-18 DIAGNOSIS — Z992 Dependence on renal dialysis: Secondary | ICD-10-CM | POA: Diagnosis not present

## 2016-06-18 DIAGNOSIS — N2581 Secondary hyperparathyroidism of renal origin: Secondary | ICD-10-CM | POA: Diagnosis not present

## 2016-06-18 DIAGNOSIS — D631 Anemia in chronic kidney disease: Secondary | ICD-10-CM | POA: Diagnosis not present

## 2016-06-21 DIAGNOSIS — Z992 Dependence on renal dialysis: Secondary | ICD-10-CM | POA: Diagnosis not present

## 2016-06-21 DIAGNOSIS — D509 Iron deficiency anemia, unspecified: Secondary | ICD-10-CM | POA: Diagnosis not present

## 2016-06-21 DIAGNOSIS — N2581 Secondary hyperparathyroidism of renal origin: Secondary | ICD-10-CM | POA: Diagnosis not present

## 2016-06-21 DIAGNOSIS — N186 End stage renal disease: Secondary | ICD-10-CM | POA: Diagnosis not present

## 2016-06-21 DIAGNOSIS — R51 Headache: Secondary | ICD-10-CM | POA: Diagnosis not present

## 2016-06-21 DIAGNOSIS — D631 Anemia in chronic kidney disease: Secondary | ICD-10-CM | POA: Diagnosis not present

## 2016-06-21 DIAGNOSIS — M5020 Other cervical disc displacement, unspecified cervical region: Secondary | ICD-10-CM | POA: Diagnosis not present

## 2016-06-22 ENCOUNTER — Encounter (HOSPITAL_COMMUNITY): Payer: Self-pay | Admitting: *Deleted

## 2016-06-22 DIAGNOSIS — D2262 Melanocytic nevi of left upper limb, including shoulder: Secondary | ICD-10-CM | POA: Diagnosis not present

## 2016-06-22 DIAGNOSIS — D225 Melanocytic nevi of trunk: Secondary | ICD-10-CM | POA: Diagnosis not present

## 2016-06-22 DIAGNOSIS — L409 Psoriasis, unspecified: Secondary | ICD-10-CM | POA: Diagnosis not present

## 2016-06-22 DIAGNOSIS — D492 Neoplasm of unspecified behavior of bone, soft tissue, and skin: Secondary | ICD-10-CM | POA: Diagnosis not present

## 2016-06-22 DIAGNOSIS — D229 Melanocytic nevi, unspecified: Secondary | ICD-10-CM | POA: Diagnosis not present

## 2016-06-22 NOTE — Progress Notes (Signed)
Pt denies SOB, and chest pain. Pt has clearance for surgery in Epic from Dr. Bronson Ing, Cardiology. Pt denies having a cardiac cath. Pt made aware to stop taking Aspirin, vitamins, fish oil and herbal medications. Do not take any NSAIDs ie: Ibuprofen, Advil, Naproxen , BC and Goody Powder. Pt verbalized understanding of all pre-op instructions.

## 2016-06-23 DIAGNOSIS — N2581 Secondary hyperparathyroidism of renal origin: Secondary | ICD-10-CM | POA: Diagnosis not present

## 2016-06-23 DIAGNOSIS — Z992 Dependence on renal dialysis: Secondary | ICD-10-CM | POA: Diagnosis not present

## 2016-06-23 DIAGNOSIS — N186 End stage renal disease: Secondary | ICD-10-CM | POA: Diagnosis not present

## 2016-06-23 DIAGNOSIS — D509 Iron deficiency anemia, unspecified: Secondary | ICD-10-CM | POA: Diagnosis not present

## 2016-06-23 DIAGNOSIS — D631 Anemia in chronic kidney disease: Secondary | ICD-10-CM | POA: Diagnosis not present

## 2016-06-24 ENCOUNTER — Ambulatory Visit (HOSPITAL_COMMUNITY): Payer: Medicare Other | Admitting: Anesthesiology

## 2016-06-24 ENCOUNTER — Encounter (HOSPITAL_COMMUNITY): Payer: Self-pay | Admitting: Anesthesiology

## 2016-06-24 ENCOUNTER — Ambulatory Visit (HOSPITAL_COMMUNITY)
Admission: RE | Admit: 2016-06-24 | Discharge: 2016-06-24 | Disposition: A | Discharge status: Home / Self Care | Payer: Medicare Other | Source: Ambulatory Visit | Attending: Surgery | Admitting: Surgery

## 2016-06-24 ENCOUNTER — Encounter (HOSPITAL_COMMUNITY)
Admission: RE | Disposition: A | Discharge status: Home / Self Care | Payer: Self-pay | Source: Ambulatory Visit | Attending: Surgery

## 2016-06-24 DIAGNOSIS — N186 End stage renal disease: Secondary | ICD-10-CM | POA: Insufficient documentation

## 2016-06-24 DIAGNOSIS — I120 Hypertensive chronic kidney disease with stage 5 chronic kidney disease or end stage renal disease: Secondary | ICD-10-CM | POA: Insufficient documentation

## 2016-06-24 DIAGNOSIS — Z905 Acquired absence of kidney: Secondary | ICD-10-CM | POA: Insufficient documentation

## 2016-06-24 DIAGNOSIS — F419 Anxiety disorder, unspecified: Secondary | ICD-10-CM | POA: Diagnosis not present

## 2016-06-24 DIAGNOSIS — Z79899 Other long term (current) drug therapy: Secondary | ICD-10-CM | POA: Diagnosis not present

## 2016-06-24 DIAGNOSIS — Z4902 Encounter for fitting and adjustment of peritoneal dialysis catheter: Secondary | ICD-10-CM | POA: Diagnosis not present

## 2016-06-24 DIAGNOSIS — Z85528 Personal history of other malignant neoplasm of kidney: Secondary | ICD-10-CM | POA: Diagnosis not present

## 2016-06-24 DIAGNOSIS — Z992 Dependence on renal dialysis: Secondary | ICD-10-CM | POA: Insufficient documentation

## 2016-06-24 DIAGNOSIS — Z906 Acquired absence of other parts of urinary tract: Secondary | ICD-10-CM | POA: Insufficient documentation

## 2016-06-24 DIAGNOSIS — M1710 Unilateral primary osteoarthritis, unspecified knee: Secondary | ICD-10-CM | POA: Diagnosis not present

## 2016-06-24 DIAGNOSIS — Z9079 Acquired absence of other genital organ(s): Secondary | ICD-10-CM | POA: Insufficient documentation

## 2016-06-24 HISTORY — PX: CAPD INSERTION: SHX5233

## 2016-06-24 LAB — COMPREHENSIVE METABOLIC PANEL
ALBUMIN: 3.3 g/dL — AB (ref 3.5–5.0)
ALT: 10 U/L — ABNORMAL LOW (ref 17–63)
ANION GAP: 11 (ref 5–15)
AST: 12 U/L — AB (ref 15–41)
Alkaline Phosphatase: 63 U/L (ref 38–126)
BILIRUBIN TOTAL: 0.6 mg/dL (ref 0.3–1.2)
BUN: 32 mg/dL — AB (ref 6–20)
CO2: 27 mmol/L (ref 22–32)
Calcium: 8.5 mg/dL — ABNORMAL LOW (ref 8.9–10.3)
Chloride: 96 mmol/L — ABNORMAL LOW (ref 101–111)
Creatinine, Ser: 8.94 mg/dL — ABNORMAL HIGH (ref 0.61–1.24)
GFR calc Af Amer: 6 mL/min — ABNORMAL LOW (ref >60)
GFR calc non Af Amer: 5 mL/min — ABNORMAL LOW (ref >60)
GLUCOSE: 83 mg/dL (ref 65–99)
POTASSIUM: 4.9 mmol/L (ref 3.5–5.1)
SODIUM: 134 mmol/L — AB (ref 135–145)
Total Protein: 5.9 g/dL — ABNORMAL LOW (ref 6.5–8.1)

## 2016-06-24 LAB — PROTIME-INR
INR: 1.07
Prothrombin Time: 14 seconds (ref 11.4–15.2)

## 2016-06-24 LAB — CBC WITH DIFFERENTIAL/PLATELET
BASOS ABS: 0 10*3/uL (ref 0.0–0.1)
BASOS PCT: 0 %
EOS ABS: 0.3 10*3/uL (ref 0.0–0.7)
Eosinophils Relative: 4 %
HEMATOCRIT: 33.8 % — AB (ref 39.0–52.0)
HEMOGLOBIN: 11.6 g/dL — AB (ref 13.0–17.0)
Lymphocytes Relative: 16 %
Lymphs Abs: 1.2 10*3/uL (ref 0.7–4.0)
MCH: 35 pg — ABNORMAL HIGH (ref 26.0–34.0)
MCHC: 34.3 g/dL (ref 30.0–36.0)
MCV: 102.1 fL — ABNORMAL HIGH (ref 78.0–100.0)
MONO ABS: 0.7 10*3/uL (ref 0.1–1.0)
MONOS PCT: 9 %
NEUTROS ABS: 5.3 10*3/uL (ref 1.7–7.7)
NEUTROS PCT: 71 %
Platelets: 178 10*3/uL (ref 150–400)
RBC: 3.31 MIL/uL — ABNORMAL LOW (ref 4.22–5.81)
RDW: 13.7 % (ref 11.5–15.5)
WBC: 7.4 10*3/uL (ref 4.0–10.5)

## 2016-06-24 LAB — APTT: APTT: 38 s — AB (ref 24–36)

## 2016-06-24 SURGERY — LAPAROSCOPIC INSERTION CONTINUOUS AMBULATORY PERITONEAL DIALYSIS  (CAPD) CATHETER
Anesthesia: General | Site: Abdomen

## 2016-06-24 MED ORDER — FENTANYL CITRATE (PF) 100 MCG/2ML IJ SOLN
25.0000 ug | INTRAMUSCULAR | Status: DC | PRN
  Administered 2016-06-24 (×2): 25 ug via INTRAVENOUS

## 2016-06-24 MED ORDER — SUGAMMADEX SODIUM 200 MG/2ML IV SOLN
INTRAVENOUS | Status: DC | PRN
  Administered 2016-06-24: 200 mg via INTRAVENOUS

## 2016-06-24 MED ORDER — SODIUM CHLORIDE 0.9 % IR SOLN
Status: DC | PRN
  Administered 2016-06-24: 1000 mL

## 2016-06-24 MED ORDER — CHLORHEXIDINE GLUCONATE 4 % EX LIQD: 60.0000 mL | Freq: Once | CUTANEOUS | Status: DC

## 2016-06-24 MED ORDER — EPINEPHRINE PF 1 MG/ML IJ SOLN
INTRAMUSCULAR | Status: AC
  Filled 2016-06-24: qty 1

## 2016-06-24 MED ORDER — DOCUSATE SODIUM 100 MG PO CAPS: 100.0000 mg | ORAL_CAPSULE | Freq: Two times a day (BID) | ORAL | 0 refills | Status: AC

## 2016-06-24 MED ORDER — CEFAZOLIN SODIUM-DEXTROSE 2-4 GM/100ML-% IV SOLN
INTRAVENOUS | Status: AC
  Filled 2016-06-24: qty 100

## 2016-06-24 MED ORDER — ONDANSETRON HCL 4 MG/2ML IJ SOLN: 4.0000 mg | Freq: Once | INTRAMUSCULAR | Status: DC | PRN

## 2016-06-24 MED ORDER — OXYCODONE HCL 5 MG PO TABS: 5.0000 mg | ORAL_TABLET | Freq: Once | ORAL | Status: DC | PRN

## 2016-06-24 MED ORDER — MIDAZOLAM HCL 5 MG/5ML IJ SOLN
INTRAMUSCULAR | Status: DC | PRN
  Administered 2016-06-24 (×2): 1 mg via INTRAVENOUS

## 2016-06-24 MED ORDER — FENTANYL CITRATE (PF) 100 MCG/2ML IJ SOLN: 25.0000 ug | INTRAMUSCULAR | Status: DC | PRN

## 2016-06-24 MED ORDER — LIDOCAINE HCL (CARDIAC) 20 MG/ML IV SOLN
INTRAVENOUS | Status: DC | PRN
  Administered 2016-06-24: 100 mg via INTRAVENOUS

## 2016-06-24 MED ORDER — ROCURONIUM BROMIDE 100 MG/10ML IV SOLN
INTRAVENOUS | Status: DC | PRN
  Administered 2016-06-24: 40 mg via INTRAVENOUS

## 2016-06-24 MED ORDER — LIDOCAINE 2% (20 MG/ML) 5 ML SYRINGE
INTRAMUSCULAR | Status: AC
  Filled 2016-06-24: qty 5

## 2016-06-24 MED ORDER — ROCURONIUM BROMIDE 10 MG/ML (PF) SYRINGE
PREFILLED_SYRINGE | INTRAVENOUS | Status: AC
  Filled 2016-06-24: qty 10

## 2016-06-24 MED ORDER — EPHEDRINE SULFATE 50 MG/ML IJ SOLN
INTRAMUSCULAR | Status: DC | PRN
  Administered 2016-06-24 (×3): 10 mg via INTRAVENOUS

## 2016-06-24 MED ORDER — PROPOFOL 10 MG/ML IV BOLUS
INTRAVENOUS | Status: DC | PRN
  Administered 2016-06-24: 140 mg via INTRAVENOUS

## 2016-06-24 MED ORDER — ACETAMINOPHEN 500 MG PO TABS
1000.0000 mg | ORAL_TABLET | ORAL | Status: AC
  Administered 2016-06-24: 1000 mg via ORAL
  Filled 2016-06-24: qty 2

## 2016-06-24 MED ORDER — FENTANYL CITRATE (PF) 100 MCG/2ML IJ SOLN
INTRAMUSCULAR | Status: DC | PRN
  Administered 2016-06-24: 50 ug via INTRAVENOUS
  Administered 2016-06-24: 100 ug via INTRAVENOUS
  Administered 2016-06-24 (×2): 50 ug via INTRAVENOUS

## 2016-06-24 MED ORDER — CEFAZOLIN SODIUM-DEXTROSE 2-4 GM/100ML-% IV SOLN
2.0000 g | INTRAVENOUS | Status: AC
  Administered 2016-06-24: 2 g via INTRAVENOUS

## 2016-06-24 MED ORDER — BUPIVACAINE HCL (PF) 0.25 % IJ SOLN
INTRAMUSCULAR | Status: AC
  Filled 2016-06-24: qty 30

## 2016-06-24 MED ORDER — MIDAZOLAM HCL 2 MG/2ML IJ SOLN
INTRAMUSCULAR | Status: AC
  Filled 2016-06-24: qty 2

## 2016-06-24 MED ORDER — SODIUM CHLORIDE 0.9 % IV SOLN
INTRAVENOUS | Status: DC
  Administered 2016-06-24 (×2): via INTRAVENOUS

## 2016-06-24 MED ORDER — EPHEDRINE 5 MG/ML INJ
INTRAVENOUS | Status: AC
  Filled 2016-06-24: qty 20

## 2016-06-24 MED ORDER — SUCCINYLCHOLINE CHLORIDE 20 MG/ML IJ SOLN
INTRAMUSCULAR | Status: DC | PRN
  Administered 2016-06-24: 120 mg via INTRAVENOUS

## 2016-06-24 MED ORDER — OXYCODONE HCL 5 MG/5ML PO SOLN: 5.0000 mg | Freq: Once | ORAL | Status: DC | PRN

## 2016-06-24 MED ORDER — HYDROCODONE-ACETAMINOPHEN 5-325 MG PO TABS: 1.0000 | ORAL_TABLET | Freq: Four times a day (QID) | ORAL | 0 refills | Status: DC | PRN

## 2016-06-24 MED ORDER — FENTANYL CITRATE (PF) 100 MCG/2ML IJ SOLN
INTRAMUSCULAR | Status: AC
  Filled 2016-06-24: qty 2

## 2016-06-24 MED ORDER — BUPIVACAINE HCL (PF) 0.25 % IJ SOLN
INTRAMUSCULAR | Status: DC | PRN
  Administered 2016-06-24: 10 mL

## 2016-06-24 MED ORDER — FENTANYL CITRATE (PF) 250 MCG/5ML IJ SOLN
INTRAMUSCULAR | Status: AC
  Filled 2016-06-24: qty 5

## 2016-06-24 MED ORDER — PROPOFOL 10 MG/ML IV BOLUS
INTRAVENOUS | Status: AC
  Filled 2016-06-24: qty 20

## 2016-06-24 MED ORDER — ONDANSETRON HCL 4 MG/2ML IJ SOLN
INTRAMUSCULAR | Status: AC
  Filled 2016-06-24: qty 2

## 2016-06-24 MED ORDER — ONDANSETRON HCL 4 MG/2ML IJ SOLN
INTRAMUSCULAR | Status: DC | PRN
  Administered 2016-06-24: 4 mg via INTRAVENOUS

## 2016-06-24 SURGICAL SUPPLY — 38 items
ADAPTER TITANIUM MEDIONICS (MISCELLANEOUS) ×2 IMPLANT
ADH SKN CLS LQ APL DERMABOND (GAUZE/BANDAGES/DRESSINGS) ×1
BAG DECANTER FOR FLEXI CONT (MISCELLANEOUS) ×2 IMPLANT
BLADE CLIPPER SURG (BLADE) IMPLANT
CANISTER SUCT 3000ML PPV (MISCELLANEOUS) ×2 IMPLANT
CATH EXTENDED DIALYSIS (CATHETERS) ×2 IMPLANT
CHLORAPREP W/TINT 26ML (MISCELLANEOUS) ×2 IMPLANT
COVER SURGICAL LIGHT HANDLE (MISCELLANEOUS) ×2 IMPLANT
DECANTER SPIKE VIAL GLASS SM (MISCELLANEOUS) ×2 IMPLANT
DERMABOND ADHESIVE PROPEN (GAUZE/BANDAGES/DRESSINGS) ×1
DERMABOND ADVANCED .7 DNX6 (GAUZE/BANDAGES/DRESSINGS) ×1 IMPLANT
DRSG TEGADERM 4X4.75 (GAUZE/BANDAGES/DRESSINGS) ×4 IMPLANT
ELECT REM PT RETURN 9FT ADLT (ELECTROSURGICAL) ×2
ELECTRODE REM PT RTRN 9FT ADLT (ELECTROSURGICAL) ×1 IMPLANT
GAUZE SPONGE 4X4 12PLY STRL LF (GAUZE/BANDAGES/DRESSINGS) ×2 IMPLANT
GLOVE BIOGEL PI IND STRL 6.5 (GLOVE) ×3 IMPLANT
GLOVE BIOGEL PI INDICATOR 6.5 (GLOVE) ×3
GLOVE ECLIPSE 6.5 STRL STRAW (GLOVE) ×2 IMPLANT
GLOVE SURG SS PI 6.5 STRL IVOR (GLOVE) ×2 IMPLANT
GOWN STRL REUS W/ TWL LRG LVL3 (GOWN DISPOSABLE) ×3 IMPLANT
GOWN STRL REUS W/TWL LRG LVL3 (GOWN DISPOSABLE) ×6
KIT BASIN OR (CUSTOM PROCEDURE TRAY) ×2 IMPLANT
KIT ROOM TURNOVER OR (KITS) ×2 IMPLANT
NS IRRIG 1000ML POUR BTL (IV SOLUTION) ×2 IMPLANT
PAD ARMBOARD 7.5X6 YLW CONV (MISCELLANEOUS) ×2 IMPLANT
SET EXT 12IN DIALYSIS STAY-SAF (MISCELLANEOUS) ×2 IMPLANT
SET IRRIG TUBING LAPAROSCOPIC (IRRIGATION / IRRIGATOR) IMPLANT
SLEEVE ENDOPATH XCEL 5M (ENDOMECHANICALS) ×2 IMPLANT
STYLET FALLER (MISCELLANEOUS) ×4 IMPLANT
STYLET FALLER MEDIONICS (MISCELLANEOUS) ×2 IMPLANT
SUT MNCRL AB 4-0 PS2 18 (SUTURE) ×2 IMPLANT
TAPE CLOTH SURG 4X10 WHT LF (GAUZE/BANDAGES/DRESSINGS) ×2 IMPLANT
TOWEL OR 17X24 6PK STRL BLUE (TOWEL DISPOSABLE) ×2 IMPLANT
TOWEL OR 17X26 10 PK STRL BLUE (TOWEL DISPOSABLE) ×2 IMPLANT
TRAY LAPAROSCOPIC MC (CUSTOM PROCEDURE TRAY) ×2 IMPLANT
TROCAR XCEL NON BLADE 8MM B8LT (ENDOMECHANICALS) ×2 IMPLANT
TROCAR XCEL NON-BLD 5MMX100MML (ENDOMECHANICALS) ×2 IMPLANT
TUBING INSUFFLATION (TUBING) ×2 IMPLANT

## 2016-06-24 NOTE — Op Note (Signed)
Operative Note  Jesus [name]  [id number]  [id number]  06/24/2016   Surgeon: Clovis Riley  Assistant: OR staff  Procedure performed: laparoscopic insertion of peritoneal dialysis catheter  Preop diagnosis: end stage renal disease secondary to bilateral nephrectomies for cancer Post-op diagnosis/intraop findings: same. No intraabdominal adhesions.   Specimens: none Retained items: peritoneal dialysis catheter. pigtail in pelvis. One cuff in the rectus muscle. Two cuffs in the subcutaneous tissue. Titanium connector adjoining the two parts of the catheter, in the subcutaneous tissue.  EBL: <19JK Complications: none  Description of procedure: After obtaining informed consent the patient was taken to the operating room and placed supine on operating room table wheregeneral endotracheal anesthesia was initiated, preoperative antibiotics were administered, SCDs applied, and a formal timeout was performed. The patient's abdomen was prepped and draped in usual sterile fashion. The peritoneal cavity was entered using Optiview technique in the left upper quadrant and insufflated to 15 mmHg without incident. Gross inspection revealed no evidence of injury from our entry. There were no intra-abdominal adhesions. A left lateral 5 mm trocar was introduced, and then an 8 mm trocar was introduced just to the left of the umbilicus, angled at a 45 towards the pelvis and slightly tunneling before entering the peritoneum. The stylet was placed into the indwelling portion of the peritoneal dialysis catheter to straighten it out and then this was directed through the 8 mm trocar into the pelvis where the pigtail was curled and sat nicely in the low pelvis. The stylet was removed as was the 8 mm trocar in the cuff on the catheter was adjusted to sit in the rectus muscle just to the left of the umbilicus. This was confirmed both visually with the laparoscope as well as manually with palpation. The  subcutaneous portion of the catheter was then brought into the field and the ends of this as well as of the indwelling portion of the catheter were trimmed and the titanium connector was then inserted into each end to connect the indwelling in the subcutaneous portions of the catheter. An incision was then made in the left superior abdomen a couple of centimeters below our left upper quadrant trocar and a sharp tunneler was used to create a subcutaneous tunnel from the site just left of the umbilicus to this incision. The catheter was connected to the tunneler and then brought through this incision ensuring no twists or kink. The left lateral 5 mm trocar was then removed and the tunneler was reinserted into the left upper quadrant incision, tunneled to the subcutaneous tissue, and brought out through this trocar site incision. Again the catheter was elevated and it was confirmed that there was no kink or twist. Laparoscopic visualization once again confirmed appropriate location of the curled tip of the catheter in the pelvis and the innermost cuff within the rectus muscle of the abdominal wall. The external titanium connector was then affixed to the catheter and the catheter was flushed and aspirated easily. This was then connected to the third, completely external component of the catheter. No sutures were placed around the catheter itself. Appropriate positioning was once more confirmed with laparoscopic visualization. The camera was removed, the abdomen desufflated and the final remaining trocar removed. The 3 skin incisions were closed with subcuticular Monocryl and Dermabond. A bulky dressing of 4 x 4's was placed around the catheter exit site. The catheter was curled up to the top these 4 x 4's, covered with more gauze, and then a tape dressing was  placed over all of this completely covering up the catheter. The patient was then awakened, extubated and taken to PACU in stable condition.   All counts were  correct at the completion of the case.

## 2016-06-24 NOTE — Discharge Instructions (Signed)
PERITONEAL DIALYSIS (CAPD) CATHETER PLACEMENT: ° °POST OPERATIVE INSTRUCTIONS ° °FOLLOW UP with the Peritoneal Dialysis Nurses ° [address] [place], [place] [address] ° °Call [phone] or your neprologist to help arrange training/flushes of your CAPD catheter °  °The CAPD nurses & Nephrology usually follow you closely, making the need for follow-up in the [facility] surgery office redundant and therefore not always needed.  If they or you have concerns, please call us for possible follow-up in our office ° °1. Do NOT shower until dialysis nursing staff have advised.  Do NOT submerge in a bathtub or hot tub. °2. Your Home Therapy RN will advise and educate you on showering, bathing, and swimming when you are in training. °3. The Peritoneal Dialysis nurse will remove your waterproof bandages in the Dialysis Center a few days after surgery.  Do not remove the bandages until seen by them.  If you dressing becomes wet, saturated, or falls off call your Home Therapy RN. °4. ACTIVITIES as tolerated:   °a. You may resume regular (light) daily activities beginning the next day--such as daily self-care, walking, climbing stairs--gradually increasing activities as tolerated.  If you can walk 30 minutes without difficulty, it is safe to try more intense activity such as jogging, treadmill, bicycling, low-impact aerobics,  etc. °b. No swimming within the 1st month of catheter placement.  You must have a Dr's order. °c. Save the most intensive and strenuous activity for last such as sit-ups, heavy lifting, contact sports, etc  Refrain from any heavy lifting or straining until you are off narcotics for pain control.   °d. DO NOT PUSH THROUGH PAIN.  Let pain be your guide: If it hurts to do something, don't do it.  Pain is your body warning you to avoid that activity for another week until the pain goes down. °e. You may drive when you are no longer taking prescription pain medication, you can comfortably wear a seatbelt, and you can  safely maneuver your car and apply brakes. °f. You may have sexual intercourse when it is comfortable.  °g. Be sure your catheter is taped to Your abdomen nor injured. Did not allow catheter to ankle, or tension on the catheter-it may cause damage to skin over the catheter °h. You will be instructed on what to do an emergency, and will have access to on call our in 24 hours a day. The number to reach the home therapy nurse as listed below. °5. DIET: Follow a light bland diet the first 24 hours after arrival home, such as soup, liquids, crackers, etc.  Be sure to include lots of fluids daily.  Avoid fast food or heavy meals as your are more likely to get nauseated.   °6. Take your usually prescribed home medications unless otherwise directed. °7. PAIN CONTROL: °a. Pain is best controlled by a usual combination of three different methods TOGETHER: °i. Ice/Heat °ii. Tylenol (over the counter pain medication) °iii. Prescription pain medication °b. Most patients will experience some swelling and bruising around the incisions.  Ice packs or heating pads (30-60 minutes up to 6 times a day) will help. Use ice for the first few days to help decrease swelling and bruising, then switch to heat to help relax tight/sore spots and speed recovery.  Some people prefer to use ice alone, heat alone, alternating between ice & heat.  Experiment to what works for you.  Swelling and bruising can take several weeks to resolve.   °c. It is helpful to take   an over-the-counter pain medication regularly for the first few weeks.  Using acetaminophen (Tylenol, etc) 500-650mg four times a day (every meal & bedtime) is usually safest since NSAIDs are not advisable in patients with kidney disease. °d. A  prescription for pain medication (such as oxycodone, hydrocodone, etc) should be given to you upon discharge.  Take your pain medication as prescribed.  °i. If you are having problems/concerns with the prescription medicine (does not control pain,  nausea, vomiting, rash, itching, etc), please call us [phone] to see if we need to switch you to a different pain medicine that will work better for you and/or control your side effect better. °ii. If you need a refill on your pain medication, please contact your pharmacy.  They will contact our office to request authorization. Prescriptions will not be filled after 5 pm or on week-ends. °8. Avoid getting constipated.  Between the surgery and the pain medications, it is common to experience some constipation.  Increasing fluid intake and taking a fiber supplement (such as Metamucil, Citrucel, FiberCon, MiraLax, etc) 1-2 times a day regularly will usually help prevent this problem from occurring.  A mild laxative (prune juice, Milk of Magnesia, MiraLax, etc) should be taken according to package directions if there are no bowel movements after 48 hours.  ° ° ° ° ° °FOLLOW UP: °Peritoneal Dialysis nurses ° [address] [place], [place] [address] ° °Call [phone] or your neprologist to help arrange training/flushes of your CAPD catheter °  ° -The CAPD nurses & Nephrology usually follow you closely, making the need for follow-up in our office redundant and therefore not needed.  If they or you have concerns, please call us for possible follow-up in our office ° ° -Please call [facility] at [phone] only as needed. ° °WHEN TO CALL US [phone]: °1. Poor pain control °2. Reactions / problems with new medications (rash/itching, nausea, etc)  °3. Fever over 101.5 F (38.5 C) °4. Worsening swelling or bruising °5. Continued bleeding from incision. °6. Increased pain, redness, or drainage from the incision ° ° The clinic staff is available to answer your questions during regular business hours (8:30am-5pm).  Please don’t hesitate to call and ask to speak to one of our nurses for clinical concerns.  ° If you have a medical emergency, go to the nearest emergency room or call 911. ° A surgeon from [facility] is always on call at the hospitals ° °9. IF YOU HAVE DISABILITY OR FAMILY LEAVE FORMS, BRING THEM TO THE OFFICE FOR PROCESSING.  DO NOT GIVE THEM TO YOUR DOCTOR. ° °[facility] °[address], [address], [place], [place]  [address] ? °MAIN: [phone] ? TOLL FREE: [phone] ?  °FAX [phone] °[email or web address] ° °Peritoneal Dialysis - An Overview °Dialysis can be done using a machine outside of the body (hemodialysis). Or, it can be done inside the body (peritoneal dialysis). The word "peritoneal" refers to the lining or membrane of the belly (abdominal cavity). The peritoneal membrane is a thin, plastic-like lining inside the belly that covers the organs and fits in the abdominal or peritoneal cavity, such as the stomach, liver and the kidneys. This lining works like a filter. It will allow certain things to pass from your blood through the lining and into a special solution that has been placed into your belly. In this type of dialysis, the peritoneum is used to help clean the blood.  °If you need dialysis, your   kidneys are not working right. Healthy kidneys take out extra water and waste products, which becomes urine. When the kidneys do not do this, serious problems can develop. The waste and water build up in the blood. Your hands and feet might swell. You may feel tired, weak or sick to your stomach. Also, your blood pressure may rise. If not treated, you could die. Dialysis is a treatment that does the work that your kidneys would do if they were healthy. °· It cleans your blood.  °· It will make sure your body has the right amount of certain chemicals that it needs. They include potassium, sodium and bicarbonate.  °· It will help control your blood pressure.  °UNDERSTANDING PERITONEAL DIALYSIS °· Here is how peritoneal dialysis works:  °· First, you will have surgery to put a soft plastic tube (catheter) into your belly (abdomen). This will allow you  to easily connect yourself to special tubing, which will then let a special dialysis solution to be placed into your abdomen.  °· For each treatment, you will need at least one bag of dialysis solution (a liquid called dialysate). It is a mix of water that is pure and free of germs (sterile), sugar (dextrose) and the nutrients and minerals found in your blood. Sometimes, more than one bag is needed to get the right amount of fluid for your abdomen. Your caregiver will explain what size and how many bags you will need.  °· The dialysate is slowly put through the catheter to fill the abdomen (called the peritoneal cavity). This dialysate will need to stay in your body for 3-4 hours. This is known as the dwell time.  °· The solution is working to clean the blood and remove wastes from your body. At the end of this time, the solution is drained from your body through tubing into an empty bag. It is then replaced with a fresh dialysate.  °· The draining and replacing of the dialysate is called an exchange or cycle. The catheter is capped after each exchange. Once the solution is in your body, you are then free to do whatever you would like until the next exchange. Most people will need to do 4-5 exchanges each day.  °· There are two different methods that can be used.  °· Continuous ambulatory peritoneal dialysis (CAPD): You put the solution into your abdomen, cap your catheter and then go about your day. Several hours later, you reconnect to a tubing set up, drain out the solution and then put more solution in. This is done several times a day. No machine is needed.  °· Continuous cycler-assisted peritoneal dialysis (CCPD): A machine is used, which fills the abdomen with dialysate and then drains it. This happens several times. It usually is done at night while you are sleeping. When you wake up, you can disconnect from the machine and are free to go to go about your day.  °PREPARING FOR EXCHANGES °· Discuss the details  of the procedure with your caregivers. You will be working with a nurse who is specially trained in doing dialysis. Make sure you understand:  °· How to do an exchange.  °· How much solution you need.  °· What type of solution you will need.  °· How often you should do an exchange. Ask:  °· How many times each day?  °· When? At meals? At bedtime?  °· Always keep the dialysate bags and other supplies in a cool, clean and dry place.  °·   Keeping everything clean is very important.  °· The catheter and its cap must be free from germs (sterile)  °· The adapter also must be sterile. It attaches the dialysis bag and tubing to the catheter.  °· Clean the area of your body around the catheter every day. Use a chemical that fights infection (antiseptic).  °· Wash your hands thoroughly before starting an exchange.  °· You may be taught to wear a mask to cover your nose and mouth. This makes infection less likely to happen.  °· You may be taught to close doors, windows and turn off any fans before doing an exchange.  °· Check the dialysate bag very carefully.  °· Make sure it is the right size bag for you. This information is on the label.  °· Also, make sure it is the right mixture. For some people, the dialysate contents vary. For instance, the mixture might be a stronger solution for overnight.  °· Check the expiration date (the last date you can use the bag). It also is on the label. If the date has gone by, throw away the bag.  °· The solution should be clear. You should be able to see any writing on the side of the bag clearly through the solution. Do not use a cloudy solution.  °· Gently squeeze the bag to make sure there are no leaks.  °· Use a dry heating pad to warm the dialysate in the bag. Leave the cover on the bag while you do this.  °· This is for comfort. You can skip this step if you want.  °· Never place the bag of solution under warm or hot water. Water from a faucet is not sterile and could cause germs to  get into the bag. Infection could then result.  °PERFORMING AN EXCHANGE °· For continuous ambulatory dialysis:  °· Attach the dialysis bag and tubing to your catheter. Hang the bag so that gravity (the natural downward pull) draws the solution down and into your abdomen once the clamps are opened. This should take about 10 minutes.  °· Remove the bag and tubing from the catheter. Cap the catheter.  °· The solution stays in the abdomen for 3-4 hours (dwell time). The solution is working to clean the blood and remove wastes from your body.  °· When you are ready to drain the solution for another exchange, take the cap off the catheter. Then, attach the catheter to tubing, which is attached to an empty bag. Place this empty bag below the abdomen or on the floor or stool and undo the clamps.  °· Gravity helps pull the fluid out of the abdomen and into the bag. The fluid in the bag may look yellow and clear, like urine. It usually takes about 20 minutes to drain the fluid out of the abdomen.  °· When the solution has drained, start the process again by infusing a new bag of dialysate and then capping the catheter.  °· This should continue until you have used all of the solution that you are to use each day.  °· Sometimes, a small machine is used overnight. It is called a mini-cycler. This is done if the body cannot go all night without an exchange. The machine lets you sleep without having to get up and do an exchange.  °· For continuous cycler-assisted dialysis:  °· You will be taught how to set up or program your machine.  °· When you are ready for bed,   put the dialysate bags onto the cycler machine. Put on exactly the number of bags that your caregiver said to use.  °· Connect your catheter to the machine and turn the cycler machine on.  °· Overnight, the cycler will do several exchanges. It often does three to five, sometimes more.  °· Solution that is in your abdomen in the morning will stay during the day. The  machine is set to make the daytime solution stronger, if that is needed.  °· In the morning, you will disconnect from the machine and cap your catheter and go about your day.  °· Sometimes, an extra exchange is done during the day. This may be needed to remove excess waste or fluid.  °IMPORTANT REMINDERS °· You will need to follow a very strict schedule. Every step of the dialysis procedure must be done every day. Sometimes, several times a day. Altogether, this might take an extra 2 hours or more. However, you must stick to the routine. Do not skip a day. Do not skip a procedure.  °· Some people find it helpful to work with a counselor or social worker in addition to the renal (kidney) nurse. They can help you figure out how to change your daily routine to fit in the dialysis sessions.  °· You may need to change your diet. Ask your caregiver for advice, or talk with a nutritionist about what you should and should not eat.  °· You will need to weigh yourself every day and keep track of what your weight is.  °· You may be taught how to check your blood pressure before every exchange. Your blood pressure reading will help determine what type of solution to use. If your blood pressure is too high, you may need a stronger solution.  °RISKS AND COMPLICATIONS  °Possible problems vary, depending on the method you use. Your overall health also can have an effect. Problems that could develop because of dialysis include: °· Infection. This is the most common problem. It could occur:  °· In the peritoneum. This is called peritonitis.  °· Around the catheter.  °· Weight gain. The dialysate contains a type of sugar known as dextrose. Dextrose has a lot of calories. The body takes in several hundred calories from this sugar each day.  °· Weakened muscles in the abdomen. This can result from all of the fluid that your body has to hold in the abdomen.  °· Catheter replacement. Sometimes, a new one has to be put in.  °· Change in  dialysis method. Due to some complications, you may need to change to hemodialysis for a short time and have your dialysis done at a center.  °· Trouble adjusting to your new lifestyle. In some people, this leads to depression.  °· Sleep problems.  °· Dialysis-related amyloidosis. This sometimes occurs after 5 years of dialysis. Protein builds up in the blood. This can cause painful deposits on bones, joints and tendons (which connect muscle to bone). Or, it can cause hollow spots in bones that make them more likely to break.  °· Excess fluid. Your body may absorb too much of the fluid that is held in the abdomen. This can lead to heart or lung problems.  °SEEK MEDICAL CARE IF:  °· You have any problems with an exchange.  °· The area around the catheter becomes red or painful.  °· The catheter seems loose, or it feels like it is coming out.  °· A bag of dialysate looks   cloudy. Or, the liquid is an unusual color.  °· Abdominal pain or discomfort.  °· You feel sick to your stomach (nauseous) or throw up (vomit).  °· You develop a fever of more than 102° F (38.9° C).  °SEEK IMMEDIATE MEDICAL CARE IF:  °You develop a fever of more than 102° F (38.9° C). °Document Released: 12/12/2008 Document Revised: 02/03/2011 Document Reviewed: 12/12/2008 °ExitCare® Patient Information ©2012 [employer]. ° °Diet for Peritoneal Dialysis °This diet may be modified in protein, sodium, phosphorus, potassium, or fluid, depending on your needs. The goals of nutrition therapy are similar to those for patients on hemodialysis. Providing enough protein to replace peritoneal losses is a priority. °USES OF THIS DIET °The diet is designed for the patient with end-stage kidney (renal) disease, who is treated by peritoneal dialysis. Treatment options include: °· Continuous Ambulatory Peritoneal Dialysis (CAPD): Usually 4 exchanges of 1.5 to 2 liter volumes of glucose (sugar) and electrolyte-containing dialysate.  °· Continuous Cyclic Peritoneal  Dialysis (CCPD): Essentially a reversal of CAPD, with shorter exchanges at night and a longer one during the day.  °· Intermittent Peritoneal Dialysis (IPD): 10 to 12 hours of exchanges, 2 to 3 times weekly.  °ADEQUACY °The diet may not meet the Recommended Dietary Allowances of the National Research Council for calcium and ascorbic acid. Protein and water-soluble vitamin needs may be increased because of losses into the dialysate. Recommended daily supplements are the same as for hemodialysis patients. °ASSESSMENT/DETERMINATION OF DIET °Dietary needs will differ between patients. Parameters must be individualized. °Protein °· Guidelines: 1.2 to 1.3 gm/kg/day OR 1.5 gm/kg/day if patient is malnourished, catabolic, or has a protracted episode of peritonitis. A minimum of 50% of the protein intake should be of high biological value.  °· Goals: Meet protein requirements and replace dialysate losses while avoiding excessive accumulation of waste products. Achieve serum albumin greater than 3.5 g/dL.  °· Evaluate: Current nutritional status, serum albumin and BUN levels, presence of peritonitis.  °Sodium °· Guidelines: Usually 90 to 175 mEq (2000 to 4000 mg), but should be individualized.  °· Goals: Minimize complications of fluid imbalance.  °· Evaluate: Weight, blood pressure regulation, and presence of swelling (edema).  °Potassium °· Guidelines: Individualized; often not restricted, and may need to be supplemented.  °· Goals: Serum K+ levels between 4.0 to 5.0 mEq/L.  °· Evaluate: Serum K+ levels, usual intake of K+, appetite.  °Phosphorus °· Guidelines: 800 to 1200 mg/day (the high protein intake results in a high obligatory P intake).  °· Goal: Serum P levels between 4.5 to 6.0 mg/dL.  °· Evaluate: Serum P levels, usual P intake, P-binding medications: type, number, dosage, distribution.  °Fluids °· Guidelines: Individualized - may not be restricted for all patients.  °· Goal: Minimize complications of fluid  imbalance.  °· Evaluate: Weight, blood pressure regulation, sodium intake, and presence of edema.  °Document Released: 02/14/2005 Document Revised: 02/03/2011 Document Reviewed: 05/09/2006 °ExitCare® Patient Information ©2012 [employer]. °

## 2016-06-24 NOTE — Anesthesia Procedure Notes (Signed)
Procedure Name: Intubation Date/Time: 06/24/2016 11:37 AM Performed by: Rebekah Chesterfield L Pre-anesthesia Checklist: Patient identified, Emergency Drugs available, Suction available and Patient being monitored Patient Re-evaluated:Patient Re-evaluated prior to inductionOxygen Delivery Method: Circle System Utilized Preoxygenation: Pre-oxygenation with 100% oxygen Intubation Type: IV induction Ventilation: Mask ventilation without difficulty Laryngoscope Size: Mac and 4 Grade View: Grade II Tube type: Oral Tube size: 8.0 mm Number of attempts: 1 Airway Equipment and Method: Stylet Placement Confirmation: ETT inserted through vocal cords under direct vision,  positive ETCO2 and breath sounds checked- equal and bilateral Secured at: 22 cm Tube secured with: Tape Dental Injury: Teeth and Oropharynx as per pre-operative assessment

## 2016-06-24 NOTE — H&P (Signed)
Jesus [name] Patient #: [id number] DOB: 1951/01/04 Widowed / Language: Cleophus Molt / Race: White Male  History of Present Illness  Patient words: This is a very nice 66 year old patient who is on hemodialysis Tuesday Thursday and Saturday status post bilateral nephroureterectomy and cystoprostatectomy (right-sided 2014, left side and bladder robotically in February 2017) for TCC. [facility] The operative for the most recent surgery does not describe any intra-abdominal adhesive disease. He desires peritoneal dialysis as he really does not like going to dialysis clinic. He has received clearance from his urologist as well as his nephrologist to pursue PD, and understands that he has to manage it on a daily basis at home. He lives alone with 2 dogs.   Past Surgical History  Cataract Surgery Bilateral. Colon Polyp Removal - Colonoscopy Knee Surgery Bilateral. Nephrectomy Bilateral. Shoulder Surgery Bilateral. Spinal Surgery - Neck TURP  Diagnostic Studies History  Colonoscopy 5-10 years ago  Allergies No Known Drug Allergies 04/27/2016  Medication History \ ALPRAZolam (1MG  Tablet, Oral) Active. AmLODIPine Besylate (2.5MG  Tablet, Oral) Active. Fluticasone Propionate (50MCG/ACT Suspension, Nasal) Active. Metoprolol Succinate ER (50MG  Tablet ER 24HR, Oral) Active. Oxycodone-Acetaminophen (5-325MG  Tablet, Oral) Active. Polyethyl Glycol-Propyl Glycol (0.4-0.3% Gel, Ophthalmic) Active. Renvela (800MG  Tablet, Oral) Active. Medications Reconciled  Social History Alcohol use Occasional alcohol use. Caffeine use Coffee. No drug use Tobacco use Never smoker.  Family History  Arthritis Father, Mother. Hypertension Father.  Other Problems  Anxiety Disorder Arthritis Back Pain Cancer Chronic Renal Failure Syndrome High blood pressure     Review of Systems  General Present- Chills and Fatigue. Not Present- Appetite  Loss, Fever, Night Sweats, Weight Gain and Weight Loss. Skin Present- Dryness. Not Present- Change in Wart/Mole, Hives, Jaundice, New Lesions, Non-Healing Wounds, Rash and Ulcer. HEENT Present- Ringing in the Ears and Sinus Pain. Not Present- Earache, Hearing Loss, Hoarseness, Nose Bleed, Oral Ulcers, Seasonal Allergies, Sore Throat, Visual Disturbances, Wears glasses/contact lenses and Yellow Eyes. Respiratory Not Present- Bloody sputum, Chronic Cough, Difficulty Breathing, Snoring and Wheezing. Breast Not Present- Breast Mass, Breast Pain, Nipple Discharge and Skin Changes. Cardiovascular Present- Difficulty Breathing Lying Down. Not Present- Chest Pain, Leg Cramps, Palpitations, Rapid Heart Rate, Shortness of Breath and Swelling of Extremities. Gastrointestinal Present- Bloating and Excessive gas. Not Present- Abdominal Pain, Bloody Stool, Change in Bowel Habits, Chronic diarrhea, Constipation, Difficulty Swallowing, Gets full quickly at meals, Hemorrhoids, Indigestion, Nausea, Rectal Pain and Vomiting. Male Genitourinary Not Present- Blood in Urine, Change in Urinary Stream, Frequency, Impotence, Nocturia, Painful Urination, Urgency and Urine Leakage. Musculoskeletal Present- Back Pain and Joint Pain. Not Present- Joint Stiffness, Muscle Pain, Muscle Weakness and Swelling of Extremities. Neurological Present- Headaches. Not Present- Decreased Memory, Fainting, Numbness, Seizures, Tingling, Tremor, Trouble walking and Weakness. Psychiatric Present- Anxiety. Not Present- Bipolar, Change in Sleep Pattern, Depression, Fearful and Frequent crying. Endocrine Not Present- Cold Intolerance, Excessive Hunger, Hair Changes, Heat Intolerance, Hot flashes and New Diabetes. Hematology Not Present- Blood Thinners, Easy Bruising, Excessive bleeding, Gland problems, HIV and Persistent Infections.  Vitals:   06/24/16 0915  BP: (!) 150/72  Pulse: (!) 44  Resp: 20  Temp: 98 F (36.7 C)      Physical  Exam General Note: Alert and oriented no distress  Integumentary Note: No lesions or rashes on limited skin exam  Head and Neck Note: No mass or thyromegaly  Eye Note: Anicteric, extraocular motion intact  ENMT Note: Moist mucous membranes, good dentition  Chest and Lung Exam Note: Unlabored respirations, symmetrical air  entry  Cardiovascular Note: Regular rate and rhythm, no pedal edema  Abdomen Note: Soft, nontender nondistended. Multiple well-healed surgical incisions including several laparoscopic incisions, a short midline laparotomy and a right lower quadrant transverse incision. No hernia or mass  Neurologic Note: Grossly intact, normal gait  Neuropsychiatric Note: Normal mood and affect, appropriate insight  Musculoskeletal Note: Strength symmetrical throughout, no deformity    Assessment & Plan   ESRD (END STAGE RENAL DISEASE) ON DIALYSIS (N18.6) Story: Desires peritoneal dialysis. I discussed with him that I would offer a laparoscopic assisted peritoneal dialysis catheter placement. If, once we enter the peritoneal cavity, he is found to have extensive adhesions than I would abort the procedure as this would be prohibitive to functional peritoneal dialysis and extensive adhesio lysis creates a bloody environment which would be likely to clotting off of the catheter. If however his adhesions are minimal than I would proceed. He understands that he would not be able to use the catheter for at least 2 weeks after placement and he would need to undergo further education with the dialysis nurse. We discussed risks of injury to intra-abdominal structures, bleeding, infection, pain, scarring, and risks of general anesthesia including heart attack, stroke, blood clots, death. He expressed understanding of these things and wishes to proceed as soon as possible He has undergone a nuclear stress test  On 4/4 with findings of no ST segment deviation  during stress, and a medium defect of moderate severity in the basal inferoseptal, basal inferior, basal inferolateral, mid inferoseptal, mid inferior and apical inferior location. 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 EF 39%. Intermediate risk study. Ok'd to proceed by Dr. Bronson Ing.

## 2016-06-24 NOTE — Transfer of Care (Signed)
Immediate Anesthesia Transfer of Care Note  Patient: Jesus [name]  Procedure(s) Performed: Procedure(s): LAPAROSCOPIC INSERTION CONTINUOUS AMBULATORY PERITONEAL DIALYSIS  (CAPD) CATHETER (N/A)  Patient Location: PACU  Anesthesia Type:General  Level of Consciousness: awake, alert , oriented and patient cooperative  Airway & Oxygen Therapy: Patient Spontanous Breathing and Patient connected to nasal cannula oxygen  Post-op Assessment: Report given to RN, Post -op Vital signs reviewed and stable and Patient moving all extremities  Post vital signs: Reviewed and stable  Last Vitals:  Vitals:   06/24/16 0915  BP: (!) 150/72  Pulse: (!) 44  Resp: 20  Temp: 36.7 C    Last Pain:  Vitals:   06/24/16 0915  TempSrc: Oral         Complications: No apparent anesthesia complications

## 2016-06-24 NOTE — Anesthesia Postprocedure Evaluation (Addendum)
Anesthesia Post Note  Patient: Jesus [name]  Procedure(s) Performed: Procedure(s) (LRB): LAPAROSCOPIC INSERTION CONTINUOUS AMBULATORY PERITONEAL DIALYSIS  (CAPD) CATHETER (N/A)  Patient location during evaluation: PACU Anesthesia Type: General Level of consciousness: awake, awake and alert and oriented Pain management: pain level controlled Vital Signs Assessment: post-procedure vital signs reviewed and stable Respiratory status: spontaneous breathing, nonlabored ventilation and respiratory function stable Cardiovascular status: blood pressure returned to baseline Postop Assessment: no headache Anesthetic complications: no       Last Vitals:  Vitals:   06/24/16 1353 06/24/16 1425  BP: 131/64 136/67  Pulse: (!) 48 (!) 47  Resp: 16 15  Temp:  36.3 C    Last Pain:  Vitals:   06/24/16 1400  TempSrc:   PainSc: 4                  Cleon Thoma COKER

## 2016-06-24 NOTE — Anesthesia Preprocedure Evaluation (Addendum)
Anesthesia Evaluation  Patient identified by MRN, date of birth, ID band Patient awake    Reviewed: Allergy & Precautions, NPO status , Patient's Chart, lab work & pertinent test results  Airway Mallampati: II  TM Distance: >3 FB Neck ROM: Full    Dental  (+) Teeth Intact   Pulmonary    breath sounds clear to auscultation       Cardiovascular hypertension,  Rhythm:Regular Rate:Normal     Neuro/Psych    GI/Hepatic   Endo/Other    Renal/GU      Musculoskeletal   Abdominal   Peds  Hematology   Anesthesia Other Findings   Reproductive/Obstetrics                            Anesthesia Physical Anesthesia Plan  ASA: III  Anesthesia Plan: General   Post-op Pain Management:    Induction: Intravenous  Airway Management Planned: Oral ETT  Additional Equipment:   Intra-op Plan:   Post-operative Plan: Extubation in OR  Informed Consent: I have reviewed the patients History and Physical, chart, labs and discussed the procedure including the risks, benefits and alternatives for the proposed anesthesia with the patient or authorized representative who has indicated his/her understanding and acceptance.     Plan Discussed with: Anesthesiologist and CRNA  Anesthesia Plan Comments:         Anesthesia Quick Evaluation

## 2016-06-25 ENCOUNTER — Encounter (HOSPITAL_COMMUNITY): Payer: Self-pay | Admitting: Surgery

## 2016-06-25 DIAGNOSIS — D631 Anemia in chronic kidney disease: Secondary | ICD-10-CM | POA: Diagnosis not present

## 2016-06-25 DIAGNOSIS — D509 Iron deficiency anemia, unspecified: Secondary | ICD-10-CM | POA: Diagnosis not present

## 2016-06-25 DIAGNOSIS — N2581 Secondary hyperparathyroidism of renal origin: Secondary | ICD-10-CM | POA: Diagnosis not present

## 2016-06-25 DIAGNOSIS — N186 End stage renal disease: Secondary | ICD-10-CM | POA: Diagnosis not present

## 2016-06-25 DIAGNOSIS — Z992 Dependence on renal dialysis: Secondary | ICD-10-CM | POA: Diagnosis not present

## 2016-06-27 DIAGNOSIS — N186 End stage renal disease: Secondary | ICD-10-CM | POA: Diagnosis not present

## 2016-06-27 DIAGNOSIS — Z992 Dependence on renal dialysis: Secondary | ICD-10-CM | POA: Diagnosis not present

## 2016-06-28 DIAGNOSIS — N186 End stage renal disease: Secondary | ICD-10-CM | POA: Diagnosis not present

## 2016-06-28 DIAGNOSIS — D509 Iron deficiency anemia, unspecified: Secondary | ICD-10-CM | POA: Diagnosis not present

## 2016-06-28 DIAGNOSIS — N2581 Secondary hyperparathyroidism of renal origin: Secondary | ICD-10-CM | POA: Diagnosis not present

## 2016-06-28 DIAGNOSIS — Z992 Dependence on renal dialysis: Secondary | ICD-10-CM | POA: Diagnosis not present

## 2016-06-30 DIAGNOSIS — N186 End stage renal disease: Secondary | ICD-10-CM | POA: Diagnosis not present

## 2016-06-30 DIAGNOSIS — D509 Iron deficiency anemia, unspecified: Secondary | ICD-10-CM | POA: Diagnosis not present

## 2016-06-30 DIAGNOSIS — Z992 Dependence on renal dialysis: Secondary | ICD-10-CM | POA: Diagnosis not present

## 2016-06-30 DIAGNOSIS — N2581 Secondary hyperparathyroidism of renal origin: Secondary | ICD-10-CM | POA: Diagnosis not present

## 2016-07-02 DIAGNOSIS — N2581 Secondary hyperparathyroidism of renal origin: Secondary | ICD-10-CM | POA: Diagnosis not present

## 2016-07-02 DIAGNOSIS — N186 End stage renal disease: Secondary | ICD-10-CM | POA: Diagnosis not present

## 2016-07-02 DIAGNOSIS — Z992 Dependence on renal dialysis: Secondary | ICD-10-CM | POA: Diagnosis not present

## 2016-07-02 DIAGNOSIS — D509 Iron deficiency anemia, unspecified: Secondary | ICD-10-CM | POA: Diagnosis not present

## 2016-07-05 DIAGNOSIS — N2581 Secondary hyperparathyroidism of renal origin: Secondary | ICD-10-CM | POA: Diagnosis not present

## 2016-07-05 DIAGNOSIS — D509 Iron deficiency anemia, unspecified: Secondary | ICD-10-CM | POA: Diagnosis not present

## 2016-07-05 DIAGNOSIS — N186 End stage renal disease: Secondary | ICD-10-CM | POA: Diagnosis not present

## 2016-07-05 DIAGNOSIS — Z992 Dependence on renal dialysis: Secondary | ICD-10-CM | POA: Diagnosis not present

## 2016-07-06 ENCOUNTER — Ambulatory Visit: Payer: Medicare Other | Admitting: Cardiovascular Disease

## 2016-07-07 DIAGNOSIS — N186 End stage renal disease: Secondary | ICD-10-CM | POA: Diagnosis not present

## 2016-07-07 DIAGNOSIS — Z992 Dependence on renal dialysis: Secondary | ICD-10-CM | POA: Diagnosis not present

## 2016-07-07 DIAGNOSIS — N2581 Secondary hyperparathyroidism of renal origin: Secondary | ICD-10-CM | POA: Diagnosis not present

## 2016-07-07 DIAGNOSIS — D509 Iron deficiency anemia, unspecified: Secondary | ICD-10-CM | POA: Diagnosis not present

## 2016-07-09 DIAGNOSIS — Z992 Dependence on renal dialysis: Secondary | ICD-10-CM | POA: Diagnosis not present

## 2016-07-09 DIAGNOSIS — N2581 Secondary hyperparathyroidism of renal origin: Secondary | ICD-10-CM | POA: Diagnosis not present

## 2016-07-09 DIAGNOSIS — N186 End stage renal disease: Secondary | ICD-10-CM | POA: Diagnosis not present

## 2016-07-09 DIAGNOSIS — D509 Iron deficiency anemia, unspecified: Secondary | ICD-10-CM | POA: Diagnosis not present

## 2016-07-12 DIAGNOSIS — N186 End stage renal disease: Secondary | ICD-10-CM | POA: Diagnosis not present

## 2016-07-12 DIAGNOSIS — D509 Iron deficiency anemia, unspecified: Secondary | ICD-10-CM | POA: Diagnosis not present

## 2016-07-12 DIAGNOSIS — Z992 Dependence on renal dialysis: Secondary | ICD-10-CM | POA: Diagnosis not present

## 2016-07-12 DIAGNOSIS — N2581 Secondary hyperparathyroidism of renal origin: Secondary | ICD-10-CM | POA: Diagnosis not present

## 2016-07-14 DIAGNOSIS — N2581 Secondary hyperparathyroidism of renal origin: Secondary | ICD-10-CM | POA: Diagnosis not present

## 2016-07-14 DIAGNOSIS — N186 End stage renal disease: Secondary | ICD-10-CM | POA: Diagnosis not present

## 2016-07-14 DIAGNOSIS — Z992 Dependence on renal dialysis: Secondary | ICD-10-CM | POA: Diagnosis not present

## 2016-07-14 DIAGNOSIS — D509 Iron deficiency anemia, unspecified: Secondary | ICD-10-CM | POA: Diagnosis not present

## 2016-07-16 DIAGNOSIS — D509 Iron deficiency anemia, unspecified: Secondary | ICD-10-CM | POA: Diagnosis not present

## 2016-07-16 DIAGNOSIS — N186 End stage renal disease: Secondary | ICD-10-CM | POA: Diagnosis not present

## 2016-07-16 DIAGNOSIS — N2581 Secondary hyperparathyroidism of renal origin: Secondary | ICD-10-CM | POA: Diagnosis not present

## 2016-07-16 DIAGNOSIS — Z992 Dependence on renal dialysis: Secondary | ICD-10-CM | POA: Diagnosis not present

## 2016-07-18 DIAGNOSIS — N186 End stage renal disease: Secondary | ICD-10-CM | POA: Diagnosis not present

## 2016-07-18 DIAGNOSIS — N2581 Secondary hyperparathyroidism of renal origin: Secondary | ICD-10-CM | POA: Diagnosis not present

## 2016-07-18 DIAGNOSIS — D509 Iron deficiency anemia, unspecified: Secondary | ICD-10-CM | POA: Diagnosis not present

## 2016-07-18 DIAGNOSIS — Z992 Dependence on renal dialysis: Secondary | ICD-10-CM | POA: Diagnosis not present

## 2016-07-19 DIAGNOSIS — Z992 Dependence on renal dialysis: Secondary | ICD-10-CM | POA: Diagnosis not present

## 2016-07-19 DIAGNOSIS — N186 End stage renal disease: Secondary | ICD-10-CM | POA: Diagnosis not present

## 2016-07-19 DIAGNOSIS — D509 Iron deficiency anemia, unspecified: Secondary | ICD-10-CM | POA: Diagnosis not present

## 2016-07-19 DIAGNOSIS — N2581 Secondary hyperparathyroidism of renal origin: Secondary | ICD-10-CM | POA: Diagnosis not present

## 2016-07-20 DIAGNOSIS — D509 Iron deficiency anemia, unspecified: Secondary | ICD-10-CM | POA: Diagnosis not present

## 2016-07-20 DIAGNOSIS — N2581 Secondary hyperparathyroidism of renal origin: Secondary | ICD-10-CM | POA: Diagnosis not present

## 2016-07-20 DIAGNOSIS — N186 End stage renal disease: Secondary | ICD-10-CM | POA: Diagnosis not present

## 2016-07-20 DIAGNOSIS — Z992 Dependence on renal dialysis: Secondary | ICD-10-CM | POA: Diagnosis not present

## 2016-07-21 DIAGNOSIS — D509 Iron deficiency anemia, unspecified: Secondary | ICD-10-CM | POA: Diagnosis not present

## 2016-07-21 DIAGNOSIS — N2581 Secondary hyperparathyroidism of renal origin: Secondary | ICD-10-CM | POA: Diagnosis not present

## 2016-07-21 DIAGNOSIS — Z992 Dependence on renal dialysis: Secondary | ICD-10-CM | POA: Diagnosis not present

## 2016-07-21 DIAGNOSIS — N186 End stage renal disease: Secondary | ICD-10-CM | POA: Diagnosis not present

## 2016-07-23 DIAGNOSIS — N186 End stage renal disease: Secondary | ICD-10-CM | POA: Diagnosis not present

## 2016-07-23 DIAGNOSIS — N2581 Secondary hyperparathyroidism of renal origin: Secondary | ICD-10-CM | POA: Diagnosis not present

## 2016-07-23 DIAGNOSIS — Z992 Dependence on renal dialysis: Secondary | ICD-10-CM | POA: Diagnosis not present

## 2016-07-26 DIAGNOSIS — N186 End stage renal disease: Secondary | ICD-10-CM | POA: Diagnosis not present

## 2016-07-26 DIAGNOSIS — Z992 Dependence on renal dialysis: Secondary | ICD-10-CM | POA: Diagnosis not present

## 2016-07-27 DIAGNOSIS — N186 End stage renal disease: Secondary | ICD-10-CM | POA: Diagnosis not present

## 2016-07-27 DIAGNOSIS — Z992 Dependence on renal dialysis: Secondary | ICD-10-CM | POA: Diagnosis not present

## 2016-07-28 DIAGNOSIS — Z992 Dependence on renal dialysis: Secondary | ICD-10-CM | POA: Diagnosis not present

## 2016-07-28 DIAGNOSIS — N186 End stage renal disease: Secondary | ICD-10-CM | POA: Diagnosis not present

## 2016-07-29 DIAGNOSIS — Z992 Dependence on renal dialysis: Secondary | ICD-10-CM | POA: Diagnosis not present

## 2016-07-29 DIAGNOSIS — N186 End stage renal disease: Secondary | ICD-10-CM | POA: Diagnosis not present

## 2016-07-30 DIAGNOSIS — N186 End stage renal disease: Secondary | ICD-10-CM | POA: Diagnosis not present

## 2016-07-30 DIAGNOSIS — Z992 Dependence on renal dialysis: Secondary | ICD-10-CM | POA: Diagnosis not present

## 2016-07-31 DIAGNOSIS — N186 End stage renal disease: Secondary | ICD-10-CM | POA: Diagnosis not present

## 2016-07-31 DIAGNOSIS — Z992 Dependence on renal dialysis: Secondary | ICD-10-CM | POA: Diagnosis not present

## 2016-08-01 DIAGNOSIS — C651 Malignant neoplasm of right renal pelvis: Secondary | ICD-10-CM | POA: Diagnosis not present

## 2016-08-01 DIAGNOSIS — R188 Other ascites: Secondary | ICD-10-CM | POA: Diagnosis not present

## 2016-08-01 DIAGNOSIS — N186 End stage renal disease: Secondary | ICD-10-CM | POA: Diagnosis not present

## 2016-08-01 DIAGNOSIS — Z992 Dependence on renal dialysis: Secondary | ICD-10-CM | POA: Diagnosis not present

## 2016-08-01 DIAGNOSIS — J90 Pleural effusion, not elsewhere classified: Secondary | ICD-10-CM | POA: Diagnosis not present

## 2016-08-01 DIAGNOSIS — C661 Malignant neoplasm of right ureter: Secondary | ICD-10-CM | POA: Diagnosis not present

## 2016-08-02 DIAGNOSIS — D509 Iron deficiency anemia, unspecified: Secondary | ICD-10-CM | POA: Diagnosis not present

## 2016-08-02 DIAGNOSIS — Z992 Dependence on renal dialysis: Secondary | ICD-10-CM | POA: Diagnosis not present

## 2016-08-02 DIAGNOSIS — N186 End stage renal disease: Secondary | ICD-10-CM | POA: Diagnosis not present

## 2016-08-03 DIAGNOSIS — N186 End stage renal disease: Secondary | ICD-10-CM | POA: Diagnosis not present

## 2016-08-03 DIAGNOSIS — Z992 Dependence on renal dialysis: Secondary | ICD-10-CM | POA: Diagnosis not present

## 2016-08-03 DIAGNOSIS — D631 Anemia in chronic kidney disease: Secondary | ICD-10-CM | POA: Diagnosis not present

## 2016-08-04 DIAGNOSIS — R188 Other ascites: Secondary | ICD-10-CM | POA: Diagnosis not present

## 2016-08-04 DIAGNOSIS — Z992 Dependence on renal dialysis: Secondary | ICD-10-CM | POA: Diagnosis not present

## 2016-08-04 DIAGNOSIS — D631 Anemia in chronic kidney disease: Secondary | ICD-10-CM | POA: Diagnosis not present

## 2016-08-04 DIAGNOSIS — C661 Malignant neoplasm of right ureter: Secondary | ICD-10-CM | POA: Diagnosis not present

## 2016-08-04 DIAGNOSIS — C651 Malignant neoplasm of right renal pelvis: Secondary | ICD-10-CM | POA: Diagnosis not present

## 2016-08-04 DIAGNOSIS — N186 End stage renal disease: Secondary | ICD-10-CM | POA: Diagnosis not present

## 2016-08-04 DIAGNOSIS — J90 Pleural effusion, not elsewhere classified: Secondary | ICD-10-CM | POA: Diagnosis not present

## 2016-08-05 DIAGNOSIS — D631 Anemia in chronic kidney disease: Secondary | ICD-10-CM | POA: Diagnosis not present

## 2016-08-05 DIAGNOSIS — N186 End stage renal disease: Secondary | ICD-10-CM | POA: Diagnosis not present

## 2016-08-05 DIAGNOSIS — Z992 Dependence on renal dialysis: Secondary | ICD-10-CM | POA: Diagnosis not present

## 2016-08-06 DIAGNOSIS — N186 End stage renal disease: Secondary | ICD-10-CM | POA: Diagnosis not present

## 2016-08-06 DIAGNOSIS — Z992 Dependence on renal dialysis: Secondary | ICD-10-CM | POA: Diagnosis not present

## 2016-08-06 DIAGNOSIS — D631 Anemia in chronic kidney disease: Secondary | ICD-10-CM | POA: Diagnosis not present

## 2016-08-07 DIAGNOSIS — D631 Anemia in chronic kidney disease: Secondary | ICD-10-CM | POA: Diagnosis not present

## 2016-08-07 DIAGNOSIS — Z992 Dependence on renal dialysis: Secondary | ICD-10-CM | POA: Diagnosis not present

## 2016-08-07 DIAGNOSIS — N186 End stage renal disease: Secondary | ICD-10-CM | POA: Diagnosis not present

## 2016-08-08 DIAGNOSIS — N186 End stage renal disease: Secondary | ICD-10-CM | POA: Diagnosis not present

## 2016-08-08 DIAGNOSIS — Z992 Dependence on renal dialysis: Secondary | ICD-10-CM | POA: Diagnosis not present

## 2016-08-08 DIAGNOSIS — D631 Anemia in chronic kidney disease: Secondary | ICD-10-CM | POA: Diagnosis not present

## 2016-08-09 DIAGNOSIS — N186 End stage renal disease: Secondary | ICD-10-CM | POA: Diagnosis not present

## 2016-08-09 DIAGNOSIS — Z992 Dependence on renal dialysis: Secondary | ICD-10-CM | POA: Diagnosis not present

## 2016-08-09 DIAGNOSIS — D631 Anemia in chronic kidney disease: Secondary | ICD-10-CM | POA: Diagnosis not present

## 2016-08-10 DIAGNOSIS — D631 Anemia in chronic kidney disease: Secondary | ICD-10-CM | POA: Diagnosis not present

## 2016-08-10 DIAGNOSIS — Z992 Dependence on renal dialysis: Secondary | ICD-10-CM | POA: Diagnosis not present

## 2016-08-10 DIAGNOSIS — N186 End stage renal disease: Secondary | ICD-10-CM | POA: Diagnosis not present

## 2016-08-11 DIAGNOSIS — Z992 Dependence on renal dialysis: Secondary | ICD-10-CM | POA: Diagnosis not present

## 2016-08-11 DIAGNOSIS — L988 Other specified disorders of the skin and subcutaneous tissue: Secondary | ICD-10-CM | POA: Diagnosis not present

## 2016-08-11 DIAGNOSIS — D485 Neoplasm of uncertain behavior of skin: Secondary | ICD-10-CM | POA: Diagnosis not present

## 2016-08-11 DIAGNOSIS — N186 End stage renal disease: Secondary | ICD-10-CM | POA: Diagnosis not present

## 2016-08-11 DIAGNOSIS — D631 Anemia in chronic kidney disease: Secondary | ICD-10-CM | POA: Diagnosis not present

## 2016-08-11 DIAGNOSIS — D492 Neoplasm of unspecified behavior of bone, soft tissue, and skin: Secondary | ICD-10-CM | POA: Diagnosis not present

## 2016-08-12 DIAGNOSIS — D631 Anemia in chronic kidney disease: Secondary | ICD-10-CM | POA: Diagnosis not present

## 2016-08-12 DIAGNOSIS — N186 End stage renal disease: Secondary | ICD-10-CM | POA: Diagnosis not present

## 2016-08-12 DIAGNOSIS — Z992 Dependence on renal dialysis: Secondary | ICD-10-CM | POA: Diagnosis not present

## 2016-08-13 DIAGNOSIS — D631 Anemia in chronic kidney disease: Secondary | ICD-10-CM | POA: Diagnosis not present

## 2016-08-13 DIAGNOSIS — N186 End stage renal disease: Secondary | ICD-10-CM | POA: Diagnosis not present

## 2016-08-13 DIAGNOSIS — Z992 Dependence on renal dialysis: Secondary | ICD-10-CM | POA: Diagnosis not present

## 2016-08-14 DIAGNOSIS — N186 End stage renal disease: Secondary | ICD-10-CM | POA: Diagnosis not present

## 2016-08-14 DIAGNOSIS — D631 Anemia in chronic kidney disease: Secondary | ICD-10-CM | POA: Diagnosis not present

## 2016-08-14 DIAGNOSIS — Z992 Dependence on renal dialysis: Secondary | ICD-10-CM | POA: Diagnosis not present

## 2016-08-15 DIAGNOSIS — N186 End stage renal disease: Secondary | ICD-10-CM | POA: Diagnosis not present

## 2016-08-15 DIAGNOSIS — D631 Anemia in chronic kidney disease: Secondary | ICD-10-CM | POA: Diagnosis not present

## 2016-08-15 DIAGNOSIS — J90 Pleural effusion, not elsewhere classified: Secondary | ICD-10-CM | POA: Diagnosis not present

## 2016-08-15 DIAGNOSIS — Z992 Dependence on renal dialysis: Secondary | ICD-10-CM | POA: Diagnosis not present

## 2016-08-16 DIAGNOSIS — D631 Anemia in chronic kidney disease: Secondary | ICD-10-CM | POA: Diagnosis not present

## 2016-08-16 DIAGNOSIS — N186 End stage renal disease: Secondary | ICD-10-CM | POA: Diagnosis not present

## 2016-08-16 DIAGNOSIS — Z992 Dependence on renal dialysis: Secondary | ICD-10-CM | POA: Diagnosis not present

## 2016-08-17 DIAGNOSIS — D631 Anemia in chronic kidney disease: Secondary | ICD-10-CM | POA: Diagnosis not present

## 2016-08-17 DIAGNOSIS — N186 End stage renal disease: Secondary | ICD-10-CM | POA: Diagnosis not present

## 2016-08-17 DIAGNOSIS — Z992 Dependence on renal dialysis: Secondary | ICD-10-CM | POA: Diagnosis not present

## 2016-08-18 DIAGNOSIS — D631 Anemia in chronic kidney disease: Secondary | ICD-10-CM | POA: Diagnosis not present

## 2016-08-18 DIAGNOSIS — Z992 Dependence on renal dialysis: Secondary | ICD-10-CM | POA: Diagnosis not present

## 2016-08-18 DIAGNOSIS — N186 End stage renal disease: Secondary | ICD-10-CM | POA: Diagnosis not present

## 2016-08-19 DIAGNOSIS — N186 End stage renal disease: Secondary | ICD-10-CM | POA: Diagnosis not present

## 2016-08-19 DIAGNOSIS — Z992 Dependence on renal dialysis: Secondary | ICD-10-CM | POA: Diagnosis not present

## 2016-08-19 DIAGNOSIS — D631 Anemia in chronic kidney disease: Secondary | ICD-10-CM | POA: Diagnosis not present

## 2016-08-20 DIAGNOSIS — D631 Anemia in chronic kidney disease: Secondary | ICD-10-CM | POA: Diagnosis not present

## 2016-08-20 DIAGNOSIS — Z992 Dependence on renal dialysis: Secondary | ICD-10-CM | POA: Diagnosis not present

## 2016-08-20 DIAGNOSIS — N186 End stage renal disease: Secondary | ICD-10-CM | POA: Diagnosis not present

## 2016-08-21 DIAGNOSIS — D631 Anemia in chronic kidney disease: Secondary | ICD-10-CM | POA: Diagnosis not present

## 2016-08-21 DIAGNOSIS — Z992 Dependence on renal dialysis: Secondary | ICD-10-CM | POA: Diagnosis not present

## 2016-08-21 DIAGNOSIS — N186 End stage renal disease: Secondary | ICD-10-CM | POA: Diagnosis not present

## 2016-08-22 DIAGNOSIS — D631 Anemia in chronic kidney disease: Secondary | ICD-10-CM | POA: Diagnosis not present

## 2016-08-22 DIAGNOSIS — N186 End stage renal disease: Secondary | ICD-10-CM | POA: Diagnosis not present

## 2016-08-22 DIAGNOSIS — Z992 Dependence on renal dialysis: Secondary | ICD-10-CM | POA: Diagnosis not present

## 2016-08-23 DIAGNOSIS — N186 End stage renal disease: Secondary | ICD-10-CM | POA: Diagnosis not present

## 2016-08-23 DIAGNOSIS — Z992 Dependence on renal dialysis: Secondary | ICD-10-CM | POA: Diagnosis not present

## 2016-08-23 DIAGNOSIS — D631 Anemia in chronic kidney disease: Secondary | ICD-10-CM | POA: Diagnosis not present

## 2016-08-24 DIAGNOSIS — Z992 Dependence on renal dialysis: Secondary | ICD-10-CM | POA: Diagnosis not present

## 2016-08-24 DIAGNOSIS — D631 Anemia in chronic kidney disease: Secondary | ICD-10-CM | POA: Diagnosis not present

## 2016-08-24 DIAGNOSIS — N186 End stage renal disease: Secondary | ICD-10-CM | POA: Diagnosis not present

## 2016-08-25 DIAGNOSIS — D631 Anemia in chronic kidney disease: Secondary | ICD-10-CM | POA: Diagnosis not present

## 2016-08-25 DIAGNOSIS — Z992 Dependence on renal dialysis: Secondary | ICD-10-CM | POA: Diagnosis not present

## 2016-08-25 DIAGNOSIS — N186 End stage renal disease: Secondary | ICD-10-CM | POA: Diagnosis not present

## 2016-08-26 DIAGNOSIS — Z992 Dependence on renal dialysis: Secondary | ICD-10-CM | POA: Diagnosis not present

## 2016-08-26 DIAGNOSIS — N186 End stage renal disease: Secondary | ICD-10-CM | POA: Diagnosis not present

## 2016-08-26 DIAGNOSIS — D631 Anemia in chronic kidney disease: Secondary | ICD-10-CM | POA: Diagnosis not present

## 2016-08-27 DIAGNOSIS — Z992 Dependence on renal dialysis: Secondary | ICD-10-CM | POA: Diagnosis not present

## 2016-08-27 DIAGNOSIS — N186 End stage renal disease: Secondary | ICD-10-CM | POA: Diagnosis not present

## 2016-08-27 DIAGNOSIS — D631 Anemia in chronic kidney disease: Secondary | ICD-10-CM | POA: Diagnosis not present

## 2016-08-28 DIAGNOSIS — D631 Anemia in chronic kidney disease: Secondary | ICD-10-CM | POA: Diagnosis not present

## 2016-08-28 DIAGNOSIS — Z992 Dependence on renal dialysis: Secondary | ICD-10-CM | POA: Diagnosis not present

## 2016-08-28 DIAGNOSIS — N186 End stage renal disease: Secondary | ICD-10-CM | POA: Diagnosis not present

## 2016-08-29 DIAGNOSIS — N186 End stage renal disease: Secondary | ICD-10-CM | POA: Diagnosis not present

## 2016-08-29 DIAGNOSIS — Z992 Dependence on renal dialysis: Secondary | ICD-10-CM | POA: Diagnosis not present

## 2016-08-29 DIAGNOSIS — D631 Anemia in chronic kidney disease: Secondary | ICD-10-CM | POA: Diagnosis not present

## 2016-08-30 DIAGNOSIS — N186 End stage renal disease: Secondary | ICD-10-CM | POA: Diagnosis not present

## 2016-08-30 DIAGNOSIS — D631 Anemia in chronic kidney disease: Secondary | ICD-10-CM | POA: Diagnosis not present

## 2016-08-30 DIAGNOSIS — Z992 Dependence on renal dialysis: Secondary | ICD-10-CM | POA: Diagnosis not present

## 2016-08-31 DIAGNOSIS — N186 End stage renal disease: Secondary | ICD-10-CM | POA: Diagnosis not present

## 2016-08-31 DIAGNOSIS — Z992 Dependence on renal dialysis: Secondary | ICD-10-CM | POA: Diagnosis not present

## 2016-08-31 DIAGNOSIS — D631 Anemia in chronic kidney disease: Secondary | ICD-10-CM | POA: Diagnosis not present

## 2016-09-01 DIAGNOSIS — Z992 Dependence on renal dialysis: Secondary | ICD-10-CM | POA: Diagnosis not present

## 2016-09-01 DIAGNOSIS — D631 Anemia in chronic kidney disease: Secondary | ICD-10-CM | POA: Diagnosis not present

## 2016-09-01 DIAGNOSIS — N186 End stage renal disease: Secondary | ICD-10-CM | POA: Diagnosis not present

## 2016-09-02 DIAGNOSIS — N186 End stage renal disease: Secondary | ICD-10-CM | POA: Diagnosis not present

## 2016-09-02 DIAGNOSIS — Z992 Dependence on renal dialysis: Secondary | ICD-10-CM | POA: Diagnosis not present

## 2016-09-02 DIAGNOSIS — D631 Anemia in chronic kidney disease: Secondary | ICD-10-CM | POA: Diagnosis not present

## 2016-09-03 DIAGNOSIS — D631 Anemia in chronic kidney disease: Secondary | ICD-10-CM | POA: Diagnosis not present

## 2016-09-03 DIAGNOSIS — N186 End stage renal disease: Secondary | ICD-10-CM | POA: Diagnosis not present

## 2016-09-03 DIAGNOSIS — Z992 Dependence on renal dialysis: Secondary | ICD-10-CM | POA: Diagnosis not present

## 2016-09-04 DIAGNOSIS — N186 End stage renal disease: Secondary | ICD-10-CM | POA: Diagnosis not present

## 2016-09-04 DIAGNOSIS — Z992 Dependence on renal dialysis: Secondary | ICD-10-CM | POA: Diagnosis not present

## 2016-09-04 DIAGNOSIS — D631 Anemia in chronic kidney disease: Secondary | ICD-10-CM | POA: Diagnosis not present

## 2016-09-05 DIAGNOSIS — Z992 Dependence on renal dialysis: Secondary | ICD-10-CM | POA: Diagnosis not present

## 2016-09-05 DIAGNOSIS — D631 Anemia in chronic kidney disease: Secondary | ICD-10-CM | POA: Diagnosis not present

## 2016-09-05 DIAGNOSIS — N186 End stage renal disease: Secondary | ICD-10-CM | POA: Diagnosis not present

## 2016-09-06 DIAGNOSIS — D631 Anemia in chronic kidney disease: Secondary | ICD-10-CM | POA: Diagnosis not present

## 2016-09-06 DIAGNOSIS — N186 End stage renal disease: Secondary | ICD-10-CM | POA: Diagnosis not present

## 2016-09-06 DIAGNOSIS — Z992 Dependence on renal dialysis: Secondary | ICD-10-CM | POA: Diagnosis not present

## 2016-09-07 DIAGNOSIS — D631 Anemia in chronic kidney disease: Secondary | ICD-10-CM | POA: Diagnosis not present

## 2016-09-07 DIAGNOSIS — Z992 Dependence on renal dialysis: Secondary | ICD-10-CM | POA: Diagnosis not present

## 2016-09-07 DIAGNOSIS — N186 End stage renal disease: Secondary | ICD-10-CM | POA: Diagnosis not present

## 2016-09-08 DIAGNOSIS — D631 Anemia in chronic kidney disease: Secondary | ICD-10-CM | POA: Diagnosis not present

## 2016-09-08 DIAGNOSIS — Z992 Dependence on renal dialysis: Secondary | ICD-10-CM | POA: Diagnosis not present

## 2016-09-08 DIAGNOSIS — N186 End stage renal disease: Secondary | ICD-10-CM | POA: Diagnosis not present

## 2016-09-09 DIAGNOSIS — D631 Anemia in chronic kidney disease: Secondary | ICD-10-CM | POA: Diagnosis not present

## 2016-09-09 DIAGNOSIS — N186 End stage renal disease: Secondary | ICD-10-CM | POA: Diagnosis not present

## 2016-09-09 DIAGNOSIS — Z992 Dependence on renal dialysis: Secondary | ICD-10-CM | POA: Diagnosis not present

## 2016-09-10 DIAGNOSIS — N186 End stage renal disease: Secondary | ICD-10-CM | POA: Diagnosis not present

## 2016-09-10 DIAGNOSIS — Z992 Dependence on renal dialysis: Secondary | ICD-10-CM | POA: Diagnosis not present

## 2016-09-10 DIAGNOSIS — D631 Anemia in chronic kidney disease: Secondary | ICD-10-CM | POA: Diagnosis not present

## 2016-09-11 DIAGNOSIS — D631 Anemia in chronic kidney disease: Secondary | ICD-10-CM | POA: Diagnosis not present

## 2016-09-11 DIAGNOSIS — Z992 Dependence on renal dialysis: Secondary | ICD-10-CM | POA: Diagnosis not present

## 2016-09-11 DIAGNOSIS — N186 End stage renal disease: Secondary | ICD-10-CM | POA: Diagnosis not present

## 2016-09-12 DIAGNOSIS — N186 End stage renal disease: Secondary | ICD-10-CM | POA: Diagnosis not present

## 2016-09-12 DIAGNOSIS — Z992 Dependence on renal dialysis: Secondary | ICD-10-CM | POA: Diagnosis not present

## 2016-09-12 DIAGNOSIS — D631 Anemia in chronic kidney disease: Secondary | ICD-10-CM | POA: Diagnosis not present

## 2016-09-13 DIAGNOSIS — Z992 Dependence on renal dialysis: Secondary | ICD-10-CM | POA: Diagnosis not present

## 2016-09-13 DIAGNOSIS — D631 Anemia in chronic kidney disease: Secondary | ICD-10-CM | POA: Diagnosis not present

## 2016-09-13 DIAGNOSIS — N186 End stage renal disease: Secondary | ICD-10-CM | POA: Diagnosis not present

## 2016-09-14 DIAGNOSIS — N186 End stage renal disease: Secondary | ICD-10-CM | POA: Diagnosis not present

## 2016-09-14 DIAGNOSIS — Z992 Dependence on renal dialysis: Secondary | ICD-10-CM | POA: Diagnosis not present

## 2016-09-14 DIAGNOSIS — D631 Anemia in chronic kidney disease: Secondary | ICD-10-CM | POA: Diagnosis not present

## 2016-09-15 DIAGNOSIS — D631 Anemia in chronic kidney disease: Secondary | ICD-10-CM | POA: Diagnosis not present

## 2016-09-15 DIAGNOSIS — Z992 Dependence on renal dialysis: Secondary | ICD-10-CM | POA: Diagnosis not present

## 2016-09-15 DIAGNOSIS — N186 End stage renal disease: Secondary | ICD-10-CM | POA: Diagnosis not present

## 2016-09-16 DIAGNOSIS — Z992 Dependence on renal dialysis: Secondary | ICD-10-CM | POA: Diagnosis not present

## 2016-09-16 DIAGNOSIS — D631 Anemia in chronic kidney disease: Secondary | ICD-10-CM | POA: Diagnosis not present

## 2016-09-16 DIAGNOSIS — N186 End stage renal disease: Secondary | ICD-10-CM | POA: Diagnosis not present

## 2016-09-17 DIAGNOSIS — Z992 Dependence on renal dialysis: Secondary | ICD-10-CM | POA: Diagnosis not present

## 2016-09-17 DIAGNOSIS — N186 End stage renal disease: Secondary | ICD-10-CM | POA: Diagnosis not present

## 2016-09-17 DIAGNOSIS — D631 Anemia in chronic kidney disease: Secondary | ICD-10-CM | POA: Diagnosis not present

## 2016-09-18 DIAGNOSIS — Z992 Dependence on renal dialysis: Secondary | ICD-10-CM | POA: Diagnosis not present

## 2016-09-18 DIAGNOSIS — N186 End stage renal disease: Secondary | ICD-10-CM | POA: Diagnosis not present

## 2016-09-18 DIAGNOSIS — D631 Anemia in chronic kidney disease: Secondary | ICD-10-CM | POA: Diagnosis not present

## 2016-09-19 DIAGNOSIS — N186 End stage renal disease: Secondary | ICD-10-CM | POA: Diagnosis not present

## 2016-09-19 DIAGNOSIS — Z992 Dependence on renal dialysis: Secondary | ICD-10-CM | POA: Diagnosis not present

## 2016-09-19 DIAGNOSIS — D631 Anemia in chronic kidney disease: Secondary | ICD-10-CM | POA: Diagnosis not present

## 2016-09-20 DIAGNOSIS — D631 Anemia in chronic kidney disease: Secondary | ICD-10-CM | POA: Diagnosis not present

## 2016-09-20 DIAGNOSIS — Z992 Dependence on renal dialysis: Secondary | ICD-10-CM | POA: Diagnosis not present

## 2016-09-20 DIAGNOSIS — N186 End stage renal disease: Secondary | ICD-10-CM | POA: Diagnosis not present

## 2016-09-21 DIAGNOSIS — D631 Anemia in chronic kidney disease: Secondary | ICD-10-CM | POA: Diagnosis not present

## 2016-09-21 DIAGNOSIS — N186 End stage renal disease: Secondary | ICD-10-CM | POA: Diagnosis not present

## 2016-09-21 DIAGNOSIS — Z992 Dependence on renal dialysis: Secondary | ICD-10-CM | POA: Diagnosis not present

## 2016-09-22 DIAGNOSIS — M5020 Other cervical disc displacement, unspecified cervical region: Secondary | ICD-10-CM | POA: Diagnosis not present

## 2016-09-22 DIAGNOSIS — M48061 Spinal stenosis, lumbar region without neurogenic claudication: Secondary | ICD-10-CM | POA: Diagnosis not present

## 2016-09-22 DIAGNOSIS — D631 Anemia in chronic kidney disease: Secondary | ICD-10-CM | POA: Diagnosis not present

## 2016-09-22 DIAGNOSIS — Z992 Dependence on renal dialysis: Secondary | ICD-10-CM | POA: Diagnosis not present

## 2016-09-22 DIAGNOSIS — M47816 Spondylosis without myelopathy or radiculopathy, lumbar region: Secondary | ICD-10-CM | POA: Diagnosis not present

## 2016-09-22 DIAGNOSIS — N186 End stage renal disease: Secondary | ICD-10-CM | POA: Diagnosis not present

## 2016-09-23 DIAGNOSIS — N186 End stage renal disease: Secondary | ICD-10-CM | POA: Diagnosis not present

## 2016-09-23 DIAGNOSIS — D631 Anemia in chronic kidney disease: Secondary | ICD-10-CM | POA: Diagnosis not present

## 2016-09-23 DIAGNOSIS — Z992 Dependence on renal dialysis: Secondary | ICD-10-CM | POA: Diagnosis not present

## 2016-09-24 DIAGNOSIS — D631 Anemia in chronic kidney disease: Secondary | ICD-10-CM | POA: Diagnosis not present

## 2016-09-24 DIAGNOSIS — Z992 Dependence on renal dialysis: Secondary | ICD-10-CM | POA: Diagnosis not present

## 2016-09-24 DIAGNOSIS — N186 End stage renal disease: Secondary | ICD-10-CM | POA: Diagnosis not present

## 2016-09-25 DIAGNOSIS — Z992 Dependence on renal dialysis: Secondary | ICD-10-CM | POA: Diagnosis not present

## 2016-09-25 DIAGNOSIS — D631 Anemia in chronic kidney disease: Secondary | ICD-10-CM | POA: Diagnosis not present

## 2016-09-25 DIAGNOSIS — N186 End stage renal disease: Secondary | ICD-10-CM | POA: Diagnosis not present

## 2016-09-26 DIAGNOSIS — D631 Anemia in chronic kidney disease: Secondary | ICD-10-CM | POA: Diagnosis not present

## 2016-09-26 DIAGNOSIS — N186 End stage renal disease: Secondary | ICD-10-CM | POA: Diagnosis not present

## 2016-09-26 DIAGNOSIS — Z992 Dependence on renal dialysis: Secondary | ICD-10-CM | POA: Diagnosis not present

## 2016-09-27 DIAGNOSIS — Z992 Dependence on renal dialysis: Secondary | ICD-10-CM | POA: Diagnosis not present

## 2016-09-27 DIAGNOSIS — N186 End stage renal disease: Secondary | ICD-10-CM | POA: Diagnosis not present

## 2016-09-27 DIAGNOSIS — D631 Anemia in chronic kidney disease: Secondary | ICD-10-CM | POA: Diagnosis not present

## 2016-09-28 DIAGNOSIS — D509 Iron deficiency anemia, unspecified: Secondary | ICD-10-CM | POA: Diagnosis not present

## 2016-09-28 DIAGNOSIS — N186 End stage renal disease: Secondary | ICD-10-CM | POA: Diagnosis not present

## 2016-09-28 DIAGNOSIS — D631 Anemia in chronic kidney disease: Secondary | ICD-10-CM | POA: Diagnosis not present

## 2016-09-28 DIAGNOSIS — Z992 Dependence on renal dialysis: Secondary | ICD-10-CM | POA: Diagnosis not present

## 2016-09-29 DIAGNOSIS — Z992 Dependence on renal dialysis: Secondary | ICD-10-CM | POA: Diagnosis not present

## 2016-09-29 DIAGNOSIS — D631 Anemia in chronic kidney disease: Secondary | ICD-10-CM | POA: Diagnosis not present

## 2016-09-29 DIAGNOSIS — N186 End stage renal disease: Secondary | ICD-10-CM | POA: Diagnosis not present

## 2016-09-29 DIAGNOSIS — D509 Iron deficiency anemia, unspecified: Secondary | ICD-10-CM | POA: Diagnosis not present

## 2016-09-30 DIAGNOSIS — D631 Anemia in chronic kidney disease: Secondary | ICD-10-CM | POA: Diagnosis not present

## 2016-09-30 DIAGNOSIS — D509 Iron deficiency anemia, unspecified: Secondary | ICD-10-CM | POA: Diagnosis not present

## 2016-09-30 DIAGNOSIS — N186 End stage renal disease: Secondary | ICD-10-CM | POA: Diagnosis not present

## 2016-09-30 DIAGNOSIS — Z992 Dependence on renal dialysis: Secondary | ICD-10-CM | POA: Diagnosis not present

## 2016-10-01 DIAGNOSIS — D509 Iron deficiency anemia, unspecified: Secondary | ICD-10-CM | POA: Diagnosis not present

## 2016-10-01 DIAGNOSIS — D631 Anemia in chronic kidney disease: Secondary | ICD-10-CM | POA: Diagnosis not present

## 2016-10-01 DIAGNOSIS — Z992 Dependence on renal dialysis: Secondary | ICD-10-CM | POA: Diagnosis not present

## 2016-10-01 DIAGNOSIS — N186 End stage renal disease: Secondary | ICD-10-CM | POA: Diagnosis not present

## 2016-10-02 DIAGNOSIS — D509 Iron deficiency anemia, unspecified: Secondary | ICD-10-CM | POA: Diagnosis not present

## 2016-10-02 DIAGNOSIS — D631 Anemia in chronic kidney disease: Secondary | ICD-10-CM | POA: Diagnosis not present

## 2016-10-02 DIAGNOSIS — N186 End stage renal disease: Secondary | ICD-10-CM | POA: Diagnosis not present

## 2016-10-02 DIAGNOSIS — Z992 Dependence on renal dialysis: Secondary | ICD-10-CM | POA: Diagnosis not present

## 2016-10-03 DIAGNOSIS — N186 End stage renal disease: Secondary | ICD-10-CM | POA: Diagnosis not present

## 2016-10-03 DIAGNOSIS — D509 Iron deficiency anemia, unspecified: Secondary | ICD-10-CM | POA: Diagnosis not present

## 2016-10-03 DIAGNOSIS — Z992 Dependence on renal dialysis: Secondary | ICD-10-CM | POA: Diagnosis not present

## 2016-10-03 DIAGNOSIS — D631 Anemia in chronic kidney disease: Secondary | ICD-10-CM | POA: Diagnosis not present

## 2016-10-04 DIAGNOSIS — D631 Anemia in chronic kidney disease: Secondary | ICD-10-CM | POA: Diagnosis not present

## 2016-10-04 DIAGNOSIS — Z992 Dependence on renal dialysis: Secondary | ICD-10-CM | POA: Diagnosis not present

## 2016-10-04 DIAGNOSIS — D509 Iron deficiency anemia, unspecified: Secondary | ICD-10-CM | POA: Diagnosis not present

## 2016-10-04 DIAGNOSIS — N186 End stage renal disease: Secondary | ICD-10-CM | POA: Diagnosis not present

## 2016-10-05 DIAGNOSIS — Z992 Dependence on renal dialysis: Secondary | ICD-10-CM | POA: Diagnosis not present

## 2016-10-05 DIAGNOSIS — N186 End stage renal disease: Secondary | ICD-10-CM | POA: Diagnosis not present

## 2016-10-05 DIAGNOSIS — D631 Anemia in chronic kidney disease: Secondary | ICD-10-CM | POA: Diagnosis not present

## 2016-10-05 DIAGNOSIS — D509 Iron deficiency anemia, unspecified: Secondary | ICD-10-CM | POA: Diagnosis not present

## 2016-10-06 DIAGNOSIS — Z992 Dependence on renal dialysis: Secondary | ICD-10-CM | POA: Diagnosis not present

## 2016-10-06 DIAGNOSIS — D509 Iron deficiency anemia, unspecified: Secondary | ICD-10-CM | POA: Diagnosis not present

## 2016-10-06 DIAGNOSIS — R188 Other ascites: Secondary | ICD-10-CM | POA: Diagnosis not present

## 2016-10-06 DIAGNOSIS — N186 End stage renal disease: Secondary | ICD-10-CM | POA: Diagnosis not present

## 2016-10-06 DIAGNOSIS — J90 Pleural effusion, not elsewhere classified: Secondary | ICD-10-CM | POA: Diagnosis not present

## 2016-10-06 DIAGNOSIS — D631 Anemia in chronic kidney disease: Secondary | ICD-10-CM | POA: Diagnosis not present

## 2016-10-07 DIAGNOSIS — D631 Anemia in chronic kidney disease: Secondary | ICD-10-CM | POA: Diagnosis not present

## 2016-10-07 DIAGNOSIS — Z992 Dependence on renal dialysis: Secondary | ICD-10-CM | POA: Diagnosis not present

## 2016-10-07 DIAGNOSIS — D509 Iron deficiency anemia, unspecified: Secondary | ICD-10-CM | POA: Diagnosis not present

## 2016-10-07 DIAGNOSIS — N186 End stage renal disease: Secondary | ICD-10-CM | POA: Diagnosis not present

## 2016-10-08 DIAGNOSIS — D631 Anemia in chronic kidney disease: Secondary | ICD-10-CM | POA: Diagnosis not present

## 2016-10-08 DIAGNOSIS — Z992 Dependence on renal dialysis: Secondary | ICD-10-CM | POA: Diagnosis not present

## 2016-10-08 DIAGNOSIS — N186 End stage renal disease: Secondary | ICD-10-CM | POA: Diagnosis not present

## 2016-10-08 DIAGNOSIS — D509 Iron deficiency anemia, unspecified: Secondary | ICD-10-CM | POA: Diagnosis not present

## 2016-10-09 DIAGNOSIS — N186 End stage renal disease: Secondary | ICD-10-CM | POA: Diagnosis not present

## 2016-10-09 DIAGNOSIS — D509 Iron deficiency anemia, unspecified: Secondary | ICD-10-CM | POA: Diagnosis not present

## 2016-10-09 DIAGNOSIS — Z992 Dependence on renal dialysis: Secondary | ICD-10-CM | POA: Diagnosis not present

## 2016-10-09 DIAGNOSIS — D631 Anemia in chronic kidney disease: Secondary | ICD-10-CM | POA: Diagnosis not present

## 2016-10-10 DIAGNOSIS — N186 End stage renal disease: Secondary | ICD-10-CM | POA: Diagnosis not present

## 2016-10-10 DIAGNOSIS — Z992 Dependence on renal dialysis: Secondary | ICD-10-CM | POA: Diagnosis not present

## 2016-10-10 DIAGNOSIS — D509 Iron deficiency anemia, unspecified: Secondary | ICD-10-CM | POA: Diagnosis not present

## 2016-10-10 DIAGNOSIS — D631 Anemia in chronic kidney disease: Secondary | ICD-10-CM | POA: Diagnosis not present

## 2016-10-11 DIAGNOSIS — D631 Anemia in chronic kidney disease: Secondary | ICD-10-CM | POA: Diagnosis not present

## 2016-10-11 DIAGNOSIS — Z992 Dependence on renal dialysis: Secondary | ICD-10-CM | POA: Diagnosis not present

## 2016-10-11 DIAGNOSIS — N186 End stage renal disease: Secondary | ICD-10-CM | POA: Diagnosis not present

## 2016-10-11 DIAGNOSIS — D509 Iron deficiency anemia, unspecified: Secondary | ICD-10-CM | POA: Diagnosis not present

## 2016-10-12 DIAGNOSIS — D509 Iron deficiency anemia, unspecified: Secondary | ICD-10-CM | POA: Diagnosis not present

## 2016-10-12 DIAGNOSIS — Z992 Dependence on renal dialysis: Secondary | ICD-10-CM | POA: Diagnosis not present

## 2016-10-12 DIAGNOSIS — N186 End stage renal disease: Secondary | ICD-10-CM | POA: Diagnosis not present

## 2016-10-12 DIAGNOSIS — D631 Anemia in chronic kidney disease: Secondary | ICD-10-CM | POA: Diagnosis not present

## 2016-10-13 DIAGNOSIS — Z992 Dependence on renal dialysis: Secondary | ICD-10-CM | POA: Diagnosis not present

## 2016-10-13 DIAGNOSIS — N186 End stage renal disease: Secondary | ICD-10-CM | POA: Diagnosis not present

## 2016-10-13 DIAGNOSIS — D631 Anemia in chronic kidney disease: Secondary | ICD-10-CM | POA: Diagnosis not present

## 2016-10-13 DIAGNOSIS — D509 Iron deficiency anemia, unspecified: Secondary | ICD-10-CM | POA: Diagnosis not present

## 2016-10-14 ENCOUNTER — Encounter: Payer: Self-pay | Admitting: Vascular Surgery

## 2016-10-14 DIAGNOSIS — D509 Iron deficiency anemia, unspecified: Secondary | ICD-10-CM | POA: Diagnosis not present

## 2016-10-14 DIAGNOSIS — D631 Anemia in chronic kidney disease: Secondary | ICD-10-CM | POA: Diagnosis not present

## 2016-10-14 DIAGNOSIS — N186 End stage renal disease: Secondary | ICD-10-CM | POA: Diagnosis not present

## 2016-10-14 DIAGNOSIS — Z992 Dependence on renal dialysis: Secondary | ICD-10-CM | POA: Diagnosis not present

## 2016-10-15 DIAGNOSIS — Z992 Dependence on renal dialysis: Secondary | ICD-10-CM | POA: Diagnosis not present

## 2016-10-15 DIAGNOSIS — N186 End stage renal disease: Secondary | ICD-10-CM | POA: Diagnosis not present

## 2016-10-15 DIAGNOSIS — D631 Anemia in chronic kidney disease: Secondary | ICD-10-CM | POA: Diagnosis not present

## 2016-10-15 DIAGNOSIS — D509 Iron deficiency anemia, unspecified: Secondary | ICD-10-CM | POA: Diagnosis not present

## 2016-10-16 DIAGNOSIS — D631 Anemia in chronic kidney disease: Secondary | ICD-10-CM | POA: Diagnosis not present

## 2016-10-16 DIAGNOSIS — N186 End stage renal disease: Secondary | ICD-10-CM | POA: Diagnosis not present

## 2016-10-16 DIAGNOSIS — D509 Iron deficiency anemia, unspecified: Secondary | ICD-10-CM | POA: Diagnosis not present

## 2016-10-16 DIAGNOSIS — Z992 Dependence on renal dialysis: Secondary | ICD-10-CM | POA: Diagnosis not present

## 2016-10-17 DIAGNOSIS — D631 Anemia in chronic kidney disease: Secondary | ICD-10-CM | POA: Diagnosis not present

## 2016-10-17 DIAGNOSIS — Z992 Dependence on renal dialysis: Secondary | ICD-10-CM | POA: Diagnosis not present

## 2016-10-17 DIAGNOSIS — D509 Iron deficiency anemia, unspecified: Secondary | ICD-10-CM | POA: Diagnosis not present

## 2016-10-17 DIAGNOSIS — N186 End stage renal disease: Secondary | ICD-10-CM | POA: Diagnosis not present

## 2016-10-18 DIAGNOSIS — N186 End stage renal disease: Secondary | ICD-10-CM | POA: Diagnosis not present

## 2016-10-18 DIAGNOSIS — D509 Iron deficiency anemia, unspecified: Secondary | ICD-10-CM | POA: Diagnosis not present

## 2016-10-18 DIAGNOSIS — Z992 Dependence on renal dialysis: Secondary | ICD-10-CM | POA: Diagnosis not present

## 2016-10-18 DIAGNOSIS — D631 Anemia in chronic kidney disease: Secondary | ICD-10-CM | POA: Diagnosis not present

## 2016-10-19 DIAGNOSIS — D631 Anemia in chronic kidney disease: Secondary | ICD-10-CM | POA: Diagnosis not present

## 2016-10-19 DIAGNOSIS — Z992 Dependence on renal dialysis: Secondary | ICD-10-CM | POA: Diagnosis not present

## 2016-10-19 DIAGNOSIS — N186 End stage renal disease: Secondary | ICD-10-CM | POA: Diagnosis not present

## 2016-10-19 DIAGNOSIS — D509 Iron deficiency anemia, unspecified: Secondary | ICD-10-CM | POA: Diagnosis not present

## 2016-10-20 ENCOUNTER — Telehealth: Payer: Self-pay | Admitting: *Deleted

## 2016-10-20 DIAGNOSIS — Z992 Dependence on renal dialysis: Secondary | ICD-10-CM | POA: Diagnosis not present

## 2016-10-20 DIAGNOSIS — D631 Anemia in chronic kidney disease: Secondary | ICD-10-CM | POA: Diagnosis not present

## 2016-10-20 DIAGNOSIS — D509 Iron deficiency anemia, unspecified: Secondary | ICD-10-CM | POA: Diagnosis not present

## 2016-10-20 DIAGNOSIS — N186 End stage renal disease: Secondary | ICD-10-CM | POA: Diagnosis not present

## 2016-10-20 NOTE — Addendum Note (Signed)
Addendum  created 10/20/16 1343 by Roberts Gaudy, MD   Sign clinical note

## 2016-10-20 NOTE — Telephone Encounter (Signed)
Called patient to remind him of his New Patient appointment at 1:30 with Dr. Alen Blew. He verbalized understanding.

## 2016-10-21 ENCOUNTER — Telehealth: Payer: Self-pay | Admitting: Oncology

## 2016-10-21 ENCOUNTER — Ambulatory Visit (HOSPITAL_BASED_OUTPATIENT_CLINIC_OR_DEPARTMENT_OTHER): Payer: Medicare Other | Admitting: Oncology

## 2016-10-21 VITALS — BP 154/82 | HR 68 | Temp 98.1°F | Resp 20 | Ht 70.0 in | Wt 248.6 lb

## 2016-10-21 DIAGNOSIS — J90 Pleural effusion, not elsewhere classified: Secondary | ICD-10-CM | POA: Diagnosis not present

## 2016-10-21 DIAGNOSIS — C641 Malignant neoplasm of right kidney, except renal pelvis: Secondary | ICD-10-CM

## 2016-10-21 DIAGNOSIS — N186 End stage renal disease: Secondary | ICD-10-CM | POA: Diagnosis not present

## 2016-10-21 DIAGNOSIS — Z992 Dependence on renal dialysis: Secondary | ICD-10-CM | POA: Diagnosis not present

## 2016-10-21 DIAGNOSIS — D631 Anemia in chronic kidney disease: Secondary | ICD-10-CM | POA: Diagnosis not present

## 2016-10-21 DIAGNOSIS — D509 Iron deficiency anemia, unspecified: Secondary | ICD-10-CM | POA: Diagnosis not present

## 2016-10-21 NOTE — Progress Notes (Signed)
Reason for Referral: Urothelial cancer.   HPI: [name] is a 66 year old gentleman currently of [place], [place] where he lived the majority of his life. He is a gentleman without any significant comorbid conditions I was diagnosed with urothelial carcinoma in 2014. At that time he presented with hematuria and a CT scan showed a 3.8 x 6.3 x 6.0 cm right renal mass. He underwent a right nephrectomy, ureterectomy and retroperitoneal lymph node dissection done by Dr. Brendia Sacks in July 2014. At that time. 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 He subsequently received chemotherapy with cisplatin and gemcitabine for 4 cycles concluded in November 2015 under the care of Dr. Thera Flake at [facility]. He is subsequently developed to the left ureter tumors in April 2016 and required ablation. He hadn't recurrent tumor in October 2016 and required repeat ablation of these tumors and the ureter. 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 History of recurrent disease and subsequently underwent complete resection of his left kidney, ureter and bladder in February 2017. He is pathology showed a Ta urothelial carcinoma and the ureter and bladder was 0 out of 9 lymph nodes involved. The procedure left him on dialysis and currently receiving peritoneal dialysis at home.  He started developing a recurrent pleural effusion in the last last year or so without any clear etiology. Imaging studies did not show any clear-cut malignancy and the lung although the pleural effusion might have obscured some of the lung parenchyma. His last thoracentesis was done on 10/06/2016 and prior to that it was in June 2018. His last CT scan was done in June 2018 which showed a focal consolidation in the right middle lobe adjacent to the pericardium measuring 3.2 x 2.3 cm. There is also increased right sided  pleural effusion.  Based on these findings patient was supposed to have repeat pleural effusions and subsequently repeat imaging studies and evaluation by pulmonary medicine at [facility]. His appointment was set for November 2018. He wanted to establish care at the [facility] at this time.  Clinically, he reports feeling reasonably fair at this time. He reports his breathing is reasonable especially after recent fluid removal. He denied any cough, wheezing or hemoptysis. He does report some mild dysphagia and at times. He continues to drive and attends to activities of daily living although he is not working at this time.  He does not report any headaches, blurry vision, syncope or seizures. He does not report any fevers, chills sweats or weight loss. He does not report any chest pain, palpitation, orthopnea or leg edema. He does not report any cough, hemoptysis. He does not report any nausea, vomiting or abdominal pain. He is not reporting frequency urgency or hesitancy. He does not report skeletal complaints. Rest of his review of systems unremarkable.   Past Medical History:  Diagnosis Date  . Anemia   . Anxiety   . Arthritis   . Cancer [facility])    bladder, ureter, Bil kidneys  . Dermatitis    scaly bump occurs every few months, pt uses cortizone cream and it goes away  . ESRD (end stage renal disease) on dialysis ([place])    Bilateral Nephrectomies- due to cancer  . History of blood transfusion   . Hypertension   . Lumbar back pain   . Mental disorder   . Pleural effusion, right  s/p right thoracentesis 10/07/15 (no malignancy by cytology report)  . Shortness of breath dyspnea    Lying down gets short of breath  :  Past Surgical History:  Procedure Laterality Date  . AV FISTULA PLACEMENT Right 06/05/2015   Procedure: ARTERIOVENOUS (AV) FISTULA CREATION RIGHT ARM;  Surgeon: Angelia Mould, MD;  Location: [place];  Service: Vascular;  Laterality: Right;  .  BLADDER SURGERY  04-06-2015   removal of bladder/ [facility]   . CAPD INSERTION N/A 06/24/2016   Procedure: LAPAROSCOPIC INSERTION CONTINUOUS AMBULATORY PERITONEAL DIALYSIS  (CAPD) CATHETER;  Surgeon: Clovis Riley, MD;  Location: [place];  Service: General;  Laterality: N/A;  . CARPAL TUNNEL RELEASE     left hand x 2; right hand x 1; right elbow  . CERVICAL FUSION     x 2  . EYE SURGERY Bilateral    Cataract removal  . FISTULA SUPERFICIALIZATION Right 10/30/2015   Procedure: SUPERFICIALIZATION BRACHIOCEPHALIC ARTERIOVENOUS FISTULA Right Arm;  Surgeon: Angelia Mould, MD;  Location: [place];  Service: Vascular;  Laterality: Right;  . Hemocatheter    . IR GENERIC HISTORICAL Right 04/29/2016   IR THROMBECTOMY AV FISTULA W/THROMBOLYSIS/PTA INC/SHUNT/IMG RIGHT 04/29/2016 Arne Cleveland, MD [facility]INTER[facility]  . IR GENERIC HISTORICAL  04/29/2016   IR US GUIDE VASC ACCESS RIGHT 04/29/2016 Arne Cleveland, MD [facility]  . KNEE ARTHROSCOPY Bilateral    bilateral  . KNEE ARTHROSCOPY WITH MEDIAL MENISECTOMY Left 02/26/2013   Procedure: KNEE ARTHROSCOPY WITH MEDIAL MENISECTOMY;  Surgeon: Garald Balding, MD;  Location: [place];  Service: Orthopedics;  Laterality: Left;  . LIGATION OF COMPETING BRANCHES OF ARTERIOVENOUS FISTULA Right 10/30/2015   Procedure: LIGATION OF COMPETING BRANCHES OF BRACHIOCEPHALIC ARTERIOVENOUS FISTULA;  Surgeon: Angelia Mould, MD;  Location: [place];  Service: Vascular;  Laterality: Right;  . NEPHRECTOMY Right September 12, 2012  . NEPHRECTOMY Left 04-06-2015   [place] Right 09/07/2015   Procedure: Fistulagram;  Surgeon: Angelia Mould, MD;  Location: [place] CV [facility];  Service: Cardiovascular;  Laterality: Right;  ARM  . PERIPHERAL VASCULAR CATHETERIZATION Right 09/07/2015   Procedure: Peripheral Vascular Balloon Angioplasty;  Surgeon: Angelia Mould, MD;  Location: [place] [facility];   Service: Cardiovascular;  Laterality: Right;  upper arm venous  . PROSTATECTOMY  04-06-2015   [place] Bilateral    bilateral  . TOTAL KNEE ARTHROPLASTY  01/24/2012   Procedure: TOTAL KNEE ARTHROPLASTY;  Surgeon: Garald Balding, MD;  Location: [place];  Service: Orthopedics;  Laterality: Right;  RIGHT TOTAL KNEE REPLACEMENT  :   Current Outpatient Prescriptions:  .  ALPRAZolam (XANAX) 1 MG tablet, Take 1 mg by mouth 2 (two) times daily as needed for anxiety. , Disp: , Rfl:  .  amLODipine (NORVASC) 10 MG tablet, Take 1 tablet (10 mg total) by mouth daily., Disp: 90 tablet, Rfl: 3 .  fluticasone (FLONASE) 50 MCG/ACT nasal spray, Place 1 spray into both nostrils daily. , Disp: , Rfl:  .  HYDROcodone-acetaminophen (NORCO/VICODIN) 5-325 MG tablet, Take 1 tablet by mouth every 6 (six) hours as needed for moderate pain., Disp: 30 tablet, Rfl: 0 .  metoprolol succinate (TOPROL-XL) 50 MG 24 hr tablet, Take 75 mg by mouth every evening., Disp: , Rfl:  .  Polyethyl Glycol-Propyl Glycol (SYSTANE OP), Place 1 drop into both eyes daily as needed (for dry, itchy eyes)., Disp: , Rfl: :  No Known Allergies:  Family History  Problem Relation Age of Onset  . Hypertension Father   . Atrial fibrillation Father   :  Social History   Social History  . Marital status: Widowed    Spouse name: N/A  . Number of children: N/A  . Years of education: N/A   Occupational History  . Not on file.   Social History Main Topics  . Smoking status: Never Smoker  . Smokeless tobacco: Never Used  . Alcohol use No  . Drug use: No  . Sexual activity: Not on file   Other Topics Concern  . Not on file   Social History Narrative  . No narrative on file  :  Pertinent items are noted in HPI.  Exam: Blood pressure (!) 154/82, pulse 68, temperature 98.1 F (36.7 C), temperature source Oral, resp. rate 20, height 5\' 10"  (1.778 m), weight 248 lb 9.6 oz (112.8 kg), SpO2  100 %.  ECOG 1  General appearance: alert and cooperative appeared without distress. Throat: No oral thrush or ulcers. Neck: no adenopathy Back: negative Resp: clear to auscultation bilaterally. Decreased breath sounds at the base of the right lung. Cardio: regular rate and rhythm, S1, S2 normal, no murmur, click, rub or gallop GI: soft, non-tender; bowel sounds normal; no masses,  no organomegaly Extremities: extremities normal, atraumatic, no cyanosis or edema Pulses: 2+ and symmetric  CBC    Component Value Date/Time   WBC 7.4 06/24/2016 1012   RBC 3.31 (L) 06/24/2016 1012   HGB 11.6 (L) 06/24/2016 1012   HCT 33.8 (L) 06/24/2016 1012   PLT 178 06/24/2016 1012   MCV 102.1 (H) 06/24/2016 1012   MCH 35.0 (H) 06/24/2016 1012   MCHC 34.3 06/24/2016 1012   RDW 13.7 06/24/2016 1012   LYMPHSABS 1.2 06/24/2016 1012   MONOABS 0.7 06/24/2016 1012   EOSABS 0.3 06/24/2016 1012   BASOSABS 0.0 06/24/2016 1012     Chemistry      Component Value Date/Time   NA 134 (L) 06/24/2016 1012   K 4.9 06/24/2016 1012   CL 96 (L) 06/24/2016 1012   CO2 27 06/24/2016 1012   BUN 32 (H) 06/24/2016 1012   CREATININE 8.94 (H) 06/24/2016 1012      Component Value Date/Time   CALCIUM 8.5 (L) 06/24/2016 1012   ALKPHOS 63 06/24/2016 1012   AST 12 (L) 06/24/2016 1012   ALT 10 (L) 06/24/2016 1012   BILITOT 0.6 06/24/2016 1012       Assessment and Plan:   66 year old gentleman with the following issues:  1.Urothelial carcinoma of the right collecting systempT3N0 stage 3: He is status post right nephrectomy, ureterectomy, and retroperitoneal lymph node dissection 08/2012.   He was offered treatment with adjuvant chemotherapy in 09/2012, but due to concerns of side effects he declined.   He developed hematuria in 08/2013 and on evaluation was found to have tumors in the right ureteral stump and left distal ureter. Biopsy showed high grade noninvasive papillary urothelial carcinoma. CT scan on 10/08/13  showed no evidence of metastatic disease. He completed 4 cycles of chemotherapy with cisplatin and gemcitabine on 12/30/13.  He n underwent biopsy and laser ablation of the left ureter and excision of the right distal ureter stump with bladder cuff and right PLND. Pathology showed high-grade, noninvasive papillary urothelial carcinoma, margins negative, and 4 negative lymph nodes.   He subsequently has developed multiple recurrences in the left ureter. In 04/2015 he underwent completion GU-ectomy.  He has  been under surveillance since that time without any systemic treatment. Last CT scan in June 2018 showed no evidence of disease but he does have a right middle lobe consolidation that might require further evaluation that was obscured potentially by the large effusion.   I recommended that potentially repeating imaging studies in the next 4-6 weeks once his pleural effusion is adequately drained to evaluate this right middle lobe consolidation.  2. Recurrent pleural effusion: He is status post multiple thoracentesis in the past most recently in August 2018. His cytology did not show any malignant cells. We will make the referral to pulmonary medicine locally based on his request for further evaluation and recommendation. He might require a Pleurx catheter if this issues becomes recurrent.  3. Follow-up: In the next few weeks to follow his progress. He expressed interest in switching his care to [facility] for convenience.

## 2016-10-21 NOTE — Telephone Encounter (Signed)
Gave avs and calendar for September appointment °

## 2016-10-22 DIAGNOSIS — Z992 Dependence on renal dialysis: Secondary | ICD-10-CM | POA: Diagnosis not present

## 2016-10-22 DIAGNOSIS — D509 Iron deficiency anemia, unspecified: Secondary | ICD-10-CM | POA: Diagnosis not present

## 2016-10-22 DIAGNOSIS — N186 End stage renal disease: Secondary | ICD-10-CM | POA: Diagnosis not present

## 2016-10-22 DIAGNOSIS — D631 Anemia in chronic kidney disease: Secondary | ICD-10-CM | POA: Diagnosis not present

## 2016-10-23 DIAGNOSIS — Z992 Dependence on renal dialysis: Secondary | ICD-10-CM | POA: Diagnosis not present

## 2016-10-23 DIAGNOSIS — D631 Anemia in chronic kidney disease: Secondary | ICD-10-CM | POA: Diagnosis not present

## 2016-10-23 DIAGNOSIS — D509 Iron deficiency anemia, unspecified: Secondary | ICD-10-CM | POA: Diagnosis not present

## 2016-10-23 DIAGNOSIS — N186 End stage renal disease: Secondary | ICD-10-CM | POA: Diagnosis not present

## 2016-10-24 DIAGNOSIS — N186 End stage renal disease: Secondary | ICD-10-CM | POA: Diagnosis not present

## 2016-10-24 DIAGNOSIS — D509 Iron deficiency anemia, unspecified: Secondary | ICD-10-CM | POA: Diagnosis not present

## 2016-10-24 DIAGNOSIS — Z992 Dependence on renal dialysis: Secondary | ICD-10-CM | POA: Diagnosis not present

## 2016-10-24 DIAGNOSIS — D631 Anemia in chronic kidney disease: Secondary | ICD-10-CM | POA: Diagnosis not present

## 2016-10-25 DIAGNOSIS — N186 End stage renal disease: Secondary | ICD-10-CM | POA: Diagnosis not present

## 2016-10-25 DIAGNOSIS — D509 Iron deficiency anemia, unspecified: Secondary | ICD-10-CM | POA: Diagnosis not present

## 2016-10-25 DIAGNOSIS — D631 Anemia in chronic kidney disease: Secondary | ICD-10-CM | POA: Diagnosis not present

## 2016-10-25 DIAGNOSIS — Z992 Dependence on renal dialysis: Secondary | ICD-10-CM | POA: Diagnosis not present

## 2016-10-26 ENCOUNTER — Other Ambulatory Visit: Payer: Self-pay

## 2016-10-26 ENCOUNTER — Ambulatory Visit (INDEPENDENT_AMBULATORY_CARE_PROVIDER_SITE_OTHER): Payer: Medicare Other | Admitting: Vascular Surgery

## 2016-10-26 ENCOUNTER — Ambulatory Visit (HOSPITAL_COMMUNITY)
Admission: RE | Admit: 2016-10-26 | Discharge: 2016-10-26 | Disposition: A | Discharge status: Home / Self Care | Payer: Medicare Other | Source: Ambulatory Visit | Attending: Vascular Surgery | Admitting: Vascular Surgery

## 2016-10-26 ENCOUNTER — Encounter: Payer: Self-pay | Admitting: Vascular Surgery

## 2016-10-26 VITALS — BP 126/70 | HR 87 | Temp 98.9°F | Resp 18 | Ht 70.0 in | Wt 242.0 lb

## 2016-10-26 DIAGNOSIS — N186 End stage renal disease: Secondary | ICD-10-CM | POA: Diagnosis not present

## 2016-10-26 DIAGNOSIS — I82611 Acute embolism and thrombosis of superficial veins of right upper extremity: Secondary | ICD-10-CM | POA: Diagnosis not present

## 2016-10-26 DIAGNOSIS — D631 Anemia in chronic kidney disease: Secondary | ICD-10-CM | POA: Diagnosis not present

## 2016-10-26 DIAGNOSIS — Z992 Dependence on renal dialysis: Secondary | ICD-10-CM

## 2016-10-26 DIAGNOSIS — D509 Iron deficiency anemia, unspecified: Secondary | ICD-10-CM | POA: Diagnosis not present

## 2016-10-26 NOTE — Progress Notes (Signed)
Established Dialysis Access   History of Present Illness   Jesus [name] is a 66 y.o. ([birth year]) male on peritoneal dialysis who cc: clotted R BC arteriovenous fistula. The patient is right hand dominant.  Previous access procedures have been completed in the right arm: R BC AVF, venoplasty R BC AVF, Superficialization and SBL R BC AVF.  The patient's complication from previous access procedures include: thrombosis.  The patient has never had a previous PPM placed.  Past Medical History:  Diagnosis Date  . Anemia   . Anxiety   . Arthritis   . Cancer [facility])    bladder, ureter, Bil kidneys  . Dermatitis    scaly bump occurs every few months, pt uses cortizone cream and it goes away  . ESRD (end stage renal disease) on dialysis ([place])    Bilateral Nephrectomies- due to cancer  . History of blood transfusion   . Hypertension   . Lumbar back pain   . Mental disorder   . Pleural effusion, right    s/p right thoracentesis 10/07/15 (no malignancy by cytology report)  . Shortness of breath dyspnea    Lying down gets short of breath    Past Surgical History:  Procedure Laterality Date  . AV FISTULA PLACEMENT Right 06/05/2015   Procedure: ARTERIOVENOUS (AV) FISTULA CREATION RIGHT ARM;  Surgeon: Angelia Mould, MD;  Location: [place];  Service: Vascular;  Laterality: Right;  . BLADDER SURGERY  04-06-2015   removal of bladder/ [facility]   . CAPD INSERTION N/A 06/24/2016   Procedure: LAPAROSCOPIC INSERTION CONTINUOUS AMBULATORY PERITONEAL DIALYSIS  (CAPD) CATHETER;  Surgeon: Clovis Riley, MD;  Location: [place];  Service: General;  Laterality: N/A;  . CARPAL TUNNEL RELEASE     left hand x 2; right hand x 1; right elbow  . CERVICAL FUSION     x 2  . EYE SURGERY Bilateral    Cataract removal  . FISTULA SUPERFICIALIZATION Right 10/30/2015   Procedure: SUPERFICIALIZATION BRACHIOCEPHALIC ARTERIOVENOUS FISTULA Right Arm;  Surgeon: Angelia Mould, MD;  Location: [place];  Service: Vascular;  Laterality: Right;  . Hemocatheter    . IR GENERIC HISTORICAL Right 04/29/2016   IR THROMBECTOMY AV FISTULA W/THROMBOLYSIS/PTA INC/SHUNT/IMG RIGHT 04/29/2016 Arne Cleveland, MD [facility]-INTER[facility] RAD  . IR GENERIC HISTORICAL  04/29/2016   IR US GUIDE VASC ACCESS RIGHT 04/29/2016 Arne Cleveland, MD [facility]INTER[facility] RAD  . KNEE ARTHROSCOPY Bilateral    bilateral  . KNEE ARTHROSCOPY WITH MEDIAL MENISECTOMY Left 02/26/2013   Procedure: KNEE ARTHROSCOPY WITH MEDIAL MENISECTOMY;  Surgeon: Garald Balding, MD;  Location: [place];  Service: Orthopedics;  Laterality: Left;  . LIGATION OF COMPETING BRANCHES OF ARTERIOVENOUS FISTULA Right 10/30/2015   Procedure: LIGATION OF COMPETING BRANCHES OF BRACHIOCEPHALIC ARTERIOVENOUS FISTULA;  Surgeon: Angelia Mould, MD;  Location: [place];  Service: Vascular;  Laterality: Right;  . NEPHRECTOMY Right September 12, 2012  . NEPHRECTOMY Left 04-06-2015   [place] Right 09/07/2015   Procedure: Fistulagram;  Surgeon: Angelia Mould, MD;  Location: [place] CV LAB;  Service: Cardiovascular;  Laterality: Right;  ARM  . PERIPHERAL VASCULAR CATHETERIZATION Right 09/07/2015   Procedure: Peripheral Vascular Balloon Angioplasty;  Surgeon: Angelia Mould, MD;  Location: [place] CV LAB;  Service: Cardiovascular;  Laterality: Right;  upper arm venous  . PROSTATECTOMY  04-06-2015   [place] Bilateral  bilateral  . TOTAL KNEE ARTHROPLASTY  01/24/2012   Procedure: TOTAL KNEE ARTHROPLASTY;  Surgeon: Garald Balding, MD;  Location: [place];  Service: Orthopedics;  Laterality: Right;  RIGHT TOTAL KNEE REPLACEMENT    Social History   Social History  . Marital status: Widowed    Spouse name: N/A  . Number of children: N/A  . Years of education: N/A   Occupational History  . Not on file.   Social History Main Topics  . Smoking status: Never Smoker    . Smokeless tobacco: Never Used  . Alcohol use No  . Drug use: No  . Sexual activity: Not on file   Other Topics Concern  . Not on file   Social History Narrative  . No narrative on file     Family History  Problem Relation Age of Onset  . Hypertension Father   . Atrial fibrillation Father     Current Outpatient Prescriptions  Medication Sig Dispense Refill  . ALPRAZolam (XANAX) 1 MG tablet Take 1 mg by mouth 2 (two) times daily as needed for anxiety.     . fluticasone (FLONASE) 50 MCG/ACT nasal spray Place 1 spray into both nostrils daily.     Marland Kitchen HYDROcodone-acetaminophen (NORCO/VICODIN) 5-325 MG tablet Take 1 tablet by mouth every 6 (six) hours as needed for moderate pain. 30 tablet 0  . oxyCODONE-acetaminophen (PERCOCET/ROXICET) 5-325 MG tablet     . Polyethyl Glycol-Propyl Glycol (SYSTANE OP) Place 1 drop into both eyes daily as needed (for dry, itchy eyes).    Marland Kitchen amLODipine (NORVASC) 10 MG tablet Take 1 tablet (10 mg total) by mouth daily. 90 tablet 3  . metoprolol succinate (TOPROL-XL) 50 MG 24 hr tablet Take 75 mg by mouth every evening.     No current facility-administered medications for this visit.      No Known Allergies   REVIEW OF SYSTEMS (negative unless checked):   Cardiac:  []  Chest pain or chest pressure? []  Shortness of breath upon activity? []  Shortness of breath when lying flat? []  Irregular heart rhythm?  Vascular:  []  Pain in calf, thigh, or hip brought on by walking? []  Pain in feet at night that wakes you up from your sleep? []  Blood clot in your veins? []  Leg swelling?  Pulmonary:  []  Oxygen at home? []  Productive cough? []  Wheezing?  Neurologic:  []  Sudden weakness in arms or legs? []  Sudden numbness in arms or legs? []  Sudden onset of difficult speaking or slurred speech? []  Temporary loss of vision in one eye? []  Problems with dizziness?  Gastrointestinal:  []  Blood in stool? []  Vomited blood?  Genitourinary:  []  Burning  when urinating? []  Blood in urine?  Psychiatric:  []  Major depression  Hematologic:  []  Bleeding problems? []  Problems with blood clotting?  Dermatologic:  []  Rashes or ulcers?  Constitutional:  []  Fever or chills?  Ear/Nose/Throat:  []  Change in hearing? []  Nose bleeds? []  Sore throat?  Musculoskeletal:  []  Back pain? []  Joint pain? []  Muscle pain?   Physical Examination   Vitals:   10/26/16 1026  BP: 126/70  Pulse: 87  Resp: 18  Temp: 98.9 F (37.2 C)  SpO2: 100%  Weight: 242 lb (109.8 kg)  Height: 5\' 10"  (1.778 m)   Body mass index is 34.72 kg/m.  General Alert, O x 3, WD, NAD  Pulmonary Sym exp, good B air movt, CTA B  Cardiac RRR, Nl S1, S2, no Murmurs, No rubs, No S3,S4  Vascular  Vessel Right Left  Radial Palpable Palpable  Brachial Palpable Palpable  Ulnar Not palpable Not palpable    Musculo- skeletal M/S 5/5 throughout  , Extremities without ischemic changes, large distal R cephalic vein, visible L cephalic vein and proximal L basilic vein    Neurologic Pain and light touch intact in extremities , Motor exam as listed above     Non-invasive Vascular Imaging    BUE Vein Mapping  (05/20/15):   R arm: acceptable vein conduits include upper arm basilic vein  L arm: acceptable vein conduits include upper arm basilic vein  BUE Vein Mapping  (10/26/2016 ):   R arm: acceptable vein conduits include upper arm basilic vein (antecubital segment might be small)  L arm: acceptable vein conduits include upper arm basilic vein and upper arm cephalic vein, second forearm vein draining into cubital vein appears to be adequate down to wrist   Medical Decision Making   SARAH ZERBY is a 66 y.o. male who presents with ESRD on peritoneal dialysis   Veins looked much larger than previous described on 05/20/15.  New veins mapping obtained today.  Based on vein mapping and examination, this patient's permanent hemodialysis access options include: right  staged basilic vein transposition, L RC vs BC AVF, L staged basilic vein transposition  I would first proceed with left RC vs BC AVF.  I had an extensive discussion with this patient in regards to the nature of access surgery, including risk, benefits, and alternatives.    The patient is aware that the risks of access surgery include but are not limited to: bleeding, infection, steal syndrome, nerve damage, ischemic monomelic neuropathy, failure of access to mature, and possible need for additional access procedures in the future.  The patient is aware that I construct transposition procedures in two stages, requiring a separate operation to complete the procedure.  The patient has agreed to proceed with the above procedure which will be scheduled 5 SEP 18.   Adele Barthel, MD, FACS [facility] Office: [phone] Pager: [phone]

## 2016-10-27 DIAGNOSIS — N186 End stage renal disease: Secondary | ICD-10-CM | POA: Diagnosis not present

## 2016-10-27 DIAGNOSIS — Z992 Dependence on renal dialysis: Secondary | ICD-10-CM | POA: Diagnosis not present

## 2016-10-27 DIAGNOSIS — D631 Anemia in chronic kidney disease: Secondary | ICD-10-CM | POA: Diagnosis not present

## 2016-10-27 DIAGNOSIS — D509 Iron deficiency anemia, unspecified: Secondary | ICD-10-CM | POA: Diagnosis not present

## 2016-10-28 DIAGNOSIS — D509 Iron deficiency anemia, unspecified: Secondary | ICD-10-CM | POA: Diagnosis not present

## 2016-10-28 DIAGNOSIS — Z992 Dependence on renal dialysis: Secondary | ICD-10-CM | POA: Diagnosis not present

## 2016-10-28 DIAGNOSIS — D631 Anemia in chronic kidney disease: Secondary | ICD-10-CM | POA: Diagnosis not present

## 2016-10-28 DIAGNOSIS — N186 End stage renal disease: Secondary | ICD-10-CM | POA: Diagnosis not present

## 2016-10-29 DIAGNOSIS — Z992 Dependence on renal dialysis: Secondary | ICD-10-CM | POA: Diagnosis not present

## 2016-10-29 DIAGNOSIS — N186 End stage renal disease: Secondary | ICD-10-CM | POA: Diagnosis not present

## 2016-10-29 DIAGNOSIS — D509 Iron deficiency anemia, unspecified: Secondary | ICD-10-CM | POA: Diagnosis not present

## 2016-10-29 DIAGNOSIS — D631 Anemia in chronic kidney disease: Secondary | ICD-10-CM | POA: Diagnosis not present

## 2016-10-30 DIAGNOSIS — D509 Iron deficiency anemia, unspecified: Secondary | ICD-10-CM | POA: Diagnosis not present

## 2016-10-30 DIAGNOSIS — N186 End stage renal disease: Secondary | ICD-10-CM | POA: Diagnosis not present

## 2016-10-30 DIAGNOSIS — D631 Anemia in chronic kidney disease: Secondary | ICD-10-CM | POA: Diagnosis not present

## 2016-10-30 DIAGNOSIS — Z992 Dependence on renal dialysis: Secondary | ICD-10-CM | POA: Diagnosis not present

## 2016-10-31 DIAGNOSIS — D509 Iron deficiency anemia, unspecified: Secondary | ICD-10-CM | POA: Diagnosis not present

## 2016-10-31 DIAGNOSIS — D631 Anemia in chronic kidney disease: Secondary | ICD-10-CM | POA: Diagnosis not present

## 2016-10-31 DIAGNOSIS — Z992 Dependence on renal dialysis: Secondary | ICD-10-CM | POA: Diagnosis not present

## 2016-10-31 DIAGNOSIS — N186 End stage renal disease: Secondary | ICD-10-CM | POA: Diagnosis not present

## 2016-11-01 ENCOUNTER — Telehealth: Payer: Self-pay | Admitting: *Deleted

## 2016-11-01 ENCOUNTER — Encounter: Payer: Self-pay | Admitting: *Deleted

## 2016-11-01 ENCOUNTER — Telehealth: Payer: Self-pay

## 2016-11-01 DIAGNOSIS — N186 End stage renal disease: Secondary | ICD-10-CM | POA: Diagnosis not present

## 2016-11-01 DIAGNOSIS — Z992 Dependence on renal dialysis: Secondary | ICD-10-CM | POA: Diagnosis not present

## 2016-11-01 DIAGNOSIS — D631 Anemia in chronic kidney disease: Secondary | ICD-10-CM | POA: Diagnosis not present

## 2016-11-01 DIAGNOSIS — D509 Iron deficiency anemia, unspecified: Secondary | ICD-10-CM | POA: Diagnosis not present

## 2016-11-01 NOTE — Telephone Encounter (Signed)
Patient calling to say the pulmonologist that dr Alen Blew referred him to is unable to see him until oct 10th. Will move f/u with dr Alen Blew, after oct 10th. Request sent to schedulers

## 2016-11-01 NOTE — Telephone Encounter (Signed)
rec'd phone call from pt. stating he wants to cancel his procedure on 11/08/16.  He stated he isn't sure that he even wants to go through with it, and will call back if he changes his mind.

## 2016-11-02 ENCOUNTER — Telehealth: Payer: Self-pay | Admitting: Oncology

## 2016-11-02 ENCOUNTER — Ambulatory Visit (INDEPENDENT_AMBULATORY_CARE_PROVIDER_SITE_OTHER): Payer: Medicare Other | Admitting: Family Medicine

## 2016-11-02 ENCOUNTER — Encounter: Payer: Self-pay | Admitting: Family Medicine

## 2016-11-02 VITALS — BP 138/86 | HR 84 | Temp 98.4°F | Resp 20 | Ht 70.0 in | Wt 243.0 lb

## 2016-11-02 DIAGNOSIS — D509 Iron deficiency anemia, unspecified: Secondary | ICD-10-CM | POA: Diagnosis not present

## 2016-11-02 DIAGNOSIS — N186 End stage renal disease: Secondary | ICD-10-CM | POA: Diagnosis not present

## 2016-11-02 DIAGNOSIS — C649 Malignant neoplasm of unspecified kidney, except renal pelvis: Secondary | ICD-10-CM

## 2016-11-02 DIAGNOSIS — J90 Pleural effusion, not elsewhere classified: Secondary | ICD-10-CM | POA: Diagnosis not present

## 2016-11-02 DIAGNOSIS — Z992 Dependence on renal dialysis: Secondary | ICD-10-CM

## 2016-11-02 DIAGNOSIS — Z23 Encounter for immunization: Secondary | ICD-10-CM

## 2016-11-02 DIAGNOSIS — D638 Anemia in other chronic diseases classified elsewhere: Secondary | ICD-10-CM

## 2016-11-02 DIAGNOSIS — D649 Anemia, unspecified: Secondary | ICD-10-CM | POA: Insufficient documentation

## 2016-11-02 DIAGNOSIS — D631 Anemia in chronic kidney disease: Secondary | ICD-10-CM | POA: Diagnosis not present

## 2016-11-02 MED ORDER — CITALOPRAM HYDROBROMIDE 20 MG PO TABS: 20.0000 mg | ORAL_TABLET | Freq: Every day | ORAL | 3 refills | Status: DC

## 2016-11-02 NOTE — Progress Notes (Signed)
Patient ID: Jesus [name], male    DOB: October 31, 1950, 66 y.o.   MRN: [id number]  Chief Complaint  Patient presents with  . Hypertension    Allergies Patient has no known allergies.  Subjective:   Jesus [name] is a 66 y.o. male who presents to [facility] today.  HPI Reports that is here b/c does not feel good. Reports that does not get to do anything for fun b/c feels bad. Reports that has chronic problems. Reports that muscles in arm can tighten up and hurt. Reports that may be due to neck problems. If bends neck back or forward gets neck symptoms, has been followed by Reports that has had neck fusion surgery and told nothing could be done for neck.  If walks uphill feels winded. Can walk around normally but then cannot walk up hills without getting short of breath.   Reports that has had to have fluid taken off right lung multiple times. Wants to know what is wrong with lungs. Finally got an appointment with pulmonary doctor on 12/07/16. Reports that has had over 20 surgeries in the past 10 years.   Gets dialysis by [facility]. They deliver all supplies and do blood work once a month. Has appointment with nephrologist once a month, he is the doctor with [facility]. Wants to change dialysis center to the one in [place]. Wants to be seen by [facility]. Wants to switch doctors b/c reports that does not like him.  Reports that has places on scalp that has had for years. Reports that the places on scalp have not gotten better. Has appointment on Friday at [facility]. Has been using clobetasol cream that the dermatologist has given him.  Reports that saw a cardiologist and had his heart checked out and heart was normal.  Had fluid taken off lungs 09/2016.   Reports that has not taken BP in over a month b/c it has been running low at times. Checks BP before starting home dialysis.   Reports that wants to have all his doctors at [facility]. Lives in [place].  Saw Dr.  Alen Blew at [facility], liked the doctor but wants to be seen close to home.  Has been referred to pulmonary in [place], [place] with [facility]. Would like for it to be here in [place]. Appt is 12/07/16 Has had the fluid drawn off lungs four times, but does not know why it keeps accumulating. The heart doctor told him it was not heart or would be accumulating in both lungs.  Would like to be seen by [facility] and be seen in [place], [place].   Brings in labs from last month by nephrology. Will scan into chart.  Denies suicidal ideations and reports that would not hurt self because would worry about his dogs and who would take care of them. Reports that he wants to feel better.    Depression         The patient presents with no depression.  This is a new problem.  The current episode started more than 1 month ago.   The onset quality is gradual.   The problem occurs daily.  The problem has been waxing and waning since onset.  Associated symptoms include fatigue, hopelessness, irritable and sad.  Associated symptoms include no decreased concentration, no helplessness, does not have insomnia, no restlessness, no decreased interest, no appetite change, no body aches, no myalgias, no headaches, no indigestion and no suicidal ideas.     The  symptoms are aggravated by social issues.  Past treatments include nothing.  Risk factors include a recent illness.   Past medical history includes chronic pain, chronic illness and recent illness.     Pertinent negatives include no chronic fatigue syndrome, no fibromyalgia, no hypothyroidism, no thyroid problem, no dementia, no anxiety, no eating disorder, no depression, no mental health disorder, no obsessive-compulsive disorder, no suicide attempts and no head trauma.   Past Medical History:  Diagnosis Date  . Anemia   . Anxiety   . Arthritis   . 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 Dermatitis    scaly bump occurs every few months, pt uses cortizone  cream and it goes away  . ESRD (end stage renal disease) on dialysis ([place])    Bilateral Nephrectomies- due to cancer  . History of blood transfusion   . Hypertension   . Lumbar back pain   . Mental disorder   . Pleural effusion, right    s/p right thoracentesis 10/07/15 (no malignancy by cytology report)  . Shortness of breath dyspnea    Lying down gets short of breath    Past Surgical History:  Procedure Laterality Date  . AV FISTULA PLACEMENT Right 06/05/2015   Procedure: ARTERIOVENOUS (AV) FISTULA CREATION RIGHT ARM;  Surgeon: Angelia Mould, MD;  Location: [place];  Service: Vascular;  Laterality: Right;  . BLADDER SURGERY  04-06-2015   removal of bladder/ [facility]   . CAPD INSERTION N/A 06/24/2016   Procedure: LAPAROSCOPIC INSERTION CONTINUOUS AMBULATORY PERITONEAL DIALYSIS  (CAPD) CATHETER;  Surgeon: Clovis Riley, MD;  Location: [place];  Service: General;  Laterality: N/A;  . CARPAL TUNNEL RELEASE     left hand x 2; right hand x 1; right elbow  . CERVICAL FUSION     x 2  . EYE SURGERY Bilateral    Cataract removal  . FISTULA SUPERFICIALIZATION Right 10/30/2015   Procedure: SUPERFICIALIZATION BRACHIOCEPHALIC ARTERIOVENOUS FISTULA Right Arm;  Surgeon: Angelia Mould, MD;  Location: [place];  Service: Vascular;  Laterality: Right;  . Hemocatheter    . IR GENERIC HISTORICAL Right 04/29/2016   IR THROMBECTOMY AV FISTULA W/THROMBOLYSIS/PTA INC/SHUNT/IMG RIGHT 04/29/2016 Arne Cleveland, MD [facility]INTER[facility]  . IR GENERIC HISTORICAL  04/29/2016   IR US GUIDE VASC ACCESS RIGHT 04/29/2016 Arne Cleveland, MD [facility]  . KNEE ARTHROSCOPY Bilateral    bilateral  . KNEE ARTHROSCOPY WITH MEDIAL MENISECTOMY Left 02/26/2013   Procedure: KNEE ARTHROSCOPY WITH MEDIAL MENISECTOMY;  Surgeon: Garald Balding, MD;  Location: [place];  Service: Orthopedics;  Laterality: Left;  . LIGATION OF COMPETING BRANCHES OF ARTERIOVENOUS FISTULA Right 10/30/2015   Procedure: LIGATION OF  COMPETING BRANCHES OF BRACHIOCEPHALIC ARTERIOVENOUS FISTULA;  Surgeon: Angelia Mould, MD;  Location: [place];  Service: Vascular;  Laterality: Right;  . NEPHRECTOMY Right September 12, 2012  . NEPHRECTOMY Left 04-06-2015   [place] Right 09/07/2015   Procedure: Fistulagram;  Surgeon: Angelia Mould, MD;  Location: [place] CV LAB;  Service: Cardiovascular;  Laterality: Right;  ARM  . PERIPHERAL VASCULAR CATHETERIZATION Right 09/07/2015   Procedure: Peripheral Vascular Balloon Angioplasty;  Surgeon: Angelia Mould, MD;  Location: [place] CV LAB;  Service: Cardiovascular;  Laterality: Right;  upper arm venous  . PROSTATECTOMY  04-06-2015   [place] Bilateral    bilateral  . TOTAL KNEE ARTHROPLASTY  01/24/2012  Procedure: TOTAL KNEE ARTHROPLASTY;  Surgeon: Garald Balding, MD;  Location: [place];  Service: Orthopedics;  Laterality: Right;  RIGHT TOTAL KNEE REPLACEMENT    Family History  Problem Relation Age of Onset  . Cancer Mother   . Hypertension Father   . Atrial fibrillation Father   . Heart disease Father      Social History   Social History  . Marital status: Widowed    Spouse name: N/A  . Number of children: N/A  . Years of education: N/A   Social History Main Topics  . Smoking status: Never Smoker  . Smokeless tobacco: Never Used  . Alcohol use No  . Drug use: No  . Sexual activity: Not Asked   Other Topics Concern  . None   Social History Narrative   Lives alone. Manages well per patient report. Does not cook much. Eats out a lot. Eats all food groups. Does not eat much fruit. Married in the past. Has one son. Used to drive a truck.     Outpatient Medications Prior to Visit  Medication Sig Dispense Refill  . ALPRAZolam (XANAX) 1 MG tablet Take 1 mg by mouth 2 (two) times daily as needed for anxiety.     . fluticasone (FLONASE) 50 MCG/ACT  nasal spray Place 1 spray into both nostrils daily.     Marland Kitchen oxyCODONE-acetaminophen (PERCOCET/ROXICET) 5-325 MG tablet     . Polyethyl Glycol-Propyl Glycol (SYSTANE OP) Place 1 drop into both eyes daily as needed (for dry, itchy eyes).    Marland Kitchen amLODipine (NORVASC) 10 MG tablet Take 1 tablet (10 mg total) by mouth daily. 90 tablet 3  . HYDROcodone-acetaminophen (NORCO/VICODIN) 5-325 MG tablet Take 1 tablet by mouth every 6 (six) hours as needed for moderate pain. (Patient not taking: Reported on 11/02/2016) 30 tablet 0  . metoprolol succinate (TOPROL-XL) 50 MG 24 hr tablet Take 75 mg by mouth every evening.     No facility-administered medications prior to visit.     Review of Systems  Constitutional: Positive for fatigue. Negative for appetite change.  Musculoskeletal: Negative for myalgias.  Neurological: Negative for headaches.  Psychiatric/Behavioral: Positive for depression and dysphoric mood. Negative for decreased concentration, sleep disturbance and suicidal ideas. The patient is not nervous/anxious and does not have insomnia.      Objective:   BP 138/86 (BP Location: Left Arm, Patient Position: Sitting, Cuff Size: Normal)   Pulse 84   Temp 98.4 F (36.9 C) (Temporal)   Resp 20   Ht 5\' 10"  (1.778 m)   Wt 243 lb 0.6 oz (110.2 kg)   SpO2 98%   BMI 34.87 kg/m   Physical Exam  Constitutional: He is oriented to person, place, and time. He appears well-developed and well-nourished. He is irritable.  Neck: Normal range of motion. Neck supple. No thyromegaly present.  Cardiovascular: Normal rate, regular rhythm and normal heart sounds.   Pulmonary/Chest: Effort normal and breath sounds normal.  Musculoskeletal: Normal range of motion.  Neurological: He is alert and oriented to person, place, and time. No cranial nerve deficit. Coordination normal.  Skin: Skin is warm and dry.  Psychiatric: Judgment and thought content normal. His mood appears not anxious. His affect is not blunt, not  labile and not inappropriate. His speech is not rapid and/or pressured and not slurred. He is not agitated and not slowed. Thought content is not paranoid. Cognition and memory are normal. Cognition and memory are not impaired. He does not express impulsivity or inappropriate  judgment. He exhibits a depressed mood. He expresses no homicidal and no suicidal ideation. He expresses no suicidal plans and no homicidal plans. He is communicative. He exhibits normal recent memory and normal remote memory. He is attentive.     Assessment and Plan   1. ESRD on dialysis [facility]) Patient request to be seen at [facility] in [place]. Will request labs that were done yesterday.  - Ambulatory referral to Nephrology Request labs from Dr. Lowanda Foster [place]/ [facility]. Evelena Leyden is nurse that follows his home dialysis. [phone], had labs drawn yesterday.  2. Pleural effusion Uncertain etiology. Patient has had cardiac evaluation which was negative,and if was cardiac should be bilateral effusions. Reports has had a lung scan that I do not have access to.  Would like to see Dr. Luan Pulling. Referral placed.  - Ambulatory referral to Pulmonology  3. Anemia in other chronic diseases classified elsewhere Will get consult from nephrology and check labs pending the labs.   4. Need for influenza vaccination  - Flu Vaccine QUAD 36+ mos IM  5. Renal cell carcinoma, unspecified laterality [facility]) [facility]erral to [facility].  - Ambulatory referral to Hematology / Oncology  6.  Depression, never treated.  Start Celexa 20 mg po qd.  Patient counseled in detail regarding the risks of medication. Told to call or return to clinic if develop any worrisome signs or symptoms. Patient voiced understanding.  Suicide precautions given.  Patient voiced that had hope that things would be getting better.   7. HTN-agree to hold medications at this time and suspect that BP changes are due to fluid  shifts related to dialysis.  Monitor BP and call with problems.  Caren Macadam, MD 11/02/2016

## 2016-11-02 NOTE — Telephone Encounter (Signed)
Scheduled appt per sch message 9/4 - left message for patient with appt date and time and sent reminder letter in the mail.

## 2016-11-02 NOTE — Progress Notes (Deleted)
Patient ID: Jesus [name], male    DOB: 1950-07-07, 66 y.o.   MRN: [id number]  No chief complaint on file.   Allergies Patient has no known allergies.  Subjective:   Jesus [name] is a 66 y.o. male who presents to [facility] today.  HPI HPI  Past Medical History:  Diagnosis Date  . Anemia   . Anxiety   . Arthritis   . Cancer [facility])    bladder, ureter, Bil kidneys  . Dermatitis    scaly bump occurs every few months, pt uses cortizone cream and it goes away  . ESRD (end stage renal disease) on dialysis ([place])    Bilateral Nephrectomies- due to cancer  . History of blood transfusion   . Hypertension   . Lumbar back pain   . Mental disorder   . Pleural effusion, right    s/p right thoracentesis 10/07/15 (no malignancy by cytology report)  . Shortness of breath dyspnea    Lying down gets short of breath    Past Surgical History:  Procedure Laterality Date  . AV FISTULA PLACEMENT Right 06/05/2015   Procedure: ARTERIOVENOUS (AV) FISTULA CREATION RIGHT ARM;  Surgeon: Angelia Mould, MD;  Location: [place];  Service: Vascular;  Laterality: Right;  . BLADDER SURGERY  04-06-2015   removal of bladder/ [facility]   . CAPD INSERTION N/A 06/24/2016   Procedure: LAPAROSCOPIC INSERTION CONTINUOUS AMBULATORY PERITONEAL DIALYSIS  (CAPD) CATHETER;  Surgeon: Clovis Riley, MD;  Location: [place];  Service: General;  Laterality: N/A;  . CARPAL TUNNEL RELEASE     left hand x 2; right hand x 1; right elbow  . CERVICAL FUSION     x 2  . EYE SURGERY Bilateral    Cataract removal  . FISTULA SUPERFICIALIZATION Right 10/30/2015   Procedure: SUPERFICIALIZATION BRACHIOCEPHALIC ARTERIOVENOUS FISTULA Right Arm;  Surgeon: Angelia Mould, MD;  Location: [place];  Service: Vascular;  Laterality: Right;  . Hemocatheter    . IR GENERIC HISTORICAL Right 04/29/2016   IR THROMBECTOMY AV FISTULA W/THROMBOLYSIS/PTA INC/SHUNT/IMG RIGHT 04/29/2016 Arne Cleveland, MD [facility]-INTERV  RAD  . IR GENERIC HISTORICAL  04/29/2016   IR US GUIDE VASC ACCESS RIGHT 04/29/2016 Arne Cleveland, MD [facility]-INTERV RAD  . KNEE ARTHROSCOPY Bilateral    bilateral  . KNEE ARTHROSCOPY WITH MEDIAL MENISECTOMY Left 02/26/2013   Procedure: KNEE ARTHROSCOPY WITH MEDIAL MENISECTOMY;  Surgeon: Garald Balding, MD;  Location: [place];  Service: Orthopedics;  Laterality: Left;  . LIGATION OF COMPETING BRANCHES OF ARTERIOVENOUS FISTULA Right 10/30/2015   Procedure: LIGATION OF COMPETING BRANCHES OF BRACHIOCEPHALIC ARTERIOVENOUS FISTULA;  Surgeon: Angelia Mould, MD;  Location: [place];  Service: Vascular;  Laterality: Right;  . NEPHRECTOMY Right September 12, 2012  . NEPHRECTOMY Left 04-06-2015   [place] Right 09/07/2015   Procedure: Fistulagram;  Surgeon: Angelia Mould, MD;  Location: [place] CV LAB;  Service: Cardiovascular;  Laterality: Right;  ARM  . PERIPHERAL VASCULAR CATHETERIZATION Right 09/07/2015   Procedure: Peripheral Vascular Balloon Angioplasty;  Surgeon: Angelia Mould, MD;  Location: [place] CV LAB;  Service: Cardiovascular;  Laterality: Right;  upper arm venous  . PROSTATECTOMY  04-06-2015   [place] Bilateral    bilateral  . TOTAL KNEE ARTHROPLASTY  01/24/2012   Procedure: TOTAL KNEE ARTHROPLASTY;  Surgeon: Garald Balding, MD;  Location: [place];  Service: Orthopedics;  Laterality: Right;  RIGHT TOTAL KNEE REPLACEMENT    Family History  Problem Relation Age of Onset  . Hypertension Father   . Atrial fibrillation Father      Social History   Social History  . Marital status: Widowed    Spouse name: N/A  . Number of children: N/A  . Years of education: N/A   Social History Main Topics  . Smoking status: Never Smoker  . Smokeless tobacco: Never Used  . Alcohol use No  . Drug use: No  . Sexual activity: Not on file   Other Topics Concern  . Not on file     Social History Narrative  . No narrative on file    Outpatient Medications Prior to Visit  Medication Sig Dispense Refill  . ALPRAZolam (XANAX) 1 MG tablet Take 1 mg by mouth 2 (two) times daily as needed for anxiety.     Marland Kitchen amLODipine (NORVASC) 10 MG tablet Take 1 tablet (10 mg total) by mouth daily. 90 tablet 3  . fluticasone (FLONASE) 50 MCG/ACT nasal spray Place 1 spray into both nostrils daily.     Marland Kitchen HYDROcodone-acetaminophen (NORCO/VICODIN) 5-325 MG tablet Take 1 tablet by mouth every 6 (six) hours as needed for moderate pain. 30 tablet 0  . metoprolol succinate (TOPROL-XL) 50 MG 24 hr tablet Take 75 mg by mouth every evening.    Marland Kitchen oxyCODONE-acetaminophen (PERCOCET/ROXICET) 5-325 MG tablet     . Polyethyl Glycol-Propyl Glycol (SYSTANE OP) Place 1 drop into both eyes daily as needed (for dry, itchy eyes).     No facility-administered medications prior to visit.     Review of Systems   Objective:   There were no vitals taken for this visit.  Physical Exam   IAssessment and Plan   There are no diagnoses linked to this encounter.       Ova Freshwater, LPN [id number]

## 2016-11-03 DIAGNOSIS — N186 End stage renal disease: Secondary | ICD-10-CM | POA: Diagnosis not present

## 2016-11-03 DIAGNOSIS — D631 Anemia in chronic kidney disease: Secondary | ICD-10-CM | POA: Diagnosis not present

## 2016-11-03 DIAGNOSIS — Z992 Dependence on renal dialysis: Secondary | ICD-10-CM | POA: Diagnosis not present

## 2016-11-03 DIAGNOSIS — D509 Iron deficiency anemia, unspecified: Secondary | ICD-10-CM | POA: Diagnosis not present

## 2016-11-04 DIAGNOSIS — L739 Follicular disorder, unspecified: Secondary | ICD-10-CM | POA: Diagnosis not present

## 2016-11-04 DIAGNOSIS — D631 Anemia in chronic kidney disease: Secondary | ICD-10-CM | POA: Diagnosis not present

## 2016-11-04 DIAGNOSIS — L738 Other specified follicular disorders: Secondary | ICD-10-CM | POA: Diagnosis not present

## 2016-11-04 DIAGNOSIS — D229 Melanocytic nevi, unspecified: Secondary | ICD-10-CM | POA: Diagnosis not present

## 2016-11-04 DIAGNOSIS — D492 Neoplasm of unspecified behavior of bone, soft tissue, and skin: Secondary | ICD-10-CM | POA: Diagnosis not present

## 2016-11-04 DIAGNOSIS — D509 Iron deficiency anemia, unspecified: Secondary | ICD-10-CM | POA: Diagnosis not present

## 2016-11-04 DIAGNOSIS — N186 End stage renal disease: Secondary | ICD-10-CM | POA: Diagnosis not present

## 2016-11-04 DIAGNOSIS — Z992 Dependence on renal dialysis: Secondary | ICD-10-CM | POA: Diagnosis not present

## 2016-11-05 DIAGNOSIS — D509 Iron deficiency anemia, unspecified: Secondary | ICD-10-CM | POA: Diagnosis not present

## 2016-11-05 DIAGNOSIS — D631 Anemia in chronic kidney disease: Secondary | ICD-10-CM | POA: Diagnosis not present

## 2016-11-05 DIAGNOSIS — N186 End stage renal disease: Secondary | ICD-10-CM | POA: Diagnosis not present

## 2016-11-05 DIAGNOSIS — Z992 Dependence on renal dialysis: Secondary | ICD-10-CM | POA: Diagnosis not present

## 2016-11-06 DIAGNOSIS — D509 Iron deficiency anemia, unspecified: Secondary | ICD-10-CM | POA: Diagnosis not present

## 2016-11-06 DIAGNOSIS — Z992 Dependence on renal dialysis: Secondary | ICD-10-CM | POA: Diagnosis not present

## 2016-11-06 DIAGNOSIS — D631 Anemia in chronic kidney disease: Secondary | ICD-10-CM | POA: Diagnosis not present

## 2016-11-06 DIAGNOSIS — N186 End stage renal disease: Secondary | ICD-10-CM | POA: Diagnosis not present

## 2016-11-07 DIAGNOSIS — Z992 Dependence on renal dialysis: Secondary | ICD-10-CM | POA: Diagnosis not present

## 2016-11-07 DIAGNOSIS — D509 Iron deficiency anemia, unspecified: Secondary | ICD-10-CM | POA: Diagnosis not present

## 2016-11-07 DIAGNOSIS — N186 End stage renal disease: Secondary | ICD-10-CM | POA: Diagnosis not present

## 2016-11-07 DIAGNOSIS — D631 Anemia in chronic kidney disease: Secondary | ICD-10-CM | POA: Diagnosis not present

## 2016-11-08 ENCOUNTER — Encounter (HOSPITAL_COMMUNITY): Payer: Self-pay

## 2016-11-08 ENCOUNTER — Encounter (HOSPITAL_COMMUNITY): Admission: RE | Payer: Self-pay | Source: Ambulatory Visit

## 2016-11-08 ENCOUNTER — Encounter (HOSPITAL_COMMUNITY): Payer: Medicare Other | Attending: Oncology | Admitting: Oncology

## 2016-11-08 ENCOUNTER — Ambulatory Visit (HOSPITAL_COMMUNITY): Admission: RE | Admit: 2016-11-08 | Payer: Medicare Other | Source: Ambulatory Visit | Admitting: Vascular Surgery

## 2016-11-08 VITALS — BP 115/68 | HR 66 | Resp 16 | Ht 70.0 in | Wt 243.0 lb

## 2016-11-08 DIAGNOSIS — N186 End stage renal disease: Secondary | ICD-10-CM | POA: Diagnosis not present

## 2016-11-08 DIAGNOSIS — D631 Anemia in chronic kidney disease: Secondary | ICD-10-CM

## 2016-11-08 DIAGNOSIS — Z992 Dependence on renal dialysis: Secondary | ICD-10-CM | POA: Diagnosis not present

## 2016-11-08 DIAGNOSIS — C641 Malignant neoplasm of right kidney, except renal pelvis: Secondary | ICD-10-CM

## 2016-11-08 DIAGNOSIS — D509 Iron deficiency anemia, unspecified: Secondary | ICD-10-CM | POA: Diagnosis not present

## 2016-11-08 DIAGNOSIS — C649 Malignant neoplasm of unspecified kidney, except renal pelvis: Secondary | ICD-10-CM | POA: Insufficient documentation

## 2016-11-08 DIAGNOSIS — J90 Pleural effusion, not elsewhere classified: Secondary | ICD-10-CM

## 2016-11-08 SURGERY — ARTERIOVENOUS (AV) FISTULA CREATION
Anesthesia: Monitor Anesthesia Care | Site: Arm Lower | Laterality: Left

## 2016-11-08 NOTE — Progress Notes (Addendum)
[place] [facility] Initial Visit:  Patient Care Team: Caren Macadam, MD as PCP - General (Family Medicine) Fran Lowes, MD as Referring Physician (Nephrology) [place], [place]:  Stage III right renal cell carcinoma  HISTORY OF PRESENTING ILLNESS: Jesus [name] 66 y.o. male is here for evaluation of his stage III (T3N0) renal cell carcinoma. He was previously following at [facility] but wanted to transfer his care closer to home since he lives in [place].  He recently saw Dr. Alen Blew in oncology at [facility] in [place] on 10/25/16, however wanted to be even closer to home.  Per Dr. Hazeline Junker note:  "[name] is a 66 year old gentleman currently of [place], [place] where he lived the majority of his life. He is a gentleman without any significant comorbid conditions I was diagnosed with urothelial carcinoma in 2014. At that time he presented with hematuria and a CT scan showed a 3.8 x 6.3 x 6.0 cm right renal mass. He underwent a right nephrectomy, ureterectomy and retroperitoneal lymph node dissection done by Dr. Brendia Sacks in July 2014. At that time. 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 He subsequently received chemotherapy with cisplatin and gemcitabine for 4 cycles concluded in November 2015 under the care of Dr. Thera Flake at [facility]. He is subsequently developed to the left ureter tumors in April 2016 and required ablation. He hadn't recurrent tumor in October 2016 and required repeat ablation of these tumors and the ureter. 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 History of recurrent disease and subsequently underwent complete resection of his left kidney, ureter and bladder in February 2017. He is pathology showed a T3a urothelial carcinoma and the ureter and bladder  was 0 out of 9 lymph nodes involved. The procedure left him on dialysis and currently receiving peritoneal dialysis at home.  He started developing a recurrent pleural effusion in the last last year or so without any clear etiology. Imaging studies did not show any clear-cut malignancy and the lung although the pleural effusion might have obscured some of the lung parenchyma. His last thoracentesis was done on 10/06/2016 and prior to that it was in June 2018. His last CT scan was done in June 2018 which showed a focal consolidation in the right middle lobe adjacent to the pericardium measuring 3.2 x 2.3 cm. There is also increased right sided pleural effusion.  Based on these findings patient was supposed to have repeat pleural effusions and subsequently repeat imaging studies and evaluation by pulmonary medicine at [facility]. His appointment was set for November 2018. He wanted to establish care at the [facility] at this time."  Today patient states that he feels fairly well. He stated his only problem was that he was started on sertraline last week and took it for a few days, however it caused him to have worsening of his hypertension and constipation so he stopped taking it. He continues to do his own peritoneal dialysis at home but goes to the HD center here in [place] every 2 weeks, his next appointment is tomorrow. He has not had a right thoracentesis since early August at [facility]. He denies any orthopnea. He denies any shortness of breath or chest pain. He feels fatigued and was stating his his hemoglobin has been dropping down into the 9 g/dL range but denies  any bleeding including melena, hematochezia, hemoptysis. He denies any weight loss.     Review of Systems - Oncology ROS as per HPI otherwise 12 point ROS is negative.  MEDICAL HISTORY: Past Medical History:  Diagnosis Date  . Anemia   . Anxiety   . Arthritis   . Cancer [facility])    bladder, ureter, Bil  kidneys  . Dermatitis    scaly bump occurs every few months, pt uses cortizone cream and it goes away  . ESRD (end stage renal disease) on dialysis ([place])    Bilateral Nephrectomies- due to cancer  . History of blood transfusion   . Hypertension   . Lumbar back pain   . Mental disorder   . Pleural effusion, right    s/p right thoracentesis 10/07/15 (no malignancy by cytology report)  . Shortness of breath dyspnea    Lying down gets short of breath    SURGICAL HISTORY: Past Surgical History:  Procedure Laterality Date  . AV FISTULA PLACEMENT Right 06/05/2015   Procedure: ARTERIOVENOUS (AV) FISTULA CREATION RIGHT ARM;  Surgeon: Angelia Mould, MD;  Location: [place];  Service: Vascular;  Laterality: Right;  . BLADDER SURGERY  04-06-2015   removal of bladder/ [facility]   . CAPD INSERTION N/A 06/24/2016   Procedure: LAPAROSCOPIC INSERTION CONTINUOUS AMBULATORY PERITONEAL DIALYSIS  (CAPD) CATHETER;  Surgeon: Clovis Riley, MD;  Location: [place];  Service: General;  Laterality: N/A;  . CARPAL TUNNEL RELEASE     left hand x 2; right hand x 1; right elbow  . CERVICAL FUSION     x 2  . EYE SURGERY Bilateral    Cataract removal  . FISTULA SUPERFICIALIZATION Right 10/30/2015   Procedure: SUPERFICIALIZATION BRACHIOCEPHALIC ARTERIOVENOUS FISTULA Right Arm;  Surgeon: Angelia Mould, MD;  Location: [place];  Service: Vascular;  Laterality: Right;  . Hemocatheter    . IR GENERIC HISTORICAL Right 04/29/2016   IR THROMBECTOMY AV FISTULA W/THROMBOLYSIS/PTA INC/SHUNT/IMG RIGHT 04/29/2016 Arne Cleveland, MD [facility]-INTERV RAD  . IR GENERIC HISTORICAL  04/29/2016   IR US GUIDE VASC ACCESS RIGHT 04/29/2016 Arne Cleveland, MD [facility]-INTER[facility] RAD  . KNEE ARTHROSCOPY Bilateral    bilateral  . KNEE ARTHROSCOPY WITH MEDIAL MENISECTOMY Left 02/26/2013   Procedure: KNEE ARTHROSCOPY WITH MEDIAL MENISECTOMY;  Surgeon: Garald Balding, MD;  Location: [place];  Service: Orthopedics;  Laterality: Left;  .  LIGATION OF COMPETING BRANCHES OF ARTERIOVENOUS FISTULA Right 10/30/2015   Procedure: LIGATION OF COMPETING BRANCHES OF BRACHIOCEPHALIC ARTERIOVENOUS FISTULA;  Surgeon: Angelia Mould, MD;  Location: [place];  Service: Vascular;  Laterality: Right;  . NEPHRECTOMY Right September 12, 2012  . NEPHRECTOMY Left 04-06-2015   [place] Right 09/07/2015   Procedure: Fistulagram;  Surgeon: Angelia Mould, MD;  Location: [place] CV LAB;  Service: Cardiovascular;  Laterality: Right;  ARM  . PERIPHERAL VASCULAR CATHETERIZATION Right 09/07/2015   Procedure: Peripheral Vascular Balloon Angioplasty;  Surgeon: Angelia Mould, MD;  Location: [place] CV LAB;  Service: Cardiovascular;  Laterality: Right;  upper arm venous  . PROSTATECTOMY  04-06-2015   [place] Bilateral    bilateral  . TOTAL KNEE ARTHROPLASTY  01/24/2012   Procedure: TOTAL KNEE ARTHROPLASTY;  Surgeon: Garald Balding, MD;  Location: [place];  Service: Orthopedics;  Laterality: Right;  RIGHT TOTAL KNEE REPLACEMENT    SOCIAL HISTORY: Social History   Social History  .  Marital status: Widowed    Spouse name: N/A  . Number of children: N/A  . Years of education: N/A   Occupational History  . Not on file.   Social History Main Topics  . Smoking status: Never Smoker  . Smokeless tobacco: Never Used  . Alcohol use No  . Drug use: No  . Sexual activity: Not on file   Other Topics Concern  . Not on file   Social History Narrative   Lives alone. Manages well per patient report. Does not cook much. Eats out a lot. Eats all food groups. Does not eat much fruit. Married in the past. Has one son. Used to drive a truck.     FAMILY HISTORY Family History  Problem Relation Age of Onset  . Cancer Mother   . Hypertension Father   . Atrial fibrillation Father   . Heart disease Father     ALLERGIES:  has No Known  Allergies.  MEDICATIONS:  Current Outpatient Prescriptions  Medication Sig Dispense Refill  . ALPRAZolam (XANAX) 1 MG tablet Take 1 mg by mouth 2 (two) times daily as needed for anxiety.     . citalopram (CELEXA) 20 MG tablet Take 1 tablet (20 mg total) by mouth daily. 30 tablet 3  . clobetasol cream (TEMOVATE) 6.27 % Apply 1 application topically 2 (two) times daily.    . fluticasone (FLONASE) 50 MCG/ACT nasal spray Place 1 spray into both nostrils daily.     Marland Kitchen oxyCODONE-acetaminophen (PERCOCET/ROXICET) 5-325 MG tablet     . Polyethyl Glycol-Propyl Glycol (SYSTANE OP) Place 1 drop into both eyes daily as needed (for dry, itchy eyes).     No current facility-administered medications for this visit.     PHYSICAL EXAMINATION:  ECOG PERFORMANCE STATUS: 1 - Symptomatic but completely ambulatory   Vitals:   11/08/16 0836  BP: 115/68  Pulse: 66  Resp: 16  SpO2: 100%    Filed Weights   11/08/16 0836  Weight: 243 lb (110.2 kg)     Physical Exam Constitutional: Well-developed, well-nourished, and in no distress.   HENT:  Head: Normocephalic and atraumatic.  Mouth/Throat: No oropharyngeal exudate. Mucosa moist. Eyes: Pupils are equal, round, and reactive to light. Conjunctivae are normal. No scleral icterus.  Neck: Normal range of motion. Neck supple. No JVD present.  Cardiovascular: Normal rate, regular rhythm and normal heart sounds.  Exam reveals no gallop and no friction rub.   No murmur heard. Pulmonary/Chest: Effort normal and breath sounds are normal in all lung fields except in the right lung base where it is decreased. No respiratory distress. No wheezes.No rales.  Abdominal: Soft. Bowel sounds are normal. No distension. There is no tenderness. There is no guarding. +peritoneal dialysis catheter in place with clean bandage over it.  Musculoskeletal: No edema or tenderness.  Lymphadenopathy:    No cervical or supraclavicular adenopathy.  Neurological: Alert and oriented  to person, place, and time. No cranial nerve deficit.  Skin: Skin is warm and dry. No rash noted. No erythema. No pallor.  Psychiatric: Affect and judgment normal.    LABORATORY DATA: I have personally reviewed the data as listed:  No visits with results within 1 Month(s) from this visit.  Latest known visit with results is:  Admission on 06/24/2016, Discharged on 06/24/2016  Component Date Value Ref Range Status  . aPTT 06/24/2016 38* 24 - 36 seconds Final   Comment:        IF BASELINE aPTT IS ELEVATED, SUGGEST PATIENT RISK  ASSESSMENT BE USED TO DETERMINE APPROPRIATE ANTICOAGULANT THERAPY.   . WBC 06/24/2016 7.4  4.0 - 10.5 K/uL Final  . RBC 06/24/2016 3.31* 4.22 - 5.81 MIL/uL Final  . Hemoglobin 06/24/2016 11.6* 13.0 - 17.0 g/dL Final  . HCT 06/24/2016 33.8* 39.0 - 52.0 % Final  . MCV 06/24/2016 102.1* 78.0 - 100.0 fL Final  . MCH 06/24/2016 35.0* 26.0 - 34.0 pg Final  . MCHC 06/24/2016 34.3  30.0 - 36.0 g/dL Final  . RDW 06/24/2016 13.7  11.5 - 15.5 % Final  . Platelets 06/24/2016 178  150 - 400 K/uL Final  . Neutrophils Relative % 06/24/2016 71  % Final  . Neutro Abs 06/24/2016 5.3  1.7 - 7.7 K/uL Final  . Lymphocytes Relative 06/24/2016 16  % Final  . Lymphs Abs 06/24/2016 1.2  0.7 - 4.0 K/uL Final  . Monocytes Relative 06/24/2016 9  % Final  . Monocytes Absolute 06/24/2016 0.7  0.1 - 1.0 K/uL Final  . Eosinophils Relative 06/24/2016 4  % Final  . Eosinophils Absolute 06/24/2016 0.3  0.0 - 0.7 K/uL Final  . Basophils Relative 06/24/2016 0  % Final  . Basophils Absolute 06/24/2016 0.0  0.0 - 0.1 K/uL Final  . Sodium 06/24/2016 134* 135 - 145 mmol/L Final  . Potassium 06/24/2016 4.9  3.5 - 5.1 mmol/L Final  . Chloride 06/24/2016 96* 101 - 111 mmol/L Final  . CO2 06/24/2016 27  22 - 32 mmol/L Final  . Glucose, Bld 06/24/2016 83  65 - 99 mg/dL Final  . BUN 06/24/2016 32* 6 - 20 mg/dL Final  . Creatinine, Ser 06/24/2016 8.94* 0.61 - 1.24 mg/dL Final  . Calcium 06/24/2016  8.5* 8.9 - 10.3 mg/dL Final  . Total Protein 06/24/2016 5.9* 6.5 - 8.1 g/dL Final  . Albumin 06/24/2016 3.3* 3.5 - 5.0 g/dL Final  . AST 06/24/2016 12* 15 - 41 U/L Final  . ALT 06/24/2016 10* 17 - 63 U/L Final  . Alkaline Phosphatase 06/24/2016 63  38 - 126 U/L Final  . Total Bilirubin 06/24/2016 0.6  0.3 - 1.2 mg/dL Final  . GFR calc non Af Amer 06/24/2016 5* >60 mL/min Final  . GFR calc Af Amer 06/24/2016 6* >60 mL/min Final   Comment: (NOTE) The eGFR has been calculated using the CKD EPI equation. This calculation has not been validated in all clinical situations. eGFR's persistently <60 mL/min signify possible Chronic Kidney Disease.   . Anion gap 06/24/2016 11  5 - 15 Final  . Prothrombin Time 06/24/2016 14.0  11.4 - 15.2 seconds Final  . INR 06/24/2016 1.07   Final    RADIOGRAPHIC STUDIES: I have personally reviewed the radiological images as listed and agree with the findings in the report  No results found.  ASSESSMENT/PLAN 1. Urothelial carcinoma A. CT to evaluate hematuria revealed a 3.8 x 6.3 x 6.0 cm right renal mass B. Right nephrectomy, ureterectomy, and retroperitoneal lymph node dissection with Dr. Brendia Sacks on 09/12/2012. Pathology revealed high-grade papillary urothelial carcinoma pT3N0. C. Initiated chemotherapy with every 3 week cisplatin 35 mg/m2 and gemcitabine 1000 mg/m2 on days 1 & 8 on 10/21/2013 and completed 4 cycles on 12/30/2013. D. S/p ablation of 2 left ureter papillary tumors in April 2016.  E. S/p ablation of multiple ureteral and collecting system tumors in July 2016 and again in October 2016, with residual disease remaining. F. On basis of FGFR mutation, Pazopanib 800 mg daily started 01/18/2015, dose-reduced to 675m daily on 02/02/2015, stopped 03/10/2015 for LFT  abnormalities and upcoming surgery. G. 04/06/2015 completion GU-ectomy with Ta urothelial carcinoma in ureter and bladder, 0/9 lymph nodes  -Clinically NED.  -Repeat restaging scans in the first  week of October.  2. Recurrent right pleural effusion s/p multiple thoracentesis with no malignant cells on cytology.  -Patient is transferring all of his care from [facility] to [place]. He has an upcoming appointment with pulmonary in [place] on 12/02/16 for evaluation and treatment. Unclear what the underlying etiology of his recurrent pleural effusions.   -We will repeat his CT chest prior to his visit with them.  3. ESRD on home peritoneal dialysis.  -Continue home PD. Defer to nephrology for management.  4. Anemia in the setting of ESRD.  -Per his previous [facility] oncologist notes, the patient becomes symptomatic when hgb <8.5. In setting of ESRD and curative-intent cancer surgery, would limit ESAs and would transfuse as needed to keep hgb > 8.5. We can help to coordinate transfusions as needed.  RTC in 3 months for follow up. Labs and restaging CT C/A/P in the first week of October.  Orders Placed This Encounter  Procedures  . CT Abdomen Pelvis W Contrast    Standing Status:   Future    Standing Expiration Date:   11/08/2017    Order Specific Question:   If indicated for the ordered procedure, I authorize the administration of contrast media per Radiology protocol    Answer:   Yes    Order Specific Question:   Preferred imaging location?    Answer:   [facility]    Order Specific Question:   Radiology Contrast Protocol - do NOT remove file path    Answer:   [email or web address]  . CT Chest W Contrast    Standing Status:   Future    Standing Expiration Date:   11/08/2017    Order Specific Question:   If indicated for the ordered procedure, I authorize the administration of contrast media per Radiology protocol    Answer:   Yes    Order Specific Question:   Preferred imaging location?    Answer:   [facility]    Order Specific Question:   Radiology Contrast Protocol - do NOT remove file path    Answer:    [email or web address]    Order Specific Question:   Reason for Exam additional comments    Answer:   evaluate for right pleural effusion  . CBC with Differential    Standing Status:   Future    Standing Expiration Date:   11/08/2017  . Comprehensive metabolic panel    Standing Status:   Future    Standing Expiration Date:   11/08/2017  . Ambulatory referral to Social Work    Referral Priority:   Routine    Referral Type:   Consultation    Referral Reason:   Specialty Services Required    Number of Visits Requested:   1    All questions were answered. The patient knows to call the clinic with any problems, questions or concerns.  This note was electronically signed.    Twana First, MD  11/08/2016 9:00 AM

## 2016-11-09 DIAGNOSIS — Z992 Dependence on renal dialysis: Secondary | ICD-10-CM | POA: Diagnosis not present

## 2016-11-09 DIAGNOSIS — D509 Iron deficiency anemia, unspecified: Secondary | ICD-10-CM | POA: Diagnosis not present

## 2016-11-09 DIAGNOSIS — D631 Anemia in chronic kidney disease: Secondary | ICD-10-CM | POA: Diagnosis not present

## 2016-11-09 DIAGNOSIS — N186 End stage renal disease: Secondary | ICD-10-CM | POA: Diagnosis not present

## 2016-11-10 DIAGNOSIS — D509 Iron deficiency anemia, unspecified: Secondary | ICD-10-CM | POA: Diagnosis not present

## 2016-11-10 DIAGNOSIS — N186 End stage renal disease: Secondary | ICD-10-CM | POA: Diagnosis not present

## 2016-11-10 DIAGNOSIS — D631 Anemia in chronic kidney disease: Secondary | ICD-10-CM | POA: Diagnosis not present

## 2016-11-10 DIAGNOSIS — Z992 Dependence on renal dialysis: Secondary | ICD-10-CM | POA: Diagnosis not present

## 2016-11-11 DIAGNOSIS — N186 End stage renal disease: Secondary | ICD-10-CM | POA: Diagnosis not present

## 2016-11-11 DIAGNOSIS — Z992 Dependence on renal dialysis: Secondary | ICD-10-CM | POA: Diagnosis not present

## 2016-11-11 DIAGNOSIS — D509 Iron deficiency anemia, unspecified: Secondary | ICD-10-CM | POA: Diagnosis not present

## 2016-11-11 DIAGNOSIS — D631 Anemia in chronic kidney disease: Secondary | ICD-10-CM | POA: Diagnosis not present

## 2016-11-12 DIAGNOSIS — N186 End stage renal disease: Secondary | ICD-10-CM | POA: Diagnosis not present

## 2016-11-12 DIAGNOSIS — D509 Iron deficiency anemia, unspecified: Secondary | ICD-10-CM | POA: Diagnosis not present

## 2016-11-12 DIAGNOSIS — D631 Anemia in chronic kidney disease: Secondary | ICD-10-CM | POA: Diagnosis not present

## 2016-11-12 DIAGNOSIS — Z992 Dependence on renal dialysis: Secondary | ICD-10-CM | POA: Diagnosis not present

## 2016-11-13 DIAGNOSIS — D509 Iron deficiency anemia, unspecified: Secondary | ICD-10-CM | POA: Diagnosis not present

## 2016-11-13 DIAGNOSIS — Z992 Dependence on renal dialysis: Secondary | ICD-10-CM | POA: Diagnosis not present

## 2016-11-13 DIAGNOSIS — D631 Anemia in chronic kidney disease: Secondary | ICD-10-CM | POA: Diagnosis not present

## 2016-11-13 DIAGNOSIS — N186 End stage renal disease: Secondary | ICD-10-CM | POA: Diagnosis not present

## 2016-11-14 DIAGNOSIS — D509 Iron deficiency anemia, unspecified: Secondary | ICD-10-CM | POA: Diagnosis not present

## 2016-11-14 DIAGNOSIS — Z992 Dependence on renal dialysis: Secondary | ICD-10-CM | POA: Diagnosis not present

## 2016-11-14 DIAGNOSIS — N186 End stage renal disease: Secondary | ICD-10-CM | POA: Diagnosis not present

## 2016-11-14 DIAGNOSIS — D631 Anemia in chronic kidney disease: Secondary | ICD-10-CM | POA: Diagnosis not present

## 2016-11-15 DIAGNOSIS — Z992 Dependence on renal dialysis: Secondary | ICD-10-CM | POA: Diagnosis not present

## 2016-11-15 DIAGNOSIS — I10 Essential (primary) hypertension: Secondary | ICD-10-CM | POA: Diagnosis not present

## 2016-11-15 DIAGNOSIS — D631 Anemia in chronic kidney disease: Secondary | ICD-10-CM | POA: Diagnosis not present

## 2016-11-15 DIAGNOSIS — Z85528 Personal history of other malignant neoplasm of kidney: Secondary | ICD-10-CM | POA: Diagnosis not present

## 2016-11-15 DIAGNOSIS — D509 Iron deficiency anemia, unspecified: Secondary | ICD-10-CM | POA: Diagnosis not present

## 2016-11-15 DIAGNOSIS — N186 End stage renal disease: Secondary | ICD-10-CM | POA: Diagnosis not present

## 2016-11-15 DIAGNOSIS — J90 Pleural effusion, not elsewhere classified: Secondary | ICD-10-CM | POA: Diagnosis not present

## 2016-11-16 DIAGNOSIS — D631 Anemia in chronic kidney disease: Secondary | ICD-10-CM | POA: Diagnosis not present

## 2016-11-16 DIAGNOSIS — Z992 Dependence on renal dialysis: Secondary | ICD-10-CM | POA: Diagnosis not present

## 2016-11-16 DIAGNOSIS — D509 Iron deficiency anemia, unspecified: Secondary | ICD-10-CM | POA: Diagnosis not present

## 2016-11-16 DIAGNOSIS — N186 End stage renal disease: Secondary | ICD-10-CM | POA: Diagnosis not present

## 2016-11-16 NOTE — Progress Notes (Deleted)
Patient ID: Jesus [name], male    DOB: 1950-11-29, 66 y.o.   MRN: [id number]  No chief complaint on file.   Allergies Patient has no known allergies.  Subjective:   Jesus [name] is a 66 y.o. male who presents to [facility] today.  HPI HPI  Past Medical History:  Diagnosis Date  . Anemia   . Anxiety   . Arthritis   . Cancer [facility])    bladder, ureter, Bil kidneys  . Dermatitis    scaly bump occurs every few months, pt uses cortizone cream and it goes away  . ESRD (end stage renal disease) on dialysis ([place])    Bilateral Nephrectomies- due to cancer  . History of blood transfusion   . Hypertension   . Lumbar back pain   . Mental disorder   . Pleural effusion, right    s/p right thoracentesis 10/07/15 (no malignancy by cytology report)  . Shortness of breath dyspnea    Lying down gets short of breath    Past Surgical History:  Procedure Laterality Date  . AV FISTULA PLACEMENT Right 06/05/2015   Procedure: ARTERIOVENOUS (AV) FISTULA CREATION RIGHT ARM;  Surgeon: Angelia Mould, MD;  Location: [place];  Service: Vascular;  Laterality: Right;  . BLADDER SURGERY  04-06-2015   removal of bladder/ [facility]   . CAPD INSERTION N/A 06/24/2016   Procedure: LAPAROSCOPIC INSERTION CONTINUOUS AMBULATORY PERITONEAL DIALYSIS  (CAPD) CATHETER;  Surgeon: Clovis Riley, MD;  Location: [place];  Service: General;  Laterality: N/A;  . CARPAL TUNNEL RELEASE     left hand x 2; right hand x 1; right elbow  . CERVICAL FUSION     x 2  . EYE SURGERY Bilateral    Cataract removal  . FISTULA SUPERFICIALIZATION Right 10/30/2015   Procedure: SUPERFICIALIZATION BRACHIOCEPHALIC ARTERIOVENOUS FISTULA Right Arm;  Surgeon: Angelia Mould, MD;  Location: [place];  Service: Vascular;  Laterality: Right;  . Hemocatheter    . IR GENERIC HISTORICAL Right 04/29/2016   IR THROMBECTOMY AV FISTULA W/THROMBOLYSIS/PTA INC/SHUNT/IMG RIGHT 04/29/2016 Arne Cleveland, MD [facility]-INTERV  RAD  . IR GENERIC HISTORICAL  04/29/2016   IR US GUIDE VASC ACCESS RIGHT 04/29/2016 Arne Cleveland, MD [facility]INTER[facility] RAD  . KNEE ARTHROSCOPY Bilateral    bilateral  . KNEE ARTHROSCOPY WITH MEDIAL MENISECTOMY Left 02/26/2013   Procedure: KNEE ARTHROSCOPY WITH MEDIAL MENISECTOMY;  Surgeon: Garald Balding, MD;  Location: [place];  Service: Orthopedics;  Laterality: Left;  . LIGATION OF COMPETING BRANCHES OF ARTERIOVENOUS FISTULA Right 10/30/2015   Procedure: LIGATION OF COMPETING BRANCHES OF BRACHIOCEPHALIC ARTERIOVENOUS FISTULA;  Surgeon: Angelia Mould, MD;  Location: [place];  Service: Vascular;  Laterality: Right;  . NEPHRECTOMY Right September 12, 2012  . NEPHRECTOMY Left 04-06-2015   [place] Right 09/07/2015   Procedure: Fistulagram;  Surgeon: Angelia Mould, MD;  Location: [place] CV LAB;  Service: Cardiovascular;  Laterality: Right;  ARM  . PERIPHERAL VASCULAR CATHETERIZATION Right 09/07/2015   Procedure: Peripheral Vascular Balloon Angioplasty;  Surgeon: Angelia Mould, MD;  Location: [place] CV LAB;  Service: Cardiovascular;  Laterality: Right;  upper arm venous  . PROSTATECTOMY  04-06-2015   [place] Bilateral    bilateral  . TOTAL KNEE ARTHROPLASTY  01/24/2012   Procedure: TOTAL KNEE ARTHROPLASTY;  Surgeon: Garald Balding, MD;  Location: [place];  Service: Orthopedics;  Laterality: Right;  RIGHT TOTAL KNEE REPLACEMENT    Family History  Problem Relation Age of Onset  . Cancer Mother   . Hypertension Father   . Atrial fibrillation Father   . Heart disease Father      Social History   Social History  . Marital status: Widowed    Spouse name: N/A  . Number of children: N/A  . Years of education: N/A   Social History Main Topics  . Smoking status: Never Smoker  . Smokeless tobacco: Never Used  . Alcohol use No  . Drug use: No  . Sexual activity: Not on  file   Other Topics Concern  . Not on file   Social History Narrative   Lives alone. Manages well per patient report. Does not cook much. Eats out a lot. Eats all food groups. Does not eat much fruit. Married in the past. Has one son. Used to drive a truck.     Review of Systems   Objective:   There were no vitals taken for this visit.  Physical Exam   Assessment and Plan   There are no diagnoses linked to this encounter.   No Follow-up on file. Amado Coe, [place] 11/16/2016

## 2016-11-17 DIAGNOSIS — D631 Anemia in chronic kidney disease: Secondary | ICD-10-CM | POA: Diagnosis not present

## 2016-11-17 DIAGNOSIS — D509 Iron deficiency anemia, unspecified: Secondary | ICD-10-CM | POA: Diagnosis not present

## 2016-11-17 DIAGNOSIS — N186 End stage renal disease: Secondary | ICD-10-CM | POA: Diagnosis not present

## 2016-11-17 DIAGNOSIS — Z992 Dependence on renal dialysis: Secondary | ICD-10-CM | POA: Diagnosis not present

## 2016-11-18 DIAGNOSIS — Z992 Dependence on renal dialysis: Secondary | ICD-10-CM | POA: Diagnosis not present

## 2016-11-18 DIAGNOSIS — N186 End stage renal disease: Secondary | ICD-10-CM | POA: Diagnosis not present

## 2016-11-18 DIAGNOSIS — D631 Anemia in chronic kidney disease: Secondary | ICD-10-CM | POA: Diagnosis not present

## 2016-11-18 DIAGNOSIS — D509 Iron deficiency anemia, unspecified: Secondary | ICD-10-CM | POA: Diagnosis not present

## 2016-11-19 DIAGNOSIS — N186 End stage renal disease: Secondary | ICD-10-CM | POA: Diagnosis not present

## 2016-11-19 DIAGNOSIS — D631 Anemia in chronic kidney disease: Secondary | ICD-10-CM | POA: Diagnosis not present

## 2016-11-19 DIAGNOSIS — Z992 Dependence on renal dialysis: Secondary | ICD-10-CM | POA: Diagnosis not present

## 2016-11-19 DIAGNOSIS — D509 Iron deficiency anemia, unspecified: Secondary | ICD-10-CM | POA: Diagnosis not present

## 2016-11-20 DIAGNOSIS — D631 Anemia in chronic kidney disease: Secondary | ICD-10-CM | POA: Diagnosis not present

## 2016-11-20 DIAGNOSIS — D509 Iron deficiency anemia, unspecified: Secondary | ICD-10-CM | POA: Diagnosis not present

## 2016-11-20 DIAGNOSIS — N186 End stage renal disease: Secondary | ICD-10-CM | POA: Diagnosis not present

## 2016-11-20 DIAGNOSIS — Z992 Dependence on renal dialysis: Secondary | ICD-10-CM | POA: Diagnosis not present

## 2016-11-21 ENCOUNTER — Ambulatory Visit: Payer: Medicare Other | Admitting: Oncology

## 2016-11-21 DIAGNOSIS — Z992 Dependence on renal dialysis: Secondary | ICD-10-CM | POA: Diagnosis not present

## 2016-11-21 DIAGNOSIS — D509 Iron deficiency anemia, unspecified: Secondary | ICD-10-CM | POA: Diagnosis not present

## 2016-11-21 DIAGNOSIS — D631 Anemia in chronic kidney disease: Secondary | ICD-10-CM | POA: Diagnosis not present

## 2016-11-21 DIAGNOSIS — N186 End stage renal disease: Secondary | ICD-10-CM | POA: Diagnosis not present

## 2016-11-22 DIAGNOSIS — D509 Iron deficiency anemia, unspecified: Secondary | ICD-10-CM | POA: Diagnosis not present

## 2016-11-22 DIAGNOSIS — D631 Anemia in chronic kidney disease: Secondary | ICD-10-CM | POA: Diagnosis not present

## 2016-11-22 DIAGNOSIS — Z992 Dependence on renal dialysis: Secondary | ICD-10-CM | POA: Diagnosis not present

## 2016-11-22 DIAGNOSIS — N186 End stage renal disease: Secondary | ICD-10-CM | POA: Diagnosis not present

## 2016-11-23 ENCOUNTER — Ambulatory Visit: Payer: Medicare Other | Admitting: Family Medicine

## 2016-11-23 DIAGNOSIS — D631 Anemia in chronic kidney disease: Secondary | ICD-10-CM | POA: Diagnosis not present

## 2016-11-23 DIAGNOSIS — Z992 Dependence on renal dialysis: Secondary | ICD-10-CM | POA: Diagnosis not present

## 2016-11-23 DIAGNOSIS — N186 End stage renal disease: Secondary | ICD-10-CM | POA: Diagnosis not present

## 2016-11-23 DIAGNOSIS — D509 Iron deficiency anemia, unspecified: Secondary | ICD-10-CM | POA: Diagnosis not present

## 2016-11-24 DIAGNOSIS — Z992 Dependence on renal dialysis: Secondary | ICD-10-CM | POA: Diagnosis not present

## 2016-11-24 DIAGNOSIS — N186 End stage renal disease: Secondary | ICD-10-CM | POA: Diagnosis not present

## 2016-11-24 DIAGNOSIS — D631 Anemia in chronic kidney disease: Secondary | ICD-10-CM | POA: Diagnosis not present

## 2016-11-24 DIAGNOSIS — D509 Iron deficiency anemia, unspecified: Secondary | ICD-10-CM | POA: Diagnosis not present

## 2016-11-25 DIAGNOSIS — Z992 Dependence on renal dialysis: Secondary | ICD-10-CM | POA: Diagnosis not present

## 2016-11-25 DIAGNOSIS — N186 End stage renal disease: Secondary | ICD-10-CM | POA: Diagnosis not present

## 2016-11-25 DIAGNOSIS — D631 Anemia in chronic kidney disease: Secondary | ICD-10-CM | POA: Diagnosis not present

## 2016-11-25 DIAGNOSIS — D509 Iron deficiency anemia, unspecified: Secondary | ICD-10-CM | POA: Diagnosis not present

## 2016-11-26 DIAGNOSIS — D631 Anemia in chronic kidney disease: Secondary | ICD-10-CM | POA: Diagnosis not present

## 2016-11-26 DIAGNOSIS — D509 Iron deficiency anemia, unspecified: Secondary | ICD-10-CM | POA: Diagnosis not present

## 2016-11-26 DIAGNOSIS — N186 End stage renal disease: Secondary | ICD-10-CM | POA: Diagnosis not present

## 2016-11-26 DIAGNOSIS — Z992 Dependence on renal dialysis: Secondary | ICD-10-CM | POA: Diagnosis not present

## 2016-11-27 DIAGNOSIS — N186 End stage renal disease: Secondary | ICD-10-CM | POA: Diagnosis not present

## 2016-11-27 DIAGNOSIS — D631 Anemia in chronic kidney disease: Secondary | ICD-10-CM | POA: Diagnosis not present

## 2016-11-27 DIAGNOSIS — Z992 Dependence on renal dialysis: Secondary | ICD-10-CM | POA: Diagnosis not present

## 2016-11-27 DIAGNOSIS — D509 Iron deficiency anemia, unspecified: Secondary | ICD-10-CM | POA: Diagnosis not present

## 2016-11-28 ENCOUNTER — Ambulatory Visit (HOSPITAL_COMMUNITY)
Admission: RE | Admit: 2016-11-28 | Discharge: 2016-11-28 | Disposition: A | Discharge status: Home / Self Care | Payer: Medicare Other | Source: Ambulatory Visit | Attending: Oncology | Admitting: Oncology

## 2016-11-28 ENCOUNTER — Encounter (HOSPITAL_COMMUNITY): Payer: Medicare Other | Attending: Oncology

## 2016-11-28 DIAGNOSIS — D631 Anemia in chronic kidney disease: Secondary | ICD-10-CM | POA: Diagnosis not present

## 2016-11-28 DIAGNOSIS — J90 Pleural effusion, not elsewhere classified: Secondary | ICD-10-CM | POA: Insufficient documentation

## 2016-11-28 DIAGNOSIS — K4090 Unilateral inguinal hernia, without obstruction or gangrene, not specified as recurrent: Secondary | ICD-10-CM | POA: Insufficient documentation

## 2016-11-28 DIAGNOSIS — C649 Malignant neoplasm of unspecified kidney, except renal pelvis: Secondary | ICD-10-CM | POA: Diagnosis not present

## 2016-11-28 DIAGNOSIS — N186 End stage renal disease: Secondary | ICD-10-CM | POA: Diagnosis not present

## 2016-11-28 DIAGNOSIS — Z08 Encounter for follow-up examination after completed treatment for malignant neoplasm: Secondary | ICD-10-CM | POA: Diagnosis not present

## 2016-11-28 DIAGNOSIS — Z85528 Personal history of other malignant neoplasm of kidney: Secondary | ICD-10-CM | POA: Diagnosis not present

## 2016-11-28 DIAGNOSIS — Z9889 Other specified postprocedural states: Secondary | ICD-10-CM | POA: Diagnosis not present

## 2016-11-28 DIAGNOSIS — D509 Iron deficiency anemia, unspecified: Secondary | ICD-10-CM | POA: Diagnosis not present

## 2016-11-28 DIAGNOSIS — Z992 Dependence on renal dialysis: Secondary | ICD-10-CM | POA: Diagnosis not present

## 2016-11-28 LAB — CBC WITH DIFFERENTIAL/PLATELET
BASOS PCT: 0 %
Basophils Absolute: 0 10*3/uL (ref 0.0–0.1)
Eosinophils Absolute: 0.7 10*3/uL (ref 0.0–0.7)
Eosinophils Relative: 7 %
HEMATOCRIT: 25.1 % — AB (ref 39.0–52.0)
Hemoglobin: 9.1 g/dL — ABNORMAL LOW (ref 13.0–17.0)
Lymphocytes Relative: 8 %
Lymphs Abs: 0.8 10*3/uL (ref 0.7–4.0)
MCH: 35.1 pg — ABNORMAL HIGH (ref 26.0–34.0)
MCHC: 36.3 g/dL — AB (ref 30.0–36.0)
MCV: 96.9 fL (ref 78.0–100.0)
MONO ABS: 1.2 10*3/uL — AB (ref 0.1–1.0)
Monocytes Relative: 12 %
NEUTROS ABS: 7.1 10*3/uL (ref 1.7–7.7)
Neutrophils Relative %: 73 %
Platelets: 224 10*3/uL (ref 150–400)
RBC: 2.59 MIL/uL — ABNORMAL LOW (ref 4.22–5.81)
RDW: 11.7 % (ref 11.5–15.5)
WBC: 9.8 10*3/uL (ref 4.0–10.5)

## 2016-11-28 LAB — COMPREHENSIVE METABOLIC PANEL
ALBUMIN: 2.9 g/dL — AB (ref 3.5–5.0)
ALT: 18 U/L (ref 17–63)
AST: 26 U/L (ref 15–41)
Alkaline Phosphatase: 72 U/L (ref 38–126)
Anion gap: 12 (ref 5–15)
BILIRUBIN TOTAL: 0.6 mg/dL (ref 0.3–1.2)
BUN: 62 mg/dL — AB (ref 6–20)
CO2: 27 mmol/L (ref 22–32)
CREATININE: 15.59 mg/dL — AB (ref 0.61–1.24)
Calcium: 8.6 mg/dL — ABNORMAL LOW (ref 8.9–10.3)
Chloride: 84 mmol/L — ABNORMAL LOW (ref 101–111)
GFR calc Af Amer: 3 mL/min — ABNORMAL LOW (ref >60)
GFR, EST NON AFRICAN AMERICAN: 3 mL/min — AB (ref >60)
GLUCOSE: 105 mg/dL — AB (ref 65–99)
POTASSIUM: 4.7 mmol/L (ref 3.5–5.1)
Sodium: 123 mmol/L — ABNORMAL LOW (ref 135–145)
Total Protein: 6.1 g/dL — ABNORMAL LOW (ref 6.5–8.1)

## 2016-11-28 MED ORDER — IOPAMIDOL (ISOVUE-300) INJECTION 61%
100.0000 mL | Freq: Once | INTRAVENOUS | Status: AC | PRN
  Administered 2016-11-28: 100 mL via INTRAVENOUS

## 2016-11-29 ENCOUNTER — Other Ambulatory Visit (HOSPITAL_COMMUNITY): Payer: Self-pay | Admitting: Oncology

## 2016-11-29 ENCOUNTER — Ambulatory Visit
Admission: RE | Admit: 2016-11-29 | Discharge: 2016-11-29 | Disposition: A | Discharge status: Home / Self Care | Payer: Self-pay | Source: Ambulatory Visit | Attending: Oncology | Admitting: Oncology

## 2016-11-29 DIAGNOSIS — D631 Anemia in chronic kidney disease: Secondary | ICD-10-CM | POA: Diagnosis not present

## 2016-11-29 DIAGNOSIS — C649 Malignant neoplasm of unspecified kidney, except renal pelvis: Secondary | ICD-10-CM

## 2016-11-29 DIAGNOSIS — J90 Pleural effusion, not elsewhere classified: Secondary | ICD-10-CM

## 2016-11-29 DIAGNOSIS — D509 Iron deficiency anemia, unspecified: Secondary | ICD-10-CM | POA: Diagnosis not present

## 2016-11-29 DIAGNOSIS — N186 End stage renal disease: Secondary | ICD-10-CM | POA: Diagnosis not present

## 2016-11-29 DIAGNOSIS — Z992 Dependence on renal dialysis: Secondary | ICD-10-CM | POA: Diagnosis not present

## 2016-11-30 ENCOUNTER — Encounter: Payer: Medicare Other | Admitting: Thoracic Surgery (Cardiothoracic Vascular Surgery)

## 2016-11-30 DIAGNOSIS — N186 End stage renal disease: Secondary | ICD-10-CM | POA: Diagnosis not present

## 2016-11-30 DIAGNOSIS — D631 Anemia in chronic kidney disease: Secondary | ICD-10-CM | POA: Diagnosis not present

## 2016-11-30 DIAGNOSIS — D509 Iron deficiency anemia, unspecified: Secondary | ICD-10-CM | POA: Diagnosis not present

## 2016-11-30 DIAGNOSIS — Z992 Dependence on renal dialysis: Secondary | ICD-10-CM | POA: Diagnosis not present

## 2016-12-01 ENCOUNTER — Institutional Professional Consult (permissible substitution) (INDEPENDENT_AMBULATORY_CARE_PROVIDER_SITE_OTHER): Payer: Medicare Other | Admitting: Thoracic Surgery (Cardiothoracic Vascular Surgery)

## 2016-12-01 ENCOUNTER — Encounter: Payer: Self-pay | Admitting: Thoracic Surgery (Cardiothoracic Vascular Surgery)

## 2016-12-01 VITALS — BP 148/79 | HR 64 | Resp 16 | Ht 70.0 in | Wt 240.0 lb

## 2016-12-01 DIAGNOSIS — Z992 Dependence on renal dialysis: Secondary | ICD-10-CM

## 2016-12-01 DIAGNOSIS — Z85528 Personal history of other malignant neoplasm of kidney: Secondary | ICD-10-CM | POA: Diagnosis not present

## 2016-12-01 DIAGNOSIS — D631 Anemia in chronic kidney disease: Secondary | ICD-10-CM | POA: Diagnosis not present

## 2016-12-01 DIAGNOSIS — J90 Pleural effusion, not elsewhere classified: Secondary | ICD-10-CM | POA: Diagnosis not present

## 2016-12-01 DIAGNOSIS — N186 End stage renal disease: Secondary | ICD-10-CM | POA: Diagnosis not present

## 2016-12-01 DIAGNOSIS — D509 Iron deficiency anemia, unspecified: Secondary | ICD-10-CM | POA: Diagnosis not present

## 2016-12-01 NOTE — Progress Notes (Signed)
PCP is Caren Macadam, MD Referring Provider is Caren Macadam, MD/ Velvet Bathe, MD  Chief Complaint  Patient presents with  . Pleural Effusion    chronic RIGHT...CT C/A/P 11/28/16...h/o renal cell ca/bilateral nephrectomies/peritoneal dialysis    HPI:  Mr. Yale Is sent for consultation regarding a chronic recurring right pleural effusion.  Mr. Doreene Nest is a 66 year old gentleman with a past history significant for renal cell carcinoma bilateral nephrectomies, the most recent was a left nephrectomy with resection of the ureter bladder and prostate and February 2017. He also has a past history of end-stage renal disease on peritoneal dialysis, hypertension, anemia, anxiety, arthritis, chronic back pain and dermatitis. He first was found to have a right effusion in April 2017. He had a thoracentesis in August 2017. He says they drained 1.5 L. He had a repeat thoracentesis in February, June, and August of this year. Each time draining about the same amount of fluid. He says that nobody has told him anything about why the fluid is there and why it continues to come back.  He has shortness of breath both with exertion and at rest. He does have orthopnea. He also complains of decreased energy and dizzy spells. He has anxiety. He is a lifelong nonsmoker. He has not had any change in appetite or weight loss.  Zubrod Score: At the time of surgery this patient's most appropriate activity status/level should be described as: []     0    Normal activity, no symptoms [x]     1    Restricted in physical strenuous activity but ambulatory, able to do out light work []     2    Ambulatory and capable of self care, unable to do work activities, up and about >50 % of waking hours                              []     3    Only limited self care, in bed greater than 50% of waking hours []     4    Completely disabled, no self care, confined to bed or chair []     5    Moribund  Past Medical History:  Diagnosis Date  .  Anemia   . Anxiety   . Arthritis   . Cancer [facility])    bladder, ureter, Bil kidneys  . Dermatitis    scaly bump occurs every few months, pt uses cortizone cream and it goes away  . ESRD (end stage renal disease) on dialysis ([place])    Bilateral Nephrectomies- due to cancer  . History of blood transfusion   . Hypertension   . Lumbar back pain   . Mental disorder   . Pleural effusion, right    s/p right thoracentesis 10/07/15 (no malignancy by cytology report)  . Shortness of breath dyspnea    Lying down gets short of breath    Past Surgical History:  Procedure Laterality Date  . AV FISTULA PLACEMENT Right 06/05/2015   Procedure: ARTERIOVENOUS (AV) FISTULA CREATION RIGHT ARM;  Surgeon: Angelia Mould, MD;  Location: [place];  Service: Vascular;  Laterality: Right;  . BLADDER SURGERY  04-06-2015   removal of bladder/ [facility]   . CAPD INSERTION N/A 06/24/2016   Procedure: LAPAROSCOPIC INSERTION CONTINUOUS AMBULATORY PERITONEAL DIALYSIS  (CAPD) CATHETER;  Surgeon: Clovis Riley, MD;  Location: [place];  Service: General;  Laterality: N/A;  . CARPAL TUNNEL RELEASE  left hand x 2; right hand x 1; right elbow  . CERVICAL FUSION     x 2  . EYE SURGERY Bilateral    Cataract removal  . FISTULA SUPERFICIALIZATION Right 10/30/2015   Procedure: SUPERFICIALIZATION BRACHIOCEPHALIC ARTERIOVENOUS FISTULA Right Arm;  Surgeon: Angelia Mould, MD;  Location: [place];  Service: Vascular;  Laterality: Right;  . Hemocatheter    . IR GENERIC HISTORICAL Right 04/29/2016   IR THROMBECTOMY AV FISTULA W/THROMBOLYSIS/PTA INC/SHUNT/IMG RIGHT 04/29/2016 Arne Cleveland, MD [facility]-INTER[facility] RAD  . IR GENERIC HISTORICAL  04/29/2016   IR US GUIDE VASC ACCESS RIGHT 04/29/2016 Arne Cleveland, MD [facility]INTER[facility] RAD  . KNEE ARTHROSCOPY Bilateral    bilateral  . KNEE ARTHROSCOPY WITH MEDIAL MENISECTOMY Left 02/26/2013   Procedure: KNEE ARTHROSCOPY WITH MEDIAL MENISECTOMY;  Surgeon: Garald Balding, MD;   Location: [place];  Service: Orthopedics;  Laterality: Left;  . LIGATION OF COMPETING BRANCHES OF ARTERIOVENOUS FISTULA Right 10/30/2015   Procedure: LIGATION OF COMPETING BRANCHES OF BRACHIOCEPHALIC ARTERIOVENOUS FISTULA;  Surgeon: Angelia Mould, MD;  Location: [place];  Service: Vascular;  Laterality: Right;  . NEPHRECTOMY Right September 12, 2012  . NEPHRECTOMY Left 04-06-2015   [place] Right 09/07/2015   Procedure: Fistulagram;  Surgeon: Angelia Mould, MD;  Location: [place] CV LAB;  Service: Cardiovascular;  Laterality: Right;  ARM  . PERIPHERAL VASCULAR CATHETERIZATION Right 09/07/2015   Procedure: Peripheral Vascular Balloon Angioplasty;  Surgeon: Angelia Mould, MD;  Location: [place] CV LAB;  Service: Cardiovascular;  Laterality: Right;  upper arm venous  . PROSTATECTOMY  04-06-2015   [place] Bilateral    bilateral  . TOTAL KNEE ARTHROPLASTY  01/24/2012   Procedure: TOTAL KNEE ARTHROPLASTY;  Surgeon: Garald Balding, MD;  Location: [place];  Service: Orthopedics;  Laterality: Right;  RIGHT TOTAL KNEE REPLACEMENT    Family History  Problem Relation Age of Onset  . Cancer Mother   . Hypertension Father   . Atrial fibrillation Father   . Heart disease Father     Social History Social History  Substance Use Topics  . Smoking status: Never Smoker  . Smokeless tobacco: Never Used  . Alcohol use No    Current Outpatient Prescriptions  Medication Sig Dispense Refill  . ALPRAZolam (XANAX) 1 MG tablet Take 1 mg by mouth 2 (two) times daily as needed for anxiety.     . clobetasol cream (TEMOVATE) 1.61 % Apply 1 application topically 2 (two) times daily.    . fluticasone (FLONASE) 50 MCG/ACT nasal spray Place 1 spray into both nostrils daily.     Marland Kitchen oxyCODONE-acetaminophen (PERCOCET/ROXICET) 5-325 MG tablet 1 tablet every 4 (four) hours as needed.     [name] Glycol-Propyl Glycol (SYSTANE OP) Place 1 drop into both eyes daily as needed (for dry, itchy eyes).     No current facility-administered medications for this visit.     No Known Allergies  Review of Systems  Constitutional: Positive for activity change and fatigue. Negative for appetite change, fever and unexpected weight change.  HENT: Negative for trouble swallowing and voice change.   Eyes: Negative for visual disturbance.  Respiratory: Positive for cough and shortness of breath. Negative for wheezing.   Cardiovascular: Negative for chest pain and leg swelling.  Gastrointestinal: Negative for abdominal pain and blood in stool.  Genitourinary:       Anuric  Musculoskeletal: Positive for arthralgias,  back pain and joint swelling.  Neurological: Negative for seizures, syncope and weakness.  Hematological: Negative for adenopathy. Does not bruise/bleed easily.  Psychiatric/Behavioral: Positive for dysphoric mood. The patient is nervous/anxious.   All other systems reviewed and are negative.   BP (!) 148/79 (BP Location: Left Arm, Patient Position: Sitting, Cuff Size: Large)   Pulse 64   Resp 16   Ht 5\' 10"  (1.778 m)   Wt 240 lb (108.9 kg)   SpO2 98% Comment: ON RA  BMI 34.44 kg/m  Physical Exam  Constitutional: He is oriented to person, place, and time. He appears well-developed and well-nourished. No distress.  HENT:  Head: Normocephalic and atraumatic.  Mouth/Throat: No oropharyngeal exudate.  Eyes: Conjunctivae and EOM are normal. No scleral icterus.  Neck: Neck supple. No tracheal deviation present. No thyromegaly present.  Cardiovascular: Normal rate, regular rhythm and normal heart sounds.   No murmur heard. Pulmonary/Chest: Effort normal. No respiratory distress. He has no wheezes. He has no rales.  Absent breath sounds right base  Abdominal: Soft. He exhibits no distension. There is no tenderness.  Musculoskeletal: He exhibits no edema.  Lymphadenopathy:     He has no cervical adenopathy.  Neurological: He is alert and oriented to person, place, and time. No cranial nerve deficit. He exhibits normal muscle tone.  Skin: Skin is warm and dry.  Vitals reviewed.    Diagnostic Tests: CT CHEST, ABDOMEN, AND PELVIS WITH CONTRAST  TECHNIQUE: Multidetector CT imaging of the chest, abdomen and pelvis was performed following the standard protocol during bolus administration of intravenous contrast.  CONTRAST:  114mL ISOVUE-300 IOPAMIDOL (ISOVUE-300) INJECTION 61%  COMPARISON:  Outside CT 08/01/2016 from [place]: CT CHEST FINDINGS  Cardiovascular: No significant vascular findings. Normal heart size. No pericardial effusion.  Mediastinum/Nodes: No axillary or supraclavicular adenopathy. Mediastinal hilar adenopathy. No pericardial fluid. Esophagus normal.  Lungs/Pleura: Moderate-sized layering RIGHT pleural effusion is decreased in volume from comparison CT of 08/11/2016. Rounded consolidation in the anterior aspect of the RIGHT middle lobe similar to measuring 4 cm (93, series 6, compared to 3 cm). LEFT lung is clear  Musculoskeletal: No aggressive osseous lesion.  CT ABDOMEN AND PELVIS FINDINGS  Hepatobiliary: No focal hepatic lesion. No biliary ductal dilatation. Gallbladder is normal. Common bile duct is normal.  Pancreas: Pancreas is normal. No ductal dilatation. No pancreatic inflammation.  Spleen: Normal spleen  Adrenals/urinary tract: Adrenal glands normal. No nodularity in nephrectomy bed.  Stomach/Bowel: Stomach, small bowel, appendix, and cecum are normal. The colon and rectosigmoid colon are normal.  Vascular/Lymphatic: Abdominal aortic normal caliber of mild calcification. No retroperitoneal lymphadenopathy.  Reproductive: Post prostatectomy.  Other: Moderate volume intraperitoneal free fluid related to peritoneal dialysis. Dialysis catheter coiled within the RIGHT  upper at pelvis. Fluid extends into a LEFT inguinal hernia.  Musculoskeletal: No aggressive osseous lesion.  IMPRESSION: Chest Impression:  1. Interval decrease in pleural effusion compared to prior CT. Effusion remains moderate-to-large. 2. Interval increase in size of rounded consolidation anterior aspect of the RIGHT middle lobe. Favor atelectasis. Recommend attention on follow-up. 3. No evidence of renal cell carcinoma metastasis in the thorax.  Abdomen / Pelvis Impression:  1. No evidence of local renal cell carcinoma recurrence within the nephrectomy bed. 2. No lymphadenopathy. 3. Moderate volume of intraperitoneal free fluid relate peritoneal dialysis. 4. LEFT inguinal hernia contains peritoneal fluid 5. No Skeletal metastasis.   Electronically Signed   By: Suzy Bouchard M.D.   On: 11/29/2016 11:25  From a  Care everywhere records I was able to find 2 negative pleural fluid cytologies. Cholesterol was less than 25 and triglycerides were 19  Impression: Mr. Berry is a 67 year old gentleman with history of renal cell carcinoma post resection and end-stage renal disease on peritoneal dialysis, who has a chronic recurring right pleural effusion. This first was noted about 2 months after his left nephrectomy. He says that the effusion did predate him starting peritoneal dialysis.  Unfortunately, I am picking this up in the middle with incomplete information. He does have a CT which shows a moderate to large right pleural effusion. There appears to be some rounded atelectasis in the right middle lobe, although I think that will have to be followed to be sure. There is no evidence of metastatic disease. As best I can determine he has not had a PET/CT.  I think the first order of business is to establish whether this is a transudate or exudate. I'm going to order a ultrasound-guided thoracentesis and check protein, glucose, LDH, pH, cell count with differential, cultures,  and repeat the cytology.  The next step will be determined by the fluid characteristics. At this point I would favor pleural catheter placement to keep the effusion drained over a major surgical procedure.  Plan: Thoracentesis for fluid studies  Return in 2 weeks with repeat chest x-ray.  Melrose Nakayama, MD [facility] [phone]

## 2016-12-02 ENCOUNTER — Encounter: Payer: Self-pay | Admitting: Family Medicine

## 2016-12-02 ENCOUNTER — Other Ambulatory Visit: Payer: Self-pay | Admitting: *Deleted

## 2016-12-02 ENCOUNTER — Ambulatory Visit (INDEPENDENT_AMBULATORY_CARE_PROVIDER_SITE_OTHER): Payer: Medicare Other | Admitting: Family Medicine

## 2016-12-02 ENCOUNTER — Encounter: Payer: Self-pay | Admitting: *Deleted

## 2016-12-02 VITALS — BP 120/74 | HR 72 | Temp 98.0°F | Resp 18 | Ht 70.0 in | Wt 236.1 lb

## 2016-12-02 DIAGNOSIS — Z992 Dependence on renal dialysis: Secondary | ICD-10-CM

## 2016-12-02 DIAGNOSIS — F419 Anxiety disorder, unspecified: Secondary | ICD-10-CM | POA: Diagnosis not present

## 2016-12-02 DIAGNOSIS — Z23 Encounter for immunization: Secondary | ICD-10-CM | POA: Diagnosis not present

## 2016-12-02 DIAGNOSIS — D631 Anemia in chronic kidney disease: Secondary | ICD-10-CM | POA: Diagnosis not present

## 2016-12-02 DIAGNOSIS — N186 End stage renal disease: Secondary | ICD-10-CM | POA: Diagnosis not present

## 2016-12-02 DIAGNOSIS — D509 Iron deficiency anemia, unspecified: Secondary | ICD-10-CM | POA: Diagnosis not present

## 2016-12-02 DIAGNOSIS — J90 Pleural effusion, not elsewhere classified: Secondary | ICD-10-CM

## 2016-12-02 NOTE — Progress Notes (Signed)
[place] Psychosocial Distress Screening Clinical Social Work  Clinical Social Work was referred by distress screening protocol.  The patient scored a 7 on the Psychosocial Distress Thermometer which indicates moderate distress. Clinical Social Worker attempted to contac patient to assess for distress and other psychosocial needs.  CSW left voicemail to return call.  ONCBCN DISTRESS SCREENING 11/08/2016  Screening Type Initial Screening  Distress experienced in past week (1-10) 7  Emotional problem type Nervousness/Anxiety;Feeling hopeless  Physical Problem type Pain;Sleep/insomnia;Breathing;Constipation/diarrhea    [name], MSW, LCSW, OSW-C Clinical Social Worker [facility] [phone]

## 2016-12-02 NOTE — Progress Notes (Signed)
Patient ID: Jesus [name], male    DOB: 1950-03-25, 66 y.o.   MRN: [id number]  Chief Complaint  Patient presents with  . Follow-up    3 week    Allergies Patient has no known allergies.  Subjective:   Jesus [name] is a 66 y.o. male who presents to [facility] today.  HPI Jesus [name] presents here for follow up. He reports that a lot has been done for his health since he was last seen here. He reports that he feels like he has got more accomplished for his health in the past month than he has over the past several years  He has been seen by Pulmonary and Oncology. Has upcoming pleurocentesis for effusion to help determine etiology. He reports that he did not get the referral for him to switch nephrology and dialysis center. However he reports that he did not call because he has been so busy with appointments.   Reports that he tired the celexa for his mood and he took it for several days and then it made him feel sick. Reports that mood is better than it was the last time that he was in here. Does not feel as sad. Reports that he is dealing with life better. He "just believes that things just don't work out well for him". But doing better.  He denies depression. Reports his appetite is good. Sleeps okay. Reports that he has many aches and pains specifically in his back, neck, and knees. He reports that he is feeling better. Does not want any more medication for his mood. Reports that he saw Dr. Talbert Cage and wants to make sure that he does not get very anemic again. Reports that when his hemoglobin gets below 8 he feels terrible.    Past Medical History:  Diagnosis Date  . Anemia   . Anxiety   . Arthritis   . Cancer [facility])    bladder, ureter, Bil kidneys  . Dermatitis    scaly bump occurs every few months, pt uses cortizone cream and it goes away  . ESRD (end stage renal disease) on dialysis ([place])    Bilateral Nephrectomies- due to cancer  . History of blood transfusion     . Hypertension   . Lumbar back pain   . Mental disorder   . Pleural effusion, right    s/p right thoracentesis 10/07/15 (no malignancy by cytology report)  . Shortness of breath dyspnea    Lying down gets short of breath    Past Surgical History:  Procedure Laterality Date  . AV FISTULA PLACEMENT Right 06/05/2015   Procedure: ARTERIOVENOUS (AV) FISTULA CREATION RIGHT ARM;  Surgeon: Angelia Mould, MD;  Location: [place];  Service: Vascular;  Laterality: Right;  . BLADDER SURGERY  04-06-2015   removal of bladder/ [facility]   . CAPD INSERTION N/A 06/24/2016   Procedure: LAPAROSCOPIC INSERTION CONTINUOUS AMBULATORY PERITONEAL DIALYSIS  (CAPD) CATHETER;  Surgeon: Clovis Riley, MD;  Location: [place];  Service: General;  Laterality: N/A;  . CARPAL TUNNEL RELEASE     left hand x 2; right hand x 1; right elbow  . CERVICAL FUSION     x 2  . EYE SURGERY Bilateral    Cataract removal  . FISTULA SUPERFICIALIZATION Right 10/30/2015   Procedure: SUPERFICIALIZATION BRACHIOCEPHALIC ARTERIOVENOUS FISTULA Right Arm;  Surgeon: Angelia Mould, MD;  Location: [place];  Service: Vascular;  Laterality: Right;  . Hemocatheter    .  IR GENERIC HISTORICAL Right 04/29/2016   IR THROMBECTOMY AV FISTULA W/THROMBOLYSIS/PTA INC/SHUNT/IMG RIGHT 04/29/2016 Arne Cleveland, MD [facility]INTER[facility]  . IR GENERIC HISTORICAL  04/29/2016   IR US GUIDE VASC ACCESS RIGHT 04/29/2016 Arne Cleveland, MD [facility]INTER[facility] RAD  . KNEE ARTHROSCOPY Bilateral    bilateral  . KNEE ARTHROSCOPY WITH MEDIAL MENISECTOMY Left 02/26/2013   Procedure: KNEE ARTHROSCOPY WITH MEDIAL MENISECTOMY;  Surgeon: Garald Balding, MD;  Location: [place];  Service: Orthopedics;  Laterality: Left;  . LIGATION OF COMPETING BRANCHES OF ARTERIOVENOUS FISTULA Right 10/30/2015   Procedure: LIGATION OF COMPETING BRANCHES OF BRACHIOCEPHALIC ARTERIOVENOUS FISTULA;  Surgeon: Angelia Mould, MD;  Location: [place];  Service: Vascular;  Laterality: Right;   . NEPHRECTOMY Right September 12, 2012  . NEPHRECTOMY Left 04-06-2015   [place] Right 09/07/2015   Procedure: Fistulagram;  Surgeon: Angelia Mould, MD;  Location: [place] CV LAB;  Service: Cardiovascular;  Laterality: Right;  ARM  . PERIPHERAL VASCULAR CATHETERIZATION Right 09/07/2015   Procedure: Peripheral Vascular Balloon Angioplasty;  Surgeon: Angelia Mould, MD;  Location: [place] CV [facility];  Service: Cardiovascular;  Laterality: Right;  upper arm venous  . PROSTATECTOMY  04-06-2015   [place] Bilateral    bilateral  . TOTAL KNEE ARTHROPLASTY  01/24/2012   Procedure: TOTAL KNEE ARTHROPLASTY;  Surgeon: Garald Balding, MD;  Location: [place];  Service: Orthopedics;  Laterality: Right;  RIGHT TOTAL KNEE REPLACEMENT    Family History  Problem Relation Age of Onset  . Cancer Mother   . Hypertension Father   . Atrial fibrillation Father   . Heart disease Father      Social History   Social History  . Marital status: Widowed    Spouse name: N/A  . Number of children: N/A  . Years of education: N/A   Social History Main Topics  . Smoking status: Never Smoker  . Smokeless tobacco: Never Used  . Alcohol use No  . Drug use: No  . Sexual activity: Not Asked   Other Topics Concern  . None   Social History Narrative   Lives alone. Manages well per patient report. Does not cook much. Eats out a lot. Eats all food groups. Does not eat much fruit. Married in the past. Has one son. Used to drive a truck.     Review of Systems   Objective:   BP 120/74 (BP Location: Left Arm, Patient Position: Sitting, Cuff Size: Large)   Pulse 72   Temp 98 F (36.7 C) (Temporal)   Resp 18   Ht 5\' 10"  (1.778 m)   Wt 236 lb 1.9 oz (107.1 kg)   SpO2 98%   BMI 33.88 kg/m   Physical Exam Vital signs stable. Normocephalic atraumatic. Pupils equally round reactive to light.  Lungs clear to auscultation. Heart regular rate and rhythm. Mood improved. Patient smiles and laughs some during the exam. He reports feelings of hope. Denies suicidal or homicidal ideations.  Assessment and Plan   1. ESRD on peritoneal dialysis [facility]) Referral placed. On the referral, it is noted that patient request certain dialysis group. The name of the group and telephone number is written on the referral. Patient was told if he does not get the referral to please call our office. He voiced agreement. - Ambulatory referral to Nephrology  2. Immunization due Immunization records requested from previous doctor office. - Pneumococcal conjugate vaccine  13-valent IM  3. Anemia in ESRD (end-stage renal disease) ([place]) Patient to return to clinic in 1 month for blood count. At that time if his hemoglobin is dropping and he is less than or equal to 8.0, and we will discuss with Dr. Talbert Cage regarding transfusion. - CBC with Differential/Platelet  4. Anxiety and dysthymic mood  I do believe that his mood is improved. He is able to report feelings of hopefulness. He is happy with some of his new physicians that are taking care of him. Patient was counseled that if his mood worsens that he needs to return to clinic.    Return in about 3 months (around 03/04/2017) for follow up. Caren Macadam, MD 12/02/2016

## 2016-12-03 DIAGNOSIS — Z992 Dependence on renal dialysis: Secondary | ICD-10-CM | POA: Diagnosis not present

## 2016-12-03 DIAGNOSIS — D509 Iron deficiency anemia, unspecified: Secondary | ICD-10-CM | POA: Diagnosis not present

## 2016-12-03 DIAGNOSIS — D631 Anemia in chronic kidney disease: Secondary | ICD-10-CM | POA: Diagnosis not present

## 2016-12-03 DIAGNOSIS — N186 End stage renal disease: Secondary | ICD-10-CM | POA: Diagnosis not present

## 2016-12-04 DIAGNOSIS — D631 Anemia in chronic kidney disease: Secondary | ICD-10-CM | POA: Diagnosis not present

## 2016-12-04 DIAGNOSIS — N186 End stage renal disease: Secondary | ICD-10-CM | POA: Diagnosis not present

## 2016-12-04 DIAGNOSIS — Z992 Dependence on renal dialysis: Secondary | ICD-10-CM | POA: Diagnosis not present

## 2016-12-04 DIAGNOSIS — D509 Iron deficiency anemia, unspecified: Secondary | ICD-10-CM | POA: Diagnosis not present

## 2016-12-05 DIAGNOSIS — N186 End stage renal disease: Secondary | ICD-10-CM | POA: Diagnosis not present

## 2016-12-05 DIAGNOSIS — D509 Iron deficiency anemia, unspecified: Secondary | ICD-10-CM | POA: Diagnosis not present

## 2016-12-05 DIAGNOSIS — Z992 Dependence on renal dialysis: Secondary | ICD-10-CM | POA: Diagnosis not present

## 2016-12-05 DIAGNOSIS — D631 Anemia in chronic kidney disease: Secondary | ICD-10-CM | POA: Diagnosis not present

## 2016-12-06 ENCOUNTER — Encounter (HOSPITAL_COMMUNITY): Payer: Self-pay | Admitting: General Surgery

## 2016-12-06 ENCOUNTER — Ambulatory Visit (HOSPITAL_COMMUNITY)
Admission: RE | Admit: 2016-12-06 | Discharge: 2016-12-06 | Disposition: A | Discharge status: Home / Self Care | Payer: Medicare Other | Source: Ambulatory Visit | Attending: General Surgery | Admitting: General Surgery

## 2016-12-06 ENCOUNTER — Other Ambulatory Visit (HOSPITAL_COMMUNITY): Payer: Self-pay | Admitting: General Surgery

## 2016-12-06 ENCOUNTER — Ambulatory Visit (HOSPITAL_COMMUNITY)
Admission: RE | Admit: 2016-12-06 | Discharge: 2016-12-06 | Disposition: A | Discharge status: Home / Self Care | Payer: Medicare Other | Source: Ambulatory Visit | Attending: Thoracic Surgery (Cardiothoracic Vascular Surgery) | Admitting: Thoracic Surgery (Cardiothoracic Vascular Surgery)

## 2016-12-06 DIAGNOSIS — J90 Pleural effusion, not elsewhere classified: Secondary | ICD-10-CM | POA: Diagnosis not present

## 2016-12-06 DIAGNOSIS — Z9889 Other specified postprocedural states: Secondary | ICD-10-CM

## 2016-12-06 DIAGNOSIS — Z85528 Personal history of other malignant neoplasm of kidney: Secondary | ICD-10-CM | POA: Insufficient documentation

## 2016-12-06 DIAGNOSIS — D509 Iron deficiency anemia, unspecified: Secondary | ICD-10-CM | POA: Diagnosis not present

## 2016-12-06 DIAGNOSIS — R846 Abnormal cytological findings in specimens from respiratory organs and thorax: Secondary | ICD-10-CM | POA: Diagnosis not present

## 2016-12-06 DIAGNOSIS — N186 End stage renal disease: Secondary | ICD-10-CM | POA: Diagnosis not present

## 2016-12-06 DIAGNOSIS — Z905 Acquired absence of kidney: Secondary | ICD-10-CM | POA: Diagnosis not present

## 2016-12-06 DIAGNOSIS — Z992 Dependence on renal dialysis: Secondary | ICD-10-CM | POA: Diagnosis not present

## 2016-12-06 DIAGNOSIS — J9811 Atelectasis: Secondary | ICD-10-CM | POA: Diagnosis not present

## 2016-12-06 DIAGNOSIS — D631 Anemia in chronic kidney disease: Secondary | ICD-10-CM | POA: Diagnosis not present

## 2016-12-06 HISTORY — PX: IR THORACENTESIS ASP PLEURAL SPACE W/IMG GUIDE: IMG5380

## 2016-12-06 LAB — BODY FLUID CELL COUNT WITH DIFFERENTIAL
Eos, Fluid: 0 %
Lymphs, Fluid: 4 %
Monocyte-Macrophage-Serous Fluid: 95 % — ABNORMAL HIGH (ref 50–90)
Neutrophil Count, Fluid: 1 % (ref 0–25)
WBC FLUID: 85 uL (ref 0–1000)

## 2016-12-06 LAB — GRAM STAIN: GRAM STAIN: NONE SEEN

## 2016-12-06 LAB — PROTEIN, PLEURAL OR PERITONEAL FLUID: Total protein, fluid: 3.2 g/dL

## 2016-12-06 LAB — LACTATE DEHYDROGENASE, PLEURAL OR PERITONEAL FLUID: LD, Fluid: 105 U/L — ABNORMAL HIGH (ref 3–23)

## 2016-12-06 LAB — GLUCOSE, PLEURAL OR PERITONEAL FLUID: GLUCOSE FL: 89 mg/dL

## 2016-12-06 MED ORDER — LIDOCAINE HCL 1 % IJ SOLN
INTRAMUSCULAR | Status: AC
  Filled 2016-12-06: qty 20

## 2016-12-06 MED ORDER — LIDOCAINE HCL (PF) 1 % IJ SOLN
INTRAMUSCULAR | Status: DC | PRN
  Administered 2016-12-06: 10 mL

## 2016-12-06 NOTE — Procedures (Signed)
Ultrasound-guided diagnostic and therapeutic right thoracentesis performed yielding 2.2 liters of serous colored fluid. No immediate complications. Follow-up chest x-ray pending.       Jesus [name] E 11:17 AM 12/06/2016

## 2016-12-07 ENCOUNTER — Institutional Professional Consult (permissible substitution): Payer: Medicare Other | Admitting: Emergency Medicine

## 2016-12-07 DIAGNOSIS — N186 End stage renal disease: Secondary | ICD-10-CM | POA: Diagnosis not present

## 2016-12-07 DIAGNOSIS — Z992 Dependence on renal dialysis: Secondary | ICD-10-CM | POA: Diagnosis not present

## 2016-12-07 DIAGNOSIS — D631 Anemia in chronic kidney disease: Secondary | ICD-10-CM | POA: Diagnosis not present

## 2016-12-07 DIAGNOSIS — D509 Iron deficiency anemia, unspecified: Secondary | ICD-10-CM | POA: Diagnosis not present

## 2016-12-07 LAB — PH, BODY FLUID: PH, BODY FLUID: 7.8

## 2016-12-08 DIAGNOSIS — J90 Pleural effusion, not elsewhere classified: Secondary | ICD-10-CM | POA: Diagnosis not present

## 2016-12-08 DIAGNOSIS — I10 Essential (primary) hypertension: Secondary | ICD-10-CM | POA: Diagnosis not present

## 2016-12-08 DIAGNOSIS — N186 End stage renal disease: Secondary | ICD-10-CM | POA: Diagnosis not present

## 2016-12-08 DIAGNOSIS — Z992 Dependence on renal dialysis: Secondary | ICD-10-CM | POA: Diagnosis not present

## 2016-12-08 DIAGNOSIS — Z85528 Personal history of other malignant neoplasm of kidney: Secondary | ICD-10-CM | POA: Diagnosis not present

## 2016-12-08 DIAGNOSIS — D631 Anemia in chronic kidney disease: Secondary | ICD-10-CM | POA: Diagnosis not present

## 2016-12-08 DIAGNOSIS — D509 Iron deficiency anemia, unspecified: Secondary | ICD-10-CM | POA: Diagnosis not present

## 2016-12-09 ENCOUNTER — Ambulatory Visit: Payer: Medicare Other | Admitting: Oncology

## 2016-12-09 DIAGNOSIS — D509 Iron deficiency anemia, unspecified: Secondary | ICD-10-CM | POA: Diagnosis not present

## 2016-12-09 DIAGNOSIS — Z992 Dependence on renal dialysis: Secondary | ICD-10-CM | POA: Diagnosis not present

## 2016-12-09 DIAGNOSIS — N186 End stage renal disease: Secondary | ICD-10-CM | POA: Diagnosis not present

## 2016-12-09 DIAGNOSIS — D631 Anemia in chronic kidney disease: Secondary | ICD-10-CM | POA: Diagnosis not present

## 2016-12-10 DIAGNOSIS — D509 Iron deficiency anemia, unspecified: Secondary | ICD-10-CM | POA: Diagnosis not present

## 2016-12-10 DIAGNOSIS — N186 End stage renal disease: Secondary | ICD-10-CM | POA: Diagnosis not present

## 2016-12-10 DIAGNOSIS — D631 Anemia in chronic kidney disease: Secondary | ICD-10-CM | POA: Diagnosis not present

## 2016-12-10 DIAGNOSIS — Z992 Dependence on renal dialysis: Secondary | ICD-10-CM | POA: Diagnosis not present

## 2016-12-11 DIAGNOSIS — D509 Iron deficiency anemia, unspecified: Secondary | ICD-10-CM | POA: Diagnosis not present

## 2016-12-11 DIAGNOSIS — Z992 Dependence on renal dialysis: Secondary | ICD-10-CM | POA: Diagnosis not present

## 2016-12-11 DIAGNOSIS — D631 Anemia in chronic kidney disease: Secondary | ICD-10-CM | POA: Diagnosis not present

## 2016-12-11 DIAGNOSIS — N186 End stage renal disease: Secondary | ICD-10-CM | POA: Diagnosis not present

## 2016-12-11 LAB — CULTURE, BODY FLUID-BOTTLE: CULTURE: NO GROWTH

## 2016-12-11 LAB — CULTURE, BODY FLUID W GRAM STAIN -BOTTLE

## 2016-12-12 DIAGNOSIS — N186 End stage renal disease: Secondary | ICD-10-CM | POA: Diagnosis not present

## 2016-12-12 DIAGNOSIS — Z992 Dependence on renal dialysis: Secondary | ICD-10-CM | POA: Diagnosis not present

## 2016-12-12 DIAGNOSIS — D509 Iron deficiency anemia, unspecified: Secondary | ICD-10-CM | POA: Diagnosis not present

## 2016-12-12 DIAGNOSIS — D631 Anemia in chronic kidney disease: Secondary | ICD-10-CM | POA: Diagnosis not present

## 2016-12-13 DIAGNOSIS — D631 Anemia in chronic kidney disease: Secondary | ICD-10-CM | POA: Diagnosis not present

## 2016-12-13 DIAGNOSIS — Z992 Dependence on renal dialysis: Secondary | ICD-10-CM | POA: Diagnosis not present

## 2016-12-13 DIAGNOSIS — N186 End stage renal disease: Secondary | ICD-10-CM | POA: Diagnosis not present

## 2016-12-13 DIAGNOSIS — D509 Iron deficiency anemia, unspecified: Secondary | ICD-10-CM | POA: Diagnosis not present

## 2016-12-14 DIAGNOSIS — D509 Iron deficiency anemia, unspecified: Secondary | ICD-10-CM | POA: Diagnosis not present

## 2016-12-14 DIAGNOSIS — Z992 Dependence on renal dialysis: Secondary | ICD-10-CM | POA: Diagnosis not present

## 2016-12-14 DIAGNOSIS — N186 End stage renal disease: Secondary | ICD-10-CM | POA: Diagnosis not present

## 2016-12-14 DIAGNOSIS — D631 Anemia in chronic kidney disease: Secondary | ICD-10-CM | POA: Diagnosis not present

## 2016-12-15 DIAGNOSIS — D631 Anemia in chronic kidney disease: Secondary | ICD-10-CM | POA: Diagnosis not present

## 2016-12-15 DIAGNOSIS — N186 End stage renal disease: Secondary | ICD-10-CM | POA: Diagnosis not present

## 2016-12-15 DIAGNOSIS — D509 Iron deficiency anemia, unspecified: Secondary | ICD-10-CM | POA: Diagnosis not present

## 2016-12-15 DIAGNOSIS — Z992 Dependence on renal dialysis: Secondary | ICD-10-CM | POA: Diagnosis not present

## 2016-12-16 DIAGNOSIS — Z992 Dependence on renal dialysis: Secondary | ICD-10-CM | POA: Diagnosis not present

## 2016-12-16 DIAGNOSIS — D631 Anemia in chronic kidney disease: Secondary | ICD-10-CM | POA: Diagnosis not present

## 2016-12-16 DIAGNOSIS — N186 End stage renal disease: Secondary | ICD-10-CM | POA: Diagnosis not present

## 2016-12-16 DIAGNOSIS — D509 Iron deficiency anemia, unspecified: Secondary | ICD-10-CM | POA: Diagnosis not present

## 2016-12-17 DIAGNOSIS — D509 Iron deficiency anemia, unspecified: Secondary | ICD-10-CM | POA: Diagnosis not present

## 2016-12-17 DIAGNOSIS — D631 Anemia in chronic kidney disease: Secondary | ICD-10-CM | POA: Diagnosis not present

## 2016-12-17 DIAGNOSIS — N186 End stage renal disease: Secondary | ICD-10-CM | POA: Diagnosis not present

## 2016-12-17 DIAGNOSIS — Z992 Dependence on renal dialysis: Secondary | ICD-10-CM | POA: Diagnosis not present

## 2016-12-18 DIAGNOSIS — D631 Anemia in chronic kidney disease: Secondary | ICD-10-CM | POA: Diagnosis not present

## 2016-12-18 DIAGNOSIS — Z992 Dependence on renal dialysis: Secondary | ICD-10-CM | POA: Diagnosis not present

## 2016-12-18 DIAGNOSIS — N186 End stage renal disease: Secondary | ICD-10-CM | POA: Diagnosis not present

## 2016-12-18 DIAGNOSIS — D509 Iron deficiency anemia, unspecified: Secondary | ICD-10-CM | POA: Diagnosis not present

## 2016-12-19 ENCOUNTER — Other Ambulatory Visit: Payer: Self-pay | Admitting: Thoracic Surgery (Cardiothoracic Vascular Surgery)

## 2016-12-19 DIAGNOSIS — N186 End stage renal disease: Secondary | ICD-10-CM | POA: Diagnosis not present

## 2016-12-19 DIAGNOSIS — D509 Iron deficiency anemia, unspecified: Secondary | ICD-10-CM | POA: Diagnosis not present

## 2016-12-19 DIAGNOSIS — J90 Pleural effusion, not elsewhere classified: Secondary | ICD-10-CM

## 2016-12-19 DIAGNOSIS — D631 Anemia in chronic kidney disease: Secondary | ICD-10-CM | POA: Diagnosis not present

## 2016-12-19 DIAGNOSIS — Z992 Dependence on renal dialysis: Secondary | ICD-10-CM | POA: Diagnosis not present

## 2016-12-20 ENCOUNTER — Ambulatory Visit
Admission: RE | Admit: 2016-12-20 | Discharge: 2016-12-20 | Disposition: A | Discharge status: Home / Self Care | Payer: Medicare Other | Source: Ambulatory Visit | Attending: Thoracic Surgery (Cardiothoracic Vascular Surgery) | Admitting: Thoracic Surgery (Cardiothoracic Vascular Surgery)

## 2016-12-20 ENCOUNTER — Ambulatory Visit (INDEPENDENT_AMBULATORY_CARE_PROVIDER_SITE_OTHER): Payer: Medicare Other | Admitting: Thoracic Surgery (Cardiothoracic Vascular Surgery)

## 2016-12-20 ENCOUNTER — Encounter: Payer: Self-pay | Admitting: Thoracic Surgery (Cardiothoracic Vascular Surgery)

## 2016-12-20 VITALS — BP 119/72 | HR 79 | Ht 70.0 in | Wt 243.0 lb

## 2016-12-20 DIAGNOSIS — Z992 Dependence on renal dialysis: Secondary | ICD-10-CM | POA: Diagnosis not present

## 2016-12-20 DIAGNOSIS — D509 Iron deficiency anemia, unspecified: Secondary | ICD-10-CM | POA: Diagnosis not present

## 2016-12-20 DIAGNOSIS — J90 Pleural effusion, not elsewhere classified: Secondary | ICD-10-CM

## 2016-12-20 DIAGNOSIS — D631 Anemia in chronic kidney disease: Secondary | ICD-10-CM | POA: Diagnosis not present

## 2016-12-20 DIAGNOSIS — N186 End stage renal disease: Secondary | ICD-10-CM | POA: Diagnosis not present

## 2016-12-20 DIAGNOSIS — M47812 Spondylosis without myelopathy or radiculopathy, cervical region: Secondary | ICD-10-CM | POA: Diagnosis not present

## 2016-12-20 MED ORDER — PREDNISONE 10 MG (21) PO TBPK: ORAL_TABLET | ORAL | 0 refills | Status: DC

## 2016-12-20 NOTE — Progress Notes (Signed)
[address]       ,[place] [address]             [phone]     HPI: Jesus [name] returns for follow-up of his chronic right pleural effusion.  Mr. Leib is a 66 year old gentleman with a past history of renal cell carcinoma with bilateral nephrectomies, most recently a left nephrectomy in February 2017. Due to that he has end-stage renal disease and is on peritoneal dialysis. Other medical problems include hypertension, anemia, anxiety, arthritis, chronic back pain, and dermatitis.  He is first noted to have a right pleural effusion in April 2017. He had repeated thoracenteses about every 3-4 months at [facility]. He says that no one ever told him why he had fluid or had a plan to do anything about that.  I sent him for a thoracentesis to obtain fluid for study. That was done 2 weeks ago. Cytology was negative for malignancy. The glucose was 89, protein 3.2, pH was 7.8, there were 85 white blood cells, the LDH was 105.  He did note improvement but not complete resolution of the shortness of breath. He says it is particularly significant when he bends over.  Past Medical History:  Diagnosis Date  . Anemia   . Anxiety   . Arthritis   . Cancer [facility])    bladder, ureter, Bil kidneys  . Dermatitis    scaly bump occurs every few months, pt uses cortizone cream and it goes away  . ESRD (end stage renal disease) on dialysis ([place])    Bilateral Nephrectomies- due to cancer  . History of blood transfusion   . Hypertension   . Lumbar back pain   . Mental disorder   . Pleural effusion, right    s/p right thoracentesis 10/07/15 (no malignancy by cytology report)  . Shortness of breath dyspnea    Lying down gets short of breath    Current Outpatient Prescriptions  Medication Sig Dispense Refill  . ALPRAZolam (XANAX) 1 MG tablet Take 1 mg by mouth 2 (two) times daily as needed for anxiety.     . clobetasol cream (TEMOVATE) 2.75 % Apply 1 application topically 2  (two) times daily.    . fluticasone (FLONASE) 50 MCG/ACT nasal spray Place 1 spray into both nostrils daily.     Marland Kitchen oxyCODONE-acetaminophen (PERCOCET/ROXICET) 5-325 MG tablet 1 tablet every 4 (four) hours as needed.     [name] Glycol-Propyl Glycol (SYSTANE OP) Place 1 drop into both eyes daily as needed (for dry, itchy eyes).    . predniSONE (STERAPRED UNI-PAK 21 TAB) 10 MG (21) TBPK tablet Take 6 tabs on day 1, 5 tabs on day 2, 4 tabs on day 3, 3 tabs on day 4, 2 tabs on day 5 and 1 tab on day 6 21 tablet 0   No current facility-administered medications for this visit.     Physical Exam BP 119/72   Pulse 79   Ht 5\' 10"  (1.778 m)   Wt 243 lb (110.2 kg)   SpO2 99%   BMI 34.25 kg/m  66 year old man in no acute distress Alert and oriented 3 Mildly diminished breath sounds right base Cardiac regular rate and rhythm  Diagnostic Tests: Pleural fluid studies as noted above  CHEST  2 VIEW  COMPARISON:  12/06/2016  FINDINGS: There is a small right pleural effusion. There is mild right basilar atelectasis. There is no left pleural effusion. There is no pneumothorax. The heart  and mediastinal contours are unremarkable.  The osseous structures are unremarkable.  IMPRESSION: Small right pleural effusion.   Electronically Signed   By: Kathreen Devoid   On: 12/20/2016 12:51 I personally reviewed the CT and concur with the findings noted above  Impression: Mr. Lollar is a 66 year old man with a recurrent right pleural effusion. This was followed in treated for about a year and a half at [facility] before he came here for second opinion. We did a thoracentesis on him 2 weeks ago which drained the majority of the effusion. There was a small amount of loculated fluid remaining. The fluid was consistent with an exudate. The LDH was elevated suggesting chronic inflammation. There were relatively few white cells and cytology was negative. Culture showed no growth  Exudative of  pleural effusion- most likely postpneumonic as it developed in the postoperative period after his nephrectomy. He has not had a trial of steroids. I am going to give him a prednisone taper starting to 60 mg tapering to 10 mg daily over 6 days. If that fails the next step would be VATS.  Plan: Prednisone taper  Return in 2 weeks with PA and lateral chest x-ray  Melrose Nakayama, MD [facility] [phone]

## 2016-12-21 DIAGNOSIS — D631 Anemia in chronic kidney disease: Secondary | ICD-10-CM | POA: Diagnosis not present

## 2016-12-21 DIAGNOSIS — N186 End stage renal disease: Secondary | ICD-10-CM | POA: Diagnosis not present

## 2016-12-21 DIAGNOSIS — Z992 Dependence on renal dialysis: Secondary | ICD-10-CM | POA: Diagnosis not present

## 2016-12-21 DIAGNOSIS — D509 Iron deficiency anemia, unspecified: Secondary | ICD-10-CM | POA: Diagnosis not present

## 2016-12-22 ENCOUNTER — Other Ambulatory Visit: Payer: Self-pay | Admitting: Neurosurgery

## 2016-12-22 DIAGNOSIS — D509 Iron deficiency anemia, unspecified: Secondary | ICD-10-CM | POA: Diagnosis not present

## 2016-12-22 DIAGNOSIS — D631 Anemia in chronic kidney disease: Secondary | ICD-10-CM | POA: Diagnosis not present

## 2016-12-22 DIAGNOSIS — Z992 Dependence on renal dialysis: Secondary | ICD-10-CM | POA: Diagnosis not present

## 2016-12-22 DIAGNOSIS — M47812 Spondylosis without myelopathy or radiculopathy, cervical region: Secondary | ICD-10-CM

## 2016-12-22 DIAGNOSIS — N186 End stage renal disease: Secondary | ICD-10-CM | POA: Diagnosis not present

## 2016-12-23 DIAGNOSIS — N186 End stage renal disease: Secondary | ICD-10-CM | POA: Diagnosis not present

## 2016-12-23 DIAGNOSIS — D509 Iron deficiency anemia, unspecified: Secondary | ICD-10-CM | POA: Diagnosis not present

## 2016-12-23 DIAGNOSIS — Z992 Dependence on renal dialysis: Secondary | ICD-10-CM | POA: Diagnosis not present

## 2016-12-23 DIAGNOSIS — D631 Anemia in chronic kidney disease: Secondary | ICD-10-CM | POA: Diagnosis not present

## 2016-12-24 DIAGNOSIS — D631 Anemia in chronic kidney disease: Secondary | ICD-10-CM | POA: Diagnosis not present

## 2016-12-24 DIAGNOSIS — D509 Iron deficiency anemia, unspecified: Secondary | ICD-10-CM | POA: Diagnosis not present

## 2016-12-24 DIAGNOSIS — Z992 Dependence on renal dialysis: Secondary | ICD-10-CM | POA: Diagnosis not present

## 2016-12-24 DIAGNOSIS — N186 End stage renal disease: Secondary | ICD-10-CM | POA: Diagnosis not present

## 2016-12-25 DIAGNOSIS — D509 Iron deficiency anemia, unspecified: Secondary | ICD-10-CM | POA: Diagnosis not present

## 2016-12-25 DIAGNOSIS — Z992 Dependence on renal dialysis: Secondary | ICD-10-CM | POA: Diagnosis not present

## 2016-12-25 DIAGNOSIS — N186 End stage renal disease: Secondary | ICD-10-CM | POA: Diagnosis not present

## 2016-12-25 DIAGNOSIS — D631 Anemia in chronic kidney disease: Secondary | ICD-10-CM | POA: Diagnosis not present

## 2016-12-26 DIAGNOSIS — N186 End stage renal disease: Secondary | ICD-10-CM | POA: Diagnosis not present

## 2016-12-26 DIAGNOSIS — Z992 Dependence on renal dialysis: Secondary | ICD-10-CM | POA: Diagnosis not present

## 2016-12-26 DIAGNOSIS — D509 Iron deficiency anemia, unspecified: Secondary | ICD-10-CM | POA: Diagnosis not present

## 2016-12-26 DIAGNOSIS — D631 Anemia in chronic kidney disease: Secondary | ICD-10-CM | POA: Diagnosis not present

## 2016-12-27 DIAGNOSIS — D509 Iron deficiency anemia, unspecified: Secondary | ICD-10-CM | POA: Diagnosis not present

## 2016-12-27 DIAGNOSIS — D631 Anemia in chronic kidney disease: Secondary | ICD-10-CM | POA: Diagnosis not present

## 2016-12-27 DIAGNOSIS — N186 End stage renal disease: Secondary | ICD-10-CM | POA: Diagnosis not present

## 2016-12-27 DIAGNOSIS — Z992 Dependence on renal dialysis: Secondary | ICD-10-CM | POA: Diagnosis not present

## 2016-12-28 DIAGNOSIS — D509 Iron deficiency anemia, unspecified: Secondary | ICD-10-CM | POA: Diagnosis not present

## 2016-12-28 DIAGNOSIS — Z992 Dependence on renal dialysis: Secondary | ICD-10-CM | POA: Diagnosis not present

## 2016-12-28 DIAGNOSIS — D631 Anemia in chronic kidney disease: Secondary | ICD-10-CM | POA: Diagnosis not present

## 2016-12-28 DIAGNOSIS — N186 End stage renal disease: Secondary | ICD-10-CM | POA: Diagnosis not present

## 2016-12-29 DIAGNOSIS — N186 End stage renal disease: Secondary | ICD-10-CM | POA: Diagnosis not present

## 2016-12-29 DIAGNOSIS — D631 Anemia in chronic kidney disease: Secondary | ICD-10-CM | POA: Diagnosis not present

## 2016-12-29 DIAGNOSIS — Z992 Dependence on renal dialysis: Secondary | ICD-10-CM | POA: Diagnosis not present

## 2016-12-29 DIAGNOSIS — D509 Iron deficiency anemia, unspecified: Secondary | ICD-10-CM | POA: Diagnosis not present

## 2016-12-29 DIAGNOSIS — N2581 Secondary hyperparathyroidism of renal origin: Secondary | ICD-10-CM | POA: Diagnosis not present

## 2016-12-30 DIAGNOSIS — D631 Anemia in chronic kidney disease: Secondary | ICD-10-CM | POA: Diagnosis not present

## 2016-12-30 DIAGNOSIS — D509 Iron deficiency anemia, unspecified: Secondary | ICD-10-CM | POA: Diagnosis not present

## 2016-12-30 DIAGNOSIS — Z992 Dependence on renal dialysis: Secondary | ICD-10-CM | POA: Diagnosis not present

## 2016-12-30 DIAGNOSIS — N2581 Secondary hyperparathyroidism of renal origin: Secondary | ICD-10-CM | POA: Diagnosis not present

## 2016-12-30 DIAGNOSIS — N186 End stage renal disease: Secondary | ICD-10-CM | POA: Diagnosis not present

## 2016-12-31 DIAGNOSIS — Z992 Dependence on renal dialysis: Secondary | ICD-10-CM | POA: Diagnosis not present

## 2016-12-31 DIAGNOSIS — D631 Anemia in chronic kidney disease: Secondary | ICD-10-CM | POA: Diagnosis not present

## 2016-12-31 DIAGNOSIS — N2581 Secondary hyperparathyroidism of renal origin: Secondary | ICD-10-CM | POA: Diagnosis not present

## 2016-12-31 DIAGNOSIS — N186 End stage renal disease: Secondary | ICD-10-CM | POA: Diagnosis not present

## 2016-12-31 DIAGNOSIS — D509 Iron deficiency anemia, unspecified: Secondary | ICD-10-CM | POA: Diagnosis not present

## 2017-01-01 DIAGNOSIS — N2581 Secondary hyperparathyroidism of renal origin: Secondary | ICD-10-CM | POA: Diagnosis not present

## 2017-01-01 DIAGNOSIS — Z992 Dependence on renal dialysis: Secondary | ICD-10-CM | POA: Diagnosis not present

## 2017-01-01 DIAGNOSIS — D631 Anemia in chronic kidney disease: Secondary | ICD-10-CM | POA: Diagnosis not present

## 2017-01-01 DIAGNOSIS — D509 Iron deficiency anemia, unspecified: Secondary | ICD-10-CM | POA: Diagnosis not present

## 2017-01-01 DIAGNOSIS — N186 End stage renal disease: Secondary | ICD-10-CM | POA: Diagnosis not present

## 2017-01-02 ENCOUNTER — Other Ambulatory Visit: Payer: Self-pay | Admitting: Thoracic Surgery (Cardiothoracic Vascular Surgery)

## 2017-01-02 DIAGNOSIS — N2581 Secondary hyperparathyroidism of renal origin: Secondary | ICD-10-CM | POA: Diagnosis not present

## 2017-01-02 DIAGNOSIS — N186 End stage renal disease: Secondary | ICD-10-CM | POA: Diagnosis not present

## 2017-01-02 DIAGNOSIS — Z992 Dependence on renal dialysis: Secondary | ICD-10-CM | POA: Diagnosis not present

## 2017-01-02 DIAGNOSIS — J90 Pleural effusion, not elsewhere classified: Secondary | ICD-10-CM

## 2017-01-02 DIAGNOSIS — D509 Iron deficiency anemia, unspecified: Secondary | ICD-10-CM | POA: Diagnosis not present

## 2017-01-02 DIAGNOSIS — D631 Anemia in chronic kidney disease: Secondary | ICD-10-CM | POA: Diagnosis not present

## 2017-01-03 ENCOUNTER — Ambulatory Visit (INDEPENDENT_AMBULATORY_CARE_PROVIDER_SITE_OTHER): Payer: Medicare Other | Admitting: Thoracic Surgery (Cardiothoracic Vascular Surgery)

## 2017-01-03 ENCOUNTER — Ambulatory Visit
Admission: RE | Admit: 2017-01-03 | Discharge: 2017-01-03 | Disposition: A | Discharge status: Home / Self Care | Payer: Medicare Other | Source: Ambulatory Visit | Attending: Thoracic Surgery (Cardiothoracic Vascular Surgery) | Admitting: Thoracic Surgery (Cardiothoracic Vascular Surgery)

## 2017-01-03 ENCOUNTER — Other Ambulatory Visit: Payer: Self-pay

## 2017-01-03 VITALS — BP 149/81 | HR 70 | Ht 70.0 in | Wt 243.0 lb

## 2017-01-03 DIAGNOSIS — N186 End stage renal disease: Secondary | ICD-10-CM | POA: Diagnosis not present

## 2017-01-03 DIAGNOSIS — J90 Pleural effusion, not elsewhere classified: Secondary | ICD-10-CM | POA: Diagnosis not present

## 2017-01-03 DIAGNOSIS — D631 Anemia in chronic kidney disease: Secondary | ICD-10-CM | POA: Diagnosis not present

## 2017-01-03 DIAGNOSIS — Z992 Dependence on renal dialysis: Secondary | ICD-10-CM | POA: Diagnosis not present

## 2017-01-03 DIAGNOSIS — D509 Iron deficiency anemia, unspecified: Secondary | ICD-10-CM | POA: Diagnosis not present

## 2017-01-03 DIAGNOSIS — N2581 Secondary hyperparathyroidism of renal origin: Secondary | ICD-10-CM | POA: Diagnosis not present

## 2017-01-03 LAB — CBC WITH DIFFERENTIAL/PLATELET
BASOS PCT: 0.4 %
Basophils Absolute: 40 cells/uL (ref 0–200)
EOS PCT: 6 %
Eosinophils Absolute: 600 cells/uL — ABNORMAL HIGH (ref 15–500)
HCT: 23 % — ABNORMAL LOW (ref 38.5–50.0)
Hemoglobin: 8.2 g/dL — ABNORMAL LOW (ref 13.2–17.1)
Lymphs Abs: 730 cells/uL — ABNORMAL LOW (ref 850–3900)
MCH: 35.5 pg — ABNORMAL HIGH (ref 27.0–33.0)
MCHC: 35.7 g/dL (ref 32.0–36.0)
MCV: 99.6 fL (ref 80.0–100.0)
MONOS PCT: 14.6 %
MPV: 8.9 fL (ref 7.5–12.5)
Neutro Abs: 7170 cells/uL (ref 1500–7800)
Neutrophils Relative %: 71.7 %
PLATELETS: 199 10*3/uL (ref 140–400)
RBC: 2.31 10*6/uL — AB (ref 4.20–5.80)
RDW: 14 % (ref 11.0–15.0)
TOTAL LYMPHOCYTE: 7.3 %
WBC: 10 10*3/uL (ref 3.8–10.8)
WBCMIX: 1460 {cells}/uL — AB (ref 200–950)

## 2017-01-03 MED ORDER — PREDNISONE 10 MG (21) PO TBPK: ORAL_TABLET | ORAL | 0 refills | Status: DC

## 2017-01-03 NOTE — Progress Notes (Signed)
[address]       [place] [address]             [phone]     HPI: Mr. Mortensen returns for a scheduled follow-up visit  Mr. Doreene Nest is a 66 year old man with a history of renal cell carcinoma with bilateral nephrectomies.  Most recently had a left nephrectomy in February 2017.  He has end-stage renal disease and is on peritoneal dialysis, hypertension, anemia, anxiety, arthritis, chronic back pain, and dermatitis.  He was first noted to have a right pleural effusion in April 2017.  He went about 6 months before requiring thoracentesis, and then required thoracentesis every 3-4 months after that.  I first saw him in October.  I do not have any data on the fluid.  I did a thoracentesis and the fluid was an exudate.  I saw him back in the office on 12/20/2016.  He had no significant reaccumulation of his effusion.  I treated him with a prednisone taper.  He now returns for follow-up.  His breathing is unchanged since his last visit.  He did not have any side effects from the prednisone.  He denies any fevers, chills, coughs, night sweats, or wheezing.  Past Medical History:  Diagnosis Date  . Anemia   . Anxiety   . Arthritis   . Cancer [facility])    bladder, ureter, Bil kidneys  . Dermatitis    scaly bump occurs every few months, pt uses cortizone cream and it goes away  . ESRD (end stage renal disease) on dialysis ([place])    Bilateral Nephrectomies- due to cancer  . History of blood transfusion   . Hypertension   . Lumbar back pain   . Mental disorder   . Pleural effusion, right    s/p right thoracentesis 10/07/15 (no malignancy by cytology report)  . Shortness of breath dyspnea    Lying down gets short of breath    Current Outpatient Medications  Medication Sig Dispense Refill  . ALPRAZolam (XANAX) 1 MG tablet Take 1 mg by mouth 2 (two) times daily as needed for anxiety.     . clobetasol cream (TEMOVATE) 2.83 % Apply 1 application topically 2 (two) times daily.      . fluticasone (FLONASE) 50 MCG/ACT nasal spray Place 1 spray into both nostrils daily.     Marland Kitchen oxyCODONE-acetaminophen (PERCOCET/ROXICET) 5-325 MG tablet 1 tablet every 4 (four) hours as needed.     [name] Glycol-Propyl Glycol (SYSTANE OP) Place 1 drop into both eyes daily as needed (for dry, itchy eyes).    . predniSONE (STERAPRED UNI-PAK 21 TAB) 10 MG (21) TBPK tablet Take 6 tabs on day 1, 5 tabs on day 2, 4 tabs on day 3, 3 tabs on day 4, 2 tabs on day 5 and 1 tab on day 6 21 tablet 0   No current facility-administered medications for this visit.     Physical Exam BP (!) 149/81   Pulse 70   Ht 5\' 10"  (1.778 m)   Wt 243 lb (110.2 kg)   SpO2 99%   BMI 34.82 kg/m  66 year old man in no acute distress Alert and oriented x3 with no focal deficits Cardiac regular rate and rhythm normal S1-S2 Lungs slightly diminished right base, otherwise clear  Diagnostic Tests: CHEST  2 VIEW  COMPARISON:  Chest x-ray of December 20, 2016.  FINDINGS: There remains a small right pleural effusion. There is patchy density in the right  infrahilar region which is also stable. There is new increased density in the left infrahilar region. There is no left pleural effusion. The heart is top-normal in size. The pulmonary vascularity is not engorged. There is multilevel degenerative disc disease of the thoracic spine.  IMPRESSION: Stable right pleural effusion. Right infrahilar atelectasis or infiltrate, stable. New subtle increased density in the left infrahilar region most compatible with subsegmental atelectasis or early pneumonia. Followup PA and lateral chest X-ray is recommended in 3-4 weeks following trial of antibiotic therapy to ensure resolution and exclude underlying malignancy.   Electronically Signed   By: David  Martinique M.D.   On: 01/03/2017 12:41 I personally reviewed the chest x-ray images and concur with the findings noted above.  There is been no change in the small  loculated right pleural effusion  Impression: Mr. Hersh is a 66 year old gentleman with multiple medical problems including end-stage renal disease and bilateral nephrectomies for renal cell carcinoma.  He has had problems with a recurrent right pleural effusion over the past year and a half it started after the left nephrectomy.  He says that it predated him initiating peritoneal dialysis.  I suspect this is a post pneumonic effusion with chronic inflammation.  He has had multiple negative cytologies.  The effusion has been unchanged over the past few weeks since he was drained about a month ago.  I do not think by any stretch imagination were out of the woods and possibility of it recurring.  I recommended that we repeat his prednisone taper.  Left lower lobe opacity noted on chest x-ray.  Possible pneumonia.  He does not have any clinical signs or symptoms to suggest pneumonia.  I would favor not giving empiric antibiotics in the absence of symptoms.  We will see what that looks like on repeat.  If still present in a month we will consider a CT.  Plan: Prednisone taper  Return in 1 month with PA and lateral chest x-ray.  Melrose Nakayama, MD [facility] [phone]

## 2017-01-04 ENCOUNTER — Other Ambulatory Visit: Payer: Medicare Other

## 2017-01-04 DIAGNOSIS — D509 Iron deficiency anemia, unspecified: Secondary | ICD-10-CM | POA: Diagnosis not present

## 2017-01-04 DIAGNOSIS — Z992 Dependence on renal dialysis: Secondary | ICD-10-CM | POA: Diagnosis not present

## 2017-01-04 DIAGNOSIS — D631 Anemia in chronic kidney disease: Secondary | ICD-10-CM | POA: Diagnosis not present

## 2017-01-04 DIAGNOSIS — N2581 Secondary hyperparathyroidism of renal origin: Secondary | ICD-10-CM | POA: Diagnosis not present

## 2017-01-04 DIAGNOSIS — N186 End stage renal disease: Secondary | ICD-10-CM | POA: Diagnosis not present

## 2017-01-05 DIAGNOSIS — D631 Anemia in chronic kidney disease: Secondary | ICD-10-CM | POA: Diagnosis not present

## 2017-01-05 DIAGNOSIS — N2581 Secondary hyperparathyroidism of renal origin: Secondary | ICD-10-CM | POA: Diagnosis not present

## 2017-01-05 DIAGNOSIS — Z992 Dependence on renal dialysis: Secondary | ICD-10-CM | POA: Diagnosis not present

## 2017-01-05 DIAGNOSIS — D509 Iron deficiency anemia, unspecified: Secondary | ICD-10-CM | POA: Diagnosis not present

## 2017-01-05 DIAGNOSIS — N186 End stage renal disease: Secondary | ICD-10-CM | POA: Diagnosis not present

## 2017-01-06 DIAGNOSIS — D509 Iron deficiency anemia, unspecified: Secondary | ICD-10-CM | POA: Diagnosis not present

## 2017-01-06 DIAGNOSIS — N2581 Secondary hyperparathyroidism of renal origin: Secondary | ICD-10-CM | POA: Diagnosis not present

## 2017-01-06 DIAGNOSIS — N186 End stage renal disease: Secondary | ICD-10-CM | POA: Diagnosis not present

## 2017-01-06 DIAGNOSIS — Z992 Dependence on renal dialysis: Secondary | ICD-10-CM | POA: Diagnosis not present

## 2017-01-06 DIAGNOSIS — D631 Anemia in chronic kidney disease: Secondary | ICD-10-CM | POA: Diagnosis not present

## 2017-01-07 DIAGNOSIS — N2581 Secondary hyperparathyroidism of renal origin: Secondary | ICD-10-CM | POA: Diagnosis not present

## 2017-01-07 DIAGNOSIS — D631 Anemia in chronic kidney disease: Secondary | ICD-10-CM | POA: Diagnosis not present

## 2017-01-07 DIAGNOSIS — N186 End stage renal disease: Secondary | ICD-10-CM | POA: Diagnosis not present

## 2017-01-07 DIAGNOSIS — D509 Iron deficiency anemia, unspecified: Secondary | ICD-10-CM | POA: Diagnosis not present

## 2017-01-07 DIAGNOSIS — Z992 Dependence on renal dialysis: Secondary | ICD-10-CM | POA: Diagnosis not present

## 2017-01-08 DIAGNOSIS — D509 Iron deficiency anemia, unspecified: Secondary | ICD-10-CM | POA: Diagnosis not present

## 2017-01-08 DIAGNOSIS — N2581 Secondary hyperparathyroidism of renal origin: Secondary | ICD-10-CM | POA: Diagnosis not present

## 2017-01-08 DIAGNOSIS — Z992 Dependence on renal dialysis: Secondary | ICD-10-CM | POA: Diagnosis not present

## 2017-01-08 DIAGNOSIS — N186 End stage renal disease: Secondary | ICD-10-CM | POA: Diagnosis not present

## 2017-01-08 DIAGNOSIS — D631 Anemia in chronic kidney disease: Secondary | ICD-10-CM | POA: Diagnosis not present

## 2017-01-09 DIAGNOSIS — N186 End stage renal disease: Secondary | ICD-10-CM | POA: Diagnosis not present

## 2017-01-09 DIAGNOSIS — N2581 Secondary hyperparathyroidism of renal origin: Secondary | ICD-10-CM | POA: Diagnosis not present

## 2017-01-09 DIAGNOSIS — D631 Anemia in chronic kidney disease: Secondary | ICD-10-CM | POA: Diagnosis not present

## 2017-01-09 DIAGNOSIS — Z992 Dependence on renal dialysis: Secondary | ICD-10-CM | POA: Diagnosis not present

## 2017-01-09 DIAGNOSIS — D509 Iron deficiency anemia, unspecified: Secondary | ICD-10-CM | POA: Diagnosis not present

## 2017-01-10 DIAGNOSIS — N186 End stage renal disease: Secondary | ICD-10-CM | POA: Diagnosis not present

## 2017-01-10 DIAGNOSIS — N2581 Secondary hyperparathyroidism of renal origin: Secondary | ICD-10-CM | POA: Diagnosis not present

## 2017-01-10 DIAGNOSIS — D509 Iron deficiency anemia, unspecified: Secondary | ICD-10-CM | POA: Diagnosis not present

## 2017-01-10 DIAGNOSIS — D631 Anemia in chronic kidney disease: Secondary | ICD-10-CM | POA: Diagnosis not present

## 2017-01-10 DIAGNOSIS — Z992 Dependence on renal dialysis: Secondary | ICD-10-CM | POA: Diagnosis not present

## 2017-01-11 ENCOUNTER — Ambulatory Visit
Admission: RE | Admit: 2017-01-11 | Discharge: 2017-01-11 | Disposition: A | Discharge status: Home / Self Care | Payer: Medicare Other | Source: Ambulatory Visit | Attending: Neurosurgery | Admitting: Neurosurgery

## 2017-01-11 ENCOUNTER — Other Ambulatory Visit: Payer: Self-pay | Admitting: Neurosurgery

## 2017-01-11 DIAGNOSIS — M47812 Spondylosis without myelopathy or radiculopathy, cervical region: Secondary | ICD-10-CM

## 2017-01-11 DIAGNOSIS — Z992 Dependence on renal dialysis: Secondary | ICD-10-CM | POA: Diagnosis not present

## 2017-01-11 DIAGNOSIS — N2581 Secondary hyperparathyroidism of renal origin: Secondary | ICD-10-CM | POA: Diagnosis not present

## 2017-01-11 DIAGNOSIS — D631 Anemia in chronic kidney disease: Secondary | ICD-10-CM | POA: Diagnosis not present

## 2017-01-11 DIAGNOSIS — N186 End stage renal disease: Secondary | ICD-10-CM | POA: Diagnosis not present

## 2017-01-11 DIAGNOSIS — D509 Iron deficiency anemia, unspecified: Secondary | ICD-10-CM | POA: Diagnosis not present

## 2017-01-11 HISTORY — DX: Gastro-esophageal reflux disease without esophagitis: K21.9

## 2017-01-11 MED ORDER — DEXAMETHASONE SODIUM PHOSPHATE 4 MG/ML IJ SOLN
3.0000 mg | Freq: Once | INTRAMUSCULAR | Status: AC
  Administered 2017-01-11: 6 mg

## 2017-01-11 NOTE — Discharge Instructions (Signed)

## 2017-01-12 ENCOUNTER — Telehealth (HOSPITAL_COMMUNITY): Payer: Self-pay

## 2017-01-12 ENCOUNTER — Other Ambulatory Visit (HOSPITAL_COMMUNITY): Payer: Self-pay

## 2017-01-12 ENCOUNTER — Encounter (HOSPITAL_COMMUNITY): Payer: Medicare Other | Attending: Oncology

## 2017-01-12 ENCOUNTER — Telehealth: Payer: Self-pay | Admitting: Family Medicine

## 2017-01-12 DIAGNOSIS — D509 Iron deficiency anemia, unspecified: Secondary | ICD-10-CM | POA: Diagnosis not present

## 2017-01-12 DIAGNOSIS — D649 Anemia, unspecified: Secondary | ICD-10-CM

## 2017-01-12 DIAGNOSIS — Z992 Dependence on renal dialysis: Secondary | ICD-10-CM | POA: Diagnosis not present

## 2017-01-12 DIAGNOSIS — N2581 Secondary hyperparathyroidism of renal origin: Secondary | ICD-10-CM | POA: Diagnosis not present

## 2017-01-12 DIAGNOSIS — D631 Anemia in chronic kidney disease: Secondary | ICD-10-CM | POA: Diagnosis not present

## 2017-01-12 DIAGNOSIS — N186 End stage renal disease: Secondary | ICD-10-CM | POA: Diagnosis not present

## 2017-01-12 LAB — CBC
HCT: 25.6 % — ABNORMAL LOW (ref 39.0–52.0)
HEMOGLOBIN: 9.1 g/dL — AB (ref 13.0–17.0)
MCH: 36.3 pg — ABNORMAL HIGH (ref 26.0–34.0)
MCHC: 35.5 g/dL (ref 30.0–36.0)
MCV: 102 fL — ABNORMAL HIGH (ref 78.0–100.0)
PLATELETS: 248 10*3/uL (ref 150–400)
RBC: 2.51 MIL/uL — ABNORMAL LOW (ref 4.22–5.81)
RDW: 15.2 % (ref 11.5–15.5)
WBC: 14.1 10*3/uL — ABNORMAL HIGH (ref 4.0–10.5)

## 2017-01-12 LAB — TYPE AND SCREEN
ABO/RH(D): B POS
ANTIBODY SCREEN: NEGATIVE

## 2017-01-12 NOTE — Telephone Encounter (Signed)
Patient states he is feeling very tired. Patient is agreeable to transfusion. Patient states there is a discrepancy between labs we draw and labs drawn at dialysis. He states dialysis is seeing hgb of over 9

## 2017-01-12 NOTE — Telephone Encounter (Signed)
Patient came by office and asked for alprazolam to be refilled- was getting from Dr. Wenda Overland and then Dr. Scotty Court. I explained to him that Dr. Mannie Stabile does not write controlled anxiety medications therefore, she would not refill it. He states he has been on the medication for 9 years and it has never been a problem. I explained that it was because he was not seeing Dr. Mannie Stabile then. I told him I could send a message asking for a referral to behavioral health to see if they can manage it, or he could schedule an appointment to discuss non-controlled alternatives. I assured him that this was our policy, and we are not singling him out. He asks for a referral to behavioral health. He states he does not understand why Dr. Mannie Stabile could write a depression medication that 'almost killed me' but can't write the alprazolam.

## 2017-01-12 NOTE — Telephone Encounter (Signed)
Please call [name] and tell him that his hemoglobin is now 8.2.  If he is feeling tired and is symptomatic at this time with his anemia I will send a message to Dr.Zhou to organize his transfusion.  Please find out how he is feeling so that we can get this set up before the holidays.

## 2017-01-12 NOTE — Telephone Encounter (Signed)
Call patient and ask how much he takes and the frequency of the medication. Gwen Her. Mannie Stabile, MD

## 2017-01-12 NOTE — Telephone Encounter (Signed)
Patient had lab work performed at [facility] and Hgb is 9.1 today, therefore he will not need a blood transfusion per Dr. Talbert Cage. Copy of labs faxed to Dr. Mannie Stabile.

## 2017-01-12 NOTE — Telephone Encounter (Signed)
Please call Dr. Laverle Patter office and speak with nurse. Per review of Dr. Laverle Patter note they would organize transfusion of patient hemoglobin less than 8.5 and symptomatic. Advise them his hemoglobin is 8.3 and he is fatigued.

## 2017-01-12 NOTE — Telephone Encounter (Signed)
Patients PCP office, Dr. Mannie Stabile, called stating the last time they checked his labs his hgb was 8.2 and he was symptomatic. Called and verified with patient. He states he is fatigued and tired but is always that way. He does want to come in for blood if possible, however. Lab appt made and lab orders entered for today. Patient is aware and verbalized understanding.

## 2017-01-12 NOTE — Telephone Encounter (Signed)
Spoke to Dr. Laverle Patter nurse. Patient will need labs redrawn, they do not accept [facility] labs. But they will contact patient.

## 2017-01-12 NOTE — Telephone Encounter (Signed)
Called patient regarding message below. No answer, unable to leave message.  

## 2017-01-13 DIAGNOSIS — N2581 Secondary hyperparathyroidism of renal origin: Secondary | ICD-10-CM | POA: Diagnosis not present

## 2017-01-13 DIAGNOSIS — N186 End stage renal disease: Secondary | ICD-10-CM | POA: Diagnosis not present

## 2017-01-13 DIAGNOSIS — D509 Iron deficiency anemia, unspecified: Secondary | ICD-10-CM | POA: Diagnosis not present

## 2017-01-13 DIAGNOSIS — Z992 Dependence on renal dialysis: Secondary | ICD-10-CM | POA: Diagnosis not present

## 2017-01-13 DIAGNOSIS — D631 Anemia in chronic kidney disease: Secondary | ICD-10-CM | POA: Diagnosis not present

## 2017-01-13 NOTE — Telephone Encounter (Signed)
Called patient regarding message below. No answer, unable to leave message.  

## 2017-01-14 DIAGNOSIS — D509 Iron deficiency anemia, unspecified: Secondary | ICD-10-CM | POA: Diagnosis not present

## 2017-01-14 DIAGNOSIS — Z992 Dependence on renal dialysis: Secondary | ICD-10-CM | POA: Diagnosis not present

## 2017-01-14 DIAGNOSIS — N186 End stage renal disease: Secondary | ICD-10-CM | POA: Diagnosis not present

## 2017-01-14 DIAGNOSIS — N2581 Secondary hyperparathyroidism of renal origin: Secondary | ICD-10-CM | POA: Diagnosis not present

## 2017-01-14 DIAGNOSIS — D631 Anemia in chronic kidney disease: Secondary | ICD-10-CM | POA: Diagnosis not present

## 2017-01-15 DIAGNOSIS — Z992 Dependence on renal dialysis: Secondary | ICD-10-CM | POA: Diagnosis not present

## 2017-01-15 DIAGNOSIS — D509 Iron deficiency anemia, unspecified: Secondary | ICD-10-CM | POA: Diagnosis not present

## 2017-01-15 DIAGNOSIS — N2581 Secondary hyperparathyroidism of renal origin: Secondary | ICD-10-CM | POA: Diagnosis not present

## 2017-01-15 DIAGNOSIS — D631 Anemia in chronic kidney disease: Secondary | ICD-10-CM | POA: Diagnosis not present

## 2017-01-15 DIAGNOSIS — N186 End stage renal disease: Secondary | ICD-10-CM | POA: Diagnosis not present

## 2017-01-16 DIAGNOSIS — D509 Iron deficiency anemia, unspecified: Secondary | ICD-10-CM | POA: Diagnosis not present

## 2017-01-16 DIAGNOSIS — Z992 Dependence on renal dialysis: Secondary | ICD-10-CM | POA: Diagnosis not present

## 2017-01-16 DIAGNOSIS — N2581 Secondary hyperparathyroidism of renal origin: Secondary | ICD-10-CM | POA: Diagnosis not present

## 2017-01-16 DIAGNOSIS — D631 Anemia in chronic kidney disease: Secondary | ICD-10-CM | POA: Diagnosis not present

## 2017-01-16 DIAGNOSIS — N186 End stage renal disease: Secondary | ICD-10-CM | POA: Diagnosis not present

## 2017-01-17 DIAGNOSIS — N186 End stage renal disease: Secondary | ICD-10-CM | POA: Diagnosis not present

## 2017-01-17 DIAGNOSIS — D509 Iron deficiency anemia, unspecified: Secondary | ICD-10-CM | POA: Diagnosis not present

## 2017-01-17 DIAGNOSIS — Z992 Dependence on renal dialysis: Secondary | ICD-10-CM | POA: Diagnosis not present

## 2017-01-17 DIAGNOSIS — D631 Anemia in chronic kidney disease: Secondary | ICD-10-CM | POA: Diagnosis not present

## 2017-01-17 DIAGNOSIS — N2581 Secondary hyperparathyroidism of renal origin: Secondary | ICD-10-CM | POA: Diagnosis not present

## 2017-01-18 DIAGNOSIS — N186 End stage renal disease: Secondary | ICD-10-CM | POA: Diagnosis not present

## 2017-01-18 DIAGNOSIS — D509 Iron deficiency anemia, unspecified: Secondary | ICD-10-CM | POA: Diagnosis not present

## 2017-01-18 DIAGNOSIS — Z992 Dependence on renal dialysis: Secondary | ICD-10-CM | POA: Diagnosis not present

## 2017-01-18 DIAGNOSIS — N2581 Secondary hyperparathyroidism of renal origin: Secondary | ICD-10-CM | POA: Diagnosis not present

## 2017-01-18 DIAGNOSIS — D631 Anemia in chronic kidney disease: Secondary | ICD-10-CM | POA: Diagnosis not present

## 2017-01-19 DIAGNOSIS — Z992 Dependence on renal dialysis: Secondary | ICD-10-CM | POA: Diagnosis not present

## 2017-01-19 DIAGNOSIS — D509 Iron deficiency anemia, unspecified: Secondary | ICD-10-CM | POA: Diagnosis not present

## 2017-01-19 DIAGNOSIS — D631 Anemia in chronic kidney disease: Secondary | ICD-10-CM | POA: Diagnosis not present

## 2017-01-19 DIAGNOSIS — N2581 Secondary hyperparathyroidism of renal origin: Secondary | ICD-10-CM | POA: Diagnosis not present

## 2017-01-19 DIAGNOSIS — N186 End stage renal disease: Secondary | ICD-10-CM | POA: Diagnosis not present

## 2017-01-20 DIAGNOSIS — N2581 Secondary hyperparathyroidism of renal origin: Secondary | ICD-10-CM | POA: Diagnosis not present

## 2017-01-20 DIAGNOSIS — N186 End stage renal disease: Secondary | ICD-10-CM | POA: Diagnosis not present

## 2017-01-20 DIAGNOSIS — D509 Iron deficiency anemia, unspecified: Secondary | ICD-10-CM | POA: Diagnosis not present

## 2017-01-20 DIAGNOSIS — Z992 Dependence on renal dialysis: Secondary | ICD-10-CM | POA: Diagnosis not present

## 2017-01-20 DIAGNOSIS — D631 Anemia in chronic kidney disease: Secondary | ICD-10-CM | POA: Diagnosis not present

## 2017-01-21 DIAGNOSIS — N2581 Secondary hyperparathyroidism of renal origin: Secondary | ICD-10-CM | POA: Diagnosis not present

## 2017-01-21 DIAGNOSIS — N186 End stage renal disease: Secondary | ICD-10-CM | POA: Diagnosis not present

## 2017-01-21 DIAGNOSIS — D631 Anemia in chronic kidney disease: Secondary | ICD-10-CM | POA: Diagnosis not present

## 2017-01-21 DIAGNOSIS — D509 Iron deficiency anemia, unspecified: Secondary | ICD-10-CM | POA: Diagnosis not present

## 2017-01-21 DIAGNOSIS — Z992 Dependence on renal dialysis: Secondary | ICD-10-CM | POA: Diagnosis not present

## 2017-01-22 DIAGNOSIS — N2581 Secondary hyperparathyroidism of renal origin: Secondary | ICD-10-CM | POA: Diagnosis not present

## 2017-01-22 DIAGNOSIS — Z992 Dependence on renal dialysis: Secondary | ICD-10-CM | POA: Diagnosis not present

## 2017-01-22 DIAGNOSIS — D631 Anemia in chronic kidney disease: Secondary | ICD-10-CM | POA: Diagnosis not present

## 2017-01-22 DIAGNOSIS — N186 End stage renal disease: Secondary | ICD-10-CM | POA: Diagnosis not present

## 2017-01-22 DIAGNOSIS — D509 Iron deficiency anemia, unspecified: Secondary | ICD-10-CM | POA: Diagnosis not present

## 2017-01-23 DIAGNOSIS — D509 Iron deficiency anemia, unspecified: Secondary | ICD-10-CM | POA: Diagnosis not present

## 2017-01-23 DIAGNOSIS — D631 Anemia in chronic kidney disease: Secondary | ICD-10-CM | POA: Diagnosis not present

## 2017-01-23 DIAGNOSIS — N2581 Secondary hyperparathyroidism of renal origin: Secondary | ICD-10-CM | POA: Diagnosis not present

## 2017-01-23 DIAGNOSIS — N186 End stage renal disease: Secondary | ICD-10-CM | POA: Diagnosis not present

## 2017-01-23 DIAGNOSIS — Z992 Dependence on renal dialysis: Secondary | ICD-10-CM | POA: Diagnosis not present

## 2017-01-24 DIAGNOSIS — Z992 Dependence on renal dialysis: Secondary | ICD-10-CM | POA: Diagnosis not present

## 2017-01-24 DIAGNOSIS — N186 End stage renal disease: Secondary | ICD-10-CM | POA: Diagnosis not present

## 2017-01-24 DIAGNOSIS — D509 Iron deficiency anemia, unspecified: Secondary | ICD-10-CM | POA: Diagnosis not present

## 2017-01-24 DIAGNOSIS — N2581 Secondary hyperparathyroidism of renal origin: Secondary | ICD-10-CM | POA: Diagnosis not present

## 2017-01-24 DIAGNOSIS — D631 Anemia in chronic kidney disease: Secondary | ICD-10-CM | POA: Diagnosis not present

## 2017-01-25 DIAGNOSIS — D631 Anemia in chronic kidney disease: Secondary | ICD-10-CM | POA: Diagnosis not present

## 2017-01-25 DIAGNOSIS — N2581 Secondary hyperparathyroidism of renal origin: Secondary | ICD-10-CM | POA: Diagnosis not present

## 2017-01-25 DIAGNOSIS — Z992 Dependence on renal dialysis: Secondary | ICD-10-CM | POA: Diagnosis not present

## 2017-01-25 DIAGNOSIS — D509 Iron deficiency anemia, unspecified: Secondary | ICD-10-CM | POA: Diagnosis not present

## 2017-01-25 DIAGNOSIS — N186 End stage renal disease: Secondary | ICD-10-CM | POA: Diagnosis not present

## 2017-01-26 DIAGNOSIS — N2581 Secondary hyperparathyroidism of renal origin: Secondary | ICD-10-CM | POA: Diagnosis not present

## 2017-01-26 DIAGNOSIS — D631 Anemia in chronic kidney disease: Secondary | ICD-10-CM | POA: Diagnosis not present

## 2017-01-26 DIAGNOSIS — N186 End stage renal disease: Secondary | ICD-10-CM | POA: Diagnosis not present

## 2017-01-26 DIAGNOSIS — D509 Iron deficiency anemia, unspecified: Secondary | ICD-10-CM | POA: Diagnosis not present

## 2017-01-26 DIAGNOSIS — Z992 Dependence on renal dialysis: Secondary | ICD-10-CM | POA: Diagnosis not present

## 2017-01-27 DIAGNOSIS — D631 Anemia in chronic kidney disease: Secondary | ICD-10-CM | POA: Diagnosis not present

## 2017-01-27 DIAGNOSIS — Z992 Dependence on renal dialysis: Secondary | ICD-10-CM | POA: Diagnosis not present

## 2017-01-27 DIAGNOSIS — D509 Iron deficiency anemia, unspecified: Secondary | ICD-10-CM | POA: Diagnosis not present

## 2017-01-27 DIAGNOSIS — N186 End stage renal disease: Secondary | ICD-10-CM | POA: Diagnosis not present

## 2017-01-27 DIAGNOSIS — N2581 Secondary hyperparathyroidism of renal origin: Secondary | ICD-10-CM | POA: Diagnosis not present

## 2017-01-28 DIAGNOSIS — Z992 Dependence on renal dialysis: Secondary | ICD-10-CM | POA: Diagnosis not present

## 2017-01-28 DIAGNOSIS — N2581 Secondary hyperparathyroidism of renal origin: Secondary | ICD-10-CM | POA: Diagnosis not present

## 2017-01-28 DIAGNOSIS — D509 Iron deficiency anemia, unspecified: Secondary | ICD-10-CM | POA: Diagnosis not present

## 2017-01-28 DIAGNOSIS — N186 End stage renal disease: Secondary | ICD-10-CM | POA: Diagnosis not present

## 2017-01-28 DIAGNOSIS — D631 Anemia in chronic kidney disease: Secondary | ICD-10-CM | POA: Diagnosis not present

## 2017-01-29 DIAGNOSIS — N2581 Secondary hyperparathyroidism of renal origin: Secondary | ICD-10-CM | POA: Diagnosis not present

## 2017-01-29 DIAGNOSIS — Z992 Dependence on renal dialysis: Secondary | ICD-10-CM | POA: Diagnosis not present

## 2017-01-29 DIAGNOSIS — D631 Anemia in chronic kidney disease: Secondary | ICD-10-CM | POA: Diagnosis not present

## 2017-01-29 DIAGNOSIS — D509 Iron deficiency anemia, unspecified: Secondary | ICD-10-CM | POA: Diagnosis not present

## 2017-01-29 DIAGNOSIS — N186 End stage renal disease: Secondary | ICD-10-CM | POA: Diagnosis not present

## 2017-01-30 DIAGNOSIS — Z992 Dependence on renal dialysis: Secondary | ICD-10-CM | POA: Diagnosis not present

## 2017-01-30 DIAGNOSIS — N186 End stage renal disease: Secondary | ICD-10-CM | POA: Diagnosis not present

## 2017-01-30 DIAGNOSIS — D509 Iron deficiency anemia, unspecified: Secondary | ICD-10-CM | POA: Diagnosis not present

## 2017-01-30 DIAGNOSIS — N2581 Secondary hyperparathyroidism of renal origin: Secondary | ICD-10-CM | POA: Diagnosis not present

## 2017-01-30 DIAGNOSIS — D631 Anemia in chronic kidney disease: Secondary | ICD-10-CM | POA: Diagnosis not present

## 2017-01-31 DIAGNOSIS — D631 Anemia in chronic kidney disease: Secondary | ICD-10-CM | POA: Diagnosis not present

## 2017-01-31 DIAGNOSIS — D509 Iron deficiency anemia, unspecified: Secondary | ICD-10-CM | POA: Diagnosis not present

## 2017-01-31 DIAGNOSIS — Z992 Dependence on renal dialysis: Secondary | ICD-10-CM | POA: Diagnosis not present

## 2017-01-31 DIAGNOSIS — N186 End stage renal disease: Secondary | ICD-10-CM | POA: Diagnosis not present

## 2017-01-31 DIAGNOSIS — N2581 Secondary hyperparathyroidism of renal origin: Secondary | ICD-10-CM | POA: Diagnosis not present

## 2017-02-01 DIAGNOSIS — D631 Anemia in chronic kidney disease: Secondary | ICD-10-CM | POA: Diagnosis not present

## 2017-02-01 DIAGNOSIS — D509 Iron deficiency anemia, unspecified: Secondary | ICD-10-CM | POA: Diagnosis not present

## 2017-02-01 DIAGNOSIS — N186 End stage renal disease: Secondary | ICD-10-CM | POA: Diagnosis not present

## 2017-02-01 DIAGNOSIS — Z992 Dependence on renal dialysis: Secondary | ICD-10-CM | POA: Diagnosis not present

## 2017-02-01 DIAGNOSIS — N2581 Secondary hyperparathyroidism of renal origin: Secondary | ICD-10-CM | POA: Diagnosis not present

## 2017-02-02 ENCOUNTER — Telehealth: Payer: Self-pay | Admitting: Family Medicine

## 2017-02-02 DIAGNOSIS — N186 End stage renal disease: Secondary | ICD-10-CM | POA: Diagnosis not present

## 2017-02-02 DIAGNOSIS — Z992 Dependence on renal dialysis: Secondary | ICD-10-CM | POA: Diagnosis not present

## 2017-02-02 DIAGNOSIS — D509 Iron deficiency anemia, unspecified: Secondary | ICD-10-CM | POA: Diagnosis not present

## 2017-02-02 DIAGNOSIS — D631 Anemia in chronic kidney disease: Secondary | ICD-10-CM | POA: Diagnosis not present

## 2017-02-02 DIAGNOSIS — F419 Anxiety disorder, unspecified: Secondary | ICD-10-CM

## 2017-02-02 DIAGNOSIS — N2581 Secondary hyperparathyroidism of renal origin: Secondary | ICD-10-CM | POA: Diagnosis not present

## 2017-02-02 NOTE — Telephone Encounter (Signed)
Patient wants a referral to psych because he can not get his xanax here.

## 2017-02-02 NOTE — Telephone Encounter (Signed)
Is he calling about a referral? Please let me know what he needs. Gwen Her. Mannie Stabile, MD

## 2017-02-02 NOTE — Telephone Encounter (Signed)
Patient called in to check the status of his behavorial health referral. I can not see the referral so I emailed [email or web address] as advised by administrator in our last meeting. I have also asked Bubba Hales to look into this. I will call patient with any updates  Cb#: [phone] He is upset and feels like he was lost in the system and not being taken care of. I assured him that we will do what we can to fix this.

## 2017-02-02 NOTE — Telephone Encounter (Signed)
Can you see anything about a behavioral health referral for this patient ? I have emailed [email or web address] to check the status of this but I cant see when it was originally done.

## 2017-02-02 NOTE — Telephone Encounter (Signed)
Patient is aware of : 03-14-2016 at 12:30pm in [place] with Dr. Modesta Messing

## 2017-02-03 DIAGNOSIS — Z992 Dependence on renal dialysis: Secondary | ICD-10-CM | POA: Diagnosis not present

## 2017-02-03 DIAGNOSIS — D509 Iron deficiency anemia, unspecified: Secondary | ICD-10-CM | POA: Diagnosis not present

## 2017-02-03 DIAGNOSIS — D631 Anemia in chronic kidney disease: Secondary | ICD-10-CM | POA: Diagnosis not present

## 2017-02-03 DIAGNOSIS — N2581 Secondary hyperparathyroidism of renal origin: Secondary | ICD-10-CM | POA: Diagnosis not present

## 2017-02-03 DIAGNOSIS — N186 End stage renal disease: Secondary | ICD-10-CM | POA: Diagnosis not present

## 2017-02-03 NOTE — Telephone Encounter (Signed)
Please advise that referral was placed. Gwen Her. Mannie Stabile, MD

## 2017-02-03 NOTE — Addendum Note (Signed)
Addended by: Caren Macadam on: 02/03/2017 08:32 AM   Modules accepted: Orders

## 2017-02-03 NOTE — Telephone Encounter (Signed)
Called patient regarding message below. No answer, unable to leave message.  

## 2017-02-04 DIAGNOSIS — N2581 Secondary hyperparathyroidism of renal origin: Secondary | ICD-10-CM | POA: Diagnosis not present

## 2017-02-04 DIAGNOSIS — D509 Iron deficiency anemia, unspecified: Secondary | ICD-10-CM | POA: Diagnosis not present

## 2017-02-04 DIAGNOSIS — D631 Anemia in chronic kidney disease: Secondary | ICD-10-CM | POA: Diagnosis not present

## 2017-02-04 DIAGNOSIS — N186 End stage renal disease: Secondary | ICD-10-CM | POA: Diagnosis not present

## 2017-02-04 DIAGNOSIS — Z992 Dependence on renal dialysis: Secondary | ICD-10-CM | POA: Diagnosis not present

## 2017-02-05 DIAGNOSIS — D509 Iron deficiency anemia, unspecified: Secondary | ICD-10-CM | POA: Diagnosis not present

## 2017-02-05 DIAGNOSIS — N186 End stage renal disease: Secondary | ICD-10-CM | POA: Diagnosis not present

## 2017-02-05 DIAGNOSIS — N2581 Secondary hyperparathyroidism of renal origin: Secondary | ICD-10-CM | POA: Diagnosis not present

## 2017-02-05 DIAGNOSIS — D631 Anemia in chronic kidney disease: Secondary | ICD-10-CM | POA: Diagnosis not present

## 2017-02-05 DIAGNOSIS — Z992 Dependence on renal dialysis: Secondary | ICD-10-CM | POA: Diagnosis not present

## 2017-02-06 ENCOUNTER — Other Ambulatory Visit: Payer: Self-pay | Admitting: Thoracic Surgery (Cardiothoracic Vascular Surgery)

## 2017-02-06 DIAGNOSIS — J90 Pleural effusion, not elsewhere classified: Secondary | ICD-10-CM

## 2017-02-06 DIAGNOSIS — D631 Anemia in chronic kidney disease: Secondary | ICD-10-CM | POA: Diagnosis not present

## 2017-02-06 DIAGNOSIS — Z992 Dependence on renal dialysis: Secondary | ICD-10-CM | POA: Diagnosis not present

## 2017-02-06 DIAGNOSIS — D509 Iron deficiency anemia, unspecified: Secondary | ICD-10-CM | POA: Diagnosis not present

## 2017-02-06 DIAGNOSIS — N186 End stage renal disease: Secondary | ICD-10-CM | POA: Diagnosis not present

## 2017-02-06 DIAGNOSIS — N2581 Secondary hyperparathyroidism of renal origin: Secondary | ICD-10-CM | POA: Diagnosis not present

## 2017-02-07 ENCOUNTER — Encounter: Payer: Self-pay | Admitting: Thoracic Surgery (Cardiothoracic Vascular Surgery)

## 2017-02-07 ENCOUNTER — Ambulatory Visit (INDEPENDENT_AMBULATORY_CARE_PROVIDER_SITE_OTHER): Payer: Medicare Other | Admitting: Thoracic Surgery (Cardiothoracic Vascular Surgery)

## 2017-02-07 ENCOUNTER — Other Ambulatory Visit: Payer: Self-pay

## 2017-02-07 ENCOUNTER — Encounter (HOSPITAL_COMMUNITY): Payer: Self-pay | Admitting: Oncology

## 2017-02-07 ENCOUNTER — Encounter (HOSPITAL_COMMUNITY): Payer: Medicare Other | Attending: Oncology | Admitting: Oncology

## 2017-02-07 ENCOUNTER — Ambulatory Visit
Admission: RE | Admit: 2017-02-07 | Discharge: 2017-02-07 | Disposition: A | Discharge status: Home / Self Care | Payer: Medicare Other | Source: Ambulatory Visit | Attending: Thoracic Surgery (Cardiothoracic Vascular Surgery) | Admitting: Thoracic Surgery (Cardiothoracic Vascular Surgery)

## 2017-02-07 VITALS — BP 151/72 | HR 78 | Temp 98.1°F | Resp 18 | Ht 70.0 in | Wt 255.0 lb

## 2017-02-07 VITALS — BP 148/79 | HR 68 | Ht 70.0 in | Wt 255.0 lb

## 2017-02-07 DIAGNOSIS — L080 Pyoderma: Secondary | ICD-10-CM | POA: Diagnosis not present

## 2017-02-07 DIAGNOSIS — J90 Pleural effusion, not elsewhere classified: Secondary | ICD-10-CM

## 2017-02-07 DIAGNOSIS — R918 Other nonspecific abnormal finding of lung field: Secondary | ICD-10-CM | POA: Diagnosis not present

## 2017-02-07 DIAGNOSIS — C641 Malignant neoplasm of right kidney, except renal pelvis: Secondary | ICD-10-CM | POA: Diagnosis not present

## 2017-02-07 DIAGNOSIS — Z992 Dependence on renal dialysis: Secondary | ICD-10-CM | POA: Diagnosis not present

## 2017-02-07 DIAGNOSIS — D509 Iron deficiency anemia, unspecified: Secondary | ICD-10-CM | POA: Diagnosis not present

## 2017-02-07 DIAGNOSIS — D631 Anemia in chronic kidney disease: Secondary | ICD-10-CM

## 2017-02-07 DIAGNOSIS — N2581 Secondary hyperparathyroidism of renal origin: Secondary | ICD-10-CM | POA: Diagnosis not present

## 2017-02-07 DIAGNOSIS — L299 Pruritus, unspecified: Secondary | ICD-10-CM | POA: Diagnosis not present

## 2017-02-07 DIAGNOSIS — N186 End stage renal disease: Secondary | ICD-10-CM

## 2017-02-07 DIAGNOSIS — L089 Local infection of the skin and subcutaneous tissue, unspecified: Secondary | ICD-10-CM | POA: Diagnosis not present

## 2017-02-07 DIAGNOSIS — C649 Malignant neoplasm of unspecified kidney, except renal pelvis: Secondary | ICD-10-CM

## 2017-02-07 MED ORDER — ALPRAZOLAM 1 MG PO TABS: 1.0000 mg | ORAL_TABLET | Freq: Two times a day (BID) | ORAL | 0 refills | Status: DC | PRN

## 2017-02-07 NOTE — Progress Notes (Signed)
[facility] Initial Visit:  Patient Care Team: Caren Macadam, MD as PCP - General (Family Medicine) Fran Lowes, MD as Referring Physician (Nephrology) [place]:  Stage III right renal cell carcinoma  HISTORY OF PRESENTING ILLNESS: Jesus [name] 66 y.o. male is here for evaluation of his stage III (T3N0) renal cell carcinoma. He was previously following at [facility] but wanted to transfer his care closer to home since he lives in [place].  He recently saw Dr. Alen Blew in oncology at [facility] in [place] on 10/25/16, however wanted to be even closer to home.  Per Dr. Hazeline Junker note:  "[name] is a 66 year old gentleman currently of [place], [place] where he lived the majority of his life. He is a gentleman without any significant comorbid conditions I was diagnosed with urothelial carcinoma in 2014. At that time he presented with hematuria and a CT scan showed a 3.8 x 6.3 x 6.0 cm right renal mass. He underwent a right nephrectomy, ureterectomy and retroperitoneal lymph node dissection done by Dr. Brendia Sacks in July 2014. At that time. 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 He subsequently received chemotherapy with cisplatin and gemcitabine for 4 cycles concluded in November 2015 under the care of Dr. Thera Flake at [facility]. He is subsequently developed to the left ureter tumors in April 2016 and required ablation. He hadn't recurrent tumor in October 2016 and required repeat ablation of these tumors and the ureter. 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 History of recurrent disease and subsequently underwent complete resection of his left kidney, ureter and bladder in February 2017. He is pathology showed a T3a urothelial carcinoma and the ureter and bladder  was 0 out of 9 lymph nodes involved. The procedure left him on dialysis and currently receiving peritoneal dialysis at home.  He started developing a recurrent pleural effusion in the last last year or so without any clear etiology. Imaging studies did not show any clear-cut malignancy and the lung although the pleural effusion might have obscured some of the lung parenchyma. His last thoracentesis was done on 10/06/2016 and prior to that it was in June 2018. His last CT scan was done in June 2018 which showed a focal consolidation in the right middle lobe adjacent to the pericardium measuring 3.2 x 2.3 cm. There is also increased right sided pleural effusion.  INTERVAL HISTORY: Patient presents today for continued follow-up and review of his restaging scans.  He complains of fatigue.  He denies any shortness of breath.  He has been seeing Dr. Roxan Hockey for his recurrent right pleural effusion and he had a thoracentesis performed in September and has not had one since then.  He states that he no longer has orthopnea like he did when he was first diagnosed with a pleural effusions.  He denies any chest pain, abdominal pain, nausea, diarrhea, vomiting.  He gets occasional constipation.  Appetite is good and energy level stable.   Review of Systems - Oncology ROS as per HPI otherwise 12 point ROS is negative.  MEDICAL HISTORY: Past Medical History:  Diagnosis Date  . Anemia   . Anxiety   . Arthritis   . Cancer [facility])    bladder, ureter, Bil kidneys  . Dermatitis    scaly bump occurs every few months, pt uses cortizone cream and it  goes away  . ESRD (end stage renal disease) on dialysis ([place])    Bilateral Nephrectomies- due to cancer  . GERD (gastroesophageal reflux disease) 04/07/2015  . History of blood transfusion   . Hypertension   . Lumbar back pain   . Mental disorder   . Pleural effusion, right    s/p right thoracentesis 10/07/15 (no malignancy by cytology report)  . Shortness of  breath dyspnea    Lying down gets short of breath    SURGICAL HISTORY: Past Surgical History:  Procedure Laterality Date  . AV FISTULA PLACEMENT Right 06/05/2015   Procedure: ARTERIOVENOUS (AV) FISTULA CREATION RIGHT ARM;  Surgeon: Angelia Mould, MD;  Location: [place];  Service: Vascular;  Laterality: Right;  . BLADDER SURGERY  04-06-2015   removal of bladder/ [facility]   . CAPD INSERTION N/A 06/24/2016   Procedure: LAPAROSCOPIC INSERTION CONTINUOUS AMBULATORY PERITONEAL DIALYSIS  (CAPD) CATHETER;  Surgeon: Clovis Riley, MD;  Location: [place];  Service: General;  Laterality: N/A;  . CARPAL TUNNEL RELEASE     left hand x 2; right hand x 1; right elbow  . CERVICAL FUSION     x 2  . EYE SURGERY Bilateral    Cataract removal  . FISTULA SUPERFICIALIZATION Right 10/30/2015   Procedure: SUPERFICIALIZATION BRACHIOCEPHALIC ARTERIOVENOUS FISTULA Right Arm;  Surgeon: Angelia Mould, MD;  Location: [place];  Service: Vascular;  Laterality: Right;  . Hemocatheter    . IR GENERIC HISTORICAL Right 04/29/2016   IR THROMBECTOMY AV FISTULA W/THROMBOLYSIS/PTA INC/SHUNT/IMG RIGHT 04/29/2016 Arne Cleveland, MD [facility]INTER[facility]  . IR GENERIC HISTORICAL  04/29/2016   IR US GUIDE VASC ACCESS RIGHT 04/29/2016 Arne Cleveland, MD [facility]  . IR THORACENTESIS ASP PLEURAL SPACE W/IMG GUIDE  12/06/2016  . KNEE ARTHROSCOPY Bilateral    bilateral  . KNEE ARTHROSCOPY WITH MEDIAL MENISECTOMY Left 02/26/2013   Procedure: KNEE ARTHROSCOPY WITH MEDIAL MENISECTOMY;  Surgeon: Garald Balding, MD;  Location: [place];  Service: Orthopedics;  Laterality: Left;  . LIGATION OF COMPETING BRANCHES OF ARTERIOVENOUS FISTULA Right 10/30/2015   Procedure: LIGATION OF COMPETING BRANCHES OF BRACHIOCEPHALIC ARTERIOVENOUS FISTULA;  Surgeon: Angelia Mould, MD;  Location: [place];  Service: Vascular;  Laterality: Right;  . NEPHRECTOMY Right September 12, 2012  . NEPHRECTOMY Left 04-06-2015   [place] Right 09/07/2015   Procedure: Fistulagram;  Surgeon: Angelia Mould, MD;  Location: [place] CV [facility];  Service: Cardiovascular;  Laterality: Right;  ARM  . PERIPHERAL VASCULAR CATHETERIZATION Right 09/07/2015   Procedure: Peripheral Vascular Balloon Angioplasty;  Surgeon: Angelia Mould, MD;  Location: [place] [facility];  Service: Cardiovascular;  Laterality: Right;  upper arm venous  . PROSTATECTOMY  04-06-2015   [place] Bilateral    bilateral  . TOTAL KNEE ARTHROPLASTY  01/24/2012   Procedure: TOTAL KNEE ARTHROPLASTY;  Surgeon: Garald Balding, MD;  Location: [place];  Service: Orthopedics;  Laterality: Right;  RIGHT TOTAL KNEE REPLACEMENT    SOCIAL HISTORY: Social History   Socioeconomic History  . Marital status: Widowed    Spouse name: Not on file  . Number of children: Not on file  . Years of education: Not on file  . Highest education level: Not on file  Social Needs  . Financial resource strain: Not on file  . Food insecurity - worry: Not on file  . Food insecurity - inability:  Not on file  . Transportation needs - medical: Not on file  . Transportation needs - non-medical: Not on file  Occupational History  . Not on file  Tobacco Use  . Smoking status: Never Smoker  . Smokeless tobacco: Never Used  Substance and Sexual Activity  . Alcohol use: No    Alcohol/week: 0.0 oz  . Drug use: No  . Sexual activity: Not on file  Other Topics Concern  . Not on file  Social History Narrative   Lives alone. Manages well per patient report. Does not cook much. Eats out a lot. Eats all food groups. Does not eat much fruit. Married in the past. Has one son. Used to drive a truck.     FAMILY HISTORY Family History  Problem Relation Age of Onset  . Cancer Mother   . Hypertension Father   . Atrial fibrillation Father   . Heart disease Father     ALLERGIES:  has No Known  Allergies.  MEDICATIONS:  Current Outpatient Medications  Medication Sig Dispense Refill  . ALPRAZolam (XANAX) 1 MG tablet Take 1 mg by mouth 2 (two) times daily as needed for anxiety.     . clobetasol cream (TEMOVATE) 5.02 % Apply 1 application topically 2 (two) times daily.    . fluticasone (FLONASE) 50 MCG/ACT nasal spray Place 1 spray into both nostrils daily.     Marland Kitchen oxyCODONE-acetaminophen (PERCOCET/ROXICET) 5-325 MG tablet 1 tablet every 4 (four) hours as needed.     [name] Glycol-Propyl Glycol (SYSTANE OP) Place 1 drop into both eyes daily as needed (for dry, itchy eyes).    . predniSONE (STERAPRED UNI-PAK 21 TAB) 10 MG (21) TBPK tablet Take 6 tabs on day 1, 5 tabs on day 2, 4 tabs on day 3, 3 tabs on day 4, 2 tabs on day 5 and 1 tab on day 6 21 tablet 0   No current facility-administered medications for this visit.     PHYSICAL EXAMINATION:  ECOG PERFORMANCE STATUS: 1 - Symptomatic but completely ambulatory   Vitals:   02/07/17 1116  BP: (!) 151/72  Pulse: 78  Resp: 18  Temp: 98.1 F (36.7 C)  SpO2: 100%    Filed Weights   02/07/17 1116  Weight: 255 lb (115.7 kg)     Physical Exam Constitutional: Well-developed, well-nourished, and in no distress.   HENT:  Head: Normocephalic and atraumatic.  Mouth/Throat: No oropharyngeal exudate. Mucosa moist. Eyes: Pupils are equal, round, and reactive to light. Conjunctivae are normal. No scleral icterus.  Neck: Normal range of motion. Neck supple. No JVD present.  Cardiovascular: Normal rate, regular rhythm and normal heart sounds.  Exam reveals no gallop and no friction rub.   No murmur heard. Pulmonary/Chest: Effort normal and breath sounds are normal in all lung fields except in the right lung base where it is decreased. No respiratory distress. No wheezes.No rales.  Abdominal: Soft. Bowel sounds are normal. No distension. There is no tenderness. There is no guarding. +peritoneal dialysis catheter in place with clean  bandage over it.  Musculoskeletal: No edema or tenderness.  Lymphadenopathy:    No cervical or supraclavicular adenopathy.  Neurological: Alert and oriented to person, place, and time. No cranial nerve deficit.  Skin: Skin is warm and dry. No rash noted. No erythema. No pallor.  Psychiatric: Affect and judgment normal.    LABORATORY DATA: I have personally reviewed the data as listed:  Appointment on 01/12/2017  Component Date Value Ref Range Status  .  WBC 01/12/2017 14.1* 4.0 - 10.5 K/uL Final  . RBC 01/12/2017 2.51* 4.22 - 5.81 MIL/uL Final  . Hemoglobin 01/12/2017 9.1* 13.0 - 17.0 g/dL Final  . HCT 01/12/2017 25.6* 39.0 - 52.0 % Final  . MCV 01/12/2017 102.0* 78.0 - 100.0 fL Final  . MCH 01/12/2017 36.3* 26.0 - 34.0 pg Final  . MCHC 01/12/2017 35.5  30.0 - 36.0 g/dL Final  . RDW 01/12/2017 15.2  11.5 - 15.5 % Final  . Platelets 01/12/2017 248  150 - 400 K/uL Final  Orders Only on 01/12/2017  Component Date Value Ref Range Status  . ABO/RH(D) 01/12/2017 B POS   Final  . Antibody Screen 01/12/2017 NEG   Final  . Sample Expiration 01/12/2017 01/15/2017   Final    RADIOGRAPHIC STUDIES: I have personally reviewed the radiological images as listed and agree with the findings in the report  No results found.  ASSESSMENT/PLAN 1. Urothelial carcinoma A. CT to evaluate hematuria revealed a 3.8 x 6.3 x 6.0 cm right renal mass B. Right nephrectomy, ureterectomy, and retroperitoneal lymph node dissection with Dr. Brendia Sacks on 09/12/2012. Pathology revealed high-grade papillary urothelial carcinoma pT3N0. C. Initiated chemotherapy with every 3 week cisplatin 35 mg/m2 and gemcitabine 1000 mg/m2 on days 1 & 8 on 10/21/2013 and completed 4 cycles on 12/30/2013. D. S/p ablation of 2 left ureter papillary tumors in April 2016.  E. S/p ablation of multiple ureteral and collecting system tumors in July 2016 and again in October 2016, with residual disease remaining. F. On basis of FGFR mutation,  Pazopanib 800 mg daily started 01/18/2015, dose-reduced to 600mg  daily on 02/02/2015, stopped 03/10/2015 for LFT abnormalities and upcoming surgery. G. 04/06/2015 completion GU-ectomy with Ta urothelial carcinoma in ureter and bladder, 0/9 lymph nodes  -Clinically NED.  -Reviewed with the patient his restaging scans from 11/28/2016; he has no evidence of recurrent or metastatic cancer.  Plan to restage with CT chest abdomen pelvis in 6 months.  2. Recurrent right pleural effusion s/p multiple thoracentesis with no malignant cells on cytology.  -Continue follow-up with Dr. Roxan Hockey.  Patient has an appointment today.  3. ESRD on home peritoneal dialysis.  -Continue home PD. Defer to nephrology for management.  4. Anemia in the setting of ESRD.  -Per his previous [facility] oncologist notes, the patient becomes symptomatic when hgb <8.5. In setting of ESRD and curative-intent cancer surgery, would limit ESAs and would transfuse as needed to keep hgb > 8.5. We can help to coordinate transfusions as needed. Last hemoglobin was 9.1 g/dL. Patient is getting ESA therapy through his nephrologist, so we will let him assume management of his anemia.  RTC in 6 months for follow up. Labs and restaging CT C/A/P.  Orders Placed This Encounter  Procedures  . CT Abdomen Pelvis W Contrast    Standing Status:   Future    Standing Expiration Date:   02/07/2018    Order Specific Question:   If indicated for the ordered procedure, I authorize the administration of contrast media per Radiology protocol    Answer:   Yes    Order Specific Question:   Preferred imaging location?    Answer:   [facility]    Order Specific Question:   Radiology Contrast Protocol - do NOT remove file path    Answer:   [email or web address]  . CT Chest W Contrast    Standing Status:   Future    Standing Expiration Date:   02/07/2018    Order Specific  Question:   If indicated for the ordered  procedure, I authorize the administration of contrast media per Radiology protocol    Answer:   Yes    Order Specific Question:   Preferred imaging location?    Answer:   [facility]    Order Specific Question:   Radiology Contrast Protocol - do NOT remove file path    Answer:   [email or web address]  . CBC with Differential    Standing Status:   Future    Standing Expiration Date:   02/07/2018  . Comprehensive metabolic panel    Standing Status:   Future    Standing Expiration Date:   02/07/2018    All questions were answered. The patient knows to call the clinic with any problems, questions or concerns.  This note was electronically signed.    Twana First, MD  02/07/2017 10:52 AM

## 2017-02-07 NOTE — Progress Notes (Signed)
Jesus [name]       [place] [address]             [phone]       HPI: [name] returns today for scheduled follow-up visit  He is a 66 year old man with a history of bilateral nephrectomies for renal cell carcinoma.  Most recent was a left nephrectomy in February 2017.  He has problems with recurrent right pleural effusion dating back to April 2017.  He had been getting thoracentesis about every 3-4 months.  I first saw him in October 2018.  We did a thoracentesis and the fluid was a mild exudate.  He was treated with a couple of prednisone tapers.  His most recent office visit was 01/03/2017.  He had a small loculated effusion on chest x-ray at that point.  Since his last visit he has been feeling well.  He has not had any problems with coughing, wheezing, or shortness of breath.  He denies orthopnea, which was his primary complaint previously.  Past Medical History:  Diagnosis Date  . Anemia   . Anxiety   . Arthritis   . Cancer [facility])    bladder, ureter, Bil kidneys  . Dermatitis    scaly bump occurs every few months, pt uses cortizone cream and it goes away  . ESRD (end stage renal disease) on dialysis ([place])    Bilateral Nephrectomies- due to cancer  . GERD (gastroesophageal reflux disease) 04/07/2015  . History of blood transfusion   . Hypertension   . Lumbar back pain   . Mental disorder   . Pleural effusion, right    s/p right thoracentesis 10/07/15 (no malignancy by cytology report)  . Shortness of breath dyspnea    Lying down gets short of breath    Current Outpatient Medications  Medication Sig Dispense Refill  . ALPRAZolam (XANAX) 1 MG tablet Take 1 tablet (1 mg total) by mouth 2 (two) times daily as needed for anxiety. 60 tablet 0  . amLODipine (NORVASC) 10 MG tablet     . clobetasol cream (TEMOVATE) 5.91 % Apply 1 application topically 2 (two) times daily.    . fluticasone (FLONASE) 50 MCG/ACT nasal spray Place 1 spray into both nostrils  daily.     Marland Kitchen oxyCODONE-acetaminophen (PERCOCET/ROXICET) 5-325 MG tablet 1 tablet every 4 (four) hours as needed.     [name] Glycol-Propyl Glycol (SYSTANE OP) Place 1 drop into both eyes daily as needed (for dry, itchy eyes).    . predniSONE (STERAPRED UNI-PAK 21 TAB) 10 MG (21) TBPK tablet Take 6 tabs on day 1, 5 tabs on day 2, 4 tabs on day 3, 3 tabs on day 4, 2 tabs on day 5 and 1 tab on day 6 21 tablet 0   No current facility-administered medications for this visit.     Physical Exam BP (!) 148/79   Pulse 68   Ht 5\' 10"  (1.778 m)   Wt 255 lb (115.7 kg)   SpO2 99%   BMI 36.86 kg/m  66 year old man in no acute distress Alert and oriented x3 with no focal deficits No cervical or supra clavicular adenopathy Cardiac regular rate and rhythm normal S1-S2 Lungs slightly diminished at right base  Diagnostic Tests: CHEST  2 VIEW  COMPARISON:  PA and lateral chest x-ray of January 03, 2017  FINDINGS: The left lung is adequately inflated. The lung markings are coarse in the infrahilar region on the left but less conspicuous  than on this study of 5 days ago. On the right there is a persistent smaller moderate-sized pleural effusion with basilar atelectasis or pneumonia. The heart and pulmonary vascularity are normal. The mediastinum is normal in width. There is multilevel degenerative disc disease of the thoracic spine.  IMPRESSION: Interval improvement in infiltrate in the left infrahilar region. Persistent right basilar atelectasis or pneumonia with smaller moderate-sized right pleural effusion.   Electronically Signed   By: David  Martinique M.D.   On: 02/07/2017 12:29 I personally reviewed the chest x-ray images and concur with the findings noted above.  I do think the effusion is stable and is relatively small with multiple loculations.  Impression: [name] is a 66 year old gentleman with a history of bilateral nephrectomies for renal cell carcinoma who has an  28-month history of recurrent right pleural effusions.  I suspect this is a post pneumonic effusion that developed in the early postoperative period after his left nephrectomy.  He has had multiple thoracenteses but it usually takes 3-4 months for the fluid to build up to a sufficient degree to require that.  His most recent thoracentesis was in October.  He has not had any recurrence of symptoms since then.  His chest x-ray is unchanged over the past month.  There really is a very small amount of loculated fluid currently.  I do not think surgical intervention is indicated.    I recommended to him that we repeat a chest x-ray in 3 months to rule out an enlarging effusion.  He knows to call if he is symptomatic in the meantime.  Renal cell carcinoma-followed by Dr. Ozzie Hoyle, MD [facility] [phone]

## 2017-02-08 DIAGNOSIS — J90 Pleural effusion, not elsewhere classified: Secondary | ICD-10-CM | POA: Diagnosis not present

## 2017-02-08 DIAGNOSIS — Z992 Dependence on renal dialysis: Secondary | ICD-10-CM | POA: Diagnosis not present

## 2017-02-08 DIAGNOSIS — D631 Anemia in chronic kidney disease: Secondary | ICD-10-CM | POA: Diagnosis not present

## 2017-02-08 DIAGNOSIS — K210 Gastro-esophageal reflux disease with esophagitis: Secondary | ICD-10-CM | POA: Diagnosis not present

## 2017-02-08 DIAGNOSIS — F419 Anxiety disorder, unspecified: Secondary | ICD-10-CM | POA: Diagnosis not present

## 2017-02-08 DIAGNOSIS — D509 Iron deficiency anemia, unspecified: Secondary | ICD-10-CM | POA: Diagnosis not present

## 2017-02-08 DIAGNOSIS — N2581 Secondary hyperparathyroidism of renal origin: Secondary | ICD-10-CM | POA: Diagnosis not present

## 2017-02-08 DIAGNOSIS — N186 End stage renal disease: Secondary | ICD-10-CM | POA: Diagnosis not present

## 2017-02-08 DIAGNOSIS — I10 Essential (primary) hypertension: Secondary | ICD-10-CM | POA: Diagnosis not present

## 2017-02-09 DIAGNOSIS — N2581 Secondary hyperparathyroidism of renal origin: Secondary | ICD-10-CM | POA: Diagnosis not present

## 2017-02-09 DIAGNOSIS — N186 End stage renal disease: Secondary | ICD-10-CM | POA: Diagnosis not present

## 2017-02-09 DIAGNOSIS — D631 Anemia in chronic kidney disease: Secondary | ICD-10-CM | POA: Diagnosis not present

## 2017-02-09 DIAGNOSIS — D509 Iron deficiency anemia, unspecified: Secondary | ICD-10-CM | POA: Diagnosis not present

## 2017-02-09 DIAGNOSIS — Z992 Dependence on renal dialysis: Secondary | ICD-10-CM | POA: Diagnosis not present

## 2017-02-10 DIAGNOSIS — N2581 Secondary hyperparathyroidism of renal origin: Secondary | ICD-10-CM | POA: Diagnosis not present

## 2017-02-10 DIAGNOSIS — Z992 Dependence on renal dialysis: Secondary | ICD-10-CM | POA: Diagnosis not present

## 2017-02-10 DIAGNOSIS — D509 Iron deficiency anemia, unspecified: Secondary | ICD-10-CM | POA: Diagnosis not present

## 2017-02-10 DIAGNOSIS — D631 Anemia in chronic kidney disease: Secondary | ICD-10-CM | POA: Diagnosis not present

## 2017-02-10 DIAGNOSIS — N186 End stage renal disease: Secondary | ICD-10-CM | POA: Diagnosis not present

## 2017-02-11 DIAGNOSIS — D509 Iron deficiency anemia, unspecified: Secondary | ICD-10-CM | POA: Diagnosis not present

## 2017-02-11 DIAGNOSIS — N186 End stage renal disease: Secondary | ICD-10-CM | POA: Diagnosis not present

## 2017-02-11 DIAGNOSIS — Z992 Dependence on renal dialysis: Secondary | ICD-10-CM | POA: Diagnosis not present

## 2017-02-11 DIAGNOSIS — D631 Anemia in chronic kidney disease: Secondary | ICD-10-CM | POA: Diagnosis not present

## 2017-02-11 DIAGNOSIS — N2581 Secondary hyperparathyroidism of renal origin: Secondary | ICD-10-CM | POA: Diagnosis not present

## 2017-02-12 DIAGNOSIS — D509 Iron deficiency anemia, unspecified: Secondary | ICD-10-CM | POA: Diagnosis not present

## 2017-02-12 DIAGNOSIS — Z992 Dependence on renal dialysis: Secondary | ICD-10-CM | POA: Diagnosis not present

## 2017-02-12 DIAGNOSIS — D631 Anemia in chronic kidney disease: Secondary | ICD-10-CM | POA: Diagnosis not present

## 2017-02-12 DIAGNOSIS — N186 End stage renal disease: Secondary | ICD-10-CM | POA: Diagnosis not present

## 2017-02-12 DIAGNOSIS — N2581 Secondary hyperparathyroidism of renal origin: Secondary | ICD-10-CM | POA: Diagnosis not present

## 2017-02-13 DIAGNOSIS — D509 Iron deficiency anemia, unspecified: Secondary | ICD-10-CM | POA: Diagnosis not present

## 2017-02-13 DIAGNOSIS — D631 Anemia in chronic kidney disease: Secondary | ICD-10-CM | POA: Diagnosis not present

## 2017-02-13 DIAGNOSIS — N2581 Secondary hyperparathyroidism of renal origin: Secondary | ICD-10-CM | POA: Diagnosis not present

## 2017-02-13 DIAGNOSIS — Z992 Dependence on renal dialysis: Secondary | ICD-10-CM | POA: Diagnosis not present

## 2017-02-13 DIAGNOSIS — N186 End stage renal disease: Secondary | ICD-10-CM | POA: Diagnosis not present

## 2017-02-14 DIAGNOSIS — D631 Anemia in chronic kidney disease: Secondary | ICD-10-CM | POA: Diagnosis not present

## 2017-02-14 DIAGNOSIS — Z992 Dependence on renal dialysis: Secondary | ICD-10-CM | POA: Diagnosis not present

## 2017-02-14 DIAGNOSIS — N2581 Secondary hyperparathyroidism of renal origin: Secondary | ICD-10-CM | POA: Diagnosis not present

## 2017-02-14 DIAGNOSIS — D509 Iron deficiency anemia, unspecified: Secondary | ICD-10-CM | POA: Diagnosis not present

## 2017-02-14 DIAGNOSIS — N186 End stage renal disease: Secondary | ICD-10-CM | POA: Diagnosis not present

## 2017-02-15 DIAGNOSIS — N2581 Secondary hyperparathyroidism of renal origin: Secondary | ICD-10-CM | POA: Diagnosis not present

## 2017-02-15 DIAGNOSIS — N186 End stage renal disease: Secondary | ICD-10-CM | POA: Diagnosis not present

## 2017-02-15 DIAGNOSIS — D509 Iron deficiency anemia, unspecified: Secondary | ICD-10-CM | POA: Diagnosis not present

## 2017-02-15 DIAGNOSIS — D631 Anemia in chronic kidney disease: Secondary | ICD-10-CM | POA: Diagnosis not present

## 2017-02-15 DIAGNOSIS — Z992 Dependence on renal dialysis: Secondary | ICD-10-CM | POA: Diagnosis not present

## 2017-02-16 DIAGNOSIS — N186 End stage renal disease: Secondary | ICD-10-CM | POA: Diagnosis not present

## 2017-02-16 DIAGNOSIS — D631 Anemia in chronic kidney disease: Secondary | ICD-10-CM | POA: Diagnosis not present

## 2017-02-16 DIAGNOSIS — D509 Iron deficiency anemia, unspecified: Secondary | ICD-10-CM | POA: Diagnosis not present

## 2017-02-16 DIAGNOSIS — N2581 Secondary hyperparathyroidism of renal origin: Secondary | ICD-10-CM | POA: Diagnosis not present

## 2017-02-16 DIAGNOSIS — Z992 Dependence on renal dialysis: Secondary | ICD-10-CM | POA: Diagnosis not present

## 2017-02-17 DIAGNOSIS — D631 Anemia in chronic kidney disease: Secondary | ICD-10-CM | POA: Diagnosis not present

## 2017-02-17 DIAGNOSIS — D509 Iron deficiency anemia, unspecified: Secondary | ICD-10-CM | POA: Diagnosis not present

## 2017-02-17 DIAGNOSIS — N2581 Secondary hyperparathyroidism of renal origin: Secondary | ICD-10-CM | POA: Diagnosis not present

## 2017-02-17 DIAGNOSIS — Z992 Dependence on renal dialysis: Secondary | ICD-10-CM | POA: Diagnosis not present

## 2017-02-17 DIAGNOSIS — N186 End stage renal disease: Secondary | ICD-10-CM | POA: Diagnosis not present

## 2017-02-18 DIAGNOSIS — D509 Iron deficiency anemia, unspecified: Secondary | ICD-10-CM | POA: Diagnosis not present

## 2017-02-18 DIAGNOSIS — Z992 Dependence on renal dialysis: Secondary | ICD-10-CM | POA: Diagnosis not present

## 2017-02-18 DIAGNOSIS — N2581 Secondary hyperparathyroidism of renal origin: Secondary | ICD-10-CM | POA: Diagnosis not present

## 2017-02-18 DIAGNOSIS — D631 Anemia in chronic kidney disease: Secondary | ICD-10-CM | POA: Diagnosis not present

## 2017-02-18 DIAGNOSIS — N186 End stage renal disease: Secondary | ICD-10-CM | POA: Diagnosis not present

## 2017-02-19 DIAGNOSIS — N2581 Secondary hyperparathyroidism of renal origin: Secondary | ICD-10-CM | POA: Diagnosis not present

## 2017-02-19 DIAGNOSIS — D509 Iron deficiency anemia, unspecified: Secondary | ICD-10-CM | POA: Diagnosis not present

## 2017-02-19 DIAGNOSIS — N186 End stage renal disease: Secondary | ICD-10-CM | POA: Diagnosis not present

## 2017-02-19 DIAGNOSIS — D631 Anemia in chronic kidney disease: Secondary | ICD-10-CM | POA: Diagnosis not present

## 2017-02-19 DIAGNOSIS — Z992 Dependence on renal dialysis: Secondary | ICD-10-CM | POA: Diagnosis not present

## 2017-02-20 DIAGNOSIS — D631 Anemia in chronic kidney disease: Secondary | ICD-10-CM | POA: Diagnosis not present

## 2017-02-20 DIAGNOSIS — Z992 Dependence on renal dialysis: Secondary | ICD-10-CM | POA: Diagnosis not present

## 2017-02-20 DIAGNOSIS — N186 End stage renal disease: Secondary | ICD-10-CM | POA: Diagnosis not present

## 2017-02-20 DIAGNOSIS — D509 Iron deficiency anemia, unspecified: Secondary | ICD-10-CM | POA: Diagnosis not present

## 2017-02-20 DIAGNOSIS — N2581 Secondary hyperparathyroidism of renal origin: Secondary | ICD-10-CM | POA: Diagnosis not present

## 2017-02-21 DIAGNOSIS — N186 End stage renal disease: Secondary | ICD-10-CM | POA: Diagnosis not present

## 2017-02-21 DIAGNOSIS — N2581 Secondary hyperparathyroidism of renal origin: Secondary | ICD-10-CM | POA: Diagnosis not present

## 2017-02-21 DIAGNOSIS — D509 Iron deficiency anemia, unspecified: Secondary | ICD-10-CM | POA: Diagnosis not present

## 2017-02-21 DIAGNOSIS — Z992 Dependence on renal dialysis: Secondary | ICD-10-CM | POA: Diagnosis not present

## 2017-02-21 DIAGNOSIS — D631 Anemia in chronic kidney disease: Secondary | ICD-10-CM | POA: Diagnosis not present

## 2017-02-22 DIAGNOSIS — D509 Iron deficiency anemia, unspecified: Secondary | ICD-10-CM | POA: Diagnosis not present

## 2017-02-22 DIAGNOSIS — N186 End stage renal disease: Secondary | ICD-10-CM | POA: Diagnosis not present

## 2017-02-22 DIAGNOSIS — Z992 Dependence on renal dialysis: Secondary | ICD-10-CM | POA: Diagnosis not present

## 2017-02-22 DIAGNOSIS — D631 Anemia in chronic kidney disease: Secondary | ICD-10-CM | POA: Diagnosis not present

## 2017-02-22 DIAGNOSIS — N2581 Secondary hyperparathyroidism of renal origin: Secondary | ICD-10-CM | POA: Diagnosis not present

## 2017-02-23 DIAGNOSIS — N2581 Secondary hyperparathyroidism of renal origin: Secondary | ICD-10-CM | POA: Diagnosis not present

## 2017-02-23 DIAGNOSIS — N186 End stage renal disease: Secondary | ICD-10-CM | POA: Diagnosis not present

## 2017-02-23 DIAGNOSIS — D509 Iron deficiency anemia, unspecified: Secondary | ICD-10-CM | POA: Diagnosis not present

## 2017-02-23 DIAGNOSIS — D631 Anemia in chronic kidney disease: Secondary | ICD-10-CM | POA: Diagnosis not present

## 2017-02-23 DIAGNOSIS — Z992 Dependence on renal dialysis: Secondary | ICD-10-CM | POA: Diagnosis not present

## 2017-02-24 DIAGNOSIS — D631 Anemia in chronic kidney disease: Secondary | ICD-10-CM | POA: Diagnosis not present

## 2017-02-24 DIAGNOSIS — N2581 Secondary hyperparathyroidism of renal origin: Secondary | ICD-10-CM | POA: Diagnosis not present

## 2017-02-24 DIAGNOSIS — N186 End stage renal disease: Secondary | ICD-10-CM | POA: Diagnosis not present

## 2017-02-24 DIAGNOSIS — Z992 Dependence on renal dialysis: Secondary | ICD-10-CM | POA: Diagnosis not present

## 2017-02-24 DIAGNOSIS — D509 Iron deficiency anemia, unspecified: Secondary | ICD-10-CM | POA: Diagnosis not present

## 2017-02-25 DIAGNOSIS — Z992 Dependence on renal dialysis: Secondary | ICD-10-CM | POA: Diagnosis not present

## 2017-02-25 DIAGNOSIS — D509 Iron deficiency anemia, unspecified: Secondary | ICD-10-CM | POA: Diagnosis not present

## 2017-02-25 DIAGNOSIS — N186 End stage renal disease: Secondary | ICD-10-CM | POA: Diagnosis not present

## 2017-02-25 DIAGNOSIS — D631 Anemia in chronic kidney disease: Secondary | ICD-10-CM | POA: Diagnosis not present

## 2017-02-25 DIAGNOSIS — N2581 Secondary hyperparathyroidism of renal origin: Secondary | ICD-10-CM | POA: Diagnosis not present

## 2017-02-26 DIAGNOSIS — D631 Anemia in chronic kidney disease: Secondary | ICD-10-CM | POA: Diagnosis not present

## 2017-02-26 DIAGNOSIS — Z992 Dependence on renal dialysis: Secondary | ICD-10-CM | POA: Diagnosis not present

## 2017-02-26 DIAGNOSIS — N186 End stage renal disease: Secondary | ICD-10-CM | POA: Diagnosis not present

## 2017-02-26 DIAGNOSIS — N2581 Secondary hyperparathyroidism of renal origin: Secondary | ICD-10-CM | POA: Diagnosis not present

## 2017-02-26 DIAGNOSIS — D509 Iron deficiency anemia, unspecified: Secondary | ICD-10-CM | POA: Diagnosis not present

## 2017-02-27 DIAGNOSIS — D509 Iron deficiency anemia, unspecified: Secondary | ICD-10-CM | POA: Diagnosis not present

## 2017-02-27 DIAGNOSIS — N186 End stage renal disease: Secondary | ICD-10-CM | POA: Diagnosis not present

## 2017-02-27 DIAGNOSIS — Z992 Dependence on renal dialysis: Secondary | ICD-10-CM | POA: Diagnosis not present

## 2017-02-27 DIAGNOSIS — D631 Anemia in chronic kidney disease: Secondary | ICD-10-CM | POA: Diagnosis not present

## 2017-02-27 DIAGNOSIS — N2581 Secondary hyperparathyroidism of renal origin: Secondary | ICD-10-CM | POA: Diagnosis not present

## 2017-02-28 DIAGNOSIS — N186 End stage renal disease: Secondary | ICD-10-CM | POA: Diagnosis not present

## 2017-02-28 DIAGNOSIS — D631 Anemia in chronic kidney disease: Secondary | ICD-10-CM | POA: Diagnosis not present

## 2017-02-28 DIAGNOSIS — Z992 Dependence on renal dialysis: Secondary | ICD-10-CM | POA: Diagnosis not present

## 2017-02-28 DIAGNOSIS — D509 Iron deficiency anemia, unspecified: Secondary | ICD-10-CM | POA: Diagnosis not present

## 2017-03-01 DIAGNOSIS — Z992 Dependence on renal dialysis: Secondary | ICD-10-CM | POA: Diagnosis not present

## 2017-03-01 DIAGNOSIS — N186 End stage renal disease: Secondary | ICD-10-CM | POA: Diagnosis not present

## 2017-03-01 DIAGNOSIS — D631 Anemia in chronic kidney disease: Secondary | ICD-10-CM | POA: Diagnosis not present

## 2017-03-01 DIAGNOSIS — D509 Iron deficiency anemia, unspecified: Secondary | ICD-10-CM | POA: Diagnosis not present

## 2017-03-02 DIAGNOSIS — D631 Anemia in chronic kidney disease: Secondary | ICD-10-CM | POA: Diagnosis not present

## 2017-03-02 DIAGNOSIS — N186 End stage renal disease: Secondary | ICD-10-CM | POA: Diagnosis not present

## 2017-03-02 DIAGNOSIS — Z992 Dependence on renal dialysis: Secondary | ICD-10-CM | POA: Diagnosis not present

## 2017-03-02 DIAGNOSIS — D509 Iron deficiency anemia, unspecified: Secondary | ICD-10-CM | POA: Diagnosis not present

## 2017-03-03 DIAGNOSIS — N186 End stage renal disease: Secondary | ICD-10-CM | POA: Diagnosis not present

## 2017-03-03 DIAGNOSIS — D509 Iron deficiency anemia, unspecified: Secondary | ICD-10-CM | POA: Diagnosis not present

## 2017-03-03 DIAGNOSIS — D631 Anemia in chronic kidney disease: Secondary | ICD-10-CM | POA: Diagnosis not present

## 2017-03-03 DIAGNOSIS — Z992 Dependence on renal dialysis: Secondary | ICD-10-CM | POA: Diagnosis not present

## 2017-03-04 DIAGNOSIS — Z992 Dependence on renal dialysis: Secondary | ICD-10-CM | POA: Diagnosis not present

## 2017-03-04 DIAGNOSIS — D509 Iron deficiency anemia, unspecified: Secondary | ICD-10-CM | POA: Diagnosis not present

## 2017-03-04 DIAGNOSIS — N186 End stage renal disease: Secondary | ICD-10-CM | POA: Diagnosis not present

## 2017-03-04 DIAGNOSIS — D631 Anemia in chronic kidney disease: Secondary | ICD-10-CM | POA: Diagnosis not present

## 2017-03-05 DIAGNOSIS — D509 Iron deficiency anemia, unspecified: Secondary | ICD-10-CM | POA: Diagnosis not present

## 2017-03-05 DIAGNOSIS — N186 End stage renal disease: Secondary | ICD-10-CM | POA: Diagnosis not present

## 2017-03-05 DIAGNOSIS — D631 Anemia in chronic kidney disease: Secondary | ICD-10-CM | POA: Diagnosis not present

## 2017-03-05 DIAGNOSIS — Z992 Dependence on renal dialysis: Secondary | ICD-10-CM | POA: Diagnosis not present

## 2017-03-06 DIAGNOSIS — Z992 Dependence on renal dialysis: Secondary | ICD-10-CM | POA: Diagnosis not present

## 2017-03-06 DIAGNOSIS — N186 End stage renal disease: Secondary | ICD-10-CM | POA: Diagnosis not present

## 2017-03-06 DIAGNOSIS — D631 Anemia in chronic kidney disease: Secondary | ICD-10-CM | POA: Diagnosis not present

## 2017-03-06 DIAGNOSIS — D509 Iron deficiency anemia, unspecified: Secondary | ICD-10-CM | POA: Diagnosis not present

## 2017-03-07 ENCOUNTER — Encounter: Payer: Self-pay | Admitting: Family Medicine

## 2017-03-07 ENCOUNTER — Other Ambulatory Visit: Payer: Self-pay

## 2017-03-07 ENCOUNTER — Ambulatory Visit (INDEPENDENT_AMBULATORY_CARE_PROVIDER_SITE_OTHER): Payer: Medicare Other | Admitting: Family Medicine

## 2017-03-07 VITALS — BP 160/82 | HR 66 | Temp 98.1°F | Resp 15 | Ht 70.0 in | Wt 249.2 lb

## 2017-03-07 DIAGNOSIS — Z992 Dependence on renal dialysis: Secondary | ICD-10-CM

## 2017-03-07 DIAGNOSIS — N186 End stage renal disease: Secondary | ICD-10-CM

## 2017-03-07 DIAGNOSIS — I10 Essential (primary) hypertension: Secondary | ICD-10-CM | POA: Diagnosis not present

## 2017-03-07 DIAGNOSIS — F339 Major depressive disorder, recurrent, unspecified: Secondary | ICD-10-CM | POA: Diagnosis not present

## 2017-03-07 DIAGNOSIS — D509 Iron deficiency anemia, unspecified: Secondary | ICD-10-CM | POA: Diagnosis not present

## 2017-03-07 DIAGNOSIS — D649 Anemia, unspecified: Secondary | ICD-10-CM | POA: Diagnosis not present

## 2017-03-07 DIAGNOSIS — D631 Anemia in chronic kidney disease: Secondary | ICD-10-CM | POA: Diagnosis not present

## 2017-03-07 MED ORDER — SERTRALINE HCL 50 MG PO TABS: ORAL_TABLET | ORAL | 3 refills | Status: DC

## 2017-03-07 NOTE — Progress Notes (Signed)
Patient ID: [name], male    DOB: 1950-07-23, 67 y.o.   MRN: [id number]  Chief Complaint  Patient presents with  . Follow-up  . Hypertension  . Depression    Allergies Patient has no known allergies.  Subjective:   Jesus [name] is a 67 y.o. male who presents to [facility] today.  HPI Jesus [name] presents today for follow up.  Reports that his breathing is a little bit better.  Was happy to find out that there was no increased pleural effusions on the chest x-ray when he was last seen by CT surgery.  Able to lay down without getting SOB. Goes back in 3/19 to see the CT surgeon and get CXR.  Has also [facility] by Dr. Talbert Cage for evaluation of renal cell carcinoma.  Is scheduled to get CT scan of chest, abdomen, and pelvis in the next several months. He reports that when he was seen there she did give him a refill of his Xanax which is helped him with his nerves.  He reports that he has been on the Xanax for approximately 10 years and uses it for when he feels nervous and shaky.  Reports that if he does not take it at night can only sleep about 1-2 hours without waking up.  Reports if he takes a Xanax he is at least able to get 4-6 hours of sleep. Reports that his mood is no better.  Reports that he feels like his mood is actually worsening.  Feels depressed and sad almost every day.  Reports that there are many days when he wishes he was dead but then no one would be around to take care of his dogs.  He reports that they are his reason for living.  He does have 1 son whom he reports he does not see very frequently.  Lives alone.  Denies any suicidal ideations or suicidal plan.  Reports that he would not hurt himself.  Was given Celexa to try back in October and reports that he did not do well with the medication.  Reports that it made him feel nauseated and he cannot sleep at night.  Of note he did not have his Xanax at that time.  He did not have a  worsening of his mood on the medication.  He does report he knows he needs some help with his mood.  He does have a scheduled upcoming appointment with Dr. Modesta [name] within the next several weeks.  He reports he would like to feel better but is not sure if he ever will.  Reports that he would be willing to try another medication.  Reports his energy level is low.  Does not have much appetite.  Reports does not even feel like cooking food.  Tends to eat lots of frozen meals, which he reports he knows are not good for his health.  Does not want to do things for fun or be around other people.  Energy is low.  Reports he also comes in today to discuss his blood pressure.  Reports that when he initially switched to peritoneal dialysis his blood pressure was getting low so his Norvasc and metoprolol were stopped.  Then his blood pressure started to increase again after resuming dialysis for several months.  Reports when he was seen last by his nephrologist he was told to start metoprolol 25 mg 1-1/2 tablets p.o. twice daily.  Reports that he has been  checking his blood pressure at home and it is been running in the 150s-160s.  Reports that approximately 2 weeks ago started back the amlodipine 10 mg, 1 p.o. daily.  Has been taking the metoprolol 1 pill a day also for about 1 week.  Only checks his blood pressure in the mornings after eating and several hours after taking medication and it tends to run with systolic readings in the 027 per his report.  He denies any chest pain.  No swelling in his extremities.  No palpitations.  Reports he does not feel bad or worse when his blood pressure is elevated.  Still doing peritoneal dialysis each night.  Does not make any urine.  Patient reports that he is also seen by Dr. [name] for neck pain.  Is taking Percocet for his chronic neck pain.  Is unable to have surgery on his neck due to his multiple medical problems which put him at high risk surgically.  Checked BP this morning  and it was 162. Took the norvasc at 8 am. Never checks it at any other time of the day.   Depression       The patient presents with depression.  This is a chronic problem.  The current episode started more than 1 year ago.   The onset quality is gradual.   The problem occurs constantly.  The problem has been gradually worsening since onset.  Associated symptoms include fatigue, hopelessness, insomnia, irritable, restlessness, decreased interest, appetite change and sad.  Associated symptoms include no decreased concentration, no helplessness, no headaches and no suicidal ideas.     The symptoms are aggravated by family issues.  Past treatments include SSRIs - Selective serotonin reuptake inhibitors.  Past compliance problems include medication issues.  Previous treatment provided no relief relief.  Past medical history includes chronic pain, chronic illness and depression.     Pertinent negatives include no dementia and no suicide attempts.   Past Medical History:  Diagnosis Date  . Anemia   . Anxiety   . Arthritis   . Cancer [facility])    bladder, ureter, Bil kidneys  . Dermatitis    scaly bump occurs every few months, pt uses cortizone cream and it goes away  . ESRD (end stage renal disease) on dialysis ([place])    Bilateral Nephrectomies- due to cancer  . GERD (gastroesophageal reflux disease) 04/07/2015  . History of blood transfusion   . Hypertension   . Lumbar back pain   . Mental disorder   . Pleural effusion, right    s/p right thoracentesis 10/07/15 (no malignancy by cytology report)  . Shortness of breath dyspnea    Lying down gets short of breath    Past Surgical History:  Procedure Laterality Date  . AV FISTULA PLACEMENT Right 06/05/2015   Procedure: ARTERIOVENOUS (AV) FISTULA CREATION RIGHT ARM;  Surgeon: Angelia Mould, MD;  Location: [place];  Service: Vascular;  Laterality: Right;  . BLADDER SURGERY  04-06-2015   removal of bladder/ [facility]   . CAPD  INSERTION N/A 06/24/2016   Procedure: LAPAROSCOPIC INSERTION CONTINUOUS AMBULATORY PERITONEAL DIALYSIS  (CAPD) CATHETER;  Surgeon: Clovis Riley, MD;  Location: [place];  Service: General;  Laterality: N/A;  . CARPAL TUNNEL RELEASE     left hand x 2; right hand x 1; right elbow  . CERVICAL FUSION     x 2  . EYE SURGERY Bilateral    Cataract removal  . FISTULA SUPERFICIALIZATION Right 10/30/2015  Procedure: SUPERFICIALIZATION BRACHIOCEPHALIC ARTERIOVENOUS FISTULA Right Arm;  Surgeon: Angelia Mould, MD;  Location: [place];  Service: Vascular;  Laterality: Right;  . Hemocatheter    . IR GENERIC HISTORICAL Right 04/29/2016   IR THROMBECTOMY AV FISTULA W/THROMBOLYSIS/PTA INC/SHUNT/IMG RIGHT 04/29/2016 Arne Cleveland, MD [facility]-INTERV RAD  . IR GENERIC HISTORICAL  04/29/2016   IR US GUIDE VASC ACCESS RIGHT 04/29/2016 Arne Cleveland, MD [facility]-INTERV RAD  . IR THORACENTESIS ASP PLEURAL SPACE W/IMG GUIDE  12/06/2016  . KNEE ARTHROSCOPY Bilateral    bilateral  . KNEE ARTHROSCOPY WITH MEDIAL MENISECTOMY Left 02/26/2013   Procedure: KNEE ARTHROSCOPY WITH MEDIAL MENISECTOMY;  Surgeon: Garald Balding, MD;  Location: [place];  Service: Orthopedics;  Laterality: Left;  . LIGATION OF COMPETING BRANCHES OF ARTERIOVENOUS FISTULA Right 10/30/2015   Procedure: LIGATION OF COMPETING BRANCHES OF BRACHIOCEPHALIC ARTERIOVENOUS FISTULA;  Surgeon: Angelia Mould, MD;  Location: [place];  Service: Vascular;  Laterality: Right;  . NEPHRECTOMY Right September 12, 2012  . NEPHRECTOMY Left 04-06-2015   [place] Right 09/07/2015   Procedure: Fistulagram;  Surgeon: Angelia Mould, MD;  Location: [place] [facility];  Service: Cardiovascular;  Laterality: Right;  ARM  . PERIPHERAL VASCULAR CATHETERIZATION Right 09/07/2015   Procedure: Peripheral Vascular Balloon Angioplasty;  Surgeon: Angelia Mould, MD;  Location: [place] [facility];  Service: Cardiovascular;   Laterality: Right;  upper arm venous  . PROSTATECTOMY  04-06-2015   [place] Bilateral    bilateral  . TOTAL KNEE ARTHROPLASTY  01/24/2012   Procedure: TOTAL KNEE ARTHROPLASTY;  Surgeon: Garald Balding, MD;  Location: [place];  Service: Orthopedics;  Laterality: Right;  RIGHT TOTAL KNEE REPLACEMENT    Family History  Problem Relation Age of Onset  . Cancer Mother   . Hypertension Father   . Atrial fibrillation Father   . Heart disease Father      Social History   Socioeconomic History  . Marital status: Widowed    Spouse name: None  . Number of children: None  . Years of education: None  . Highest education level: None  Social Needs  . Financial resource strain: None  . Food insecurity - worry: None  . Food insecurity - inability: None  . Transportation needs - medical: None  . Transportation needs - non-medical: None  Occupational History  . None  Tobacco Use  . Smoking status: Never Smoker  . Smokeless tobacco: Never Used  Substance and Sexual Activity  . Alcohol use: No    Alcohol/week: 0.0 oz  . Drug use: No  . Sexual activity: None  Other Topics Concern  . None  Social History Narrative   Lives alone. Manages well per patient report. Does not cook much. Eats out a lot. Eats all food groups. Does not eat much fruit. Married in the past. Has one son. Used to drive a truck.    Current Outpatient Medications on File Prior to Visit  Medication Sig Dispense Refill  . ALPRAZolam (XANAX) 1 MG tablet Take 1 tablet (1 mg total) by mouth 2 (two) times daily as needed for anxiety. 60 tablet 0  . amLODipine (NORVASC) 10 MG tablet     . fluticasone (FLONASE) 50 MCG/ACT nasal spray Place 1 spray into both nostrils daily.     [name] Glycol-Propyl Glycol (SYSTANE OP) Place 1 drop into both eyes daily as needed (for dry, itchy eyes).    Marland Kitchen  clobetasol cream (TEMOVATE) 6.57 % Apply 1 application topically 2 (two) times daily.      No current facility-administered medications on file prior to visit.     Review of Systems  Constitutional: Positive for appetite change and fatigue. Negative for chills, fever and unexpected weight change.  Eyes: Negative for visual disturbance.  Respiratory: Negative for cough, chest tightness and shortness of breath.   Cardiovascular: Negative for chest pain, palpitations and leg swelling.  Gastrointestinal: Negative for abdominal pain, constipation and diarrhea.  Genitourinary: Negative for flank pain.  Musculoskeletal: Positive for neck pain.  Skin: Negative for rash.  Neurological: Negative for tremors, syncope, speech difficulty, weakness, numbness and headaches.  Hematological: Negative for adenopathy. Does not bruise/bleed easily.  Psychiatric/Behavioral: Positive for depression, dysphoric mood and sleep disturbance. Negative for behavioral problems, confusion, decreased concentration, hallucinations, self-injury and suicidal ideas. The patient is nervous/anxious and has insomnia. The patient is not hyperactive.      Objective:   BP (!) 160/82 (BP Location: Left Arm, Patient Position: Sitting, Cuff Size: Normal)   Pulse 66   Temp 98.1 F (36.7 C) (Temporal)   Resp 15   Ht 5\' 10"  (1.778 m)   Wt 249 lb 4 oz (113.1 kg)   SpO2 97%   BMI 35.76 kg/m   Physical Exam  Constitutional: He appears well-developed and well-nourished. He is irritable. No distress.  Eyes: EOM are normal. Pupils are equal, round, and reactive to light.  Neck: Normal range of motion. Neck supple.  Cardiovascular: Normal rate and regular rhythm.  Pulmonary/Chest: Effort normal and breath sounds normal.  Skin: Skin is warm and dry.  Psychiatric: His speech is normal. Judgment and thought content normal. His affect is blunt. He is agitated. He is not actively hallucinating. Thought content is not paranoid and not delusional. Cognition and memory are normal. He exhibits a depressed mood. He expresses no  homicidal and no suicidal ideation. He expresses no suicidal plans and no homicidal plans. He is attentive.  Vitals reviewed.  Depression screen PHQ 2/9 03/07/2017  Decreased Interest 3  Down, Depressed, Hopeless 3  PHQ - 2 Score 6  Altered sleeping 3  Tired, decreased energy 3  Change in appetite 3  Feeling bad or failure about yourself  3  Trouble concentrating 0  Moving slowly or fidgety/restless 0  Suicidal thoughts 0  PHQ-9 Score 18  Difficult doing work/chores Very difficult    Assessment and Plan   1. Depression, recurrent ([place]) Patient with a long history of depression, seems to be worsening in severity, agreeable to treatment.  Patient with significant side effects per his report on citalopram.  Only try the medication for 5 days before discontinuing it.  Refuses to try this medication again.  Patient's insurance will pay for Paxil, Prozac, or sertraline.  At this time will try sertraline 50 mg, one half p.o. daily.  Patient was counseled in details regarding possible side effects to look out for when starting this medication.  We did discuss the nausea in detail.  I asked patient to to please try to make it through the first couple weeks with this medication and the nausea would resolve and hopefully he would get improvement in his depression.  He was told that if he developed any worsening of his mood or any suicidal to please seek medical help.  He voiced understanding.  He will keep his initial visit with psychiatry/Dr. Modesta [name] within the next several weeks.  Suicide risks evaluated and documented in note  if present or in the area below.  Patient does not have/denies the following risks: previous suicide attempts, family history of suicide, prior history of psychiatric disorder, history of alcohol or substance abuse disorder.Patient displays problem solving skills.   Patient specifically denies suicide ideation. Patient has access/information to healthcare contacts if situation  or mood changes where patient is a risk to self or others or mood becomes unstable.   During the encounter, the patient had good eye contact and firm handshake regarding safety contract and agreement to seek help if mood worsens and not to harm self.   Patient understands the treatment plan and is in agreement. Agrees to keep follow up and call prior or return to clinic if needed.   - sertraline (ZOLOFT) 50 MG tablet; Take 1/2 tablet by mouth each day as directed.  Dispense: 15 tablet; Refill: 3  2. HTN, goal below 140/90 At this time will increase to metoprolol 25 mg, 1-1/2 p.o. twice daily.  Continue Norvasc 10 mg, 1 p.o. daily.  Patient will bring his blood pressure cuff to follow-up.  He will record his readings and bring to office.  He will make an attempt to cut down on the sodium and if he does have to eat frozen meals will look for lower sodium options.  3. ESRD on dialysis [facility]) Keep scheduled follow-up visit with nephrology.  4. Anemia, unspecified type We will continue to monitor hemoglobin every 6 months and sooner if needed due to patient's symptomatology.  If hemoglobin is less than 9 will contact hematology for transfusion.   Today I did discuss with patient regarding his Xanax use and his Percocet use.  He feels that these medications are completely necessary.  They are not prescribed by myself but by other physicians by whom he is treated.  He does understand the risks of combination of these medications.  He understands that these medications increases risk for sedation, falls, medication side effects. Return in about 4 weeks (around 04/04/2017) for follow up. Caren Macadam, MD 03/07/2017

## 2017-03-07 NOTE — Patient Instructions (Signed)
Bring BP cuff to your follow up.  Take norvasc 10 mg a day in the morning Take metoprolol 25 mg, 1 1/2 pills twice a day

## 2017-03-08 DIAGNOSIS — D631 Anemia in chronic kidney disease: Secondary | ICD-10-CM | POA: Diagnosis not present

## 2017-03-08 DIAGNOSIS — Z992 Dependence on renal dialysis: Secondary | ICD-10-CM | POA: Diagnosis not present

## 2017-03-08 DIAGNOSIS — N186 End stage renal disease: Secondary | ICD-10-CM | POA: Diagnosis not present

## 2017-03-08 DIAGNOSIS — D509 Iron deficiency anemia, unspecified: Secondary | ICD-10-CM | POA: Diagnosis not present

## 2017-03-08 NOTE — Progress Notes (Signed)
Psychiatric Initial Adult Assessment   Patient Identification: Jesus [name] MRN:  [id number] Date of Evaluation:  03/14/2017 Referral Source: Dr. Caren Macadam Chief Complaint:   Chief Complaint    Establish Care; Anxiety; Psychiatric Evaluation    "I came here to get refill (Xanax)" Visit Diagnosis:    ICD-10-CM   1. Current moderate episode of major depressive disorder without prior episode (HCC) F32.1 TSH  2. Depression, recurrent (HCC) F33.9 sertraline (ZOLOFT) 50 MG tablet  3. Anxiety state F41.1     History of Present Illness:   Jesus [name] is a 67 y.o. year old male with a history of depression, hypertension, ESRD s/p bilateral nephrectomies for renal cell carcinoma, bladder cancer, Cervical spondylosis without myelopathy, anemia, who presents for follow up appointment for Current moderate episode of major depressive disorder without prior episode ([place]) - Plan: TSH  Depression, recurrent ([place]) - Plan: sertraline (ZOLOFT) 50 MG tablet  Anxiety state  He states that he is here to get refill of Xanax.  He has been on Xanax for several years, prescribed by his PCP Dr. Wenda Overland, who deceased recently.  He has been feeling anxious despite being on Xanax 1 mg twice a day.  He states that he would never go outside as he does not have any motivation to do it.  Although he used to enjoy playing golf, he has not been doing it for several years due to back pain and neck pain.  Although he try to work on car last months, it caused worsening in his pain and he stopped doing it.  He states that he suffers from kidney and bladder cancer in 2014.  He underwent chemotherapy and over 20 surgeries.  He states that he decided to get treatment for his 2 dogs; he would have gone if he were not to have his dogs.  He states that his wife deceased over 5 years ago after 35 years of marriage.  She had lung cancer and was admitted for surgery.  He ended her life support as he knew that is what she wanted.   Although he initially wanted her son to do it, the patient ended up doing it himself as his son declined to do it.  He misses her very often.  Although he is not "family person," he enjoyed being together with her.  He reports his frustration of having difficulty to get his medication, and states that he wishes to use marijuana as it would save his money. (Although he is later amenable to change in his medication).  He has middle insomnia, which he attributes to his pain.  He feels fatigue and has anhedonia.  He has fair to poor appetite.  He denies SI.  He has fair concentration.  He feels anxious, nervous and shaking.  He denies panic attacks.  He has mild nausea after starting sertraline. He denies alcohol use or drug use.   Per PMP,  On oxycodone,  Xanax 1 mg 60 tabs for 30 days filled on 02/07/2017 I have utilized the [place] Controlled Substances Reporting System (PMP AWARxE) to confirm adherence regarding the patient's medication. My review reveals appropriate prescription fills.   Associated Signs/Symptoms: Depression Symptoms:  depressed mood, anhedonia, fatigue, anxiety, panic attacks, loss of energy/fatigue, (Hypo) Manic Symptoms:  denies decreased need for sleep, euphoria Anxiety Symptoms:  Excessive Worry, Panic Symptoms, Psychotic Symptoms:  denies paranoia, AH, VH PTSD Symptoms: Negative  Past Psychiatric History:  Outpatient: denies Psychiatry admission: denies Previous suicide attempt: denies Past  trials of medication: citalopram (GI symptoms), Xanax History of violence: denies  Previous Psychotropic Medications: Yes   Substance Abuse History in the last 12 months:  No.  Consequences of Substance Abuse: NA  Past Medical History:  Past Medical History:  Diagnosis Date  . Anemia   . Anxiety   . Arthritis   . Cancer [facility])    bladder, ureter, Bil kidneys  . Dermatitis    scaly bump occurs every few months, pt uses cortizone cream and it goes away  . ESRD (end  stage renal disease) on dialysis ([place])    Bilateral Nephrectomies- due to cancer  . GERD (gastroesophageal reflux disease) 04/07/2015  . History of blood transfusion   . Hypertension   . Lumbar back pain   . Mental disorder   . Pleural effusion, right    s/p right thoracentesis 10/07/15 (no malignancy by cytology report)  . Shortness of breath dyspnea    Lying down gets short of breath    Past Surgical History:  Procedure Laterality Date  . AV FISTULA PLACEMENT Right 06/05/2015   Procedure: ARTERIOVENOUS (AV) FISTULA CREATION RIGHT ARM;  Surgeon: Angelia Mould, MD;  Location: [place];  Service: Vascular;  Laterality: Right;  . BLADDER SURGERY  04-06-2015   removal of bladder/ [facility]   . CAPD INSERTION N/A 06/24/2016   Procedure: LAPAROSCOPIC INSERTION CONTINUOUS AMBULATORY PERITONEAL DIALYSIS  (CAPD) CATHETER;  Surgeon: Clovis Riley, MD;  Location: [place];  Service: General;  Laterality: N/A;  . CARPAL TUNNEL RELEASE     left hand x 2; right hand x 1; right elbow  . CERVICAL FUSION     x 2  . EYE SURGERY Bilateral    Cataract removal  . FISTULA SUPERFICIALIZATION Right 10/30/2015   Procedure: SUPERFICIALIZATION BRACHIOCEPHALIC ARTERIOVENOUS FISTULA Right Arm;  Surgeon: Angelia Mould, MD;  Location: [place];  Service: Vascular;  Laterality: Right;  . Hemocatheter    . IR GENERIC HISTORICAL Right 04/29/2016   IR THROMBECTOMY AV FISTULA W/THROMBOLYSIS/PTA INC/SHUNT/IMG RIGHT 04/29/2016 Arne Cleveland, MD [facility]  . IR GENERIC HISTORICAL  04/29/2016   IR US GUIDE VASC ACCESS RIGHT 04/29/2016 Arne Cleveland, MD [facility]  . IR THORACENTESIS ASP PLEURAL SPACE W/IMG GUIDE  12/06/2016  . KNEE ARTHROSCOPY Bilateral    bilateral  . KNEE ARTHROSCOPY WITH MEDIAL MENISECTOMY Left 02/26/2013   Procedure: KNEE ARTHROSCOPY WITH MEDIAL MENISECTOMY;  Surgeon: Garald Balding, MD;  Location: [place];  Service: Orthopedics;  Laterality: Left;  . LIGATION OF COMPETING  BRANCHES OF ARTERIOVENOUS FISTULA Right 10/30/2015   Procedure: LIGATION OF COMPETING BRANCHES OF BRACHIOCEPHALIC ARTERIOVENOUS FISTULA;  Surgeon: Angelia Mould, MD;  Location: [place];  Service: Vascular;  Laterality: Right;  . NEPHRECTOMY Right September 12, 2012  . NEPHRECTOMY Left 04-06-2015   [place] Right 09/07/2015   Procedure: Fistulagram;  Surgeon: Angelia Mould, MD;  Location: [place] CV LAB;  Service: Cardiovascular;  Laterality: Right;  ARM  . PERIPHERAL VASCULAR CATHETERIZATION Right 09/07/2015   Procedure: Peripheral Vascular Balloon Angioplasty;  Surgeon: Angelia Mould, MD;  Location: [place] [facility];  Service: Cardiovascular;  Laterality: Right;  upper arm venous  . PROSTATECTOMY  04-06-2015   [place] Bilateral    bilateral  . TOTAL KNEE ARTHROPLASTY  01/24/2012   Procedure: TOTAL KNEE ARTHROPLASTY;  Surgeon: Garald Balding, MD;  Location: [place];  Service: Orthopedics;  Laterality: Right;  RIGHT TOTAL KNEE REPLACEMENT    Family Psychiatric History:  denies  Family History:  Family History  Problem Relation Age of Onset  . Cancer Mother   . Hypertension Father   . Atrial fibrillation Father   . Heart disease Father     Social History:   Social History   Socioeconomic History  . Marital status: Widowed    Spouse name: None  . Number of children: None  . Years of education: None  . Highest education level: None  Social Needs  . Financial resource strain: None  . Food insecurity - worry: None  . Food insecurity - inability: None  . Transportation needs - medical: None  . Transportation needs - non-medical: None  Occupational History  . None  Tobacco Use  . Smoking status: Never Smoker  . Smokeless tobacco: Never Used  Substance and Sexual Activity  . Alcohol use: No    Alcohol/week: 0.0 oz  . Drug use: No  . Sexual activity:  None  Other Topics Concern  . None  Social History Narrative   Lives alone. Manages well per patient report. Does not cook much. Eats out a lot. Eats all food groups. Does not eat much fruit. Married in the past. Has one son. Used to drive a truck.     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 "okay" "never close with sisters"  Allergies:  No Known Allergies  Metabolic Disorder Labs: No results found for: HGBA1C, MPG No results found for: PROLACTIN No results found for: CHOL, TRIG, HDL, CHOLHDL, VLDL, LDLCALC   Current Medications: Current Outpatient Medications  Medication Sig Dispense Refill  . amLODipine (NORVASC) 10 MG tablet     . clobetasol cream (TEMOVATE) 2.87 % Apply 1 application topically 2 (two) times daily.    . fluticasone (FLONASE) 50 MCG/ACT nasal spray Place 1 spray into both nostrils daily.     [name] Glycol-Propyl Glycol (SYSTANE OP) Place 1 drop into both eyes daily as needed (for dry, itchy eyes).    . sertraline (ZOLOFT) 50 MG tablet Take 1 tablet (50 mg total) by mouth daily. 30 tablet 0  . LORazepam (ATIVAN) 1 MG tablet Take 1 tablet (1 mg total) by mouth 2 (two) times daily as needed for anxiety. 60 tablet 0   No current facility-administered medications for this visit.     Neurologic: Headache: No Seizure: No Paresthesias:No  Musculoskeletal: Strength & Muscle Tone: within normal limits Gait & Station: normal Patient leans: N/A  Psychiatric Specialty Exam: Review of Systems  Constitutional: Positive for malaise/fatigue.  Musculoskeletal: Positive for back pain, joint pain and neck pain.  Psychiatric/Behavioral: Positive for depression. Negative for hallucinations, memory loss, substance abuse and suicidal ideas. The patient is nervous/anxious and has  insomnia.   All other systems reviewed and are negative.   Blood pressure (!) 165/80, pulse (!) 57, height 5\' 10"  (1.778 m), weight 247 lb (112 kg), SpO2 98 %.Body mass index is 35.44 kg/m.  General Appearance: Fairly Groomed  Eye Contact:  Good  Speech:  Clear and Coherent  Volume:  Normal  Mood:  "tired"  Affect:  Blunt, but smiles at the end of the interview  Thought Process:  Coherent and Goal Directed  Orientation:  Full (Time, Place, and  Person)  Thought Content:  Logical  Suicidal Thoughts:  No  Homicidal Thoughts:  No  Memory:  Immediate;   Good Recent;   Good Remote;   Good  Judgement:  Good  Insight:  Fair  Psychomotor Activity:  Normal  Concentration:  Concentration: Good and Attention Span: Good  Recall:  Good  Fund of Knowledge:Good  Language: Good  Akathisia:  No  Handed:  Right  AIMS (if indicated):  N/A  Assets:  Communication Skills Desire for Improvement  ADL's:  Intact  Cognition: WNL  Sleep:  poor   Assessment Jesus [name] is a 67 y.o. year old male with a history of depression, hypertension, ESRD s/p bilateral nephrectomies for renal cell carcinoma, bladder cancer, Cervical spondylosis without myelopathy, anemia, who presents for follow up appointment for No diagnosis found.  # MDD, moderate, single episode without psychotic features # Unspecified anxiety disorder # r/o MDD due to another medical condition Patient endorses neurovegetative symptoms with marked anhedonia, anxiety in the setting of psychosocial stressors of chronic pain, being on dialysis, having underwent surgeries for cancer and grief of loss of his wife.  Although medical condition such as anemia and electrolyte abnormality (hyponatremia and uremia) can certainly contribute to his mood symptoms, will uptitrate sertraline to target neurovegetative symptoms. Will switch from Xanax to ativan given it has longer half life. Will obtain TSH to rule out medical cause. Validated his grief.  Although he will greatly benefit from supportive therapy given demoralization and grief, he is not interested in this option.  will continue to discuss as needed.   Plan 1. Increase sertraline 50 mg daily (try taking at night if it causes nausea) 2. Discontinue Xanax 3. Start Ativan 1 mg twice a day as needed for anxiety  4. Obtain TSH 5. Consider therapy   The patient demonstrates the following risk factors for suicide: Chronic risk factors for suicide include: psychiatric disorder of depression, anxiety and chronic pain. Acute risk factors for suicide include: unemployment and social withdrawal/isolation. Protective factors for this patient include: coping skills and hope for the future. Considering these factors, the overall suicide risk at this point appears to be low. Patient is appropriate for outpatient follow up.   Treatment Plan Summary: Plan as above   Norman Clay, MD 1/15/20192:18 PM

## 2017-03-09 DIAGNOSIS — D631 Anemia in chronic kidney disease: Secondary | ICD-10-CM | POA: Diagnosis not present

## 2017-03-09 DIAGNOSIS — N186 End stage renal disease: Secondary | ICD-10-CM | POA: Diagnosis not present

## 2017-03-09 DIAGNOSIS — Z992 Dependence on renal dialysis: Secondary | ICD-10-CM | POA: Diagnosis not present

## 2017-03-09 DIAGNOSIS — D509 Iron deficiency anemia, unspecified: Secondary | ICD-10-CM | POA: Diagnosis not present

## 2017-03-10 DIAGNOSIS — D509 Iron deficiency anemia, unspecified: Secondary | ICD-10-CM | POA: Diagnosis not present

## 2017-03-10 DIAGNOSIS — Z992 Dependence on renal dialysis: Secondary | ICD-10-CM | POA: Diagnosis not present

## 2017-03-10 DIAGNOSIS — N186 End stage renal disease: Secondary | ICD-10-CM | POA: Diagnosis not present

## 2017-03-10 DIAGNOSIS — D631 Anemia in chronic kidney disease: Secondary | ICD-10-CM | POA: Diagnosis not present

## 2017-03-11 DIAGNOSIS — D631 Anemia in chronic kidney disease: Secondary | ICD-10-CM | POA: Diagnosis not present

## 2017-03-11 DIAGNOSIS — N186 End stage renal disease: Secondary | ICD-10-CM | POA: Diagnosis not present

## 2017-03-11 DIAGNOSIS — Z992 Dependence on renal dialysis: Secondary | ICD-10-CM | POA: Diagnosis not present

## 2017-03-11 DIAGNOSIS — D509 Iron deficiency anemia, unspecified: Secondary | ICD-10-CM | POA: Diagnosis not present

## 2017-03-12 DIAGNOSIS — D631 Anemia in chronic kidney disease: Secondary | ICD-10-CM | POA: Diagnosis not present

## 2017-03-12 DIAGNOSIS — N186 End stage renal disease: Secondary | ICD-10-CM | POA: Diagnosis not present

## 2017-03-12 DIAGNOSIS — Z992 Dependence on renal dialysis: Secondary | ICD-10-CM | POA: Diagnosis not present

## 2017-03-12 DIAGNOSIS — D509 Iron deficiency anemia, unspecified: Secondary | ICD-10-CM | POA: Diagnosis not present

## 2017-03-13 DIAGNOSIS — N186 End stage renal disease: Secondary | ICD-10-CM | POA: Diagnosis not present

## 2017-03-13 DIAGNOSIS — D509 Iron deficiency anemia, unspecified: Secondary | ICD-10-CM | POA: Diagnosis not present

## 2017-03-13 DIAGNOSIS — Z992 Dependence on renal dialysis: Secondary | ICD-10-CM | POA: Diagnosis not present

## 2017-03-13 DIAGNOSIS — D631 Anemia in chronic kidney disease: Secondary | ICD-10-CM | POA: Diagnosis not present

## 2017-03-14 ENCOUNTER — Encounter (HOSPITAL_COMMUNITY): Payer: Self-pay | Admitting: Psychiatry

## 2017-03-14 ENCOUNTER — Ambulatory Visit (INDEPENDENT_AMBULATORY_CARE_PROVIDER_SITE_OTHER): Payer: Medicare Other | Admitting: Psychiatry

## 2017-03-14 VITALS — BP 165/80 | HR 57 | Ht 70.0 in | Wt 247.0 lb

## 2017-03-14 DIAGNOSIS — F321 Major depressive disorder, single episode, moderate: Secondary | ICD-10-CM

## 2017-03-14 DIAGNOSIS — F411 Generalized anxiety disorder: Secondary | ICD-10-CM | POA: Diagnosis not present

## 2017-03-14 DIAGNOSIS — Z992 Dependence on renal dialysis: Secondary | ICD-10-CM | POA: Diagnosis not present

## 2017-03-14 DIAGNOSIS — R450 Nervousness: Secondary | ICD-10-CM | POA: Diagnosis not present

## 2017-03-14 DIAGNOSIS — G4700 Insomnia, unspecified: Secondary | ICD-10-CM

## 2017-03-14 DIAGNOSIS — M542 Cervicalgia: Secondary | ICD-10-CM | POA: Diagnosis not present

## 2017-03-14 DIAGNOSIS — F410 Panic disorder [episodic paroxysmal anxiety] without agoraphobia: Secondary | ICD-10-CM | POA: Diagnosis not present

## 2017-03-14 DIAGNOSIS — Z85528 Personal history of other malignant neoplasm of kidney: Secondary | ICD-10-CM | POA: Diagnosis not present

## 2017-03-14 DIAGNOSIS — F331 Major depressive disorder, recurrent, moderate: Secondary | ICD-10-CM | POA: Diagnosis not present

## 2017-03-14 DIAGNOSIS — N186 End stage renal disease: Secondary | ICD-10-CM | POA: Diagnosis not present

## 2017-03-14 DIAGNOSIS — M2550 Pain in unspecified joint: Secondary | ICD-10-CM

## 2017-03-14 DIAGNOSIS — M549 Dorsalgia, unspecified: Secondary | ICD-10-CM | POA: Diagnosis not present

## 2017-03-14 DIAGNOSIS — D631 Anemia in chronic kidney disease: Secondary | ICD-10-CM | POA: Diagnosis not present

## 2017-03-14 DIAGNOSIS — Z8551 Personal history of malignant neoplasm of bladder: Secondary | ICD-10-CM | POA: Diagnosis not present

## 2017-03-14 DIAGNOSIS — F339 Major depressive disorder, recurrent, unspecified: Secondary | ICD-10-CM

## 2017-03-14 DIAGNOSIS — D509 Iron deficiency anemia, unspecified: Secondary | ICD-10-CM | POA: Diagnosis not present

## 2017-03-14 LAB — TSH: TSH: 3.97 m[IU]/L (ref 0.40–4.50)

## 2017-03-14 MED ORDER — LORAZEPAM 1 MG PO TABS: 1.0000 mg | ORAL_TABLET | Freq: Two times a day (BID) | ORAL | 0 refills | Status: DC | PRN

## 2017-03-14 MED ORDER — SERTRALINE HCL 50 MG PO TABS: 50.0000 mg | ORAL_TABLET | Freq: Every day | ORAL | 0 refills | Status: DC

## 2017-03-14 NOTE — Patient Instructions (Addendum)
1. Increase sertraline 50 mg daily (try taking at night if it causes nausea) 2. Discontinue Xanax 3. Start Ativan 1 mg twice a day as needed for anxiety  4. Obtain TSH 5. Consider therapy

## 2017-03-15 DIAGNOSIS — N186 End stage renal disease: Secondary | ICD-10-CM | POA: Diagnosis not present

## 2017-03-15 DIAGNOSIS — Z992 Dependence on renal dialysis: Secondary | ICD-10-CM | POA: Diagnosis not present

## 2017-03-15 DIAGNOSIS — D631 Anemia in chronic kidney disease: Secondary | ICD-10-CM | POA: Diagnosis not present

## 2017-03-15 DIAGNOSIS — D509 Iron deficiency anemia, unspecified: Secondary | ICD-10-CM | POA: Diagnosis not present

## 2017-03-16 ENCOUNTER — Ambulatory Visit (HOSPITAL_COMMUNITY): Payer: Medicare Other | Admitting: Psychiatry

## 2017-03-16 DIAGNOSIS — D631 Anemia in chronic kidney disease: Secondary | ICD-10-CM | POA: Diagnosis not present

## 2017-03-16 DIAGNOSIS — N186 End stage renal disease: Secondary | ICD-10-CM | POA: Diagnosis not present

## 2017-03-16 DIAGNOSIS — Z992 Dependence on renal dialysis: Secondary | ICD-10-CM | POA: Diagnosis not present

## 2017-03-16 DIAGNOSIS — D509 Iron deficiency anemia, unspecified: Secondary | ICD-10-CM | POA: Diagnosis not present

## 2017-03-17 DIAGNOSIS — D509 Iron deficiency anemia, unspecified: Secondary | ICD-10-CM | POA: Diagnosis not present

## 2017-03-17 DIAGNOSIS — N186 End stage renal disease: Secondary | ICD-10-CM | POA: Diagnosis not present

## 2017-03-17 DIAGNOSIS — Z992 Dependence on renal dialysis: Secondary | ICD-10-CM | POA: Diagnosis not present

## 2017-03-17 DIAGNOSIS — D631 Anemia in chronic kidney disease: Secondary | ICD-10-CM | POA: Diagnosis not present

## 2017-03-18 DIAGNOSIS — D509 Iron deficiency anemia, unspecified: Secondary | ICD-10-CM | POA: Diagnosis not present

## 2017-03-18 DIAGNOSIS — Z992 Dependence on renal dialysis: Secondary | ICD-10-CM | POA: Diagnosis not present

## 2017-03-18 DIAGNOSIS — D631 Anemia in chronic kidney disease: Secondary | ICD-10-CM | POA: Diagnosis not present

## 2017-03-18 DIAGNOSIS — N186 End stage renal disease: Secondary | ICD-10-CM | POA: Diagnosis not present

## 2017-03-19 DIAGNOSIS — N186 End stage renal disease: Secondary | ICD-10-CM | POA: Diagnosis not present

## 2017-03-19 DIAGNOSIS — D631 Anemia in chronic kidney disease: Secondary | ICD-10-CM | POA: Diagnosis not present

## 2017-03-19 DIAGNOSIS — Z992 Dependence on renal dialysis: Secondary | ICD-10-CM | POA: Diagnosis not present

## 2017-03-19 DIAGNOSIS — D509 Iron deficiency anemia, unspecified: Secondary | ICD-10-CM | POA: Diagnosis not present

## 2017-03-20 DIAGNOSIS — Z992 Dependence on renal dialysis: Secondary | ICD-10-CM | POA: Diagnosis not present

## 2017-03-20 DIAGNOSIS — D509 Iron deficiency anemia, unspecified: Secondary | ICD-10-CM | POA: Diagnosis not present

## 2017-03-20 DIAGNOSIS — N186 End stage renal disease: Secondary | ICD-10-CM | POA: Diagnosis not present

## 2017-03-20 DIAGNOSIS — D631 Anemia in chronic kidney disease: Secondary | ICD-10-CM | POA: Diagnosis not present

## 2017-03-21 DIAGNOSIS — M5020 Other cervical disc displacement, unspecified cervical region: Secondary | ICD-10-CM | POA: Diagnosis not present

## 2017-03-21 DIAGNOSIS — D509 Iron deficiency anemia, unspecified: Secondary | ICD-10-CM | POA: Diagnosis not present

## 2017-03-21 DIAGNOSIS — Z992 Dependence on renal dialysis: Secondary | ICD-10-CM | POA: Diagnosis not present

## 2017-03-21 DIAGNOSIS — D631 Anemia in chronic kidney disease: Secondary | ICD-10-CM | POA: Diagnosis not present

## 2017-03-21 DIAGNOSIS — M47816 Spondylosis without myelopathy or radiculopathy, lumbar region: Secondary | ICD-10-CM | POA: Diagnosis not present

## 2017-03-21 DIAGNOSIS — N186 End stage renal disease: Secondary | ICD-10-CM | POA: Diagnosis not present

## 2017-03-22 DIAGNOSIS — D509 Iron deficiency anemia, unspecified: Secondary | ICD-10-CM | POA: Diagnosis not present

## 2017-03-22 DIAGNOSIS — D631 Anemia in chronic kidney disease: Secondary | ICD-10-CM | POA: Diagnosis not present

## 2017-03-22 DIAGNOSIS — Z992 Dependence on renal dialysis: Secondary | ICD-10-CM | POA: Diagnosis not present

## 2017-03-22 DIAGNOSIS — N186 End stage renal disease: Secondary | ICD-10-CM | POA: Diagnosis not present

## 2017-03-23 DIAGNOSIS — Z992 Dependence on renal dialysis: Secondary | ICD-10-CM | POA: Diagnosis not present

## 2017-03-23 DIAGNOSIS — D509 Iron deficiency anemia, unspecified: Secondary | ICD-10-CM | POA: Diagnosis not present

## 2017-03-23 DIAGNOSIS — N186 End stage renal disease: Secondary | ICD-10-CM | POA: Diagnosis not present

## 2017-03-23 DIAGNOSIS — D631 Anemia in chronic kidney disease: Secondary | ICD-10-CM | POA: Diagnosis not present

## 2017-03-24 DIAGNOSIS — N186 End stage renal disease: Secondary | ICD-10-CM | POA: Diagnosis not present

## 2017-03-24 DIAGNOSIS — D509 Iron deficiency anemia, unspecified: Secondary | ICD-10-CM | POA: Diagnosis not present

## 2017-03-24 DIAGNOSIS — D631 Anemia in chronic kidney disease: Secondary | ICD-10-CM | POA: Diagnosis not present

## 2017-03-24 DIAGNOSIS — Z992 Dependence on renal dialysis: Secondary | ICD-10-CM | POA: Diagnosis not present

## 2017-03-25 DIAGNOSIS — N186 End stage renal disease: Secondary | ICD-10-CM | POA: Diagnosis not present

## 2017-03-25 DIAGNOSIS — D509 Iron deficiency anemia, unspecified: Secondary | ICD-10-CM | POA: Diagnosis not present

## 2017-03-25 DIAGNOSIS — Z992 Dependence on renal dialysis: Secondary | ICD-10-CM | POA: Diagnosis not present

## 2017-03-25 DIAGNOSIS — D631 Anemia in chronic kidney disease: Secondary | ICD-10-CM | POA: Diagnosis not present

## 2017-03-26 DIAGNOSIS — D509 Iron deficiency anemia, unspecified: Secondary | ICD-10-CM | POA: Diagnosis not present

## 2017-03-26 DIAGNOSIS — D631 Anemia in chronic kidney disease: Secondary | ICD-10-CM | POA: Diagnosis not present

## 2017-03-26 DIAGNOSIS — N186 End stage renal disease: Secondary | ICD-10-CM | POA: Diagnosis not present

## 2017-03-26 DIAGNOSIS — Z992 Dependence on renal dialysis: Secondary | ICD-10-CM | POA: Diagnosis not present

## 2017-03-27 DIAGNOSIS — D631 Anemia in chronic kidney disease: Secondary | ICD-10-CM | POA: Diagnosis not present

## 2017-03-27 DIAGNOSIS — Z992 Dependence on renal dialysis: Secondary | ICD-10-CM | POA: Diagnosis not present

## 2017-03-27 DIAGNOSIS — D509 Iron deficiency anemia, unspecified: Secondary | ICD-10-CM | POA: Diagnosis not present

## 2017-03-27 DIAGNOSIS — N186 End stage renal disease: Secondary | ICD-10-CM | POA: Diagnosis not present

## 2017-03-28 DIAGNOSIS — N186 End stage renal disease: Secondary | ICD-10-CM | POA: Diagnosis not present

## 2017-03-28 DIAGNOSIS — Z992 Dependence on renal dialysis: Secondary | ICD-10-CM | POA: Diagnosis not present

## 2017-03-28 DIAGNOSIS — D509 Iron deficiency anemia, unspecified: Secondary | ICD-10-CM | POA: Diagnosis not present

## 2017-03-28 DIAGNOSIS — D631 Anemia in chronic kidney disease: Secondary | ICD-10-CM | POA: Diagnosis not present

## 2017-03-29 DIAGNOSIS — N186 End stage renal disease: Secondary | ICD-10-CM | POA: Diagnosis not present

## 2017-03-29 DIAGNOSIS — D509 Iron deficiency anemia, unspecified: Secondary | ICD-10-CM | POA: Diagnosis not present

## 2017-03-29 DIAGNOSIS — Z992 Dependence on renal dialysis: Secondary | ICD-10-CM | POA: Diagnosis not present

## 2017-03-29 DIAGNOSIS — D631 Anemia in chronic kidney disease: Secondary | ICD-10-CM | POA: Diagnosis not present

## 2017-03-30 DIAGNOSIS — D509 Iron deficiency anemia, unspecified: Secondary | ICD-10-CM | POA: Diagnosis not present

## 2017-03-30 DIAGNOSIS — D631 Anemia in chronic kidney disease: Secondary | ICD-10-CM | POA: Diagnosis not present

## 2017-03-30 DIAGNOSIS — N186 End stage renal disease: Secondary | ICD-10-CM | POA: Diagnosis not present

## 2017-03-30 DIAGNOSIS — Z992 Dependence on renal dialysis: Secondary | ICD-10-CM | POA: Diagnosis not present

## 2017-03-31 DIAGNOSIS — D509 Iron deficiency anemia, unspecified: Secondary | ICD-10-CM | POA: Diagnosis not present

## 2017-03-31 DIAGNOSIS — Z992 Dependence on renal dialysis: Secondary | ICD-10-CM | POA: Diagnosis not present

## 2017-03-31 DIAGNOSIS — N186 End stage renal disease: Secondary | ICD-10-CM | POA: Diagnosis not present

## 2017-03-31 DIAGNOSIS — D631 Anemia in chronic kidney disease: Secondary | ICD-10-CM | POA: Diagnosis not present

## 2017-04-01 DIAGNOSIS — D509 Iron deficiency anemia, unspecified: Secondary | ICD-10-CM | POA: Diagnosis not present

## 2017-04-01 DIAGNOSIS — D631 Anemia in chronic kidney disease: Secondary | ICD-10-CM | POA: Diagnosis not present

## 2017-04-01 DIAGNOSIS — N186 End stage renal disease: Secondary | ICD-10-CM | POA: Diagnosis not present

## 2017-04-01 DIAGNOSIS — Z992 Dependence on renal dialysis: Secondary | ICD-10-CM | POA: Diagnosis not present

## 2017-04-02 DIAGNOSIS — D509 Iron deficiency anemia, unspecified: Secondary | ICD-10-CM | POA: Diagnosis not present

## 2017-04-02 DIAGNOSIS — Z992 Dependence on renal dialysis: Secondary | ICD-10-CM | POA: Diagnosis not present

## 2017-04-02 DIAGNOSIS — N186 End stage renal disease: Secondary | ICD-10-CM | POA: Diagnosis not present

## 2017-04-02 DIAGNOSIS — D631 Anemia in chronic kidney disease: Secondary | ICD-10-CM | POA: Diagnosis not present

## 2017-04-03 DIAGNOSIS — D509 Iron deficiency anemia, unspecified: Secondary | ICD-10-CM | POA: Diagnosis not present

## 2017-04-03 DIAGNOSIS — Z992 Dependence on renal dialysis: Secondary | ICD-10-CM | POA: Diagnosis not present

## 2017-04-03 DIAGNOSIS — N186 End stage renal disease: Secondary | ICD-10-CM | POA: Diagnosis not present

## 2017-04-03 DIAGNOSIS — D631 Anemia in chronic kidney disease: Secondary | ICD-10-CM | POA: Diagnosis not present

## 2017-04-04 ENCOUNTER — Ambulatory Visit (INDEPENDENT_AMBULATORY_CARE_PROVIDER_SITE_OTHER): Payer: Medicare Other | Admitting: Family Medicine

## 2017-04-04 ENCOUNTER — Encounter: Payer: Self-pay | Admitting: Family Medicine

## 2017-04-04 ENCOUNTER — Other Ambulatory Visit: Payer: Self-pay

## 2017-04-04 VITALS — BP 142/68 | HR 66 | Temp 97.9°F | Resp 16 | Ht 70.0 in | Wt 249.2 lb

## 2017-04-04 DIAGNOSIS — I10 Essential (primary) hypertension: Secondary | ICD-10-CM

## 2017-04-04 DIAGNOSIS — Z1159 Encounter for screening for other viral diseases: Secondary | ICD-10-CM | POA: Diagnosis not present

## 2017-04-04 DIAGNOSIS — D631 Anemia in chronic kidney disease: Secondary | ICD-10-CM | POA: Diagnosis not present

## 2017-04-04 DIAGNOSIS — F329 Major depressive disorder, single episode, unspecified: Secondary | ICD-10-CM

## 2017-04-04 DIAGNOSIS — N186 End stage renal disease: Secondary | ICD-10-CM | POA: Diagnosis not present

## 2017-04-04 DIAGNOSIS — Z114 Encounter for screening for human immunodeficiency virus [HIV]: Secondary | ICD-10-CM

## 2017-04-04 DIAGNOSIS — Z1211 Encounter for screening for malignant neoplasm of colon: Secondary | ICD-10-CM

## 2017-04-04 DIAGNOSIS — F419 Anxiety disorder, unspecified: Secondary | ICD-10-CM

## 2017-04-04 DIAGNOSIS — F32A Depression, unspecified: Secondary | ICD-10-CM

## 2017-04-04 DIAGNOSIS — Z992 Dependence on renal dialysis: Secondary | ICD-10-CM | POA: Diagnosis not present

## 2017-04-04 DIAGNOSIS — D509 Iron deficiency anemia, unspecified: Secondary | ICD-10-CM | POA: Diagnosis not present

## 2017-04-04 NOTE — Progress Notes (Signed)
Patient ID: Jesus [name], male    DOB: [birth year], 67 y.o.   MRN: [id number]  Chief Complaint  Patient presents with  . Follow-up  . Medication Management    Allergies Patient has no known allergies.  Subjective:   Jesus [name] is a 67 y.o. male who presents to [facility] today.  HPI Here for follow up. Reports that she went to see Dr. Modesta Messing and he was unable to tolerate the medication that she gave him. Reports that he has not been able to tolerate the zoloft b/c it made him feel nauseated. Is supposed to go see Dr. Modesta Messing on 2/15 for follow up. Reports that she gave him lorazepam to use instead of the xanax. He has not tried it yet b/c he has not ran out of xanax. Reports that he still Is not able to sleep well at night and wakes up q3 hours. Reports that he has been checking BP at home. Has been running 120-140s/70s-80s.  Brings in his cuff today so that we can check it in the office.  Reports that he is unhappy with his peritoneal dialysis because basically he is dealing with his dialysis all the time.  Reports that pretty much all day he is running some fluid in or out and cannot really get done until after lunch each day.  He reports that his weight has been stable.  Denies any chest pain, shortness of breath, swelling in his extremities.  Reports that his mood is basically the same.  Is not on any SSRI at this time because unable to tolerate the nausea.  Reports he did try the Zoloft for full 10 days and it got no better throughout the timeframe.    Past Medical History:  Diagnosis Date  . Anemia   . Anxiety   . Arthritis   . Cancer [facility])    bladder, ureter, Bil kidneys  . Dermatitis    scaly bump occurs every few months, pt uses cortizone cream and it goes away  . ESRD (end stage renal disease) on dialysis ([place])    Bilateral Nephrectomies- due to cancer  . GERD (gastroesophageal reflux disease) 04/07/2015  . History of blood transfusion   . Hypertension   .  Lumbar back pain   . Mental disorder   . Pleural effusion, right    s/p right thoracentesis 10/07/15 (no malignancy by cytology report)  . Shortness of breath dyspnea    Lying down gets short of breath    Past Surgical History:  Procedure Laterality Date  . AV FISTULA PLACEMENT Right 06/05/2015   Procedure: ARTERIOVENOUS (AV) FISTULA CREATION RIGHT ARM;  Surgeon: Angelia Mould, MD;  Location: [place];  Service: Vascular;  Laterality: Right;  . BLADDER SURGERY  04-06-2015   removal of bladder/ [facility]   . CAPD INSERTION N/A 06/24/2016   Procedure: LAPAROSCOPIC INSERTION CONTINUOUS AMBULATORY PERITONEAL DIALYSIS  (CAPD) CATHETER;  Surgeon: Clovis Riley, MD;  Location: [place];  Service: General;  Laterality: N/A;  . CARPAL TUNNEL RELEASE     left hand x 2; right hand x 1; right elbow  . CERVICAL FUSION     x 2  . EYE SURGERY Bilateral    Cataract removal  . FISTULA SUPERFICIALIZATION Right 10/30/2015   Procedure: SUPERFICIALIZATION BRACHIOCEPHALIC ARTERIOVENOUS FISTULA Right Arm;  Surgeon: Angelia Mould, MD;  Location: [place];  Service: Vascular;  Laterality: Right;  . Hemocatheter    . IR GENERIC  HISTORICAL Right 04/29/2016   IR THROMBECTOMY AV FISTULA W/THROMBOLYSIS/PTA INC/SHUNT/IMG RIGHT 04/29/2016 Arne Cleveland, MD [facility]  . IR GENERIC HISTORICAL  04/29/2016   IR US GUIDE VASC ACCESS RIGHT 04/29/2016 Arne Cleveland, MD [facility]  . IR THORACENTESIS ASP PLEURAL SPACE W/IMG GUIDE  12/06/2016  . KNEE ARTHROSCOPY Bilateral    bilateral  . KNEE ARTHROSCOPY WITH MEDIAL MENISECTOMY Left 02/26/2013   Procedure: KNEE ARTHROSCOPY WITH MEDIAL MENISECTOMY;  Surgeon: Garald Balding, MD;  Location: [place];  Service: Orthopedics;  Laterality: Left;  . LIGATION OF COMPETING BRANCHES OF ARTERIOVENOUS FISTULA Right 10/30/2015   Procedure: LIGATION OF COMPETING BRANCHES OF BRACHIOCEPHALIC ARTERIOVENOUS FISTULA;  Surgeon: Angelia Mould, MD;  Location: [place];   Service: Vascular;  Laterality: Right;  . NEPHRECTOMY Right September 12, 2012  . NEPHRECTOMY Left 04-06-2015   [place] Right 09/07/2015   Procedure: Fistulagram;  Surgeon: Angelia Mould, MD;  Location: [place] CV LAB;  Service: Cardiovascular;  Laterality: Right;  ARM  . PERIPHERAL VASCULAR CATHETERIZATION Right 09/07/2015   Procedure: Peripheral Vascular Balloon Angioplasty;  Surgeon: Angelia Mould, MD;  Location: [place] CV LAB;  Service: Cardiovascular;  Laterality: Right;  upper arm venous  . PROSTATECTOMY  04-06-2015   [place] Bilateral    bilateral  . TOTAL KNEE ARTHROPLASTY  01/24/2012   Procedure: TOTAL KNEE ARTHROPLASTY;  Surgeon: Garald Balding, MD;  Location: [place];  Service: Orthopedics;  Laterality: Right;  RIGHT TOTAL KNEE REPLACEMENT    Family History  Problem Relation Age of Onset  . Cancer Mother   . Hypertension Father   . Atrial fibrillation Father   . Heart disease Father      Social History   Socioeconomic History  . Marital status: Widowed    Spouse name: None  . Number of children: None  . Years of education: None  . Highest education level: None  Social Needs  . Financial resource strain: None  . Food insecurity - worry: None  . Food insecurity - inability: None  . Transportation needs - medical: None  . Transportation needs - non-medical: None  Occupational History  . None  Tobacco Use  . Smoking status: Never Smoker  . Smokeless tobacco: Never Used  Substance and Sexual Activity  . Alcohol use: No    Alcohol/week: 0.0 oz  . Drug use: No  . Sexual activity: None  Other Topics Concern  . None  Social History Narrative   Lives alone. Manages well per patient report. Does not cook much. Eats out a lot. Eats all food groups. Does not eat much fruit. Married in the past. Has one son. Used to drive a truck.    Current  Outpatient Medications on File Prior to Visit  Medication Sig Dispense Refill  . amLODipine (NORVASC) 10 MG tablet     . clobetasol cream (TEMOVATE) 3.53 % Apply 1 application topically 2 (two) times daily.    . fluticasone (FLONASE) 50 MCG/ACT nasal spray Place 1 spray into both nostrils daily.     Marland Kitchen LORazepam (ATIVAN) 1 MG tablet Take 1 tablet (1 mg total) by mouth 2 (two) times daily as needed for anxiety. 60 tablet 0  . metoprolol tartrate (LOPRESSOR) 25 MG tablet Take 37.5 mg by mouth 2 (two) times daily.    [name] Glycol-Propyl Glycol (SYSTANE OP) Place 1 drop into both eyes daily as needed (for  dry, itchy eyes).    . sertraline (ZOLOFT) 50 MG tablet Take 1 tablet (50 mg total) by mouth daily. 30 tablet 0   No current facility-administered medications on file prior to visit.     Review of Systems  Constitutional: Negative for activity change, chills, diaphoresis and unexpected weight change.  Respiratory: Negative for cough, chest tightness and wheezing.   Cardiovascular: Negative for chest pain, palpitations and leg swelling.  Gastrointestinal: Negative for abdominal pain, diarrhea and vomiting.  Skin: Negative for rash.  Psychiatric/Behavioral: Positive for dysphoric mood and sleep disturbance. Negative for suicidal ideas. The patient is nervous/anxious.      Objective:   BP (!) 142/68 (BP Location: Left Arm, Patient Position: Sitting, Cuff Size: Normal)   Pulse 66   Temp 97.9 F (36.6 C) (Temporal)   Resp 16   Ht 5\' 10"  (1.778 m)   Wt 249 lb 4 oz (113.1 kg)   SpO2 95%   BMI 35.76 kg/m  Recheck of blood pressure by myself revealed blood pressure 138/70.  Blood pressure check with patient machine 140/70. Physical Exam  Constitutional: He is oriented to person, place, and time. He appears well-developed and well-nourished.  HENT:  Head: Normocephalic and atraumatic.  Cardiovascular: Normal rate, regular rhythm and normal heart sounds.  Pulmonary/Chest: Effort  normal and breath sounds normal.  Musculoskeletal: Normal range of motion.  Neurological: He is alert and oriented to person, place, and time.  Skin: Skin is warm and dry.  Psychiatric: His behavior is normal. Judgment and thought content normal.  Vitals reviewed.    Assessment and Plan   1. Screen for colon cancer Discussed with patient today the need for colon cancer screening.  He is in agreement.  He has not had a history of colon polyps in the past.  He has no family history of colon cancer.  He is low risk for colon cancer screening and therefore proceed with color guard at this time.  He was given information and form signed today in the office. - Cologuard Questions answered today. 2. Screening for HIV (human immunodeficiency virus) - HIV antibody (with reflex)  3. Encounter for hepatitis C screening test for low risk patient - Hepatitis C antibody Counseled patient regarding screening and the reason for this test.  He voiced understanding 4. Essential hypertension Discussed with patient that I do believe his blood pressure is under good control at this time.  I would not like to lower his blood pressure anymore due to some irregularity and fluid shifts from time to time with his dialysis.  He will continue to monitor his blood pressure at home and will call if his readings are consistently above 150/90.  He will continue his medications as directed.  Call with questions or concerns. 5.  Depression and anxiety  He did agree to keep his scheduled follow-up with Dr. Modesta Messing.  He does report he is going to discontinue the Xanax and try the lorazepam that she gave him.  I discussed this medication with him today and explained why this could possibly help him more than the Xanax.  He voiced understanding.  I asked him to please keep an open mind and try additional medications to help with his mood. I did discuss with patient that if he developed worsening of his mood or any suicidal  thoughts or ideations that he needed to call our office or call 911.  He did voice understanding. Return in about 3 months (around 07/02/2017) for follow up. Apolonio Schneiders  Mannie Stabile, MD 04/04/2017

## 2017-04-05 DIAGNOSIS — N186 End stage renal disease: Secondary | ICD-10-CM | POA: Diagnosis not present

## 2017-04-05 DIAGNOSIS — Z992 Dependence on renal dialysis: Secondary | ICD-10-CM | POA: Diagnosis not present

## 2017-04-05 DIAGNOSIS — D509 Iron deficiency anemia, unspecified: Secondary | ICD-10-CM | POA: Diagnosis not present

## 2017-04-05 DIAGNOSIS — D631 Anemia in chronic kidney disease: Secondary | ICD-10-CM | POA: Diagnosis not present

## 2017-04-05 LAB — HIV ANTIBODY (ROUTINE TESTING W REFLEX): HIV 1&2 Ab, 4th Generation: NONREACTIVE

## 2017-04-05 LAB — HEPATITIS C ANTIBODY
Hepatitis C Ab: NONREACTIVE
SIGNAL TO CUT-OFF: 0.01 (ref <1.00)

## 2017-04-06 ENCOUNTER — Encounter: Payer: Self-pay | Admitting: Family Medicine

## 2017-04-06 DIAGNOSIS — D509 Iron deficiency anemia, unspecified: Secondary | ICD-10-CM | POA: Diagnosis not present

## 2017-04-06 DIAGNOSIS — Z992 Dependence on renal dialysis: Secondary | ICD-10-CM | POA: Diagnosis not present

## 2017-04-06 DIAGNOSIS — D631 Anemia in chronic kidney disease: Secondary | ICD-10-CM | POA: Diagnosis not present

## 2017-04-06 DIAGNOSIS — N186 End stage renal disease: Secondary | ICD-10-CM | POA: Diagnosis not present

## 2017-04-07 DIAGNOSIS — D509 Iron deficiency anemia, unspecified: Secondary | ICD-10-CM | POA: Diagnosis not present

## 2017-04-07 DIAGNOSIS — D631 Anemia in chronic kidney disease: Secondary | ICD-10-CM | POA: Diagnosis not present

## 2017-04-07 DIAGNOSIS — Z992 Dependence on renal dialysis: Secondary | ICD-10-CM | POA: Diagnosis not present

## 2017-04-07 DIAGNOSIS — N186 End stage renal disease: Secondary | ICD-10-CM | POA: Diagnosis not present

## 2017-04-08 DIAGNOSIS — D509 Iron deficiency anemia, unspecified: Secondary | ICD-10-CM | POA: Diagnosis not present

## 2017-04-08 DIAGNOSIS — D631 Anemia in chronic kidney disease: Secondary | ICD-10-CM | POA: Diagnosis not present

## 2017-04-08 DIAGNOSIS — Z992 Dependence on renal dialysis: Secondary | ICD-10-CM | POA: Diagnosis not present

## 2017-04-08 DIAGNOSIS — N186 End stage renal disease: Secondary | ICD-10-CM | POA: Diagnosis not present

## 2017-04-09 DIAGNOSIS — N186 End stage renal disease: Secondary | ICD-10-CM | POA: Diagnosis not present

## 2017-04-09 DIAGNOSIS — D509 Iron deficiency anemia, unspecified: Secondary | ICD-10-CM | POA: Diagnosis not present

## 2017-04-09 DIAGNOSIS — Z992 Dependence on renal dialysis: Secondary | ICD-10-CM | POA: Diagnosis not present

## 2017-04-09 DIAGNOSIS — D631 Anemia in chronic kidney disease: Secondary | ICD-10-CM | POA: Diagnosis not present

## 2017-04-10 DIAGNOSIS — N186 End stage renal disease: Secondary | ICD-10-CM | POA: Diagnosis not present

## 2017-04-10 DIAGNOSIS — Z992 Dependence on renal dialysis: Secondary | ICD-10-CM | POA: Diagnosis not present

## 2017-04-10 DIAGNOSIS — D509 Iron deficiency anemia, unspecified: Secondary | ICD-10-CM | POA: Diagnosis not present

## 2017-04-10 DIAGNOSIS — D631 Anemia in chronic kidney disease: Secondary | ICD-10-CM | POA: Diagnosis not present

## 2017-04-11 DIAGNOSIS — D631 Anemia in chronic kidney disease: Secondary | ICD-10-CM | POA: Diagnosis not present

## 2017-04-11 DIAGNOSIS — D509 Iron deficiency anemia, unspecified: Secondary | ICD-10-CM | POA: Diagnosis not present

## 2017-04-11 DIAGNOSIS — N186 End stage renal disease: Secondary | ICD-10-CM | POA: Diagnosis not present

## 2017-04-11 DIAGNOSIS — Z992 Dependence on renal dialysis: Secondary | ICD-10-CM | POA: Diagnosis not present

## 2017-04-11 NOTE — Progress Notes (Signed)
BH MD/PA/NP OP Progress Note  04/14/2017 11:26 AM Jesus [name]  MRN:  [id number]  Chief Complaint:  Chief Complaint    Follow-up; Depression     HPI:  Patient presents for follow-up appointment for depression.  He states that he discontinued sertraline after trying for 10 days due to abdominal pain. He "feels awful" and reports that he does not feel like doing anything.  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 He has passive SI. Although he used to make breakfast, he has not done it for a week. He lays in the bed most of the time or watches TV. He agrees that he may make himself to take a walk every day.  He endorses insomnia.  He has decreased appetite.  He has fair concentration.  He reports mild anxiety.  He has started to take Ativan a week ago; he takes it once a day.  He continued Xanax with the thought that it would help more before starting Ativan.  He agrees to try Ativan at this time.  He denies panic attacks.    Wt Readings from Last 3 Encounters:  04/14/17 239 lb (108.4 kg)  04/04/17 249 lb 4 oz (113.1 kg)  03/14/17 247 lb (112 kg)  Wt 236 lb on 12/02/2016  Per PMP,  ativan filled on 03/16/2017 Xanax filled on 03/21/2017  Visit Diagnosis:    ICD-10-CM   1. Current moderate episode of major depressive disorder without prior episode ([place]) F32.1     Past Psychiatric History:  I have reviewed the patient's psychiatry history in detail and updated the patient record. Outpatient: denies Psychiatry admission: denies Previous suicide attempt: denies Past trials of medication: citalopram (GI symptoms), Xanax History of violence: denies    Past Medical History:  Past Medical History:  Diagnosis Date  . Anemia   . Anxiety   . Arthritis   . Cancer [facility])    bladder, ureter, Bil kidneys  . Dermatitis    scaly bump occurs every few months, pt uses cortizone cream and it goes away  . ESRD (end stage renal disease) on  dialysis ([place])    Bilateral Nephrectomies- due to cancer  . GERD (gastroesophageal reflux disease) 04/07/2015  . History of blood transfusion   . Hypertension   . Lumbar back pain   . Mental disorder   . Pleural effusion, right    s/p right thoracentesis 10/07/15 (no malignancy by cytology report)  . Shortness of breath dyspnea    Lying down gets short of breath    Past Surgical History:  Procedure Laterality Date  . AV FISTULA PLACEMENT Right 06/05/2015   Procedure: ARTERIOVENOUS (AV) FISTULA CREATION RIGHT ARM;  Surgeon: Angelia Mould, MD;  Location: [place];  Service: Vascular;  Laterality: Right;  . BLADDER SURGERY  04-06-2015   removal of bladder/ [facility]   . CAPD INSERTION N/A 06/24/2016   Procedure: LAPAROSCOPIC INSERTION CONTINUOUS AMBULATORY PERITONEAL DIALYSIS  (CAPD) CATHETER;  Surgeon: Clovis Riley, MD;  Location: [place];  Service: General;  Laterality: N/A;  . CARPAL TUNNEL RELEASE     left hand x 2; right hand x 1; right elbow  . CERVICAL FUSION     x 2  . EYE SURGERY Bilateral    Cataract removal  . FISTULA SUPERFICIALIZATION Right 10/30/2015   Procedure: SUPERFICIALIZATION BRACHIOCEPHALIC ARTERIOVENOUS FISTULA Right Arm;  Surgeon: Angelia Mould, MD;  Location: [place];  Service: Vascular;  Laterality: Right;  . Hemocatheter    . IR GENERIC HISTORICAL Right 04/29/2016   IR THROMBECTOMY AV FISTULA W/THROMBOLYSIS/PTA INC/SHUNT/IMG RIGHT 04/29/2016 Arne [place], MD [facility]  . IR GENERIC HISTORICAL  04/29/2016   IR US GUIDE VASC ACCESS RIGHT 04/29/2016 Arne [place], MD [facility]  . IR THORACENTESIS ASP PLEURAL SPACE W/IMG GUIDE  12/06/2016  . KNEE ARTHROSCOPY Bilateral    bilateral  . KNEE ARTHROSCOPY WITH MEDIAL MENISECTOMY Left 02/26/2013   Procedure: KNEE ARTHROSCOPY WITH MEDIAL MENISECTOMY;  Surgeon: Garald Balding, MD;  Location: [place];  Service: Orthopedics;  Laterality: Left;  . LIGATION OF COMPETING BRANCHES OF ARTERIOVENOUS  FISTULA Right 10/30/2015   Procedure: LIGATION OF COMPETING BRANCHES OF BRACHIOCEPHALIC ARTERIOVENOUS FISTULA;  Surgeon: Angelia Mould, MD;  Location: [place];  Service: Vascular;  Laterality: Right;  . NEPHRECTOMY Right September 12, 2012  . NEPHRECTOMY Left 04-06-2015   [place] Right 09/07/2015   Procedure: Fistulagram;  Surgeon: Angelia Mould, MD;  Location: [place] [facility];  Service: Cardiovascular;  Laterality: Right;  ARM  . PERIPHERAL VASCULAR CATHETERIZATION Right 09/07/2015   Procedure: Peripheral Vascular Balloon Angioplasty;  Surgeon: Angelia Mould, MD;  Location: [place] [facility];  Service: Cardiovascular;  Laterality: Right;  upper arm venous  . PROSTATECTOMY  04-06-2015   [place] Bilateral    bilateral  . TOTAL KNEE ARTHROPLASTY  01/24/2012   Procedure: TOTAL KNEE ARTHROPLASTY;  Surgeon: Garald Balding, MD;  Location: [place];  Service: Orthopedics;  Laterality: Right;  RIGHT TOTAL KNEE REPLACEMENT    Family Psychiatric History: I have reviewed the patient's family history in detail and updated the patient record.  Family History:  Family History  Problem Relation Age of Onset  . Cancer Mother   . Hypertension Father   . Atrial fibrillation Father   . Heart disease Father     Social History:  Social History   Socioeconomic History  . Marital status: Widowed    Spouse name: None  . Number of children: None  . Years of education: None  . Highest education level: None  Social Needs  . Financial resource strain: None  . Food insecurity - worry: None  . Food insecurity - inability: None  . Transportation needs - medical: None  . Transportation needs - non-medical: None  Occupational History  . None  Tobacco Use  . Smoking status: Never Smoker  . Smokeless tobacco: Never Used  Substance and Sexual Activity  . Alcohol use: No     Alcohol/week: 0.0 oz  . Drug use: No  . Sexual activity: None  Other Topics Concern  . None  Social History Narrative   Lives alone. Manages well per patient report. Does not cook much. Eats out a lot. Eats all food groups. Does not eat much fruit. Married in the past. Has one son. Used to drive a truck.    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 "okay" "never close with sisters"   Allergies: No Known Allergies  Metabolic Disorder Labs: No results found for: HGBA1C, MPG No results found for: PROLACTIN No results found for: CHOL, TRIG, HDL, CHOLHDL, VLDL, LDLCALC Lab Results  Component Value Date   TSH 3.97 03/14/2017  Therapeutic Level Labs: No results found for: LITHIUM No results found for: VALPROATE No components found for:  CBMZ  Current Medications: Current Outpatient Medications  Medication Sig Dispense Refill  . amLODipine (NORVASC) 10 MG tablet     . clobetasol cream (TEMOVATE) 0.63 % Apply 1 application topically 2 (two) times daily.    . fluticasone (FLONASE) 50 MCG/ACT nasal spray Place 1 spray into both nostrils daily.     Marland Kitchen LORazepam (ATIVAN) 1 MG tablet Take 1 tablet (1 mg total) by mouth 2 (two) times daily as needed for anxiety. 60 tablet 0  . metoprolol tartrate (LOPRESSOR) 25 MG tablet Take 37.5 mg by mouth 2 (two) times daily.    . mirtazapine (REMERON) 15 MG tablet Take 1 tablet (15 mg total) by mouth at bedtime. 30 tablet 0  . Polyethyl Glycol-Propyl Glycol (SYSTANE OP) Place 1 drop into both eyes daily as needed (for dry, itchy eyes).    . sertraline (ZOLOFT) 50 MG tablet Take 1 tablet (50 mg total) by mouth daily. 30 tablet 0   No current facility-administered medications for this visit.      Musculoskeletal: Strength & Muscle Tone: within normal limits Gait & Station: normal Patient  leans: N/A  Psychiatric Specialty Exam: Review of Systems  Psychiatric/Behavioral: Positive for depression and suicidal ideas. Negative for hallucinations, memory loss and substance abuse. The patient is nervous/anxious and has insomnia.   All other systems reviewed and are negative.   Blood pressure (!) 170/79, pulse 60, height 5\' 10"  (1.778 m), weight 239 lb (108.4 kg), SpO2 95 %.Body mass index is 34.29 kg/m.  General Appearance: Fairly Groomed  Eye Contact:  Good  Speech:  Clear and Coherent  Volume:  Normal  Mood:  Depressed  Affect:  Appropriate, Congruent, Restricted and irritable, down, but calm  Thought Process:  Coherent and Goal Directed  Orientation:  Full (Time, Place, and Person)  Thought Content: Logical   Suicidal Thoughts:  Yes.  without intent/plan  Homicidal Thoughts:  No  Memory:  Immediate;   Good Recent;   Good Remote;   Good  Judgement:  Good  Insight:  Present  Psychomotor Activity:  Normal  Concentration:  Concentration: Good and Attention Span: Good  Recall:  Good  Fund of Knowledge: Good  Language: Good  Akathisia:  No  Handed:  Right  AIMS (if indicated): not done  Assets:  Communication Skills Desire for Improvement  ADL's:  Intact  Cognition: WNL  Sleep:  Poor   Screenings: PHQ2-9     Office Visit from 03/07/2017 in [facility] Office Visit from 11/02/2016 in [facility]  PHQ-2 Total Score  6  0  PHQ-9 Total Score  18  No data       Assessment and Plan:  Jesus [name] is a 67 y.o. year old male with a history of depression, hypertension, , ESRD s/p bilateral nephrectomies for renal cell carcinoma, bladder cancer, Cervical spondylosis without myelopathy, anemia , who presents for follow up appointment for Current moderate episode of major depressive disorder without prior episode ([place])  # MDD, moderate, single episode without psychotic features # Unspecified anxiety disorder # r/o MDD due to another medical  condition Exam is notable for restricted affect and patient endorses neurovegetative symptoms with significant anhedonia. Psychosocial stressors including chronic pain, being on dialysis, having underwent surgeries for cancer and grief of loss of his wife. Medical factors contributing to his symptoms are anemia, electrolyte abnormality (hyponatremia, anemia).  He could  not tolerate sertraline due to GI symptoms.  We will start mirtazapine to target neurovegetative symptoms and insomnia, appetite loss.  We will continue Ativan for anxiety.  Discussed risk of dependence and oversedation.  Spent significant time on behavioral activation.  He will greatly benefit from supportive therapy and CBT.  We will make a referral.   Plan (Discontinue sertraline) 1. Start mirtazapine 15 mg at night  2. Continue ativan 1 mg daily as needed for anxiety  3. Return to clinic in one month for 30 mins 4. Try to take a walk five minutes every day 5. Referral to therapy  The patient demonstrates the following risk factors for suicide: Chronic risk factors for suicide include: psychiatric disorder of depression, anxiety and chronic pain. Acute risk factors for suicide include: unemployment and social withdrawal/isolation. Protective factors for this patient include: coping skills and hope for the future. Considering these factors, the overall suicide risk at this point appears to be low. Patient is appropriate for outpatient follow up.  The duration of this appointment visit was 30 minutes of face-to-face time with the patient.  Greater than 50% of this time was spent in counseling, explanation of  diagnosis, planning of further management, and coordination of care.  Norman Clay, MD 04/14/2017, 11:26 AM

## 2017-04-12 DIAGNOSIS — D509 Iron deficiency anemia, unspecified: Secondary | ICD-10-CM | POA: Diagnosis not present

## 2017-04-12 DIAGNOSIS — N186 End stage renal disease: Secondary | ICD-10-CM | POA: Diagnosis not present

## 2017-04-12 DIAGNOSIS — D631 Anemia in chronic kidney disease: Secondary | ICD-10-CM | POA: Diagnosis not present

## 2017-04-12 DIAGNOSIS — Z992 Dependence on renal dialysis: Secondary | ICD-10-CM | POA: Diagnosis not present

## 2017-04-13 DIAGNOSIS — D509 Iron deficiency anemia, unspecified: Secondary | ICD-10-CM | POA: Diagnosis not present

## 2017-04-13 DIAGNOSIS — N186 End stage renal disease: Secondary | ICD-10-CM | POA: Diagnosis not present

## 2017-04-13 DIAGNOSIS — Z992 Dependence on renal dialysis: Secondary | ICD-10-CM | POA: Diagnosis not present

## 2017-04-13 DIAGNOSIS — D631 Anemia in chronic kidney disease: Secondary | ICD-10-CM | POA: Diagnosis not present

## 2017-04-14 ENCOUNTER — Telehealth: Payer: Self-pay | Admitting: Family Medicine

## 2017-04-14 ENCOUNTER — Encounter (HOSPITAL_COMMUNITY): Payer: Self-pay | Admitting: Psychiatry

## 2017-04-14 ENCOUNTER — Ambulatory Visit (INDEPENDENT_AMBULATORY_CARE_PROVIDER_SITE_OTHER): Payer: Medicare Other | Admitting: Psychiatry

## 2017-04-14 VITALS — BP 170/79 | HR 60 | Ht 70.0 in | Wt 239.0 lb

## 2017-04-14 DIAGNOSIS — N186 End stage renal disease: Secondary | ICD-10-CM | POA: Diagnosis not present

## 2017-04-14 DIAGNOSIS — Z79899 Other long term (current) drug therapy: Secondary | ICD-10-CM

## 2017-04-14 DIAGNOSIS — F321 Major depressive disorder, single episode, moderate: Secondary | ICD-10-CM

## 2017-04-14 DIAGNOSIS — Z85528 Personal history of other malignant neoplasm of kidney: Secondary | ICD-10-CM | POA: Diagnosis not present

## 2017-04-14 DIAGNOSIS — F419 Anxiety disorder, unspecified: Secondary | ICD-10-CM

## 2017-04-14 DIAGNOSIS — Z992 Dependence on renal dialysis: Secondary | ICD-10-CM

## 2017-04-14 DIAGNOSIS — D631 Anemia in chronic kidney disease: Secondary | ICD-10-CM

## 2017-04-14 DIAGNOSIS — I120 Hypertensive chronic kidney disease with stage 5 chronic kidney disease or end stage renal disease: Secondary | ICD-10-CM

## 2017-04-14 DIAGNOSIS — Z8551 Personal history of malignant neoplasm of bladder: Secondary | ICD-10-CM | POA: Diagnosis not present

## 2017-04-14 DIAGNOSIS — D509 Iron deficiency anemia, unspecified: Secondary | ICD-10-CM | POA: Diagnosis not present

## 2017-04-14 DIAGNOSIS — M47892 Other spondylosis, cervical region: Secondary | ICD-10-CM | POA: Diagnosis not present

## 2017-04-14 DIAGNOSIS — G8929 Other chronic pain: Secondary | ICD-10-CM

## 2017-04-14 MED ORDER — MIRTAZAPINE 15 MG PO TABS: 15.0000 mg | ORAL_TABLET | Freq: Every day | ORAL | 0 refills | Status: DC

## 2017-04-14 NOTE — Telephone Encounter (Signed)
Faxed

## 2017-04-14 NOTE — Telephone Encounter (Signed)
Please get his last labs from nephrology. Need copy.

## 2017-04-14 NOTE — Patient Instructions (Addendum)
1. Start mirtazapine 15 mg at night  2. Continue ativan 1 mg daily as needed for anxiety  3. Return to clinic in one month for 30 mins 4. Try to take a walk five minutes every day 5. Referral to therapy

## 2017-04-15 DIAGNOSIS — D631 Anemia in chronic kidney disease: Secondary | ICD-10-CM | POA: Diagnosis not present

## 2017-04-15 DIAGNOSIS — Z992 Dependence on renal dialysis: Secondary | ICD-10-CM | POA: Diagnosis not present

## 2017-04-15 DIAGNOSIS — N186 End stage renal disease: Secondary | ICD-10-CM | POA: Diagnosis not present

## 2017-04-15 DIAGNOSIS — D509 Iron deficiency anemia, unspecified: Secondary | ICD-10-CM | POA: Diagnosis not present

## 2017-04-16 DIAGNOSIS — D509 Iron deficiency anemia, unspecified: Secondary | ICD-10-CM | POA: Diagnosis not present

## 2017-04-16 DIAGNOSIS — D631 Anemia in chronic kidney disease: Secondary | ICD-10-CM | POA: Diagnosis not present

## 2017-04-16 DIAGNOSIS — N186 End stage renal disease: Secondary | ICD-10-CM | POA: Diagnosis not present

## 2017-04-16 DIAGNOSIS — Z992 Dependence on renal dialysis: Secondary | ICD-10-CM | POA: Diagnosis not present

## 2017-04-17 DIAGNOSIS — D509 Iron deficiency anemia, unspecified: Secondary | ICD-10-CM | POA: Diagnosis not present

## 2017-04-17 DIAGNOSIS — N186 End stage renal disease: Secondary | ICD-10-CM | POA: Diagnosis not present

## 2017-04-17 DIAGNOSIS — D631 Anemia in chronic kidney disease: Secondary | ICD-10-CM | POA: Diagnosis not present

## 2017-04-17 DIAGNOSIS — Z992 Dependence on renal dialysis: Secondary | ICD-10-CM | POA: Diagnosis not present

## 2017-04-18 DIAGNOSIS — N186 End stage renal disease: Secondary | ICD-10-CM | POA: Diagnosis not present

## 2017-04-18 DIAGNOSIS — D631 Anemia in chronic kidney disease: Secondary | ICD-10-CM | POA: Diagnosis not present

## 2017-04-18 DIAGNOSIS — Z992 Dependence on renal dialysis: Secondary | ICD-10-CM | POA: Diagnosis not present

## 2017-04-18 DIAGNOSIS — D509 Iron deficiency anemia, unspecified: Secondary | ICD-10-CM | POA: Diagnosis not present

## 2017-04-19 DIAGNOSIS — D509 Iron deficiency anemia, unspecified: Secondary | ICD-10-CM | POA: Diagnosis not present

## 2017-04-19 DIAGNOSIS — Z992 Dependence on renal dialysis: Secondary | ICD-10-CM | POA: Diagnosis not present

## 2017-04-19 DIAGNOSIS — D631 Anemia in chronic kidney disease: Secondary | ICD-10-CM | POA: Diagnosis not present

## 2017-04-19 DIAGNOSIS — N186 End stage renal disease: Secondary | ICD-10-CM | POA: Diagnosis not present

## 2017-04-20 DIAGNOSIS — D631 Anemia in chronic kidney disease: Secondary | ICD-10-CM | POA: Diagnosis not present

## 2017-04-20 DIAGNOSIS — Z1211 Encounter for screening for malignant neoplasm of colon: Secondary | ICD-10-CM | POA: Diagnosis not present

## 2017-04-20 DIAGNOSIS — Z1212 Encounter for screening for malignant neoplasm of rectum: Secondary | ICD-10-CM | POA: Diagnosis not present

## 2017-04-20 DIAGNOSIS — Z992 Dependence on renal dialysis: Secondary | ICD-10-CM | POA: Diagnosis not present

## 2017-04-20 DIAGNOSIS — D509 Iron deficiency anemia, unspecified: Secondary | ICD-10-CM | POA: Diagnosis not present

## 2017-04-20 DIAGNOSIS — N186 End stage renal disease: Secondary | ICD-10-CM | POA: Diagnosis not present

## 2017-04-20 LAB — COLOGUARD: Cologuard: NEGATIVE

## 2017-04-21 DIAGNOSIS — Z992 Dependence on renal dialysis: Secondary | ICD-10-CM | POA: Diagnosis not present

## 2017-04-21 DIAGNOSIS — D631 Anemia in chronic kidney disease: Secondary | ICD-10-CM | POA: Diagnosis not present

## 2017-04-21 DIAGNOSIS — N186 End stage renal disease: Secondary | ICD-10-CM | POA: Diagnosis not present

## 2017-04-21 DIAGNOSIS — D509 Iron deficiency anemia, unspecified: Secondary | ICD-10-CM | POA: Diagnosis not present

## 2017-04-22 DIAGNOSIS — Z992 Dependence on renal dialysis: Secondary | ICD-10-CM | POA: Diagnosis not present

## 2017-04-22 DIAGNOSIS — D631 Anemia in chronic kidney disease: Secondary | ICD-10-CM | POA: Diagnosis not present

## 2017-04-22 DIAGNOSIS — D509 Iron deficiency anemia, unspecified: Secondary | ICD-10-CM | POA: Diagnosis not present

## 2017-04-22 DIAGNOSIS — N186 End stage renal disease: Secondary | ICD-10-CM | POA: Diagnosis not present

## 2017-04-23 DIAGNOSIS — Z992 Dependence on renal dialysis: Secondary | ICD-10-CM | POA: Diagnosis not present

## 2017-04-23 DIAGNOSIS — D631 Anemia in chronic kidney disease: Secondary | ICD-10-CM | POA: Diagnosis not present

## 2017-04-23 DIAGNOSIS — D509 Iron deficiency anemia, unspecified: Secondary | ICD-10-CM | POA: Diagnosis not present

## 2017-04-23 DIAGNOSIS — N186 End stage renal disease: Secondary | ICD-10-CM | POA: Diagnosis not present

## 2017-04-24 DIAGNOSIS — N186 End stage renal disease: Secondary | ICD-10-CM | POA: Diagnosis not present

## 2017-04-24 DIAGNOSIS — D631 Anemia in chronic kidney disease: Secondary | ICD-10-CM | POA: Diagnosis not present

## 2017-04-24 DIAGNOSIS — Z992 Dependence on renal dialysis: Secondary | ICD-10-CM | POA: Diagnosis not present

## 2017-04-24 DIAGNOSIS — D509 Iron deficiency anemia, unspecified: Secondary | ICD-10-CM | POA: Diagnosis not present

## 2017-04-25 DIAGNOSIS — N186 End stage renal disease: Secondary | ICD-10-CM | POA: Diagnosis not present

## 2017-04-25 DIAGNOSIS — D509 Iron deficiency anemia, unspecified: Secondary | ICD-10-CM | POA: Diagnosis not present

## 2017-04-25 DIAGNOSIS — Z992 Dependence on renal dialysis: Secondary | ICD-10-CM | POA: Diagnosis not present

## 2017-04-25 DIAGNOSIS — D631 Anemia in chronic kidney disease: Secondary | ICD-10-CM | POA: Diagnosis not present

## 2017-04-26 DIAGNOSIS — Z992 Dependence on renal dialysis: Secondary | ICD-10-CM | POA: Diagnosis not present

## 2017-04-26 DIAGNOSIS — D631 Anemia in chronic kidney disease: Secondary | ICD-10-CM | POA: Diagnosis not present

## 2017-04-26 DIAGNOSIS — N186 End stage renal disease: Secondary | ICD-10-CM | POA: Diagnosis not present

## 2017-04-26 DIAGNOSIS — D509 Iron deficiency anemia, unspecified: Secondary | ICD-10-CM | POA: Diagnosis not present

## 2017-04-27 DIAGNOSIS — N186 End stage renal disease: Secondary | ICD-10-CM | POA: Diagnosis not present

## 2017-04-27 DIAGNOSIS — D509 Iron deficiency anemia, unspecified: Secondary | ICD-10-CM | POA: Diagnosis not present

## 2017-04-27 DIAGNOSIS — Z992 Dependence on renal dialysis: Secondary | ICD-10-CM | POA: Diagnosis not present

## 2017-04-27 DIAGNOSIS — D631 Anemia in chronic kidney disease: Secondary | ICD-10-CM | POA: Diagnosis not present

## 2017-04-28 DIAGNOSIS — D631 Anemia in chronic kidney disease: Secondary | ICD-10-CM | POA: Diagnosis not present

## 2017-04-28 DIAGNOSIS — D509 Iron deficiency anemia, unspecified: Secondary | ICD-10-CM | POA: Diagnosis not present

## 2017-04-28 DIAGNOSIS — N186 End stage renal disease: Secondary | ICD-10-CM | POA: Diagnosis not present

## 2017-04-28 DIAGNOSIS — Z992 Dependence on renal dialysis: Secondary | ICD-10-CM | POA: Diagnosis not present

## 2017-04-29 DIAGNOSIS — D631 Anemia in chronic kidney disease: Secondary | ICD-10-CM | POA: Diagnosis not present

## 2017-04-29 DIAGNOSIS — N186 End stage renal disease: Secondary | ICD-10-CM | POA: Diagnosis not present

## 2017-04-29 DIAGNOSIS — D509 Iron deficiency anemia, unspecified: Secondary | ICD-10-CM | POA: Diagnosis not present

## 2017-04-29 DIAGNOSIS — Z992 Dependence on renal dialysis: Secondary | ICD-10-CM | POA: Diagnosis not present

## 2017-04-30 DIAGNOSIS — D509 Iron deficiency anemia, unspecified: Secondary | ICD-10-CM | POA: Diagnosis not present

## 2017-04-30 DIAGNOSIS — D631 Anemia in chronic kidney disease: Secondary | ICD-10-CM | POA: Diagnosis not present

## 2017-04-30 DIAGNOSIS — N186 End stage renal disease: Secondary | ICD-10-CM | POA: Diagnosis not present

## 2017-04-30 DIAGNOSIS — Z992 Dependence on renal dialysis: Secondary | ICD-10-CM | POA: Diagnosis not present

## 2017-05-01 DIAGNOSIS — N186 End stage renal disease: Secondary | ICD-10-CM | POA: Diagnosis not present

## 2017-05-01 DIAGNOSIS — D509 Iron deficiency anemia, unspecified: Secondary | ICD-10-CM | POA: Diagnosis not present

## 2017-05-01 DIAGNOSIS — Z992 Dependence on renal dialysis: Secondary | ICD-10-CM | POA: Diagnosis not present

## 2017-05-01 DIAGNOSIS — D631 Anemia in chronic kidney disease: Secondary | ICD-10-CM | POA: Diagnosis not present

## 2017-05-02 DIAGNOSIS — N186 End stage renal disease: Secondary | ICD-10-CM | POA: Diagnosis not present

## 2017-05-02 DIAGNOSIS — D3131 Benign neoplasm of right choroid: Secondary | ICD-10-CM | POA: Diagnosis not present

## 2017-05-02 DIAGNOSIS — H04123 Dry eye syndrome of bilateral lacrimal glands: Secondary | ICD-10-CM | POA: Diagnosis not present

## 2017-05-02 DIAGNOSIS — H40013 Open angle with borderline findings, low risk, bilateral: Secondary | ICD-10-CM | POA: Diagnosis not present

## 2017-05-02 DIAGNOSIS — H35033 Hypertensive retinopathy, bilateral: Secondary | ICD-10-CM | POA: Diagnosis not present

## 2017-05-02 DIAGNOSIS — D509 Iron deficiency anemia, unspecified: Secondary | ICD-10-CM | POA: Diagnosis not present

## 2017-05-02 DIAGNOSIS — Z992 Dependence on renal dialysis: Secondary | ICD-10-CM | POA: Diagnosis not present

## 2017-05-02 DIAGNOSIS — D631 Anemia in chronic kidney disease: Secondary | ICD-10-CM | POA: Diagnosis not present

## 2017-05-03 DIAGNOSIS — Z992 Dependence on renal dialysis: Secondary | ICD-10-CM | POA: Diagnosis not present

## 2017-05-03 DIAGNOSIS — D509 Iron deficiency anemia, unspecified: Secondary | ICD-10-CM | POA: Diagnosis not present

## 2017-05-03 DIAGNOSIS — N186 End stage renal disease: Secondary | ICD-10-CM | POA: Diagnosis not present

## 2017-05-03 DIAGNOSIS — D631 Anemia in chronic kidney disease: Secondary | ICD-10-CM | POA: Diagnosis not present

## 2017-05-04 DIAGNOSIS — N186 End stage renal disease: Secondary | ICD-10-CM | POA: Diagnosis not present

## 2017-05-04 DIAGNOSIS — Z992 Dependence on renal dialysis: Secondary | ICD-10-CM | POA: Diagnosis not present

## 2017-05-04 DIAGNOSIS — D631 Anemia in chronic kidney disease: Secondary | ICD-10-CM | POA: Diagnosis not present

## 2017-05-04 DIAGNOSIS — D509 Iron deficiency anemia, unspecified: Secondary | ICD-10-CM | POA: Diagnosis not present

## 2017-05-05 DIAGNOSIS — N186 End stage renal disease: Secondary | ICD-10-CM | POA: Diagnosis not present

## 2017-05-05 DIAGNOSIS — D509 Iron deficiency anemia, unspecified: Secondary | ICD-10-CM | POA: Diagnosis not present

## 2017-05-05 DIAGNOSIS — Z992 Dependence on renal dialysis: Secondary | ICD-10-CM | POA: Diagnosis not present

## 2017-05-05 DIAGNOSIS — D631 Anemia in chronic kidney disease: Secondary | ICD-10-CM | POA: Diagnosis not present

## 2017-05-06 DIAGNOSIS — D631 Anemia in chronic kidney disease: Secondary | ICD-10-CM | POA: Diagnosis not present

## 2017-05-06 DIAGNOSIS — Z992 Dependence on renal dialysis: Secondary | ICD-10-CM | POA: Diagnosis not present

## 2017-05-06 DIAGNOSIS — D509 Iron deficiency anemia, unspecified: Secondary | ICD-10-CM | POA: Diagnosis not present

## 2017-05-06 DIAGNOSIS — N186 End stage renal disease: Secondary | ICD-10-CM | POA: Diagnosis not present

## 2017-05-07 DIAGNOSIS — N186 End stage renal disease: Secondary | ICD-10-CM | POA: Diagnosis not present

## 2017-05-07 DIAGNOSIS — D631 Anemia in chronic kidney disease: Secondary | ICD-10-CM | POA: Diagnosis not present

## 2017-05-07 DIAGNOSIS — Z992 Dependence on renal dialysis: Secondary | ICD-10-CM | POA: Diagnosis not present

## 2017-05-07 DIAGNOSIS — D509 Iron deficiency anemia, unspecified: Secondary | ICD-10-CM | POA: Diagnosis not present

## 2017-05-08 ENCOUNTER — Other Ambulatory Visit: Payer: Self-pay | Admitting: Thoracic Surgery (Cardiothoracic Vascular Surgery)

## 2017-05-08 DIAGNOSIS — D509 Iron deficiency anemia, unspecified: Secondary | ICD-10-CM | POA: Diagnosis not present

## 2017-05-08 DIAGNOSIS — Z992 Dependence on renal dialysis: Secondary | ICD-10-CM | POA: Diagnosis not present

## 2017-05-08 DIAGNOSIS — D631 Anemia in chronic kidney disease: Secondary | ICD-10-CM | POA: Diagnosis not present

## 2017-05-08 DIAGNOSIS — N186 End stage renal disease: Secondary | ICD-10-CM | POA: Diagnosis not present

## 2017-05-08 DIAGNOSIS — J90 Pleural effusion, not elsewhere classified: Secondary | ICD-10-CM

## 2017-05-09 ENCOUNTER — Ambulatory Visit
Admission: RE | Admit: 2017-05-09 | Discharge: 2017-05-09 | Disposition: A | Discharge status: Home / Self Care | Payer: Medicare Other | Source: Ambulatory Visit | Attending: Thoracic Surgery (Cardiothoracic Vascular Surgery) | Admitting: Thoracic Surgery (Cardiothoracic Vascular Surgery)

## 2017-05-09 ENCOUNTER — Ambulatory Visit (INDEPENDENT_AMBULATORY_CARE_PROVIDER_SITE_OTHER): Payer: Medicare Other | Admitting: Thoracic Surgery (Cardiothoracic Vascular Surgery)

## 2017-05-09 VITALS — BP 123/75 | HR 58 | Resp 20 | Ht 70.0 in | Wt 239.0 lb

## 2017-05-09 DIAGNOSIS — Z992 Dependence on renal dialysis: Secondary | ICD-10-CM | POA: Diagnosis not present

## 2017-05-09 DIAGNOSIS — J90 Pleural effusion, not elsewhere classified: Secondary | ICD-10-CM

## 2017-05-09 DIAGNOSIS — D631 Anemia in chronic kidney disease: Secondary | ICD-10-CM | POA: Diagnosis not present

## 2017-05-09 DIAGNOSIS — Z85528 Personal history of other malignant neoplasm of kidney: Secondary | ICD-10-CM | POA: Diagnosis not present

## 2017-05-09 DIAGNOSIS — N186 End stage renal disease: Secondary | ICD-10-CM | POA: Diagnosis not present

## 2017-05-09 DIAGNOSIS — D509 Iron deficiency anemia, unspecified: Secondary | ICD-10-CM | POA: Diagnosis not present

## 2017-05-09 NOTE — Progress Notes (Signed)
Jesus [name] [address]       [place] [address]             [phone]     HPI: [name] returns for follow-up regarding his right pleural effusion.  [name] is a 67 year old gentleman with a history of bilateral nephrectomies for renal cell carcinoma.  Most recent was a left nephrectomy in February 2017.  He began having recurrent right pleural effusions in April 2017.  I suspect he had a perioperative pneumonia with a parapneumonic effusion.  He required multiple thoracenteses add 3-1-month intervals.  I saw him in October 2018.  He had 2.2 L of fluid drained.  It was an exudate.  He was treated with prednisone taper.  I last saw him in December.  At that time there was still a small residual loculated effusion.  I recommended a 91-month follow-up.  He now returns for that.  In the interim since his last visit he has been feeling well.  He has not had any shortness of breath, cough, or orthopnea.  Past Medical History:  Diagnosis Date  . Anemia   . Anxiety   . Arthritis   . Cancer [facility])    bladder, ureter, Bil kidneys  . Dermatitis    scaly bump occurs every few months, pt uses cortizone cream and it goes away  . ESRD (end stage renal disease) on dialysis ([place])    Bilateral Nephrectomies- due to cancer  . GERD (gastroesophageal reflux disease) 04/07/2015  . History of blood transfusion   . Hypertension   . Lumbar back pain   . Mental disorder   . Pleural effusion, right    s/p right thoracentesis 10/07/15 (no malignancy by cytology report)  . Shortness of breath dyspnea    Lying down gets short of breath    Current Outpatient Medications  Medication Sig Dispense Refill  . amLODipine (NORVASC) 10 MG tablet     . clobetasol cream (TEMOVATE) 4.69 % Apply 1 application topically 2 (two) times daily.    . fluticasone (FLONASE) 50 MCG/ACT nasal spray Place 1 spray into both nostrils daily.     Marland Kitchen LORazepam (ATIVAN) 1 MG tablet Take 1 tablet (1 mg total) by mouth 2  (two) times daily as needed for anxiety. 60 tablet 0  . metoprolol tartrate (LOPRESSOR) 25 MG tablet Take 37.5 mg by mouth 2 (two) times daily.    . mirtazapine (REMERON) 15 MG tablet Take 1 tablet (15 mg total) by mouth at bedtime. 30 tablet 0  . Polyethyl Glycol-Propyl Glycol (SYSTANE OP) Place 1 drop into both eyes daily as needed (for dry, itchy eyes).    . sertraline (ZOLOFT) 50 MG tablet Take 1 tablet (50 mg total) by mouth daily. (Patient not taking: Reported on 05/09/2017) 30 tablet 0   No current facility-administered medications for this visit.     Physical Exam BP 123/75   Pulse (!) 58   Resp 20   Ht 5\' 10"  (1.778 m)   Wt 239 lb (108.4 kg)   SpO2 97% Comment: RA  BMI 34.58 kg/m  67 year old man in no acute distress Alert and oriented x3 with no focal deficits Lungs equal breath sounds bilaterally Cardiac regular rate and rhythm normal S1-S2 No cervical or subclavicular adenopathy  Diagnostic Tests: CHEST - 2 VIEW  COMPARISON:  Radiographs of February 07, 2017. CT scan of November 28, 2016.  FINDINGS: The heart size and mediastinal contours are within normal limits. No  pneumothorax is noted. Left lung is clear. Stable right basilar opacity is noted consistent with scarring or atelectasis with associated loculated right pleural effusion. Mild mediastinal shift from left to right is noted. The visualized skeletal structures are unremarkable.  IMPRESSION: Stable right basilar opacity is noted consistent with scarring or atelectasis with associated loculated right pleural effusion.   Electronically Signed   By: Marijo Conception, M.D.   On: 05/09/2017 11:21 I personally reviewed the chest x-ray images and concur with the findings as noted above.  There is no change from his study in December.  Impression: Jesus [name] is a 67 year old gentleman who had a chronic recurring exudative right pleural effusion dating back to April 2017.  He required multiple  thoracenteses but spaced out 3-47-month intervals.  His most recent thoracentesis in October 2018 drain 2.2 L of fluid.  He was treated with prednisone after that.  He has shown no evidence of recurrence since then.  His chest x-ray has not changed since December.  He is now 6 months out from his last thoracentesis.  I do not think he needs any more specific follow-up regarding the effusion.  Obviously if he develops recurrent symptoms a chest x-ray and possibly CT would be indicated.  Renal cell carcinoma-he is following up at the cancer center and at [facility].  He sees them again in June.  Plan: I will be happy to see Jesus [name] back anytime in the future if I can be of any further assistance with his care.  Melrose Nakayama, MD [facility] [phone]

## 2017-05-09 NOTE — Progress Notes (Signed)
[facility] MD/PA/NP OP Progress Note  05/11/2017 3:13 PM Jesus [name]  MRN:  [id number]  Chief Complaint:  Chief Complaint    Depression; Follow-up     HPI:  Patient presents for follow-up appointment for depression. He states that he is doing "whole a lot better." He feels he has 90% energy now versus 10% at the last visit. He is glad as he thought he would never feel better this way. He has been working on cars and mowing yard; although he cannot continue due to his physical condition, he feels good as he has a motivation to do it. He feels less depressed. He still has insomnia and wishes that he could take xanax. He has fair concentration. He denies SI. He feels denies anxiety. He denies panic attacks. He denies side effect from mirtazapine. He did not make an appointment for therapy as he is feeling better.   Wt Readings from Last 3 Encounters:  05/11/17 244 lb (110.7 kg)  05/09/17 239 lb (108.4 kg)  04/14/17 239 lb (108.4 kg)    Per PMP,  Xanax 1 mg 60 tabs for 30 days 03/21/2017 Ativan filled on 03/16/2017 I have utilized the [place] Controlled Substances Reporting System (PMP AWARxE) to confirm adherence regarding the patient's medication. My review reveals appropriate prescription fills.   Visit Diagnosis:    ICD-10-CM   1. Current moderate episode of major depressive disorder without prior episode ([place]) F32.1     Past Psychiatric History:  I have reviewed the patient's psychiatry history in detail and updated the patient record. [place] [facility] admission:denies Previous suicide attempt:denies Past trials of medication:citalopram(GI symptoms), Xanax History of violence:denies    Past Medical History:  Past Medical History:  Diagnosis Date  . Anemia   . Anxiety   . Arthritis   . Cancer [facility])    bladder, ureter, Bil kidneys  . Dermatitis    scaly bump occurs every few months, pt uses cortizone cream and it goes away  . ESRD (end stage renal disease) on  dialysis ([place])    Bilateral Nephrectomies- due to cancer  . GERD (gastroesophageal reflux disease) 04/07/2015  . History of blood transfusion   . Hypertension   . Lumbar back pain   . Mental disorder   . Pleural effusion, right    s/p right thoracentesis 10/07/15 (no malignancy by cytology report)  . Shortness of breath dyspnea    Lying down gets short of breath    Past Surgical History:  Procedure Laterality Date  . AV FISTULA PLACEMENT Right 06/05/2015   Procedure: ARTERIOVENOUS (AV) FISTULA CREATION RIGHT ARM;  Surgeon: Angelia Mould, MD;  Location: [place];  Service: Vascular;  Laterality: Right;  . BLADDER SURGERY  04-06-2015   removal of bladder/ [facility]   . CAPD INSERTION N/A 06/24/2016   Procedure: LAPAROSCOPIC INSERTION CONTINUOUS AMBULATORY PERITONEAL DIALYSIS  (CAPD) CATHETER;  Surgeon: Clovis Riley, MD;  Location: [place];  Service: General;  Laterality: N/A;  . CARPAL TUNNEL RELEASE     left hand x 2; right hand x 1; right elbow  . CERVICAL FUSION     x 2  . EYE SURGERY Bilateral    Cataract removal  . FISTULA SUPERFICIALIZATION Right 10/30/2015   Procedure: SUPERFICIALIZATION BRACHIOCEPHALIC ARTERIOVENOUS FISTULA Right Arm;  Surgeon: Angelia Mould, MD;  Location: [place];  Service: Vascular;  Laterality: Right;  . Hemocatheter    . IR GENERIC HISTORICAL Right 04/29/2016   IR THROMBECTOMY AV FISTULA W/THROMBOLYSIS/PTA  INC/SHUNT/IMG RIGHT 04/29/2016 Arne [facility], MD [facility]  . IR GENERIC HISTORICAL  04/29/2016   IR US GUIDE VASC ACCESS RIGHT 04/29/2016 Arne [place], MD [facility]  . IR THORACENTESIS ASP PLEURAL SPACE W/IMG GUIDE  12/06/2016  . KNEE ARTHROSCOPY Bilateral    bilateral  . KNEE ARTHROSCOPY WITH MEDIAL MENISECTOMY Left 02/26/2013   Procedure: KNEE ARTHROSCOPY WITH MEDIAL MENISECTOMY;  Surgeon: Garald Balding, MD;  Location: [place];  Service: Orthopedics;  Laterality: Left;  . LIGATION OF COMPETING BRANCHES OF ARTERIOVENOUS  FISTULA Right 10/30/2015   Procedure: LIGATION OF COMPETING BRANCHES OF BRACHIOCEPHALIC ARTERIOVENOUS FISTULA;  Surgeon: Angelia Mould, MD;  Location: [place];  Service: Vascular;  Laterality: Right;  . NEPHRECTOMY Right September 12, 2012  . NEPHRECTOMY Left 04-06-2015   [place] Right 09/07/2015   Procedure: Fistulagram;  Surgeon: Angelia Mould, MD;  Location: [place] CV [facility];  Service: Cardiovascular;  Laterality: Right;  ARM  . PERIPHERAL VASCULAR CATHETERIZATION Right 09/07/2015   Procedure: Peripheral Vascular Balloon Angioplasty;  Surgeon: Angelia Mould, MD;  Location: [place] CV [facility];  Service: Cardiovascular;  Laterality: Right;  upper arm venous  . PROSTATECTOMY  04-06-2015   [place] Bilateral    bilateral  . TOTAL KNEE ARTHROPLASTY  01/24/2012   Procedure: TOTAL KNEE ARTHROPLASTY;  Surgeon: Garald Balding, MD;  Location: [place];  Service: Orthopedics;  Laterality: Right;  RIGHT TOTAL KNEE REPLACEMENT    Family Psychiatric History:  I have reviewed the patient's family history in detail and updated the patient record.  Family History:  Family History  Problem Relation Age of Onset  . Cancer Mother   . Hypertension Father   . Atrial fibrillation Father   . Heart disease Father     Social History:  Social History   Socioeconomic History  . Marital status: Widowed    Spouse name: None  . Number of children: None  . Years of education: None  . Highest education level: None  Social Needs  . Financial resource strain: None  . Food insecurity - worry: None  . Food insecurity - inability: None  . Transportation needs - medical: None  . Transportation needs - non-medical: None  Occupational History  . None  Tobacco Use  . Smoking status: Never Smoker  . Smokeless tobacco: Never Used  Substance and Sexual Activity  . Alcohol use: No     Alcohol/week: 0.0 oz  . Drug use: No  . Sexual activity: None  Other Topics Concern  . None  Social History Narrative   Lives alone. Manages well per patient report. Does not cook much. Eats out a lot. Eats all food groups. Does not eat much fruit. Married in the past. Has one son. Used to drive a truck.   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 "okay" "never close with sisters"     Allergies: No Known Allergies  Metabolic Disorder Labs: No results found for: HGBA1C, MPG No results found for: PROLACTIN No results found for: CHOL, TRIG, HDL, CHOLHDL, VLDL, LDLCALC Lab Results  Component Value Date   TSH 3.97 03/14/2017    Therapeutic Level Labs: No results found for: LITHIUM No results found for: VALPROATE No components found for:  CBMZ  Current Medications: Current Outpatient Medications  Medication Sig Dispense Refill  . amLODipine (NORVASC) 10 MG tablet     . clobetasol cream (TEMOVATE) 1.69 % Apply 1 application topically 2 (two) times daily.    . fluticasone (FLONASE) 50 MCG/ACT nasal spray Place 1 spray into both nostrils daily.     Marland Kitchen LORazepam (ATIVAN) 1 MG tablet Take 1 tablet (1 mg total) by mouth daily as needed for anxiety. 30 tablet 1  . metoprolol tartrate (LOPRESSOR) 25 MG tablet Take 37.5 mg by mouth 2 (two) times daily.    . mirtazapine (REMERON) 15 MG tablet Take 1 tablet (15 mg total) by mouth at bedtime. 30 tablet 1  . Polyethyl Glycol-Propyl Glycol (SYSTANE OP) Place 1 drop into both eyes daily as needed (for dry, itchy eyes).     No current facility-administered medications for this visit.      Musculoskeletal: Strength & Muscle Tone: within normal limits Gait & Station: normal Patient leans: N/A  Psychiatric Specialty Exam: Review of Systems  Psychiatric/Behavioral: Positive for  depression. Negative for hallucinations, memory loss, substance abuse and suicidal ideas. The patient has insomnia. The patient is not nervous/anxious.   All other systems reviewed and are negative.   Blood pressure (!) 173/89, pulse 60, height 5\' 10"  (1.778 m), weight 244 lb (110.7 kg), SpO2 98 %.Body mass index is 35.01 kg/m.  General Appearance: Fairly Groomed  Eye Contact:  Good  Speech:  Clear and Coherent  Volume:  Normal  Mood:  "a lot better"  Affect:  Appropriate, Congruent and less restricted, smiles at times  Thought Process:  Coherent and Goal Directed  Orientation:  Full (Time, Place, and Person)  Thought Content: Logical   Suicidal Thoughts:  No  Homicidal Thoughts:  No  Memory:  Immediate;   Good Recent;   Good Remote;   Good  Judgement:  Good  Insight:  Fair  Psychomotor Activity:  Normal  Concentration:  Concentration: Good and Attention Span: Good  Recall:  Good  Fund of Knowledge: Good  Language: Good  Akathisia:  No  Handed:  Right  AIMS (if indicated): not done  Assets:  Communication Skills Desire for Improvement  ADL's:  Intact  Cognition: WNL  Sleep:  Poor   Screenings: PHQ2-9     Office Visit from 03/07/2017 in [facility] Office Visit from 11/02/2016 in [facility]  PHQ-2 Total Score  6  0  PHQ-9 Total Score  18  No data       Assessment and Plan:  NIKI COSMAN is a 67 y.o. year old male with a history of depression, hypertension,ESRD s/pbilateral nephrectomies for renal cell carcinoma, bladder cancer,Cervical spondylosis without myelopathy,anemia  , who presents for follow up appointment for Current moderate episode of major depressive disorder without prior episode ([place])  # MDD, moderate, single episode without psychotic features # Unspecified anxiety disorder # r/o MDD due to another medical condition Exam is notable for significant improvement in neurovegetative symptoms and anhedonia since starting mirtazapine.   Psychosocial stressors including chronic pain, being on dialysis, having underwent surgeries for cancer and grief of loss of his wife.  Will continue mirtazapine to target neurovegetative symptoms, insomnia and appetite loss.  Will continue Ativan as needed for anxiety.  Discussed risk of dependence and oversedation.  Discussed behavioral activation.  Will consider referral to CBT as indicated in the future.   Plan 1. Continue mirtazapine 15 mig at night  2. Continue ativan 1 mg daily as  needed for anxiety 3. Return to clinic in six weeks for 15 mins  The patient demonstrates the following risk factors for suicide: Chronic risk factors for suicide include:psychiatric disorder ofdepression, anxietyand chronic pain. Acute risk factorsfor suicide include: unemployment and social withdrawal/isolation. Protective factorsfor this patient include: coping skills and hope for the future. Considering these factors, the overall suicide risk at this point appears to below. Patientisappropriate for outpatient follow up.  Norman Clay, MD 05/11/2017, 3:13 PM

## 2017-05-10 DIAGNOSIS — N186 End stage renal disease: Secondary | ICD-10-CM | POA: Diagnosis not present

## 2017-05-10 DIAGNOSIS — D631 Anemia in chronic kidney disease: Secondary | ICD-10-CM | POA: Diagnosis not present

## 2017-05-10 DIAGNOSIS — Z992 Dependence on renal dialysis: Secondary | ICD-10-CM | POA: Diagnosis not present

## 2017-05-10 DIAGNOSIS — D509 Iron deficiency anemia, unspecified: Secondary | ICD-10-CM | POA: Diagnosis not present

## 2017-05-11 ENCOUNTER — Encounter (HOSPITAL_COMMUNITY): Payer: Self-pay | Admitting: Psychiatry

## 2017-05-11 ENCOUNTER — Ambulatory Visit (INDEPENDENT_AMBULATORY_CARE_PROVIDER_SITE_OTHER): Payer: Medicare Other | Admitting: Psychiatry

## 2017-05-11 VITALS — BP 173/89 | HR 60 | Ht 70.0 in | Wt 244.0 lb

## 2017-05-11 DIAGNOSIS — G4700 Insomnia, unspecified: Secondary | ICD-10-CM | POA: Diagnosis not present

## 2017-05-11 DIAGNOSIS — F419 Anxiety disorder, unspecified: Secondary | ICD-10-CM

## 2017-05-11 DIAGNOSIS — D631 Anemia in chronic kidney disease: Secondary | ICD-10-CM | POA: Diagnosis not present

## 2017-05-11 DIAGNOSIS — Z992 Dependence on renal dialysis: Secondary | ICD-10-CM | POA: Diagnosis not present

## 2017-05-11 DIAGNOSIS — F321 Major depressive disorder, single episode, moderate: Secondary | ICD-10-CM | POA: Diagnosis not present

## 2017-05-11 DIAGNOSIS — D509 Iron deficiency anemia, unspecified: Secondary | ICD-10-CM | POA: Diagnosis not present

## 2017-05-11 DIAGNOSIS — N186 End stage renal disease: Secondary | ICD-10-CM | POA: Diagnosis not present

## 2017-05-11 MED ORDER — MIRTAZAPINE 15 MG PO TABS: 15.0000 mg | ORAL_TABLET | Freq: Every day | ORAL | 1 refills | Status: DC

## 2017-05-11 MED ORDER — LORAZEPAM 1 MG PO TABS: 1.0000 mg | ORAL_TABLET | Freq: Two times a day (BID) | ORAL | 1 refills | Status: DC | PRN

## 2017-05-11 MED ORDER — LORAZEPAM 1 MG PO TABS: 1.0000 mg | ORAL_TABLET | Freq: Every day | ORAL | 1 refills | Status: DC | PRN

## 2017-05-11 NOTE — Patient Instructions (Signed)
1. Continue mirtazapine 15 mig at night  2. Continue ativan 1 mg daily as needed for anxiety 3. Return to clinic in six weeks for 15 mins

## 2017-05-12 DIAGNOSIS — D509 Iron deficiency anemia, unspecified: Secondary | ICD-10-CM | POA: Diagnosis not present

## 2017-05-12 DIAGNOSIS — D631 Anemia in chronic kidney disease: Secondary | ICD-10-CM | POA: Diagnosis not present

## 2017-05-12 DIAGNOSIS — G479 Sleep disorder, unspecified: Secondary | ICD-10-CM | POA: Diagnosis not present

## 2017-05-12 DIAGNOSIS — N186 End stage renal disease: Secondary | ICD-10-CM | POA: Diagnosis not present

## 2017-05-12 DIAGNOSIS — I10 Essential (primary) hypertension: Secondary | ICD-10-CM | POA: Diagnosis not present

## 2017-05-12 DIAGNOSIS — Z992 Dependence on renal dialysis: Secondary | ICD-10-CM | POA: Diagnosis not present

## 2017-05-13 DIAGNOSIS — D631 Anemia in chronic kidney disease: Secondary | ICD-10-CM | POA: Diagnosis not present

## 2017-05-13 DIAGNOSIS — N186 End stage renal disease: Secondary | ICD-10-CM | POA: Diagnosis not present

## 2017-05-13 DIAGNOSIS — D509 Iron deficiency anemia, unspecified: Secondary | ICD-10-CM | POA: Diagnosis not present

## 2017-05-13 DIAGNOSIS — Z992 Dependence on renal dialysis: Secondary | ICD-10-CM | POA: Diagnosis not present

## 2017-05-14 DIAGNOSIS — D509 Iron deficiency anemia, unspecified: Secondary | ICD-10-CM | POA: Diagnosis not present

## 2017-05-14 DIAGNOSIS — Z992 Dependence on renal dialysis: Secondary | ICD-10-CM | POA: Diagnosis not present

## 2017-05-14 DIAGNOSIS — D631 Anemia in chronic kidney disease: Secondary | ICD-10-CM | POA: Diagnosis not present

## 2017-05-14 DIAGNOSIS — N186 End stage renal disease: Secondary | ICD-10-CM | POA: Diagnosis not present

## 2017-05-15 DIAGNOSIS — D631 Anemia in chronic kidney disease: Secondary | ICD-10-CM | POA: Diagnosis not present

## 2017-05-15 DIAGNOSIS — D509 Iron deficiency anemia, unspecified: Secondary | ICD-10-CM | POA: Diagnosis not present

## 2017-05-15 DIAGNOSIS — N186 End stage renal disease: Secondary | ICD-10-CM | POA: Diagnosis not present

## 2017-05-15 DIAGNOSIS — Z992 Dependence on renal dialysis: Secondary | ICD-10-CM | POA: Diagnosis not present

## 2017-05-16 DIAGNOSIS — Z992 Dependence on renal dialysis: Secondary | ICD-10-CM | POA: Diagnosis not present

## 2017-05-16 DIAGNOSIS — D509 Iron deficiency anemia, unspecified: Secondary | ICD-10-CM | POA: Diagnosis not present

## 2017-05-16 DIAGNOSIS — D631 Anemia in chronic kidney disease: Secondary | ICD-10-CM | POA: Diagnosis not present

## 2017-05-16 DIAGNOSIS — N186 End stage renal disease: Secondary | ICD-10-CM | POA: Diagnosis not present

## 2017-05-17 DIAGNOSIS — D509 Iron deficiency anemia, unspecified: Secondary | ICD-10-CM | POA: Diagnosis not present

## 2017-05-17 DIAGNOSIS — Z992 Dependence on renal dialysis: Secondary | ICD-10-CM | POA: Diagnosis not present

## 2017-05-17 DIAGNOSIS — N186 End stage renal disease: Secondary | ICD-10-CM | POA: Diagnosis not present

## 2017-05-17 DIAGNOSIS — D631 Anemia in chronic kidney disease: Secondary | ICD-10-CM | POA: Diagnosis not present

## 2017-05-18 DIAGNOSIS — Z992 Dependence on renal dialysis: Secondary | ICD-10-CM | POA: Diagnosis not present

## 2017-05-18 DIAGNOSIS — D509 Iron deficiency anemia, unspecified: Secondary | ICD-10-CM | POA: Diagnosis not present

## 2017-05-18 DIAGNOSIS — D631 Anemia in chronic kidney disease: Secondary | ICD-10-CM | POA: Diagnosis not present

## 2017-05-18 DIAGNOSIS — N186 End stage renal disease: Secondary | ICD-10-CM | POA: Diagnosis not present

## 2017-05-19 DIAGNOSIS — D509 Iron deficiency anemia, unspecified: Secondary | ICD-10-CM | POA: Diagnosis not present

## 2017-05-19 DIAGNOSIS — Z992 Dependence on renal dialysis: Secondary | ICD-10-CM | POA: Diagnosis not present

## 2017-05-19 DIAGNOSIS — N186 End stage renal disease: Secondary | ICD-10-CM | POA: Diagnosis not present

## 2017-05-19 DIAGNOSIS — D631 Anemia in chronic kidney disease: Secondary | ICD-10-CM | POA: Diagnosis not present

## 2017-05-20 DIAGNOSIS — Z992 Dependence on renal dialysis: Secondary | ICD-10-CM | POA: Diagnosis not present

## 2017-05-20 DIAGNOSIS — N186 End stage renal disease: Secondary | ICD-10-CM | POA: Diagnosis not present

## 2017-05-20 DIAGNOSIS — D631 Anemia in chronic kidney disease: Secondary | ICD-10-CM | POA: Diagnosis not present

## 2017-05-20 DIAGNOSIS — D509 Iron deficiency anemia, unspecified: Secondary | ICD-10-CM | POA: Diagnosis not present

## 2017-05-21 DIAGNOSIS — D509 Iron deficiency anemia, unspecified: Secondary | ICD-10-CM | POA: Diagnosis not present

## 2017-05-21 DIAGNOSIS — N186 End stage renal disease: Secondary | ICD-10-CM | POA: Diagnosis not present

## 2017-05-21 DIAGNOSIS — D631 Anemia in chronic kidney disease: Secondary | ICD-10-CM | POA: Diagnosis not present

## 2017-05-21 DIAGNOSIS — Z992 Dependence on renal dialysis: Secondary | ICD-10-CM | POA: Diagnosis not present

## 2017-05-22 DIAGNOSIS — Z992 Dependence on renal dialysis: Secondary | ICD-10-CM | POA: Diagnosis not present

## 2017-05-22 DIAGNOSIS — N186 End stage renal disease: Secondary | ICD-10-CM | POA: Diagnosis not present

## 2017-05-22 DIAGNOSIS — D631 Anemia in chronic kidney disease: Secondary | ICD-10-CM | POA: Diagnosis not present

## 2017-05-22 DIAGNOSIS — D509 Iron deficiency anemia, unspecified: Secondary | ICD-10-CM | POA: Diagnosis not present

## 2017-05-23 DIAGNOSIS — D631 Anemia in chronic kidney disease: Secondary | ICD-10-CM | POA: Diagnosis not present

## 2017-05-23 DIAGNOSIS — Z992 Dependence on renal dialysis: Secondary | ICD-10-CM | POA: Diagnosis not present

## 2017-05-23 DIAGNOSIS — N186 End stage renal disease: Secondary | ICD-10-CM | POA: Diagnosis not present

## 2017-05-23 DIAGNOSIS — D509 Iron deficiency anemia, unspecified: Secondary | ICD-10-CM | POA: Diagnosis not present

## 2017-05-24 DIAGNOSIS — D631 Anemia in chronic kidney disease: Secondary | ICD-10-CM | POA: Diagnosis not present

## 2017-05-24 DIAGNOSIS — N186 End stage renal disease: Secondary | ICD-10-CM | POA: Diagnosis not present

## 2017-05-24 DIAGNOSIS — Z992 Dependence on renal dialysis: Secondary | ICD-10-CM | POA: Diagnosis not present

## 2017-05-24 DIAGNOSIS — D509 Iron deficiency anemia, unspecified: Secondary | ICD-10-CM | POA: Diagnosis not present

## 2017-05-25 DIAGNOSIS — N186 End stage renal disease: Secondary | ICD-10-CM | POA: Diagnosis not present

## 2017-05-25 DIAGNOSIS — D509 Iron deficiency anemia, unspecified: Secondary | ICD-10-CM | POA: Diagnosis not present

## 2017-05-25 DIAGNOSIS — Z992 Dependence on renal dialysis: Secondary | ICD-10-CM | POA: Diagnosis not present

## 2017-05-25 DIAGNOSIS — D631 Anemia in chronic kidney disease: Secondary | ICD-10-CM | POA: Diagnosis not present

## 2017-05-26 DIAGNOSIS — D631 Anemia in chronic kidney disease: Secondary | ICD-10-CM | POA: Diagnosis not present

## 2017-05-26 DIAGNOSIS — N186 End stage renal disease: Secondary | ICD-10-CM | POA: Diagnosis not present

## 2017-05-26 DIAGNOSIS — Z992 Dependence on renal dialysis: Secondary | ICD-10-CM | POA: Diagnosis not present

## 2017-05-26 DIAGNOSIS — D509 Iron deficiency anemia, unspecified: Secondary | ICD-10-CM | POA: Diagnosis not present

## 2017-05-27 DIAGNOSIS — D631 Anemia in chronic kidney disease: Secondary | ICD-10-CM | POA: Diagnosis not present

## 2017-05-27 DIAGNOSIS — N186 End stage renal disease: Secondary | ICD-10-CM | POA: Diagnosis not present

## 2017-05-27 DIAGNOSIS — Z992 Dependence on renal dialysis: Secondary | ICD-10-CM | POA: Diagnosis not present

## 2017-05-27 DIAGNOSIS — D509 Iron deficiency anemia, unspecified: Secondary | ICD-10-CM | POA: Diagnosis not present

## 2017-05-28 DIAGNOSIS — D631 Anemia in chronic kidney disease: Secondary | ICD-10-CM | POA: Diagnosis not present

## 2017-05-28 DIAGNOSIS — Z992 Dependence on renal dialysis: Secondary | ICD-10-CM | POA: Diagnosis not present

## 2017-05-28 DIAGNOSIS — N186 End stage renal disease: Secondary | ICD-10-CM | POA: Diagnosis not present

## 2017-05-28 DIAGNOSIS — D509 Iron deficiency anemia, unspecified: Secondary | ICD-10-CM | POA: Diagnosis not present

## 2017-05-29 DIAGNOSIS — N2581 Secondary hyperparathyroidism of renal origin: Secondary | ICD-10-CM | POA: Diagnosis not present

## 2017-05-29 DIAGNOSIS — D631 Anemia in chronic kidney disease: Secondary | ICD-10-CM | POA: Diagnosis not present

## 2017-05-29 DIAGNOSIS — N186 End stage renal disease: Secondary | ICD-10-CM | POA: Diagnosis not present

## 2017-05-29 DIAGNOSIS — Z992 Dependence on renal dialysis: Secondary | ICD-10-CM | POA: Diagnosis not present

## 2017-05-29 DIAGNOSIS — D509 Iron deficiency anemia, unspecified: Secondary | ICD-10-CM | POA: Diagnosis not present

## 2017-05-30 DIAGNOSIS — D509 Iron deficiency anemia, unspecified: Secondary | ICD-10-CM | POA: Diagnosis not present

## 2017-05-30 DIAGNOSIS — Z992 Dependence on renal dialysis: Secondary | ICD-10-CM | POA: Diagnosis not present

## 2017-05-30 DIAGNOSIS — D631 Anemia in chronic kidney disease: Secondary | ICD-10-CM | POA: Diagnosis not present

## 2017-05-30 DIAGNOSIS — N2581 Secondary hyperparathyroidism of renal origin: Secondary | ICD-10-CM | POA: Diagnosis not present

## 2017-05-30 DIAGNOSIS — N186 End stage renal disease: Secondary | ICD-10-CM | POA: Diagnosis not present

## 2017-05-31 DIAGNOSIS — N2581 Secondary hyperparathyroidism of renal origin: Secondary | ICD-10-CM | POA: Diagnosis not present

## 2017-05-31 DIAGNOSIS — D631 Anemia in chronic kidney disease: Secondary | ICD-10-CM | POA: Diagnosis not present

## 2017-05-31 DIAGNOSIS — D509 Iron deficiency anemia, unspecified: Secondary | ICD-10-CM | POA: Diagnosis not present

## 2017-05-31 DIAGNOSIS — Z992 Dependence on renal dialysis: Secondary | ICD-10-CM | POA: Diagnosis not present

## 2017-05-31 DIAGNOSIS — N186 End stage renal disease: Secondary | ICD-10-CM | POA: Diagnosis not present

## 2017-06-01 DIAGNOSIS — D509 Iron deficiency anemia, unspecified: Secondary | ICD-10-CM | POA: Diagnosis not present

## 2017-06-01 DIAGNOSIS — N2581 Secondary hyperparathyroidism of renal origin: Secondary | ICD-10-CM | POA: Diagnosis not present

## 2017-06-01 DIAGNOSIS — N186 End stage renal disease: Secondary | ICD-10-CM | POA: Diagnosis not present

## 2017-06-01 DIAGNOSIS — D631 Anemia in chronic kidney disease: Secondary | ICD-10-CM | POA: Diagnosis not present

## 2017-06-01 DIAGNOSIS — Z992 Dependence on renal dialysis: Secondary | ICD-10-CM | POA: Diagnosis not present

## 2017-06-02 DIAGNOSIS — N2581 Secondary hyperparathyroidism of renal origin: Secondary | ICD-10-CM | POA: Diagnosis not present

## 2017-06-02 DIAGNOSIS — D631 Anemia in chronic kidney disease: Secondary | ICD-10-CM | POA: Diagnosis not present

## 2017-06-02 DIAGNOSIS — Z992 Dependence on renal dialysis: Secondary | ICD-10-CM | POA: Diagnosis not present

## 2017-06-02 DIAGNOSIS — D509 Iron deficiency anemia, unspecified: Secondary | ICD-10-CM | POA: Diagnosis not present

## 2017-06-02 DIAGNOSIS — N186 End stage renal disease: Secondary | ICD-10-CM | POA: Diagnosis not present

## 2017-06-03 DIAGNOSIS — N186 End stage renal disease: Secondary | ICD-10-CM | POA: Diagnosis not present

## 2017-06-03 DIAGNOSIS — D509 Iron deficiency anemia, unspecified: Secondary | ICD-10-CM | POA: Diagnosis not present

## 2017-06-03 DIAGNOSIS — D631 Anemia in chronic kidney disease: Secondary | ICD-10-CM | POA: Diagnosis not present

## 2017-06-03 DIAGNOSIS — N2581 Secondary hyperparathyroidism of renal origin: Secondary | ICD-10-CM | POA: Diagnosis not present

## 2017-06-03 DIAGNOSIS — Z992 Dependence on renal dialysis: Secondary | ICD-10-CM | POA: Diagnosis not present

## 2017-06-04 DIAGNOSIS — D509 Iron deficiency anemia, unspecified: Secondary | ICD-10-CM | POA: Diagnosis not present

## 2017-06-04 DIAGNOSIS — N2581 Secondary hyperparathyroidism of renal origin: Secondary | ICD-10-CM | POA: Diagnosis not present

## 2017-06-04 DIAGNOSIS — N186 End stage renal disease: Secondary | ICD-10-CM | POA: Diagnosis not present

## 2017-06-04 DIAGNOSIS — D631 Anemia in chronic kidney disease: Secondary | ICD-10-CM | POA: Diagnosis not present

## 2017-06-04 DIAGNOSIS — Z992 Dependence on renal dialysis: Secondary | ICD-10-CM | POA: Diagnosis not present

## 2017-06-05 DIAGNOSIS — D509 Iron deficiency anemia, unspecified: Secondary | ICD-10-CM | POA: Diagnosis not present

## 2017-06-05 DIAGNOSIS — D631 Anemia in chronic kidney disease: Secondary | ICD-10-CM | POA: Diagnosis not present

## 2017-06-05 DIAGNOSIS — N2581 Secondary hyperparathyroidism of renal origin: Secondary | ICD-10-CM | POA: Diagnosis not present

## 2017-06-05 DIAGNOSIS — N186 End stage renal disease: Secondary | ICD-10-CM | POA: Diagnosis not present

## 2017-06-05 DIAGNOSIS — Z992 Dependence on renal dialysis: Secondary | ICD-10-CM | POA: Diagnosis not present

## 2017-06-06 DIAGNOSIS — Z992 Dependence on renal dialysis: Secondary | ICD-10-CM | POA: Diagnosis not present

## 2017-06-06 DIAGNOSIS — N2581 Secondary hyperparathyroidism of renal origin: Secondary | ICD-10-CM | POA: Diagnosis not present

## 2017-06-06 DIAGNOSIS — D631 Anemia in chronic kidney disease: Secondary | ICD-10-CM | POA: Diagnosis not present

## 2017-06-06 DIAGNOSIS — N186 End stage renal disease: Secondary | ICD-10-CM | POA: Diagnosis not present

## 2017-06-06 DIAGNOSIS — D509 Iron deficiency anemia, unspecified: Secondary | ICD-10-CM | POA: Diagnosis not present

## 2017-06-07 DIAGNOSIS — Z992 Dependence on renal dialysis: Secondary | ICD-10-CM | POA: Diagnosis not present

## 2017-06-07 DIAGNOSIS — N186 End stage renal disease: Secondary | ICD-10-CM | POA: Diagnosis not present

## 2017-06-07 DIAGNOSIS — N2581 Secondary hyperparathyroidism of renal origin: Secondary | ICD-10-CM | POA: Diagnosis not present

## 2017-06-07 DIAGNOSIS — D631 Anemia in chronic kidney disease: Secondary | ICD-10-CM | POA: Diagnosis not present

## 2017-06-07 DIAGNOSIS — D509 Iron deficiency anemia, unspecified: Secondary | ICD-10-CM | POA: Diagnosis not present

## 2017-06-08 DIAGNOSIS — D631 Anemia in chronic kidney disease: Secondary | ICD-10-CM | POA: Diagnosis not present

## 2017-06-08 DIAGNOSIS — M5020 Other cervical disc displacement, unspecified cervical region: Secondary | ICD-10-CM | POA: Diagnosis not present

## 2017-06-08 DIAGNOSIS — Z992 Dependence on renal dialysis: Secondary | ICD-10-CM | POA: Diagnosis not present

## 2017-06-08 DIAGNOSIS — N186 End stage renal disease: Secondary | ICD-10-CM | POA: Diagnosis not present

## 2017-06-08 DIAGNOSIS — N2581 Secondary hyperparathyroidism of renal origin: Secondary | ICD-10-CM | POA: Diagnosis not present

## 2017-06-08 DIAGNOSIS — M47816 Spondylosis without myelopathy or radiculopathy, lumbar region: Secondary | ICD-10-CM | POA: Diagnosis not present

## 2017-06-08 DIAGNOSIS — D509 Iron deficiency anemia, unspecified: Secondary | ICD-10-CM | POA: Diagnosis not present

## 2017-06-09 DIAGNOSIS — Z992 Dependence on renal dialysis: Secondary | ICD-10-CM | POA: Diagnosis not present

## 2017-06-09 DIAGNOSIS — D509 Iron deficiency anemia, unspecified: Secondary | ICD-10-CM | POA: Diagnosis not present

## 2017-06-09 DIAGNOSIS — D631 Anemia in chronic kidney disease: Secondary | ICD-10-CM | POA: Diagnosis not present

## 2017-06-09 DIAGNOSIS — N2581 Secondary hyperparathyroidism of renal origin: Secondary | ICD-10-CM | POA: Diagnosis not present

## 2017-06-09 DIAGNOSIS — N186 End stage renal disease: Secondary | ICD-10-CM | POA: Diagnosis not present

## 2017-06-10 DIAGNOSIS — N2581 Secondary hyperparathyroidism of renal origin: Secondary | ICD-10-CM | POA: Diagnosis not present

## 2017-06-10 DIAGNOSIS — D509 Iron deficiency anemia, unspecified: Secondary | ICD-10-CM | POA: Diagnosis not present

## 2017-06-10 DIAGNOSIS — Z992 Dependence on renal dialysis: Secondary | ICD-10-CM | POA: Diagnosis not present

## 2017-06-10 DIAGNOSIS — N186 End stage renal disease: Secondary | ICD-10-CM | POA: Diagnosis not present

## 2017-06-10 DIAGNOSIS — D631 Anemia in chronic kidney disease: Secondary | ICD-10-CM | POA: Diagnosis not present

## 2017-06-11 DIAGNOSIS — D631 Anemia in chronic kidney disease: Secondary | ICD-10-CM | POA: Diagnosis not present

## 2017-06-11 DIAGNOSIS — N2581 Secondary hyperparathyroidism of renal origin: Secondary | ICD-10-CM | POA: Diagnosis not present

## 2017-06-11 DIAGNOSIS — D509 Iron deficiency anemia, unspecified: Secondary | ICD-10-CM | POA: Diagnosis not present

## 2017-06-11 DIAGNOSIS — Z992 Dependence on renal dialysis: Secondary | ICD-10-CM | POA: Diagnosis not present

## 2017-06-11 DIAGNOSIS — N186 End stage renal disease: Secondary | ICD-10-CM | POA: Diagnosis not present

## 2017-06-12 DIAGNOSIS — N186 End stage renal disease: Secondary | ICD-10-CM | POA: Diagnosis not present

## 2017-06-12 DIAGNOSIS — D509 Iron deficiency anemia, unspecified: Secondary | ICD-10-CM | POA: Diagnosis not present

## 2017-06-12 DIAGNOSIS — N2581 Secondary hyperparathyroidism of renal origin: Secondary | ICD-10-CM | POA: Diagnosis not present

## 2017-06-12 DIAGNOSIS — Z992 Dependence on renal dialysis: Secondary | ICD-10-CM | POA: Diagnosis not present

## 2017-06-12 DIAGNOSIS — D631 Anemia in chronic kidney disease: Secondary | ICD-10-CM | POA: Diagnosis not present

## 2017-06-13 DIAGNOSIS — D509 Iron deficiency anemia, unspecified: Secondary | ICD-10-CM | POA: Diagnosis not present

## 2017-06-13 DIAGNOSIS — N186 End stage renal disease: Secondary | ICD-10-CM | POA: Diagnosis not present

## 2017-06-13 DIAGNOSIS — N2581 Secondary hyperparathyroidism of renal origin: Secondary | ICD-10-CM | POA: Diagnosis not present

## 2017-06-13 DIAGNOSIS — Z992 Dependence on renal dialysis: Secondary | ICD-10-CM | POA: Diagnosis not present

## 2017-06-13 DIAGNOSIS — D631 Anemia in chronic kidney disease: Secondary | ICD-10-CM | POA: Diagnosis not present

## 2017-06-14 DIAGNOSIS — N186 End stage renal disease: Secondary | ICD-10-CM | POA: Diagnosis not present

## 2017-06-14 DIAGNOSIS — D509 Iron deficiency anemia, unspecified: Secondary | ICD-10-CM | POA: Diagnosis not present

## 2017-06-14 DIAGNOSIS — N2581 Secondary hyperparathyroidism of renal origin: Secondary | ICD-10-CM | POA: Diagnosis not present

## 2017-06-14 DIAGNOSIS — Z992 Dependence on renal dialysis: Secondary | ICD-10-CM | POA: Diagnosis not present

## 2017-06-14 DIAGNOSIS — D631 Anemia in chronic kidney disease: Secondary | ICD-10-CM | POA: Diagnosis not present

## 2017-06-15 DIAGNOSIS — N186 End stage renal disease: Secondary | ICD-10-CM | POA: Diagnosis not present

## 2017-06-15 DIAGNOSIS — Z992 Dependence on renal dialysis: Secondary | ICD-10-CM | POA: Diagnosis not present

## 2017-06-15 DIAGNOSIS — D509 Iron deficiency anemia, unspecified: Secondary | ICD-10-CM | POA: Diagnosis not present

## 2017-06-15 DIAGNOSIS — N2581 Secondary hyperparathyroidism of renal origin: Secondary | ICD-10-CM | POA: Diagnosis not present

## 2017-06-15 DIAGNOSIS — D631 Anemia in chronic kidney disease: Secondary | ICD-10-CM | POA: Diagnosis not present

## 2017-06-16 DIAGNOSIS — N2581 Secondary hyperparathyroidism of renal origin: Secondary | ICD-10-CM | POA: Diagnosis not present

## 2017-06-16 DIAGNOSIS — D509 Iron deficiency anemia, unspecified: Secondary | ICD-10-CM | POA: Diagnosis not present

## 2017-06-16 DIAGNOSIS — D631 Anemia in chronic kidney disease: Secondary | ICD-10-CM | POA: Diagnosis not present

## 2017-06-16 DIAGNOSIS — Z992 Dependence on renal dialysis: Secondary | ICD-10-CM | POA: Diagnosis not present

## 2017-06-16 DIAGNOSIS — N186 End stage renal disease: Secondary | ICD-10-CM | POA: Diagnosis not present

## 2017-06-17 DIAGNOSIS — N186 End stage renal disease: Secondary | ICD-10-CM | POA: Diagnosis not present

## 2017-06-17 DIAGNOSIS — D631 Anemia in chronic kidney disease: Secondary | ICD-10-CM | POA: Diagnosis not present

## 2017-06-17 DIAGNOSIS — N2581 Secondary hyperparathyroidism of renal origin: Secondary | ICD-10-CM | POA: Diagnosis not present

## 2017-06-17 DIAGNOSIS — D509 Iron deficiency anemia, unspecified: Secondary | ICD-10-CM | POA: Diagnosis not present

## 2017-06-17 DIAGNOSIS — Z992 Dependence on renal dialysis: Secondary | ICD-10-CM | POA: Diagnosis not present

## 2017-06-18 DIAGNOSIS — D631 Anemia in chronic kidney disease: Secondary | ICD-10-CM | POA: Diagnosis not present

## 2017-06-18 DIAGNOSIS — N186 End stage renal disease: Secondary | ICD-10-CM | POA: Diagnosis not present

## 2017-06-18 DIAGNOSIS — N2581 Secondary hyperparathyroidism of renal origin: Secondary | ICD-10-CM | POA: Diagnosis not present

## 2017-06-18 DIAGNOSIS — D509 Iron deficiency anemia, unspecified: Secondary | ICD-10-CM | POA: Diagnosis not present

## 2017-06-18 DIAGNOSIS — Z992 Dependence on renal dialysis: Secondary | ICD-10-CM | POA: Diagnosis not present

## 2017-06-19 DIAGNOSIS — D509 Iron deficiency anemia, unspecified: Secondary | ICD-10-CM | POA: Diagnosis not present

## 2017-06-19 DIAGNOSIS — N2581 Secondary hyperparathyroidism of renal origin: Secondary | ICD-10-CM | POA: Diagnosis not present

## 2017-06-19 DIAGNOSIS — Z992 Dependence on renal dialysis: Secondary | ICD-10-CM | POA: Diagnosis not present

## 2017-06-19 DIAGNOSIS — D631 Anemia in chronic kidney disease: Secondary | ICD-10-CM | POA: Diagnosis not present

## 2017-06-19 DIAGNOSIS — N186 End stage renal disease: Secondary | ICD-10-CM | POA: Diagnosis not present

## 2017-06-20 DIAGNOSIS — Z992 Dependence on renal dialysis: Secondary | ICD-10-CM | POA: Diagnosis not present

## 2017-06-20 DIAGNOSIS — D631 Anemia in chronic kidney disease: Secondary | ICD-10-CM | POA: Diagnosis not present

## 2017-06-20 DIAGNOSIS — D509 Iron deficiency anemia, unspecified: Secondary | ICD-10-CM | POA: Diagnosis not present

## 2017-06-20 DIAGNOSIS — N186 End stage renal disease: Secondary | ICD-10-CM | POA: Diagnosis not present

## 2017-06-20 DIAGNOSIS — N2581 Secondary hyperparathyroidism of renal origin: Secondary | ICD-10-CM | POA: Diagnosis not present

## 2017-06-21 DIAGNOSIS — N186 End stage renal disease: Secondary | ICD-10-CM | POA: Diagnosis not present

## 2017-06-21 DIAGNOSIS — N2581 Secondary hyperparathyroidism of renal origin: Secondary | ICD-10-CM | POA: Diagnosis not present

## 2017-06-21 DIAGNOSIS — D631 Anemia in chronic kidney disease: Secondary | ICD-10-CM | POA: Diagnosis not present

## 2017-06-21 DIAGNOSIS — Z992 Dependence on renal dialysis: Secondary | ICD-10-CM | POA: Diagnosis not present

## 2017-06-21 DIAGNOSIS — D509 Iron deficiency anemia, unspecified: Secondary | ICD-10-CM | POA: Diagnosis not present

## 2017-06-22 DIAGNOSIS — D509 Iron deficiency anemia, unspecified: Secondary | ICD-10-CM | POA: Diagnosis not present

## 2017-06-22 DIAGNOSIS — D631 Anemia in chronic kidney disease: Secondary | ICD-10-CM | POA: Diagnosis not present

## 2017-06-22 DIAGNOSIS — Z992 Dependence on renal dialysis: Secondary | ICD-10-CM | POA: Diagnosis not present

## 2017-06-22 DIAGNOSIS — N186 End stage renal disease: Secondary | ICD-10-CM | POA: Diagnosis not present

## 2017-06-22 DIAGNOSIS — N2581 Secondary hyperparathyroidism of renal origin: Secondary | ICD-10-CM | POA: Diagnosis not present

## 2017-06-23 DIAGNOSIS — Z992 Dependence on renal dialysis: Secondary | ICD-10-CM | POA: Diagnosis not present

## 2017-06-23 DIAGNOSIS — N186 End stage renal disease: Secondary | ICD-10-CM | POA: Diagnosis not present

## 2017-06-23 DIAGNOSIS — N2581 Secondary hyperparathyroidism of renal origin: Secondary | ICD-10-CM | POA: Diagnosis not present

## 2017-06-23 DIAGNOSIS — D509 Iron deficiency anemia, unspecified: Secondary | ICD-10-CM | POA: Diagnosis not present

## 2017-06-23 DIAGNOSIS — D631 Anemia in chronic kidney disease: Secondary | ICD-10-CM | POA: Diagnosis not present

## 2017-06-24 DIAGNOSIS — D631 Anemia in chronic kidney disease: Secondary | ICD-10-CM | POA: Diagnosis not present

## 2017-06-24 DIAGNOSIS — Z992 Dependence on renal dialysis: Secondary | ICD-10-CM | POA: Diagnosis not present

## 2017-06-24 DIAGNOSIS — D509 Iron deficiency anemia, unspecified: Secondary | ICD-10-CM | POA: Diagnosis not present

## 2017-06-24 DIAGNOSIS — N2581 Secondary hyperparathyroidism of renal origin: Secondary | ICD-10-CM | POA: Diagnosis not present

## 2017-06-24 DIAGNOSIS — N186 End stage renal disease: Secondary | ICD-10-CM | POA: Diagnosis not present

## 2017-06-25 DIAGNOSIS — N2581 Secondary hyperparathyroidism of renal origin: Secondary | ICD-10-CM | POA: Diagnosis not present

## 2017-06-25 DIAGNOSIS — N186 End stage renal disease: Secondary | ICD-10-CM | POA: Diagnosis not present

## 2017-06-25 DIAGNOSIS — Z992 Dependence on renal dialysis: Secondary | ICD-10-CM | POA: Diagnosis not present

## 2017-06-25 DIAGNOSIS — D631 Anemia in chronic kidney disease: Secondary | ICD-10-CM | POA: Diagnosis not present

## 2017-06-25 DIAGNOSIS — D509 Iron deficiency anemia, unspecified: Secondary | ICD-10-CM | POA: Diagnosis not present

## 2017-06-26 DIAGNOSIS — Z992 Dependence on renal dialysis: Secondary | ICD-10-CM | POA: Diagnosis not present

## 2017-06-26 DIAGNOSIS — D631 Anemia in chronic kidney disease: Secondary | ICD-10-CM | POA: Diagnosis not present

## 2017-06-26 DIAGNOSIS — N2581 Secondary hyperparathyroidism of renal origin: Secondary | ICD-10-CM | POA: Diagnosis not present

## 2017-06-26 DIAGNOSIS — N186 End stage renal disease: Secondary | ICD-10-CM | POA: Diagnosis not present

## 2017-06-26 DIAGNOSIS — D509 Iron deficiency anemia, unspecified: Secondary | ICD-10-CM | POA: Diagnosis not present

## 2017-06-27 DIAGNOSIS — N186 End stage renal disease: Secondary | ICD-10-CM | POA: Diagnosis not present

## 2017-06-27 DIAGNOSIS — D631 Anemia in chronic kidney disease: Secondary | ICD-10-CM | POA: Diagnosis not present

## 2017-06-27 DIAGNOSIS — D509 Iron deficiency anemia, unspecified: Secondary | ICD-10-CM | POA: Diagnosis not present

## 2017-06-27 DIAGNOSIS — N2581 Secondary hyperparathyroidism of renal origin: Secondary | ICD-10-CM | POA: Diagnosis not present

## 2017-06-27 DIAGNOSIS — Z992 Dependence on renal dialysis: Secondary | ICD-10-CM | POA: Diagnosis not present

## 2017-06-27 NOTE — Progress Notes (Signed)
[place] MD/PA/NP OP Progress Note  06/28/2017 4:39 PM BUCKY GRIGG  MRN:  [id number]  Chief Complaint:  Chief Complaint    Follow-up; Depression     HPI:  Patient presents for follow-up appointment for depression.  He states that he has been feeling tired, although he feels "okay." He feels frustrated as he tends to feel weaker while he goes to [facility]. He feels somnolent during the day. He admit taking both xanax and ativan at times when he is asked. He denies taking both every day, but reports he did it for sleep. He states that he will continue take both medication or at least Xanax, stating that he has done things which was told as dangerous in the past. He states that he used to be drinking while he was aware of his kidney issues. He used foul language and states that he was told by another provider that the doctor here is crazy, and he thinks that is right. He is not planning to return to this clinic. He has insomnia. He feels fatigue. He has mild anhedonia. He denies SI. He feels anxious at times.   Per PMP,  Xanax 1 mg BID filled on 06/08/2017  ativan 1 mg BID filled on 06/19/2017   Visit Diagnosis:    ICD-10-CM   1. Current moderate episode of major depressive disorder without prior episode ([place]) F32.1     Past Psychiatric History:  I have reviewed the patient's psychiatry history in detail and updated the patient record. [place] [facility] admission:denies Previous suicide attempt:denies Past trials of medication:citalopram(GI symptoms), Xanax History of violence:denies   Past Medical History:  Past Medical History:  Diagnosis Date  . Anemia   . Anxiety   . Arthritis   . Cancer [facility])    bladder, ureter, Bil kidneys  . Current moderate episode of major depressive disorder without prior episode ([place]) 06/28/2017  . Dermatitis    scaly bump occurs every few months, pt uses cortizone cream and it goes away  . ESRD (end stage renal disease) on dialysis ([place])     Bilateral Nephrectomies- due to cancer  . GERD (gastroesophageal reflux disease) 04/07/2015  . History of blood transfusion   . Hypertension   . Lumbar back pain   . Mental disorder   . Pleural effusion, right    s/p right thoracentesis 10/07/15 (no malignancy by cytology report)  . Shortness of breath dyspnea    Lying down gets short of breath    Past Surgical History:  Procedure Laterality Date  . AV FISTULA PLACEMENT Right 06/05/2015   Procedure: ARTERIOVENOUS (AV) FISTULA CREATION RIGHT ARM;  Surgeon: Angelia Mould, MD;  Location: [place];  Service: Vascular;  Laterality: Right;  . BLADDER SURGERY  04-06-2015   removal of bladder/ [facility]   . CAPD INSERTION N/A 06/24/2016   Procedure: LAPAROSCOPIC INSERTION CONTINUOUS AMBULATORY PERITONEAL DIALYSIS  (CAPD) CATHETER;  Surgeon: Clovis Riley, MD;  Location: [place];  Service: General;  Laterality: N/A;  . CARPAL TUNNEL RELEASE     left hand x 2; right hand x 1; right elbow  . CERVICAL FUSION     x 2  . EYE SURGERY Bilateral    Cataract removal  . FISTULA SUPERFICIALIZATION Right 10/30/2015   Procedure: SUPERFICIALIZATION BRACHIOCEPHALIC ARTERIOVENOUS FISTULA Right Arm;  Surgeon: Angelia Mould, MD;  Location: [place];  Service: Vascular;  Laterality: Right;  . Hemocatheter    . IR GENERIC HISTORICAL Right 04/29/2016   IR  THROMBECTOMY AV FISTULA W/THROMBOLYSIS/PTA INC/SHUNT/IMG RIGHT 04/29/2016 Arne [place], MD [facility]INTER[facility] RAD  . IR GENERIC HISTORICAL  04/29/2016   IR US GUIDE VASC ACCESS RIGHT 04/29/2016 Arne [place], MD [facility]  . IR THORACENTESIS ASP PLEURAL SPACE W/IMG GUIDE  12/06/2016  . KNEE ARTHROSCOPY Bilateral    bilateral  . KNEE ARTHROSCOPY WITH MEDIAL MENISECTOMY Left 02/26/2013   Procedure: KNEE ARTHROSCOPY WITH MEDIAL MENISECTOMY;  Surgeon: Garald Balding, MD;  Location: [place];  Service: Orthopedics;  Laterality: Left;  . LIGATION OF COMPETING BRANCHES OF ARTERIOVENOUS FISTULA Right  10/30/2015   Procedure: LIGATION OF COMPETING BRANCHES OF BRACHIOCEPHALIC ARTERIOVENOUS FISTULA;  Surgeon: Angelia Mould, MD;  Location: [place];  Service: Vascular;  Laterality: Right;  . NEPHRECTOMY Right September 12, 2012  . NEPHRECTOMY Left 04-06-2015   [place] Right 09/07/2015   Procedure: Fistulagram;  Surgeon: Angelia Mould, MD;  Location: [place] [facility];  Service: Cardiovascular;  Laterality: Right;  ARM  . PERIPHERAL VASCULAR CATHETERIZATION Right 09/07/2015   Procedure: Peripheral Vascular Balloon Angioplasty;  Surgeon: Angelia Mould, MD;  Location: [place] [facility];  Service: Cardiovascular;  Laterality: Right;  upper arm venous  . PROSTATECTOMY  04-06-2015   [place] Bilateral    bilateral  . TOTAL KNEE ARTHROPLASTY  01/24/2012   Procedure: TOTAL KNEE ARTHROPLASTY;  Surgeon: Garald Balding, MD;  Location: [place];  Service: Orthopedics;  Laterality: Right;  RIGHT TOTAL KNEE REPLACEMENT    Family Psychiatric History: I have reviewed the patient's family history in detail and updated the patient record.  Family History:  Family History  Problem Relation Age of Onset  . Cancer Mother   . Hypertension Father   . Atrial fibrillation Father   . Heart disease Father     Social History:  Social History   Socioeconomic History  . Marital status: Widowed    Spouse name: Not on file  . Number of children: Not on file  . Years of education: Not on file  . Highest education level: Not on file  Occupational History  . Not on file  Social Needs  . Financial resource strain: Not on file  . Food insecurity:    Worry: Not on file    Inability: Not on file  . Transportation needs:    Medical: Not on file    Non-medical: Not on file  Tobacco Use  . Smoking status: Never Smoker  . Smokeless tobacco: Never Used  Substance and Sexual Activity  . Alcohol  use: No    Alcohol/week: 0.0 oz  . Drug use: No  . Sexual activity: Not on file  Lifestyle  . Physical activity:    Days per week: Not on file    Minutes per session: Not on file  . Stress: Not on file  Relationships  . Social connections:    Talks on phone: Not on file    Gets together: Not on file    Attends religious service: Not on file    Active member of club or organization: Not on file    Attends meetings of clubs or organizations: Not on file    Relationship status: Not on file  Other Topics Concern  . Not on file  Social History Narrative   Lives alone. Manages well per patient report. Does not cook much. Eats out a lot. Eats all food groups. Does not eat much fruit.  Married in the past. Has one son. Used to drive a truck.     Allergies: No Known Allergies  Metabolic Disorder Labs: No results found for: HGBA1C, MPG No results found for: PROLACTIN No results found for: CHOL, TRIG, HDL, CHOLHDL, VLDL, LDLCALC Lab Results  Component Value Date   TSH 3.97 03/14/2017    Therapeutic Level Labs: No results found for: LITHIUM No results found for: VALPROATE No components found for:  CBMZ  Current Medications: Current Outpatient Medications  Medication Sig Dispense Refill  . amLODipine (NORVASC) 10 MG tablet     . clobetasol cream (TEMOVATE) 0.73 % Apply 1 application topically 2 (two) times daily.    . fluticasone (FLONASE) 50 MCG/ACT nasal spray Place 1 spray into both nostrils daily.     Marland Kitchen LORazepam (ATIVAN) 1 MG tablet Take 1 tablet (1 mg total) by mouth daily as needed for anxiety. 30 tablet 1  . metoprolol tartrate (LOPRESSOR) 25 MG tablet Take 37.5 mg by mouth 2 (two) times daily.    . mirtazapine (REMERON) 15 MG tablet Take 1 tablet (15 mg total) by mouth at bedtime. 30 tablet 0  . Polyethyl Glycol-Propyl Glycol (SYSTANE OP) Place 1 drop into both eyes daily as needed (for dry, itchy eyes).     No current facility-administered medications for this visit.       Musculoskeletal: Strength & Muscle Tone: within normal limits Gait & Station: normal Patient leans: N/A  Psychiatric Specialty Exam: Review of Systems  Psychiatric/Behavioral: Positive for depression. Negative for hallucinations, memory loss, substance abuse and suicidal ideas. The patient is nervous/anxious and has insomnia.   All other systems reviewed and are negative.   Blood pressure 108/65, pulse 74, height 5\' 10"  (1.778 m), weight 244 lb (110.7 kg), SpO2 98 %.Body mass index is 35.01 kg/m.  General Appearance: Fairly Groomed  Eye Contact:  Good  Speech:  Clear and Coherent  Volume:  Normal  Mood:  "tired"  Affect:  Restricted and irritable  Thought Process:  Coherent  Orientation:  Full (Time, Place, and Person)  Thought Content: Logical   Suicidal Thoughts:  No  Homicidal Thoughts:  No  Memory:  Immediate;   Good  Judgement:  Poor  Insight:  Shallow  Psychomotor Activity:  Normal  Concentration:  Concentration: Good and Attention Span: Good  Recall:  Good  Fund of Knowledge: Good  Language: Good  Akathisia:  No  Handed:  Right  AIMS (if indicated): not done  Assets:  Communication Skills Desire for Improvement  ADL's:  Intact  Cognition: WNL  Sleep:  Poor   Screenings: PHQ2-9     Office Visit from 03/07/2017 in [facility] Office Visit from 11/02/2016 in [facility]  PHQ-2 Total Score  6  0  PHQ-9 Total Score  18  -       Assessment and Plan:  BROGEN DUELL is a 67 y.o. year old male with a history of depression, hypertension,ESRD s/pbilateral nephrectomies for renal cell carcinoma, bladder cancer,Cervical spondylosis without myelopathy,anemia, who presents for follow up appointment for Current moderate episode of major depressive disorder without prior episode ([place])  # MDD, moderate, single episode without psychotic features # Unspecified anxiety disorder  # r/o MDD due to another medical condition Patient fixates on  getting xanax for insomnia and he has been taking both ativan and xanax at times (he does not quantify). Noted that there is markedly different from the previous encounter as the patient was less anxious, depressed  after starting mirtazapine. Discussed in length about potential adverse reaction of significant drowsiness and respiratory suppression especially with concomitant use of xanax and ativan.  The patient is not amenable to this advise, and states that he will get medication as he did dangerous things in the past (referring that he used to be drinking despite his renal problems). Given he is not amenable to the treatment plan, he will be discharged from the clinic. He is advised to find a psychiatrist in the area. Will continue mirtazapine for depression. Will not prescribe ativan anymore given misuse of medication/it was filled on 4/22. He is advised to go to urgent care/emergency room if any signs of withdrawal symptoms.   Plan 1. Continue mirtazapine 15 mg at night  2. Will not prescribe ativan or Xanax as we discussed  3. Find other provider to continue your treatment   The patient demonstrates the following risk factors for suicide: Chronic risk factors for suicide include:psychiatric disorder ofdepression, anxietyand chronic pain. Acute risk factorsfor suicide include: unemployment and social withdrawal/isolation. Protective factorsfor this patient include: coping skills and hope for the future. Considering these factors, the overall suicide risk at this point appears to below. Patientisappropriate for outpatient follow up.  Norman Clay, MD 06/28/2017, 4:39 PM

## 2017-06-28 ENCOUNTER — Encounter (HOSPITAL_COMMUNITY): Payer: Self-pay | Admitting: Psychiatry

## 2017-06-28 ENCOUNTER — Ambulatory Visit (INDEPENDENT_AMBULATORY_CARE_PROVIDER_SITE_OTHER): Payer: Medicare Other | Admitting: Psychiatry

## 2017-06-28 VITALS — BP 108/65 | HR 74 | Ht 70.0 in | Wt 244.0 lb

## 2017-06-28 DIAGNOSIS — F321 Major depressive disorder, single episode, moderate: Secondary | ICD-10-CM | POA: Diagnosis not present

## 2017-06-28 DIAGNOSIS — G4700 Insomnia, unspecified: Secondary | ICD-10-CM

## 2017-06-28 DIAGNOSIS — R4584 Anhedonia: Secondary | ICD-10-CM | POA: Diagnosis not present

## 2017-06-28 DIAGNOSIS — Z112 Encounter for screening for other bacterial diseases: Secondary | ICD-10-CM | POA: Diagnosis not present

## 2017-06-28 DIAGNOSIS — R5383 Other fatigue: Secondary | ICD-10-CM

## 2017-06-28 DIAGNOSIS — Z992 Dependence on renal dialysis: Secondary | ICD-10-CM | POA: Diagnosis not present

## 2017-06-28 DIAGNOSIS — R450 Nervousness: Secondary | ICD-10-CM

## 2017-06-28 DIAGNOSIS — N186 End stage renal disease: Secondary | ICD-10-CM | POA: Diagnosis not present

## 2017-06-28 DIAGNOSIS — F419 Anxiety disorder, unspecified: Secondary | ICD-10-CM

## 2017-06-28 DIAGNOSIS — D509 Iron deficiency anemia, unspecified: Secondary | ICD-10-CM | POA: Diagnosis not present

## 2017-06-28 DIAGNOSIS — D631 Anemia in chronic kidney disease: Secondary | ICD-10-CM | POA: Diagnosis not present

## 2017-06-28 DIAGNOSIS — N2581 Secondary hyperparathyroidism of renal origin: Secondary | ICD-10-CM | POA: Diagnosis not present

## 2017-06-28 HISTORY — DX: Major depressive disorder, single episode, moderate: F32.1

## 2017-06-28 MED ORDER — MIRTAZAPINE 15 MG PO TABS: 15.0000 mg | ORAL_TABLET | Freq: Every day | ORAL | 0 refills | Status: DC

## 2017-06-28 NOTE — Patient Instructions (Addendum)
1. Continue mirtazapine 15 mg at night  2. Will not prescribe ativan or Xanax as we discussed  3. Find other provider to continue your treatment

## 2017-06-29 DIAGNOSIS — Z112 Encounter for screening for other bacterial diseases: Secondary | ICD-10-CM | POA: Diagnosis not present

## 2017-06-29 DIAGNOSIS — N186 End stage renal disease: Secondary | ICD-10-CM | POA: Diagnosis not present

## 2017-06-29 DIAGNOSIS — Z992 Dependence on renal dialysis: Secondary | ICD-10-CM | POA: Diagnosis not present

## 2017-06-29 DIAGNOSIS — D631 Anemia in chronic kidney disease: Secondary | ICD-10-CM | POA: Diagnosis not present

## 2017-06-29 DIAGNOSIS — N2581 Secondary hyperparathyroidism of renal origin: Secondary | ICD-10-CM | POA: Diagnosis not present

## 2017-06-29 DIAGNOSIS — D509 Iron deficiency anemia, unspecified: Secondary | ICD-10-CM | POA: Diagnosis not present

## 2017-06-30 DIAGNOSIS — N186 End stage renal disease: Secondary | ICD-10-CM | POA: Diagnosis not present

## 2017-06-30 DIAGNOSIS — D509 Iron deficiency anemia, unspecified: Secondary | ICD-10-CM | POA: Diagnosis not present

## 2017-06-30 DIAGNOSIS — Z992 Dependence on renal dialysis: Secondary | ICD-10-CM | POA: Diagnosis not present

## 2017-06-30 DIAGNOSIS — N2581 Secondary hyperparathyroidism of renal origin: Secondary | ICD-10-CM | POA: Diagnosis not present

## 2017-06-30 DIAGNOSIS — D631 Anemia in chronic kidney disease: Secondary | ICD-10-CM | POA: Diagnosis not present

## 2017-06-30 DIAGNOSIS — Z112 Encounter for screening for other bacterial diseases: Secondary | ICD-10-CM | POA: Diagnosis not present

## 2017-07-01 DIAGNOSIS — D509 Iron deficiency anemia, unspecified: Secondary | ICD-10-CM | POA: Diagnosis not present

## 2017-07-01 DIAGNOSIS — Z112 Encounter for screening for other bacterial diseases: Secondary | ICD-10-CM | POA: Diagnosis not present

## 2017-07-01 DIAGNOSIS — D631 Anemia in chronic kidney disease: Secondary | ICD-10-CM | POA: Diagnosis not present

## 2017-07-01 DIAGNOSIS — N186 End stage renal disease: Secondary | ICD-10-CM | POA: Diagnosis not present

## 2017-07-01 DIAGNOSIS — N2581 Secondary hyperparathyroidism of renal origin: Secondary | ICD-10-CM | POA: Diagnosis not present

## 2017-07-01 DIAGNOSIS — Z992 Dependence on renal dialysis: Secondary | ICD-10-CM | POA: Diagnosis not present

## 2017-07-02 DIAGNOSIS — Z992 Dependence on renal dialysis: Secondary | ICD-10-CM | POA: Diagnosis not present

## 2017-07-02 DIAGNOSIS — D631 Anemia in chronic kidney disease: Secondary | ICD-10-CM | POA: Diagnosis not present

## 2017-07-02 DIAGNOSIS — N2581 Secondary hyperparathyroidism of renal origin: Secondary | ICD-10-CM | POA: Diagnosis not present

## 2017-07-02 DIAGNOSIS — D509 Iron deficiency anemia, unspecified: Secondary | ICD-10-CM | POA: Diagnosis not present

## 2017-07-02 DIAGNOSIS — N186 End stage renal disease: Secondary | ICD-10-CM | POA: Diagnosis not present

## 2017-07-02 DIAGNOSIS — Z112 Encounter for screening for other bacterial diseases: Secondary | ICD-10-CM | POA: Diagnosis not present

## 2017-07-03 DIAGNOSIS — N186 End stage renal disease: Secondary | ICD-10-CM | POA: Diagnosis not present

## 2017-07-03 DIAGNOSIS — D509 Iron deficiency anemia, unspecified: Secondary | ICD-10-CM | POA: Diagnosis not present

## 2017-07-03 DIAGNOSIS — Z112 Encounter for screening for other bacterial diseases: Secondary | ICD-10-CM | POA: Diagnosis not present

## 2017-07-03 DIAGNOSIS — Z992 Dependence on renal dialysis: Secondary | ICD-10-CM | POA: Diagnosis not present

## 2017-07-03 DIAGNOSIS — N2581 Secondary hyperparathyroidism of renal origin: Secondary | ICD-10-CM | POA: Diagnosis not present

## 2017-07-03 DIAGNOSIS — D631 Anemia in chronic kidney disease: Secondary | ICD-10-CM | POA: Diagnosis not present

## 2017-07-04 ENCOUNTER — Ambulatory Visit: Payer: Self-pay | Admitting: Family Medicine

## 2017-07-04 DIAGNOSIS — N2581 Secondary hyperparathyroidism of renal origin: Secondary | ICD-10-CM | POA: Diagnosis not present

## 2017-07-04 DIAGNOSIS — Z992 Dependence on renal dialysis: Secondary | ICD-10-CM | POA: Diagnosis not present

## 2017-07-04 DIAGNOSIS — D631 Anemia in chronic kidney disease: Secondary | ICD-10-CM | POA: Diagnosis not present

## 2017-07-04 DIAGNOSIS — N186 End stage renal disease: Secondary | ICD-10-CM | POA: Diagnosis not present

## 2017-07-04 DIAGNOSIS — Z112 Encounter for screening for other bacterial diseases: Secondary | ICD-10-CM | POA: Diagnosis not present

## 2017-07-04 DIAGNOSIS — D509 Iron deficiency anemia, unspecified: Secondary | ICD-10-CM | POA: Diagnosis not present

## 2017-07-04 NOTE — Progress Notes (Signed)
    Established Dialysis Access   History of Present Illness   Jesus [name] is a 67 y.o. (1950-08-12) male who presents for re-evaluation for permanent access.  The patient is right hand dominant.  Previous access procedures have been completed in the right arm by Dr. Scot Dock.  The patient's complication from previous access procedures include: thrombosis.  I had previously seen this patient on 10/26/16 and recommended L arm AVF placement.  He was scheduled but never followed through with his scheduled procedure.  The patient has never had a previous PPM placed.  Pt is currently on Peritoneal dialysis.  The patient's PMH, PSH, SH, and FamHx were reviewed on 07/05/17 are unchanged from 10/26/16.  Current Outpatient Medications  Medication Sig Dispense Refill  . amLODipine (NORVASC) 10 MG tablet     . clobetasol cream (TEMOVATE) 3.33 % Apply 1 application topically 2 (two) times daily.    . fluticasone (FLONASE) 50 MCG/ACT nasal spray Place 1 spray into both nostrils daily.     Marland Kitchen LORazepam (ATIVAN) 1 MG tablet Take 1 tablet (1 mg total) by mouth daily as needed for anxiety. 30 tablet 1  . metoprolol tartrate (LOPRESSOR) 25 MG tablet Take 37.5 mg by mouth 2 (two) times daily.    . mirtazapine (REMERON) 15 MG tablet Take 1 tablet (15 mg total) by mouth at bedtime. 30 tablet 0  . Polyethyl Glycol-Propyl Glycol (SYSTANE OP) Place 1 drop into both eyes daily as needed (for dry, itchy eyes).     No current facility-administered medications for this visit.     On ROS today: +PD cath, no steal sx   Physical Examination   Vitals:   07/05/17 1005  BP: 117/74  Pulse: 68  RR 12  There is no height or weight on file to calculate BMI.  General Alert, O x 3, WD, NAD  Pulmonary Sym exp, good B air movt, CTA B  Cardiac RRR, Nl S1, S2, no Murmurs, No rubs, No S3,S4  Vascular Vessel Right Left  Radial Faintly palpable Palpable  Brachial Palpable Palpable  Ulnar Not palpable Not palpable      Musculo- skeletal M/S 5/5 throughout  , Extremities without ischemic changes, healed incision in R arm, no thrill, no bruit  Neurologic Pain and light touch intact in extremities , Motor exam as listed above    Medical Decision Making   Jesus [name] is a 67 y.o. male who presents with ESRD on peritoneal dialysis requiring backup fistula for hemodialysis.    I previously offered the patient a L RC vs BC AVF.  He was scheduled but the patient canceled.  I had an extensive discussion with this patient in regards to the nature of access surgery, including risk, benefits, and alternatives.    The patient is aware that the risks of access surgery include but are not limited to: bleeding, infection, steal syndrome, nerve damage, ischemic monomelic neuropathy, failure of access to mature, and possible need for additional access procedures in the future.  The patient has agreed to proceed with the above procedure which will be scheduled 17 MAY 19.Adele Barthel, MD, FACS [facility] Office: [phone] Pager: [phone]

## 2017-07-04 NOTE — H&P (View-Only) (Signed)
    Established Dialysis Access   History of Present Illness   Jesus [name] is a 67 y.o. ([birth year]) male who presents for re-evaluation for permanent access.  The patient is right hand dominant.  Previous access procedures have been completed in the right arm by Dr. Scot Dock.  The patient's complication from previous access procedures include: thrombosis.  I had previously seen this patient on 10/26/16 and recommended L arm AVF placement.  He was scheduled but never followed through with his scheduled procedure.  The patient has never had a previous PPM placed.  Pt is currently on Peritoneal dialysis.  The patient's PMH, PSH, SH, and FamHx were reviewed on 07/05/17 are unchanged from 10/26/16.  Current Outpatient Medications  Medication Sig Dispense Refill  . amLODipine (NORVASC) 10 MG tablet     . clobetasol cream (TEMOVATE) 8.10 % Apply 1 application topically 2 (two) times daily.    . fluticasone (FLONASE) 50 MCG/ACT nasal spray Place 1 spray into both nostrils daily.     Marland Kitchen LORazepam (ATIVAN) 1 MG tablet Take 1 tablet (1 mg total) by mouth daily as needed for anxiety. 30 tablet 1  . metoprolol tartrate (LOPRESSOR) 25 MG tablet Take 37.5 mg by mouth 2 (two) times daily.    . mirtazapine (REMERON) 15 MG tablet Take 1 tablet (15 mg total) by mouth at bedtime. 30 tablet 0  . Polyethyl Glycol-Propyl Glycol (SYSTANE OP) Place 1 drop into both eyes daily as needed (for dry, itchy eyes).     No current facility-administered medications for this visit.     On ROS today: +PD cath, no steal sx   Physical Examination   Vitals:   07/05/17 1005  BP: 117/74  Pulse: 68  RR 12  There is no height or weight on file to calculate BMI.  General Alert, O x 3, WD, NAD  Pulmonary Sym exp, good B air movt, CTA B  Cardiac RRR, Nl S1, S2, no Murmurs, No rubs, No S3,S4  Vascular Vessel Right Left  Radial Faintly palpable Palpable  Brachial Palpable Palpable  Ulnar Not palpable Not palpable      Musculo- skeletal M/S 5/5 throughout  , Extremities without ischemic changes, healed incision in R arm, no thrill, no bruit  Neurologic Pain and light touch intact in extremities , Motor exam as listed above    Medical Decision Making   TAYSOM GLYMPH is a 67 y.o. male who presents with ESRD on peritoneal dialysis requiring backup fistula for hemodialysis.    I previously offered the patient a L RC vs BC AVF.  He was scheduled but the patient canceled.  I had an extensive discussion with this patient in regards to the nature of access surgery, including risk, benefits, and alternatives.    The patient is aware that the risks of access surgery include but are not limited to: bleeding, infection, steal syndrome, nerve damage, ischemic monomelic neuropathy, failure of access to mature, and possible need for additional access procedures in the future.  The patient has agreed to proceed with the above procedure which will be scheduled 17 MAY 19.Adele Barthel, MD, FACS [facility] [place] Office: [phone] Pager: [phone]

## 2017-07-05 ENCOUNTER — Other Ambulatory Visit: Payer: Self-pay

## 2017-07-05 ENCOUNTER — Other Ambulatory Visit: Payer: Self-pay | Admitting: *Deleted

## 2017-07-05 ENCOUNTER — Encounter: Payer: Self-pay | Admitting: *Deleted

## 2017-07-05 ENCOUNTER — Encounter: Payer: Self-pay | Admitting: Vascular Surgery

## 2017-07-05 ENCOUNTER — Ambulatory Visit (INDEPENDENT_AMBULATORY_CARE_PROVIDER_SITE_OTHER): Payer: Medicare Other | Admitting: Vascular Surgery

## 2017-07-05 VITALS — BP 117/74 | HR 68

## 2017-07-05 DIAGNOSIS — D631 Anemia in chronic kidney disease: Secondary | ICD-10-CM | POA: Diagnosis not present

## 2017-07-05 DIAGNOSIS — D509 Iron deficiency anemia, unspecified: Secondary | ICD-10-CM | POA: Diagnosis not present

## 2017-07-05 DIAGNOSIS — N186 End stage renal disease: Secondary | ICD-10-CM

## 2017-07-05 DIAGNOSIS — Z992 Dependence on renal dialysis: Secondary | ICD-10-CM

## 2017-07-05 DIAGNOSIS — N2581 Secondary hyperparathyroidism of renal origin: Secondary | ICD-10-CM | POA: Diagnosis not present

## 2017-07-05 DIAGNOSIS — Z112 Encounter for screening for other bacterial diseases: Secondary | ICD-10-CM | POA: Diagnosis not present

## 2017-07-06 DIAGNOSIS — Z112 Encounter for screening for other bacterial diseases: Secondary | ICD-10-CM | POA: Diagnosis not present

## 2017-07-06 DIAGNOSIS — N186 End stage renal disease: Secondary | ICD-10-CM | POA: Diagnosis not present

## 2017-07-06 DIAGNOSIS — Z992 Dependence on renal dialysis: Secondary | ICD-10-CM | POA: Diagnosis not present

## 2017-07-06 DIAGNOSIS — D631 Anemia in chronic kidney disease: Secondary | ICD-10-CM | POA: Diagnosis not present

## 2017-07-06 DIAGNOSIS — N2581 Secondary hyperparathyroidism of renal origin: Secondary | ICD-10-CM | POA: Diagnosis not present

## 2017-07-06 DIAGNOSIS — D509 Iron deficiency anemia, unspecified: Secondary | ICD-10-CM | POA: Diagnosis not present

## 2017-07-07 DIAGNOSIS — Z992 Dependence on renal dialysis: Secondary | ICD-10-CM | POA: Diagnosis not present

## 2017-07-07 DIAGNOSIS — D509 Iron deficiency anemia, unspecified: Secondary | ICD-10-CM | POA: Diagnosis not present

## 2017-07-07 DIAGNOSIS — N2581 Secondary hyperparathyroidism of renal origin: Secondary | ICD-10-CM | POA: Diagnosis not present

## 2017-07-07 DIAGNOSIS — Z112 Encounter for screening for other bacterial diseases: Secondary | ICD-10-CM | POA: Diagnosis not present

## 2017-07-07 DIAGNOSIS — N186 End stage renal disease: Secondary | ICD-10-CM | POA: Diagnosis not present

## 2017-07-07 DIAGNOSIS — D631 Anemia in chronic kidney disease: Secondary | ICD-10-CM | POA: Diagnosis not present

## 2017-07-08 DIAGNOSIS — D631 Anemia in chronic kidney disease: Secondary | ICD-10-CM | POA: Diagnosis not present

## 2017-07-08 DIAGNOSIS — Z112 Encounter for screening for other bacterial diseases: Secondary | ICD-10-CM | POA: Diagnosis not present

## 2017-07-08 DIAGNOSIS — N186 End stage renal disease: Secondary | ICD-10-CM | POA: Diagnosis not present

## 2017-07-08 DIAGNOSIS — N2581 Secondary hyperparathyroidism of renal origin: Secondary | ICD-10-CM | POA: Diagnosis not present

## 2017-07-08 DIAGNOSIS — Z992 Dependence on renal dialysis: Secondary | ICD-10-CM | POA: Diagnosis not present

## 2017-07-08 DIAGNOSIS — D509 Iron deficiency anemia, unspecified: Secondary | ICD-10-CM | POA: Diagnosis not present

## 2017-07-09 DIAGNOSIS — Z112 Encounter for screening for other bacterial diseases: Secondary | ICD-10-CM | POA: Diagnosis not present

## 2017-07-09 DIAGNOSIS — N2581 Secondary hyperparathyroidism of renal origin: Secondary | ICD-10-CM | POA: Diagnosis not present

## 2017-07-09 DIAGNOSIS — Z992 Dependence on renal dialysis: Secondary | ICD-10-CM | POA: Diagnosis not present

## 2017-07-09 DIAGNOSIS — N186 End stage renal disease: Secondary | ICD-10-CM | POA: Diagnosis not present

## 2017-07-09 DIAGNOSIS — D631 Anemia in chronic kidney disease: Secondary | ICD-10-CM | POA: Diagnosis not present

## 2017-07-09 DIAGNOSIS — D509 Iron deficiency anemia, unspecified: Secondary | ICD-10-CM | POA: Diagnosis not present

## 2017-07-10 ENCOUNTER — Telehealth: Payer: Self-pay | Admitting: *Deleted

## 2017-07-10 DIAGNOSIS — Z992 Dependence on renal dialysis: Secondary | ICD-10-CM | POA: Diagnosis not present

## 2017-07-10 DIAGNOSIS — D509 Iron deficiency anemia, unspecified: Secondary | ICD-10-CM | POA: Diagnosis not present

## 2017-07-10 DIAGNOSIS — N2581 Secondary hyperparathyroidism of renal origin: Secondary | ICD-10-CM | POA: Diagnosis not present

## 2017-07-10 DIAGNOSIS — N186 End stage renal disease: Secondary | ICD-10-CM | POA: Diagnosis not present

## 2017-07-10 DIAGNOSIS — D631 Anemia in chronic kidney disease: Secondary | ICD-10-CM | POA: Diagnosis not present

## 2017-07-10 DIAGNOSIS — Z112 Encounter for screening for other bacterial diseases: Secondary | ICD-10-CM | POA: Diagnosis not present

## 2017-07-10 NOTE — Telephone Encounter (Signed)
Patient called with surgery date change. To be at [facility] admitting department at 9:30 am on 07/25/17. Reminded to follow the detailed instructions received from the pre-admission department about this surgery.

## 2017-07-11 DIAGNOSIS — Z112 Encounter for screening for other bacterial diseases: Secondary | ICD-10-CM | POA: Diagnosis not present

## 2017-07-11 DIAGNOSIS — D631 Anemia in chronic kidney disease: Secondary | ICD-10-CM | POA: Diagnosis not present

## 2017-07-11 DIAGNOSIS — Z992 Dependence on renal dialysis: Secondary | ICD-10-CM | POA: Diagnosis not present

## 2017-07-11 DIAGNOSIS — N186 End stage renal disease: Secondary | ICD-10-CM | POA: Diagnosis not present

## 2017-07-11 DIAGNOSIS — D509 Iron deficiency anemia, unspecified: Secondary | ICD-10-CM | POA: Diagnosis not present

## 2017-07-11 DIAGNOSIS — N2581 Secondary hyperparathyroidism of renal origin: Secondary | ICD-10-CM | POA: Diagnosis not present

## 2017-07-12 DIAGNOSIS — Z112 Encounter for screening for other bacterial diseases: Secondary | ICD-10-CM | POA: Diagnosis not present

## 2017-07-12 DIAGNOSIS — D509 Iron deficiency anemia, unspecified: Secondary | ICD-10-CM | POA: Diagnosis not present

## 2017-07-12 DIAGNOSIS — Z992 Dependence on renal dialysis: Secondary | ICD-10-CM | POA: Diagnosis not present

## 2017-07-12 DIAGNOSIS — N2581 Secondary hyperparathyroidism of renal origin: Secondary | ICD-10-CM | POA: Diagnosis not present

## 2017-07-12 DIAGNOSIS — D631 Anemia in chronic kidney disease: Secondary | ICD-10-CM | POA: Diagnosis not present

## 2017-07-12 DIAGNOSIS — N186 End stage renal disease: Secondary | ICD-10-CM | POA: Diagnosis not present

## 2017-07-13 DIAGNOSIS — Z992 Dependence on renal dialysis: Secondary | ICD-10-CM | POA: Diagnosis not present

## 2017-07-13 DIAGNOSIS — N2581 Secondary hyperparathyroidism of renal origin: Secondary | ICD-10-CM | POA: Diagnosis not present

## 2017-07-13 DIAGNOSIS — N186 End stage renal disease: Secondary | ICD-10-CM | POA: Diagnosis not present

## 2017-07-13 DIAGNOSIS — D631 Anemia in chronic kidney disease: Secondary | ICD-10-CM | POA: Diagnosis not present

## 2017-07-13 DIAGNOSIS — Z112 Encounter for screening for other bacterial diseases: Secondary | ICD-10-CM | POA: Diagnosis not present

## 2017-07-13 DIAGNOSIS — D509 Iron deficiency anemia, unspecified: Secondary | ICD-10-CM | POA: Diagnosis not present

## 2017-07-14 ENCOUNTER — Other Ambulatory Visit: Payer: Self-pay | Admitting: Nurse Practitioner

## 2017-07-14 DIAGNOSIS — D631 Anemia in chronic kidney disease: Secondary | ICD-10-CM | POA: Diagnosis not present

## 2017-07-14 DIAGNOSIS — N2581 Secondary hyperparathyroidism of renal origin: Secondary | ICD-10-CM | POA: Diagnosis not present

## 2017-07-14 DIAGNOSIS — Z992 Dependence on renal dialysis: Secondary | ICD-10-CM | POA: Diagnosis not present

## 2017-07-14 DIAGNOSIS — N186 End stage renal disease: Secondary | ICD-10-CM | POA: Diagnosis not present

## 2017-07-14 DIAGNOSIS — D509 Iron deficiency anemia, unspecified: Secondary | ICD-10-CM | POA: Diagnosis not present

## 2017-07-14 DIAGNOSIS — M47812 Spondylosis without myelopathy or radiculopathy, cervical region: Secondary | ICD-10-CM

## 2017-07-14 DIAGNOSIS — Z112 Encounter for screening for other bacterial diseases: Secondary | ICD-10-CM | POA: Diagnosis not present

## 2017-07-15 DIAGNOSIS — D509 Iron deficiency anemia, unspecified: Secondary | ICD-10-CM | POA: Diagnosis not present

## 2017-07-15 DIAGNOSIS — D631 Anemia in chronic kidney disease: Secondary | ICD-10-CM | POA: Diagnosis not present

## 2017-07-15 DIAGNOSIS — N186 End stage renal disease: Secondary | ICD-10-CM | POA: Diagnosis not present

## 2017-07-15 DIAGNOSIS — Z112 Encounter for screening for other bacterial diseases: Secondary | ICD-10-CM | POA: Diagnosis not present

## 2017-07-15 DIAGNOSIS — Z992 Dependence on renal dialysis: Secondary | ICD-10-CM | POA: Diagnosis not present

## 2017-07-15 DIAGNOSIS — N2581 Secondary hyperparathyroidism of renal origin: Secondary | ICD-10-CM | POA: Diagnosis not present

## 2017-07-16 DIAGNOSIS — D631 Anemia in chronic kidney disease: Secondary | ICD-10-CM | POA: Diagnosis not present

## 2017-07-16 DIAGNOSIS — N186 End stage renal disease: Secondary | ICD-10-CM | POA: Diagnosis not present

## 2017-07-16 DIAGNOSIS — Z112 Encounter for screening for other bacterial diseases: Secondary | ICD-10-CM | POA: Diagnosis not present

## 2017-07-16 DIAGNOSIS — N2581 Secondary hyperparathyroidism of renal origin: Secondary | ICD-10-CM | POA: Diagnosis not present

## 2017-07-16 DIAGNOSIS — D509 Iron deficiency anemia, unspecified: Secondary | ICD-10-CM | POA: Diagnosis not present

## 2017-07-16 DIAGNOSIS — Z992 Dependence on renal dialysis: Secondary | ICD-10-CM | POA: Diagnosis not present

## 2017-07-17 DIAGNOSIS — Z112 Encounter for screening for other bacterial diseases: Secondary | ICD-10-CM | POA: Diagnosis not present

## 2017-07-17 DIAGNOSIS — Z992 Dependence on renal dialysis: Secondary | ICD-10-CM | POA: Diagnosis not present

## 2017-07-17 DIAGNOSIS — D631 Anemia in chronic kidney disease: Secondary | ICD-10-CM | POA: Diagnosis not present

## 2017-07-17 DIAGNOSIS — N186 End stage renal disease: Secondary | ICD-10-CM | POA: Diagnosis not present

## 2017-07-17 DIAGNOSIS — D509 Iron deficiency anemia, unspecified: Secondary | ICD-10-CM | POA: Diagnosis not present

## 2017-07-17 DIAGNOSIS — N2581 Secondary hyperparathyroidism of renal origin: Secondary | ICD-10-CM | POA: Diagnosis not present

## 2017-07-18 DIAGNOSIS — Z992 Dependence on renal dialysis: Secondary | ICD-10-CM | POA: Diagnosis not present

## 2017-07-18 DIAGNOSIS — N2581 Secondary hyperparathyroidism of renal origin: Secondary | ICD-10-CM | POA: Diagnosis not present

## 2017-07-18 DIAGNOSIS — N186 End stage renal disease: Secondary | ICD-10-CM | POA: Diagnosis not present

## 2017-07-18 DIAGNOSIS — Z112 Encounter for screening for other bacterial diseases: Secondary | ICD-10-CM | POA: Diagnosis not present

## 2017-07-18 DIAGNOSIS — D631 Anemia in chronic kidney disease: Secondary | ICD-10-CM | POA: Diagnosis not present

## 2017-07-18 DIAGNOSIS — D509 Iron deficiency anemia, unspecified: Secondary | ICD-10-CM | POA: Diagnosis not present

## 2017-07-19 DIAGNOSIS — D509 Iron deficiency anemia, unspecified: Secondary | ICD-10-CM | POA: Diagnosis not present

## 2017-07-19 DIAGNOSIS — N2581 Secondary hyperparathyroidism of renal origin: Secondary | ICD-10-CM | POA: Diagnosis not present

## 2017-07-19 DIAGNOSIS — D631 Anemia in chronic kidney disease: Secondary | ICD-10-CM | POA: Diagnosis not present

## 2017-07-19 DIAGNOSIS — Z112 Encounter for screening for other bacterial diseases: Secondary | ICD-10-CM | POA: Diagnosis not present

## 2017-07-19 DIAGNOSIS — N186 End stage renal disease: Secondary | ICD-10-CM | POA: Diagnosis not present

## 2017-07-19 DIAGNOSIS — Z992 Dependence on renal dialysis: Secondary | ICD-10-CM | POA: Diagnosis not present

## 2017-07-20 ENCOUNTER — Other Ambulatory Visit: Payer: Self-pay

## 2017-07-20 DIAGNOSIS — D631 Anemia in chronic kidney disease: Secondary | ICD-10-CM | POA: Diagnosis not present

## 2017-07-20 DIAGNOSIS — N2581 Secondary hyperparathyroidism of renal origin: Secondary | ICD-10-CM | POA: Diagnosis not present

## 2017-07-20 DIAGNOSIS — Z992 Dependence on renal dialysis: Secondary | ICD-10-CM | POA: Diagnosis not present

## 2017-07-20 DIAGNOSIS — D509 Iron deficiency anemia, unspecified: Secondary | ICD-10-CM | POA: Diagnosis not present

## 2017-07-20 DIAGNOSIS — Z112 Encounter for screening for other bacterial diseases: Secondary | ICD-10-CM | POA: Diagnosis not present

## 2017-07-20 DIAGNOSIS — N186 End stage renal disease: Secondary | ICD-10-CM | POA: Diagnosis not present

## 2017-07-21 ENCOUNTER — Encounter (HOSPITAL_COMMUNITY): Payer: Self-pay | Admitting: *Deleted

## 2017-07-21 ENCOUNTER — Other Ambulatory Visit: Payer: Self-pay

## 2017-07-21 DIAGNOSIS — D631 Anemia in chronic kidney disease: Secondary | ICD-10-CM | POA: Diagnosis not present

## 2017-07-21 DIAGNOSIS — D509 Iron deficiency anemia, unspecified: Secondary | ICD-10-CM | POA: Diagnosis not present

## 2017-07-21 DIAGNOSIS — N186 End stage renal disease: Secondary | ICD-10-CM | POA: Diagnosis not present

## 2017-07-21 DIAGNOSIS — Z112 Encounter for screening for other bacterial diseases: Secondary | ICD-10-CM | POA: Diagnosis not present

## 2017-07-21 DIAGNOSIS — N2581 Secondary hyperparathyroidism of renal origin: Secondary | ICD-10-CM | POA: Diagnosis not present

## 2017-07-21 DIAGNOSIS — Z992 Dependence on renal dialysis: Secondary | ICD-10-CM | POA: Diagnosis not present

## 2017-07-21 NOTE — Progress Notes (Signed)
Spoke with pt for pre-op call. Pt denies cardiac history, chest pain or sob. Pt states he is not diabetic. Pt had kidney cancer and no longer has kidneys, he does home peritoneal dialysis.  Pt states he does not have anyone that will be staying with him the 24 hours after surgery, states he never has and he always does fine.

## 2017-07-22 DIAGNOSIS — Z992 Dependence on renal dialysis: Secondary | ICD-10-CM | POA: Diagnosis not present

## 2017-07-22 DIAGNOSIS — D509 Iron deficiency anemia, unspecified: Secondary | ICD-10-CM | POA: Diagnosis not present

## 2017-07-22 DIAGNOSIS — N2581 Secondary hyperparathyroidism of renal origin: Secondary | ICD-10-CM | POA: Diagnosis not present

## 2017-07-22 DIAGNOSIS — D631 Anemia in chronic kidney disease: Secondary | ICD-10-CM | POA: Diagnosis not present

## 2017-07-22 DIAGNOSIS — Z112 Encounter for screening for other bacterial diseases: Secondary | ICD-10-CM | POA: Diagnosis not present

## 2017-07-22 DIAGNOSIS — N186 End stage renal disease: Secondary | ICD-10-CM | POA: Diagnosis not present

## 2017-07-22 IMAGING — NM NM MYOCAR MULTI W/SPECT W/WALL MOTION & EF
2 series · 12 of 12 positions shown · non-contrast
Comparison: none

[Series 1: rest · 8.28mm/px · 6 of 64 frames shown]
[frame 6/64]
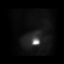
[frame 16/64]
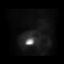
[frame 27/64]
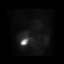
[frame 38/64]
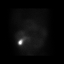
[frame 48/64]
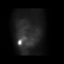
[frame 59/64]
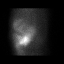

[Series 2: stress gated · 8.28mm/px · 6 of 64 frames shown]
[frame 6/64]
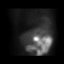
[frame 16/64]
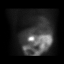
[frame 27/64]
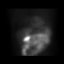
[frame 38/64]
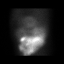
[frame 48/64]
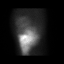
[frame 59/64]
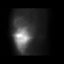

[12 of 12 positions shown; findings below may reference images not displayed]

Canned report from images found in remote index.

Refer to host system for actual result text.

## 2017-07-23 DIAGNOSIS — D631 Anemia in chronic kidney disease: Secondary | ICD-10-CM | POA: Diagnosis not present

## 2017-07-23 DIAGNOSIS — N186 End stage renal disease: Secondary | ICD-10-CM | POA: Diagnosis not present

## 2017-07-23 DIAGNOSIS — D509 Iron deficiency anemia, unspecified: Secondary | ICD-10-CM | POA: Diagnosis not present

## 2017-07-23 DIAGNOSIS — Z992 Dependence on renal dialysis: Secondary | ICD-10-CM | POA: Diagnosis not present

## 2017-07-23 DIAGNOSIS — N2581 Secondary hyperparathyroidism of renal origin: Secondary | ICD-10-CM | POA: Diagnosis not present

## 2017-07-23 DIAGNOSIS — Z112 Encounter for screening for other bacterial diseases: Secondary | ICD-10-CM | POA: Diagnosis not present

## 2017-07-24 DIAGNOSIS — Z992 Dependence on renal dialysis: Secondary | ICD-10-CM | POA: Diagnosis not present

## 2017-07-24 DIAGNOSIS — D509 Iron deficiency anemia, unspecified: Secondary | ICD-10-CM | POA: Diagnosis not present

## 2017-07-24 DIAGNOSIS — D631 Anemia in chronic kidney disease: Secondary | ICD-10-CM | POA: Diagnosis not present

## 2017-07-24 DIAGNOSIS — N186 End stage renal disease: Secondary | ICD-10-CM | POA: Diagnosis not present

## 2017-07-24 DIAGNOSIS — N2581 Secondary hyperparathyroidism of renal origin: Secondary | ICD-10-CM | POA: Diagnosis not present

## 2017-07-24 DIAGNOSIS — Z112 Encounter for screening for other bacterial diseases: Secondary | ICD-10-CM | POA: Diagnosis not present

## 2017-07-25 ENCOUNTER — Ambulatory Visit (HOSPITAL_COMMUNITY)
Admission: RE | Admit: 2017-07-25 | Discharge: 2017-07-25 | Disposition: A | Discharge status: Home / Self Care | Payer: Medicare Other | Source: Ambulatory Visit | Attending: Vascular Surgery | Admitting: Vascular Surgery

## 2017-07-25 ENCOUNTER — Encounter (HOSPITAL_COMMUNITY): Payer: Self-pay | Admitting: *Deleted

## 2017-07-25 ENCOUNTER — Ambulatory Visit (HOSPITAL_COMMUNITY): Payer: Medicare Other

## 2017-07-25 ENCOUNTER — Encounter (HOSPITAL_COMMUNITY)
Admission: RE | Disposition: A | Discharge status: Home / Self Care | Payer: Self-pay | Source: Ambulatory Visit | Attending: Vascular Surgery

## 2017-07-25 DIAGNOSIS — F329 Major depressive disorder, single episode, unspecified: Secondary | ICD-10-CM | POA: Diagnosis not present

## 2017-07-25 DIAGNOSIS — I120 Hypertensive chronic kidney disease with stage 5 chronic kidney disease or end stage renal disease: Secondary | ICD-10-CM | POA: Insufficient documentation

## 2017-07-25 DIAGNOSIS — D631 Anemia in chronic kidney disease: Secondary | ICD-10-CM | POA: Diagnosis not present

## 2017-07-25 DIAGNOSIS — D509 Iron deficiency anemia, unspecified: Secondary | ICD-10-CM | POA: Diagnosis not present

## 2017-07-25 DIAGNOSIS — Z7951 Long term (current) use of inhaled steroids: Secondary | ICD-10-CM | POA: Diagnosis not present

## 2017-07-25 DIAGNOSIS — K219 Gastro-esophageal reflux disease without esophagitis: Secondary | ICD-10-CM | POA: Insufficient documentation

## 2017-07-25 DIAGNOSIS — N186 End stage renal disease: Secondary | ICD-10-CM | POA: Insufficient documentation

## 2017-07-25 DIAGNOSIS — F419 Anxiety disorder, unspecified: Secondary | ICD-10-CM | POA: Insufficient documentation

## 2017-07-25 DIAGNOSIS — N2581 Secondary hyperparathyroidism of renal origin: Secondary | ICD-10-CM | POA: Diagnosis not present

## 2017-07-25 DIAGNOSIS — Z992 Dependence on renal dialysis: Secondary | ICD-10-CM | POA: Diagnosis not present

## 2017-07-25 DIAGNOSIS — N185 Chronic kidney disease, stage 5: Secondary | ICD-10-CM | POA: Diagnosis not present

## 2017-07-25 DIAGNOSIS — Z112 Encounter for screening for other bacterial diseases: Secondary | ICD-10-CM | POA: Diagnosis not present

## 2017-07-25 HISTORY — DX: Patient's other noncompliance with medication regimen for other reason: Z91.148

## 2017-07-25 HISTORY — DX: Heart failure, unspecified: I50.9

## 2017-07-25 HISTORY — PX: AV FISTULA PLACEMENT: SHX1204

## 2017-07-25 HISTORY — DX: Patient's other noncompliance with medication regimen: Z91.14

## 2017-07-25 LAB — POCT I-STAT 4, (NA,K, GLUC, HGB,HCT)
Glucose, Bld: 96 mg/dL (ref 65–99)
HCT: 35 % — ABNORMAL LOW (ref 39.0–52.0)
Hemoglobin: 11.9 g/dL — ABNORMAL LOW (ref 13.0–17.0)
POTASSIUM: 4.9 mmol/L (ref 3.5–5.1)
SODIUM: 132 mmol/L — AB (ref 135–145)

## 2017-07-25 SURGERY — ARTERIOVENOUS (AV) FISTULA CREATION
Anesthesia: Monitor Anesthesia Care | Laterality: Left

## 2017-07-25 MED ORDER — HYDROMORPHONE HCL 2 MG/ML IJ SOLN: 0.2500 mg | INTRAMUSCULAR | Status: DC | PRN

## 2017-07-25 MED ORDER — MIDAZOLAM HCL 2 MG/2ML IJ SOLN
INTRAMUSCULAR | Status: AC
  Filled 2017-07-25: qty 2

## 2017-07-25 MED ORDER — SODIUM CHLORIDE 0.9 % IV SOLN
INTRAVENOUS | Status: DC
  Administered 2017-07-25: 10:00:00 via INTRAVENOUS

## 2017-07-25 MED ORDER — LIDOCAINE 2% (20 MG/ML) 5 ML SYRINGE
INTRAMUSCULAR | Status: AC
  Filled 2017-07-25: qty 5

## 2017-07-25 MED ORDER — DEXAMETHASONE SODIUM PHOSPHATE 10 MG/ML IJ SOLN
INTRAMUSCULAR | Status: AC
  Filled 2017-07-25: qty 1

## 2017-07-25 MED ORDER — LIDOCAINE HCL (PF) 1 % IJ SOLN
INTRAMUSCULAR | Status: AC
  Filled 2017-07-25: qty 30

## 2017-07-25 MED ORDER — CHLORHEXIDINE GLUCONATE 4 % EX LIQD: 60.0000 mL | Freq: Once | CUTANEOUS | Status: DC

## 2017-07-25 MED ORDER — SODIUM CHLORIDE 0.9 % IV SOLN
INTRAVENOUS | Status: DC | PRN
  Administered 2017-07-25: 500 mL

## 2017-07-25 MED ORDER — FENTANYL CITRATE (PF) 250 MCG/5ML IJ SOLN
INTRAMUSCULAR | Status: AC
  Filled 2017-07-25: qty 5

## 2017-07-25 MED ORDER — FENTANYL CITRATE (PF) 250 MCG/5ML IJ SOLN
INTRAMUSCULAR | Status: DC | PRN
  Administered 2017-07-25: 50 ug via INTRAVENOUS

## 2017-07-25 MED ORDER — LIDOCAINE HCL (CARDIAC) PF 100 MG/5ML IV SOSY
PREFILLED_SYRINGE | INTRAVENOUS | Status: DC | PRN
  Administered 2017-07-25: 40 mg via INTRAVENOUS

## 2017-07-25 MED ORDER — ONDANSETRON HCL 4 MG/2ML IJ SOLN
INTRAMUSCULAR | Status: AC
  Filled 2017-07-25: qty 2

## 2017-07-25 MED ORDER — PROPOFOL 10 MG/ML IV BOLUS
INTRAVENOUS | Status: AC
  Filled 2017-07-25: qty 20

## 2017-07-25 MED ORDER — CEFAZOLIN SODIUM-DEXTROSE 2-4 GM/100ML-% IV SOLN
2.0000 g | INTRAVENOUS | Status: AC
  Administered 2017-07-25: 2 g via INTRAVENOUS
  Filled 2017-07-25: qty 100

## 2017-07-25 MED ORDER — PHENYLEPHRINE 40 MCG/ML (10ML) SYRINGE FOR IV PUSH (FOR BLOOD PRESSURE SUPPORT)
PREFILLED_SYRINGE | INTRAVENOUS | Status: AC
  Filled 2017-07-25: qty 10

## 2017-07-25 MED ORDER — HYDROCODONE-ACETAMINOPHEN 5-325 MG PO TABS: 1.0000 | ORAL_TABLET | Freq: Four times a day (QID) | ORAL | 0 refills | Status: DC | PRN

## 2017-07-25 MED ORDER — LIDOCAINE HCL (PF) 1 % IJ SOLN
INTRAMUSCULAR | Status: DC | PRN
  Administered 2017-07-25: 30 mL

## 2017-07-25 MED ORDER — SODIUM CHLORIDE 0.9 % IV SOLN
INTRAVENOUS | Status: AC
  Filled 2017-07-25: qty 1.2

## 2017-07-25 MED ORDER — OXYCODONE HCL 5 MG PO TABS: 5.0000 mg | ORAL_TABLET | Freq: Once | ORAL | Status: DC | PRN

## 2017-07-25 MED ORDER — PROPOFOL 500 MG/50ML IV EMUL
INTRAVENOUS | Status: DC | PRN
  Administered 2017-07-25: 75 ug/kg/min via INTRAVENOUS

## 2017-07-25 MED ORDER — OXYCODONE HCL 5 MG/5ML PO SOLN: 5.0000 mg | Freq: Once | ORAL | Status: DC | PRN

## 2017-07-25 MED ORDER — DEXTROSE 5 % IV SOLN
INTRAVENOUS | Status: DC | PRN
  Administered 2017-07-25: 40 ug/min via INTRAVENOUS

## 2017-07-25 MED ORDER — 0.9 % SODIUM CHLORIDE (POUR BTL) OPTIME
TOPICAL | Status: DC | PRN
  Administered 2017-07-25: 1000 mL

## 2017-07-25 MED ORDER — PROMETHAZINE HCL 25 MG/ML IJ SOLN: 6.2500 mg | INTRAMUSCULAR | Status: DC | PRN

## 2017-07-25 MED ORDER — DEXAMETHASONE SODIUM PHOSPHATE 10 MG/ML IJ SOLN
INTRAMUSCULAR | Status: DC | PRN
  Administered 2017-07-25: 4 mg via INTRAVENOUS

## 2017-07-25 MED ORDER — MIDAZOLAM HCL 5 MG/5ML IJ SOLN
INTRAMUSCULAR | Status: DC | PRN
  Administered 2017-07-25 (×2): 1 mg via INTRAVENOUS

## 2017-07-25 MED ORDER — ONDANSETRON HCL 4 MG/2ML IJ SOLN
INTRAMUSCULAR | Status: DC | PRN
  Administered 2017-07-25: 4 mg via INTRAVENOUS

## 2017-07-25 SURGICAL SUPPLY — 38 items
ADH SKN CLS APL DERMABOND .7 (GAUZE/BANDAGES/DRESSINGS) ×1
ARMBAND PINK RESTRICT EXTREMIT (MISCELLANEOUS) ×2 IMPLANT
CANISTER SUCT 3000ML PPV (MISCELLANEOUS) ×2 IMPLANT
CLIP VESOCCLUDE MED 6/CT (CLIP) ×2 IMPLANT
CLIP VESOCCLUDE SM WIDE 6/CT (CLIP) ×2 IMPLANT
COVER PROBE W GEL 5X96 (DRAPES) ×2 IMPLANT
DECANTER SPIKE VIAL GLASS SM (MISCELLANEOUS) ×2 IMPLANT
DERMABOND ADVANCED (GAUZE/BANDAGES/DRESSINGS) ×1
DERMABOND ADVANCED .7 DNX12 (GAUZE/BANDAGES/DRESSINGS) ×1 IMPLANT
ELECT REM PT RETURN 9FT ADLT (ELECTROSURGICAL) ×2
ELECTRODE REM PT RTRN 9FT ADLT (ELECTROSURGICAL) ×1 IMPLANT
GLOVE BIO SURGEON STRL SZ7 (GLOVE) ×2 IMPLANT
GLOVE BIOGEL PI IND STRL 6.5 (GLOVE) ×2 IMPLANT
GLOVE BIOGEL PI IND STRL 7.0 (GLOVE) ×1 IMPLANT
GLOVE BIOGEL PI IND STRL 7.5 (GLOVE) ×1 IMPLANT
GLOVE BIOGEL PI INDICATOR 6.5 (GLOVE) ×2
GLOVE BIOGEL PI INDICATOR 7.0 (GLOVE) ×1
GLOVE BIOGEL PI INDICATOR 7.5 (GLOVE) ×1
GLOVE SURG SS PI 6.5 STRL IVOR (GLOVE) ×4 IMPLANT
GOWN STRL NON-REIN LRG LVL3 (GOWN DISPOSABLE) ×2 IMPLANT
GOWN STRL REUS W/ TWL LRG LVL3 (GOWN DISPOSABLE) ×2 IMPLANT
GOWN STRL REUS W/ TWL XL LVL3 (GOWN DISPOSABLE) ×1 IMPLANT
GOWN STRL REUS W/TWL LRG LVL3 (GOWN DISPOSABLE) ×2
GOWN STRL REUS W/TWL XL LVL3 (GOWN DISPOSABLE) ×1
HEMOSTAT SPONGE AVITENE ULTRA (HEMOSTASIS) IMPLANT
KIT BASIN OR (CUSTOM PROCEDURE TRAY) ×2 IMPLANT
KIT TURNOVER KIT B (KITS) ×2 IMPLANT
NS IRRIG 1000ML POUR BTL (IV SOLUTION) ×2 IMPLANT
PACK CV ACCESS (CUSTOM PROCEDURE TRAY) ×2 IMPLANT
PAD ARMBOARD 7.5X6 YLW CONV (MISCELLANEOUS) ×4 IMPLANT
SUT MNCRL AB 4-0 PS2 18 (SUTURE) ×4 IMPLANT
SUT PROLENE 6 0 BV (SUTURE) ×2 IMPLANT
SUT PROLENE 7 0 BV 1 (SUTURE) ×4 IMPLANT
SUT VIC AB 3-0 SH 27 (SUTURE) ×4
SUT VIC AB 3-0 SH 27X BRD (SUTURE) ×2 IMPLANT
TOWEL GREEN STERILE (TOWEL DISPOSABLE) ×2 IMPLANT
UNDERPAD 30X30 (UNDERPADS AND DIAPERS) ×2 IMPLANT
WATER STERILE IRR 1000ML POUR (IV SOLUTION) ×2 IMPLANT

## 2017-07-25 NOTE — Progress Notes (Signed)
Patient ready for discharge. Sitting in recliner eating [employer] tray. Waiting on son for pick up after 3:30. Will proceed with discharge at that time.

## 2017-07-25 NOTE — Transfer of Care (Signed)
Immediate Anesthesia Transfer of Care Note  Patient: Jesus [name]  Procedure(s) Performed: RADIOCEPHALIC  ARTERIOVENOUS FISTULA LEFT ARM (Left )  Patient Location: PACU  Anesthesia Type:MAC  Level of Consciousness: awake, alert  and oriented  Airway & Oxygen Therapy: Patient Spontanous Breathing and Patient connected to face mask oxygen  Post-op Assessment: Report given to RN, Post -op Vital signs reviewed and stable and Patient moving all extremities  Post vital signs: Reviewed and stable  Last Vitals:  Vitals Value Taken Time  BP 146/108 07/25/2017  1:18 PM  Temp    Pulse 57 07/25/2017  1:20 PM  Resp 14 07/25/2017  1:20 PM  SpO2 100 % 07/25/2017  1:20 PM  Vitals shown include unvalidated device data.  Last Pain:  Vitals:   07/25/17 1021  TempSrc:   PainSc: 0-No pain      Patients Stated Pain Goal: 5 (31/49/70 2637)  Complications: No apparent anesthesia complications

## 2017-07-25 NOTE — Interval H&P Note (Signed)
History and Physical Interval Note:  07/25/2017 11:12 AM  Marin Comment  has presented today for surgery, with the diagnosis of END STAGE RENAL DISEASE FOR HEMODIALYSIS ACCESS  The various methods of treatment have been discussed with the patient and family. After consideration of risks, benefits and other options for treatment, the patient has consented to  Procedure(s): RADIOCEPHALIC VERSUS BRACHIOCEPHALIC ARTERIOVENOUS FISTULA LEFT ARM (Left) as a surgical intervention .  The patient's history has been reviewed, patient examined, no change in status, stable for surgery.  I have reviewed the patient's chart and labs.  Questions were answered to the patient's satisfaction.     Adele Barthel

## 2017-07-25 NOTE — Discharge Instructions (Signed)
° °  [facility] [place] ° °Discharge Instructions ° °AV Fistula or Graft Surgery for Dialysis Access ° °Please refer to the following instructions for your post-procedure care. Your surgeon or physician assistant will discuss any changes with you. ° °Activity ° °You may drive the day following your surgery, if you are comfortable and no longer taking prescription pain medication. Resume full activity as the soreness in your incision resolves. ° °Bathing/Showering ° °You may shower after you go home. Keep your incision dry for 48 hours. Do not soak in a bathtub, hot tub, or swim until the incision heals completely. You may not shower if you have a hemodialysis catheter. ° °Incision Care ° °Clean your incision with mild soap and water after 48 hours. Pat the area dry with a clean towel. You do not need a bandage unless otherwise instructed. Do not apply any ointments or creams to your incision. You may have skin glue on your incision. Do not peel it off. It will come off on its own in about one week. Your arm may swell a bit after surgery. To reduce swelling use pillows to elevate your arm so it is above your heart. Your doctor will tell you if you need to lightly wrap your arm with an ACE bandage. ° °Diet ° °Resume your normal diet. There are not special food restrictions following this procedure. In order to heal from your surgery, it is CRITICAL to get adequate nutrition. Your body requires vitamins, minerals, and protein. Vegetables are the best source of vitamins and minerals. Vegetables also provide the perfect balance of protein. Processed food has little nutritional value, so try to avoid this. ° °Medications ° °Resume taking all of your medications. If your incision is causing pain, you may take over-the counter pain relievers such as acetaminophen (Tylenol). If you were prescribed a stronger pain medication, please be aware these medications can cause nausea and constipation. Prevent  nausea by taking the medication with a snack or meal. Avoid constipation by drinking plenty of fluids and eating foods with high amount of fiber, such as fruits, vegetables, and grains. Do not take Tylenol if you are taking prescription pain medications. ° ° ° ° °Follow up °Your surgeon may want to see you in the office following your access surgery. If so, this will be arranged at the time of your surgery. ° °Please call us immediately for any of the following conditions: ° °Increased pain, redness, drainage (pus) from your incision site °Fever of 101 degrees or higher °Severe or worsening pain at your incision site °Hand pain or numbness. ° °Reduce your risk of vascular disease: ° °Stop smoking. If you would like help, call [facility] at 1-800-QUIT-NOW (1-800-784-8669) or [place] at 336-586-4000 ° °Manage your cholesterol °Maintain a desired weight °Control your diabetes °Keep your blood pressure down ° °Dialysis ° °It will take several weeks to several months for your new dialysis access to be ready for use. Your surgeon will determine when it is OK to use it. Your nephrologist will continue to direct your dialysis. You can continue to use your Permcath until your new access is ready for use. ° °If you have any questions, please call the office at [phone]. ° °

## 2017-07-25 NOTE — Op Note (Signed)
OPERATIVE NOTE   PROCEDURE: left radiocephaic arteriovenous fistula placement  PRE-OPERATIVE DIAGNOSIS: end stage renal disease   POST-OPERATIVE DIAGNOSIS: same as above   SURGEON: Adele Barthel, MD  ASSISTANT(S): Dagoberto Ligas, PAC   ANESTHESIA: local and MAC  ESTIMATED BLOOD LOSS: 50 cc  FINDING(S): 1.  Cephalic vein: 3.0 mm, acceptable 2.  Radial artery: 2.5 mm, thin rim of calcification in the wall 3.  Venous outflow: faintly palpable thrill  4.  Radial flow: dopplerable radial signal  SPECIMEN(S):  none  INDICATIONS:   Jesus [name] is a 67 y.o. male who presents with end stage renal disease on peritoneal dialysis.  The patient's nephrology recommended arteriovenous fistula placement in event of infection of his peritoneal dialysis catheter.  The patient is scheduled for left radiocephalic arteriovenous fistula placement.  The patient is aware the risks include but are not limited to: bleeding, infection, steal syndrome, nerve damage, ischemic monomelic neuropathy, failure to mature, and need for additional procedures.  The patient is aware of the risks of the procedure and elects to proceed forward.  DESCRIPTION: After full informed written consent was obtained from the patient, the patient was brought back to the operating room and placed supine upon the operating table.  Prior to induction, the patient received IV antibiotics.   After obtaining adequate anesthesia, the patient was then prepped and draped in the standard fashion for a left arm access procedure.   I turned my attention first to identifying the patient's distal cephalic vein and radial artery.  Using SonoSite guidance, the location of these vessels were marked out on the skin.   At this point, I injected local anesthetic to obtain a field block of the wrist.  In total, I injected about 5 mL of 1% lidocaine without epinephrine.  I made a longitudinal incision at the level of the wrist and dissected through  the subcutaneous tissue and fascia to gain exposure of the radial artery.  This was noted to be 2.5 mm in diameter externally.  This was dissected out proximally and distally and mark with a vessel loop.  I then made an incision over the the cephalic vein dissected it out.  This was noted to be 3 mm in diameter externally.  The distal segment of the vein was ligated with a  2-0 silk, and the vein was transected.  The proximal segment was interrogated with serial dilators.  The vein accepted up to a 4 mm dilator without any difficulty.  I then instilled the heparinized saline into the vein and clamped it.  At this point, I reset my exposure of the radial artery and placed the artery under tension proximally and distally.  I made an arteriotomy with a #11 blade, and then I extended the arteriotomy with a Potts scissor.  I injected heparinized saline proximal and distal to this arteriotomy.  The vein was then sewn to the artery in an end-to-side configuration with a running stitch of 7-0 Prolene.  Prior to completing this anastomosis, I allowed the vein and artery to backbleed.  There was no evidence of clot from any vessels.  I completed the anastomosis in the usual fashion and then released all vessel loops and clamps.    There was a faintly palpable thrill in the venous outflow, and there was a dopplerable radial signal.  At this point, I irrigated out the surgical wound.  There was no further active bleeding.  The subcutaneous tissue was reapproximated with a running stitch of  3-0 Vicryl.  The skin was then reapproximated with a running subcuticular stitch of 4-0 Vicryl.  The skin was then cleaned, dried, and reinforced with Dermabond.  The patient tolerated this procedure well.    COMPLICATIONS: none  CONDITION: stable   Adele Barthel, MD, [facility] [place] Office: [phone] Pager: [phone]  07/25/2017, 1:00 PM

## 2017-07-25 NOTE — Anesthesia Postprocedure Evaluation (Signed)
Anesthesia Post Note  Patient: Jesus [name]  Procedure(s) Performed: RADIOCEPHALIC  ARTERIOVENOUS FISTULA LEFT ARM (Left )     Patient location during evaluation: PACU Anesthesia Type: General Level of consciousness: awake and alert Pain management: pain level controlled Vital Signs Assessment: post-procedure vital signs reviewed and stable Respiratory status: spontaneous breathing, nonlabored ventilation and respiratory function stable Cardiovascular status: stable and blood pressure returned to baseline Postop Assessment: no apparent nausea or vomiting Anesthetic complications: no    Last Vitals:  Vitals:   07/25/17 1400 07/25/17 1403  BP: 119/64 131/68  Pulse: (!) 58 (!) 57  Resp: 11 14  Temp: (!) 36.3 C (!) 36.3 C  SpO2: 100% 100%    Last Pain:  Vitals:   07/25/17 1403  TempSrc:   PainSc: 0-No pain                 Lynda Rainwater

## 2017-07-25 NOTE — Progress Notes (Signed)
Pt. States his grandson Marijo Conception will stay with him or he will stay at his mother's house after surgery.

## 2017-07-25 NOTE — Anesthesia Preprocedure Evaluation (Signed)
Anesthesia Evaluation  Patient identified by MRN, date of birth, ID band Patient awake    Reviewed: Allergy & Precautions, NPO status , Patient's Chart, lab work & pertinent test results  Airway Mallampati: II  TM Distance: >3 FB Neck ROM: Full    Dental  (+) Teeth Intact   Pulmonary    breath sounds clear to auscultation       Cardiovascular hypertension, Pt. on medications  Rhythm:Regular Rate:Normal     Neuro/Psych Anxiety Depression    GI/Hepatic GERD  ,  Endo/Other    Renal/GU Dialysis and ESRFRenal disease     Musculoskeletal   Abdominal   Peds  Hematology   Anesthesia Other Findings   Reproductive/Obstetrics                             Anesthesia Physical  Anesthesia Plan  ASA: III  Anesthesia Plan: MAC   Post-op Pain Management:    Induction: Intravenous  PONV Risk Score and Plan: 1 and Ondansetron  Airway Management Planned: Simple Face Mask  Additional Equipment:   Intra-op Plan:   Post-operative Plan:   Informed Consent: I have reviewed the patients History and Physical, chart, labs and discussed the procedure including the risks, benefits and alternatives for the proposed anesthesia with the patient or authorized representative who has indicated his/her understanding and acceptance.     Plan Discussed with: Anesthesiologist and CRNA  Anesthesia Plan Comments:         Anesthesia Quick Evaluation

## 2017-07-26 ENCOUNTER — Telehealth: Payer: Self-pay | Admitting: Vascular Surgery

## 2017-07-26 ENCOUNTER — Encounter (HOSPITAL_COMMUNITY): Payer: Self-pay | Admitting: Vascular Surgery

## 2017-07-26 DIAGNOSIS — Z112 Encounter for screening for other bacterial diseases: Secondary | ICD-10-CM | POA: Diagnosis not present

## 2017-07-26 DIAGNOSIS — D509 Iron deficiency anemia, unspecified: Secondary | ICD-10-CM | POA: Diagnosis not present

## 2017-07-26 DIAGNOSIS — N2581 Secondary hyperparathyroidism of renal origin: Secondary | ICD-10-CM | POA: Diagnosis not present

## 2017-07-26 DIAGNOSIS — I120 Hypertensive chronic kidney disease with stage 5 chronic kidney disease or end stage renal disease: Secondary | ICD-10-CM | POA: Diagnosis not present

## 2017-07-26 DIAGNOSIS — N186 End stage renal disease: Secondary | ICD-10-CM | POA: Diagnosis not present

## 2017-07-26 DIAGNOSIS — T82511D Breakdown (mechanical) of surgically created arteriovenous shunt, subsequent encounter: Secondary | ICD-10-CM | POA: Diagnosis not present

## 2017-07-26 DIAGNOSIS — D631 Anemia in chronic kidney disease: Secondary | ICD-10-CM | POA: Diagnosis not present

## 2017-07-26 DIAGNOSIS — Z992 Dependence on renal dialysis: Secondary | ICD-10-CM | POA: Diagnosis not present

## 2017-07-26 NOTE — Telephone Encounter (Signed)
sch appt [facility] to pt 09/06/17 2pm Dialysis access 3pm p/o PA s/p L RC AVF per stf msg

## 2017-07-27 ENCOUNTER — Other Ambulatory Visit: Payer: Self-pay

## 2017-07-27 DIAGNOSIS — Z992 Dependence on renal dialysis: Principal | ICD-10-CM

## 2017-07-27 DIAGNOSIS — D631 Anemia in chronic kidney disease: Secondary | ICD-10-CM | POA: Diagnosis not present

## 2017-07-27 DIAGNOSIS — D509 Iron deficiency anemia, unspecified: Secondary | ICD-10-CM | POA: Diagnosis not present

## 2017-07-27 DIAGNOSIS — N186 End stage renal disease: Secondary | ICD-10-CM

## 2017-07-27 DIAGNOSIS — Z48812 Encounter for surgical aftercare following surgery on the circulatory system: Secondary | ICD-10-CM

## 2017-07-27 DIAGNOSIS — N2581 Secondary hyperparathyroidism of renal origin: Secondary | ICD-10-CM | POA: Diagnosis not present

## 2017-07-27 DIAGNOSIS — Z112 Encounter for screening for other bacterial diseases: Secondary | ICD-10-CM | POA: Diagnosis not present

## 2017-07-28 ENCOUNTER — Ambulatory Visit
Admission: RE | Admit: 2017-07-28 | Discharge: 2017-07-28 | Disposition: A | Discharge status: Home / Self Care | Payer: Medicare Other | Source: Ambulatory Visit | Attending: Nurse Practitioner | Admitting: Nurse Practitioner

## 2017-07-28 ENCOUNTER — Other Ambulatory Visit: Payer: Self-pay | Admitting: Nurse Practitioner

## 2017-07-28 DIAGNOSIS — D509 Iron deficiency anemia, unspecified: Secondary | ICD-10-CM | POA: Diagnosis not present

## 2017-07-28 DIAGNOSIS — I120 Hypertensive chronic kidney disease with stage 5 chronic kidney disease or end stage renal disease: Secondary | ICD-10-CM | POA: Diagnosis not present

## 2017-07-28 DIAGNOSIS — M542 Cervicalgia: Secondary | ICD-10-CM | POA: Diagnosis not present

## 2017-07-28 DIAGNOSIS — M47812 Spondylosis without myelopathy or radiculopathy, cervical region: Secondary | ICD-10-CM

## 2017-07-28 DIAGNOSIS — Z992 Dependence on renal dialysis: Secondary | ICD-10-CM | POA: Diagnosis not present

## 2017-07-28 DIAGNOSIS — Z112 Encounter for screening for other bacterial diseases: Secondary | ICD-10-CM | POA: Diagnosis not present

## 2017-07-28 DIAGNOSIS — T82511D Breakdown (mechanical) of surgically created arteriovenous shunt, subsequent encounter: Secondary | ICD-10-CM | POA: Diagnosis not present

## 2017-07-28 DIAGNOSIS — D631 Anemia in chronic kidney disease: Secondary | ICD-10-CM | POA: Diagnosis not present

## 2017-07-28 DIAGNOSIS — N2581 Secondary hyperparathyroidism of renal origin: Secondary | ICD-10-CM | POA: Diagnosis not present

## 2017-07-28 DIAGNOSIS — N186 End stage renal disease: Secondary | ICD-10-CM | POA: Diagnosis not present

## 2017-07-28 MED ORDER — DEXAMETHASONE SODIUM PHOSPHATE 4 MG/ML IJ SOLN
8.0000 mg | Freq: Once | INTRAMUSCULAR | Status: AC
  Administered 2017-07-28: 8 mg

## 2017-07-28 NOTE — Discharge Instructions (Signed)

## 2017-07-29 DIAGNOSIS — N2581 Secondary hyperparathyroidism of renal origin: Secondary | ICD-10-CM | POA: Diagnosis not present

## 2017-07-29 DIAGNOSIS — Z992 Dependence on renal dialysis: Secondary | ICD-10-CM | POA: Diagnosis not present

## 2017-07-29 DIAGNOSIS — D509 Iron deficiency anemia, unspecified: Secondary | ICD-10-CM | POA: Diagnosis not present

## 2017-07-29 DIAGNOSIS — N186 End stage renal disease: Secondary | ICD-10-CM | POA: Diagnosis not present

## 2017-07-30 DIAGNOSIS — N186 End stage renal disease: Secondary | ICD-10-CM | POA: Diagnosis not present

## 2017-07-30 DIAGNOSIS — N2581 Secondary hyperparathyroidism of renal origin: Secondary | ICD-10-CM | POA: Diagnosis not present

## 2017-07-30 DIAGNOSIS — D509 Iron deficiency anemia, unspecified: Secondary | ICD-10-CM | POA: Diagnosis not present

## 2017-07-30 DIAGNOSIS — Z992 Dependence on renal dialysis: Secondary | ICD-10-CM | POA: Diagnosis not present

## 2017-07-31 DIAGNOSIS — D509 Iron deficiency anemia, unspecified: Secondary | ICD-10-CM | POA: Diagnosis not present

## 2017-07-31 DIAGNOSIS — N186 End stage renal disease: Secondary | ICD-10-CM | POA: Diagnosis not present

## 2017-07-31 DIAGNOSIS — Z992 Dependence on renal dialysis: Secondary | ICD-10-CM | POA: Diagnosis not present

## 2017-07-31 DIAGNOSIS — N2581 Secondary hyperparathyroidism of renal origin: Secondary | ICD-10-CM | POA: Diagnosis not present

## 2017-08-01 DIAGNOSIS — N2581 Secondary hyperparathyroidism of renal origin: Secondary | ICD-10-CM | POA: Diagnosis not present

## 2017-08-01 DIAGNOSIS — D509 Iron deficiency anemia, unspecified: Secondary | ICD-10-CM | POA: Diagnosis not present

## 2017-08-01 DIAGNOSIS — N186 End stage renal disease: Secondary | ICD-10-CM | POA: Diagnosis not present

## 2017-08-01 DIAGNOSIS — I120 Hypertensive chronic kidney disease with stage 5 chronic kidney disease or end stage renal disease: Secondary | ICD-10-CM | POA: Diagnosis not present

## 2017-08-01 DIAGNOSIS — T82511D Breakdown (mechanical) of surgically created arteriovenous shunt, subsequent encounter: Secondary | ICD-10-CM | POA: Diagnosis not present

## 2017-08-01 DIAGNOSIS — Z992 Dependence on renal dialysis: Secondary | ICD-10-CM | POA: Diagnosis not present

## 2017-08-02 DIAGNOSIS — D509 Iron deficiency anemia, unspecified: Secondary | ICD-10-CM | POA: Diagnosis not present

## 2017-08-02 DIAGNOSIS — N186 End stage renal disease: Secondary | ICD-10-CM | POA: Diagnosis not present

## 2017-08-02 DIAGNOSIS — Z992 Dependence on renal dialysis: Secondary | ICD-10-CM | POA: Diagnosis not present

## 2017-08-02 DIAGNOSIS — N2581 Secondary hyperparathyroidism of renal origin: Secondary | ICD-10-CM | POA: Diagnosis not present

## 2017-08-03 ENCOUNTER — Encounter: Payer: Self-pay | Admitting: Family Medicine

## 2017-08-03 DIAGNOSIS — D509 Iron deficiency anemia, unspecified: Secondary | ICD-10-CM | POA: Diagnosis not present

## 2017-08-03 DIAGNOSIS — I120 Hypertensive chronic kidney disease with stage 5 chronic kidney disease or end stage renal disease: Secondary | ICD-10-CM | POA: Diagnosis not present

## 2017-08-03 DIAGNOSIS — N2581 Secondary hyperparathyroidism of renal origin: Secondary | ICD-10-CM | POA: Diagnosis not present

## 2017-08-03 DIAGNOSIS — T82511D Breakdown (mechanical) of surgically created arteriovenous shunt, subsequent encounter: Secondary | ICD-10-CM | POA: Diagnosis not present

## 2017-08-03 DIAGNOSIS — Z992 Dependence on renal dialysis: Secondary | ICD-10-CM | POA: Diagnosis not present

## 2017-08-03 DIAGNOSIS — N186 End stage renal disease: Secondary | ICD-10-CM | POA: Diagnosis not present

## 2017-08-04 ENCOUNTER — Encounter: Payer: Self-pay | Admitting: Family Medicine

## 2017-08-04 DIAGNOSIS — N186 End stage renal disease: Secondary | ICD-10-CM | POA: Diagnosis not present

## 2017-08-04 DIAGNOSIS — N2581 Secondary hyperparathyroidism of renal origin: Secondary | ICD-10-CM | POA: Diagnosis not present

## 2017-08-04 DIAGNOSIS — D509 Iron deficiency anemia, unspecified: Secondary | ICD-10-CM | POA: Diagnosis not present

## 2017-08-04 DIAGNOSIS — Z992 Dependence on renal dialysis: Secondary | ICD-10-CM | POA: Diagnosis not present

## 2017-08-05 DIAGNOSIS — N186 End stage renal disease: Secondary | ICD-10-CM | POA: Diagnosis not present

## 2017-08-05 DIAGNOSIS — N2581 Secondary hyperparathyroidism of renal origin: Secondary | ICD-10-CM | POA: Diagnosis not present

## 2017-08-05 DIAGNOSIS — Z992 Dependence on renal dialysis: Secondary | ICD-10-CM | POA: Diagnosis not present

## 2017-08-05 DIAGNOSIS — D509 Iron deficiency anemia, unspecified: Secondary | ICD-10-CM | POA: Diagnosis not present

## 2017-08-06 DIAGNOSIS — D509 Iron deficiency anemia, unspecified: Secondary | ICD-10-CM | POA: Diagnosis not present

## 2017-08-06 DIAGNOSIS — Z992 Dependence on renal dialysis: Secondary | ICD-10-CM | POA: Diagnosis not present

## 2017-08-06 DIAGNOSIS — N186 End stage renal disease: Secondary | ICD-10-CM | POA: Diagnosis not present

## 2017-08-06 DIAGNOSIS — N2581 Secondary hyperparathyroidism of renal origin: Secondary | ICD-10-CM | POA: Diagnosis not present

## 2017-08-07 ENCOUNTER — Inpatient Hospital Stay (HOSPITAL_COMMUNITY): Payer: Medicare Other | Attending: Hematology

## 2017-08-07 DIAGNOSIS — R200 Anesthesia of skin: Secondary | ICD-10-CM | POA: Diagnosis not present

## 2017-08-07 DIAGNOSIS — D631 Anemia in chronic kidney disease: Secondary | ICD-10-CM | POA: Insufficient documentation

## 2017-08-07 DIAGNOSIS — I132 Hypertensive heart and chronic kidney disease with heart failure and with stage 5 chronic kidney disease, or end stage renal disease: Secondary | ICD-10-CM | POA: Insufficient documentation

## 2017-08-07 DIAGNOSIS — I120 Hypertensive chronic kidney disease with stage 5 chronic kidney disease or end stage renal disease: Secondary | ICD-10-CM | POA: Diagnosis not present

## 2017-08-07 DIAGNOSIS — Z905 Acquired absence of kidney: Secondary | ICD-10-CM | POA: Diagnosis not present

## 2017-08-07 DIAGNOSIS — R42 Dizziness and giddiness: Secondary | ICD-10-CM | POA: Insufficient documentation

## 2017-08-07 DIAGNOSIS — Z9221 Personal history of antineoplastic chemotherapy: Secondary | ICD-10-CM | POA: Diagnosis not present

## 2017-08-07 DIAGNOSIS — N2581 Secondary hyperparathyroidism of renal origin: Secondary | ICD-10-CM | POA: Diagnosis not present

## 2017-08-07 DIAGNOSIS — Z79899 Other long term (current) drug therapy: Secondary | ICD-10-CM | POA: Insufficient documentation

## 2017-08-07 DIAGNOSIS — Z992 Dependence on renal dialysis: Secondary | ICD-10-CM | POA: Insufficient documentation

## 2017-08-07 DIAGNOSIS — C641 Malignant neoplasm of right kidney, except renal pelvis: Secondary | ICD-10-CM | POA: Insufficient documentation

## 2017-08-07 DIAGNOSIS — T82511D Breakdown (mechanical) of surgically created arteriovenous shunt, subsequent encounter: Secondary | ICD-10-CM | POA: Diagnosis not present

## 2017-08-07 DIAGNOSIS — N186 End stage renal disease: Secondary | ICD-10-CM | POA: Diagnosis not present

## 2017-08-07 DIAGNOSIS — I509 Heart failure, unspecified: Secondary | ICD-10-CM | POA: Insufficient documentation

## 2017-08-07 DIAGNOSIS — R5383 Other fatigue: Secondary | ICD-10-CM | POA: Diagnosis not present

## 2017-08-07 DIAGNOSIS — D509 Iron deficiency anemia, unspecified: Secondary | ICD-10-CM | POA: Diagnosis not present

## 2017-08-07 DIAGNOSIS — Z8042 Family history of malignant neoplasm of prostate: Secondary | ICD-10-CM | POA: Insufficient documentation

## 2017-08-07 DIAGNOSIS — Z800 Family history of malignant neoplasm of digestive organs: Secondary | ICD-10-CM | POA: Diagnosis not present

## 2017-08-07 DIAGNOSIS — C649 Malignant neoplasm of unspecified kidney, except renal pelvis: Secondary | ICD-10-CM

## 2017-08-07 LAB — COMPREHENSIVE METABOLIC PANEL
ALT: 11 U/L — ABNORMAL LOW (ref 17–63)
ANION GAP: 18 — AB (ref 5–15)
AST: 16 U/L (ref 15–41)
Albumin: 3.5 g/dL (ref 3.5–5.0)
Alkaline Phosphatase: 100 U/L (ref 38–126)
BUN: 84 mg/dL — ABNORMAL HIGH (ref 6–20)
CHLORIDE: 88 mmol/L — AB (ref 101–111)
CO2: 26 mmol/L (ref 22–32)
Calcium: 9.7 mg/dL (ref 8.9–10.3)
Creatinine, Ser: 17.42 mg/dL — ABNORMAL HIGH (ref 0.61–1.24)
GFR, EST AFRICAN AMERICAN: 3 mL/min — AB (ref >60)
GFR, EST NON AFRICAN AMERICAN: 2 mL/min — AB (ref >60)
Glucose, Bld: 103 mg/dL — ABNORMAL HIGH (ref 65–99)
POTASSIUM: 5.5 mmol/L — AB (ref 3.5–5.1)
Sodium: 132 mmol/L — ABNORMAL LOW (ref 135–145)
Total Bilirubin: 1 mg/dL (ref 0.3–1.2)
Total Protein: 6.9 g/dL (ref 6.5–8.1)

## 2017-08-07 LAB — CBC WITH DIFFERENTIAL/PLATELET
BASOS ABS: 0 10*3/uL (ref 0.0–0.1)
Basophils Relative: 0 %
Eosinophils Absolute: 0.4 10*3/uL (ref 0.0–0.7)
Eosinophils Relative: 3 %
HCT: 34.9 % — ABNORMAL LOW (ref 39.0–52.0)
Hemoglobin: 11.5 g/dL — ABNORMAL LOW (ref 13.0–17.0)
LYMPHS ABS: 1.5 10*3/uL (ref 0.7–4.0)
Lymphocytes Relative: 12 %
MCH: 32.4 pg (ref 26.0–34.0)
MCHC: 33 g/dL (ref 30.0–36.0)
MCV: 98.3 fL (ref 78.0–100.0)
MONO ABS: 1 10*3/uL (ref 0.1–1.0)
Monocytes Relative: 8 %
Neutro Abs: 9.8 10*3/uL — ABNORMAL HIGH (ref 1.7–7.7)
Neutrophils Relative %: 77 %
PLATELETS: 261 10*3/uL (ref 150–400)
RBC: 3.55 MIL/uL — AB (ref 4.22–5.81)
RDW: 14.1 % (ref 11.5–15.5)
WBC: 12.7 10*3/uL — AB (ref 4.0–10.5)

## 2017-08-08 ENCOUNTER — Ambulatory Visit (HOSPITAL_COMMUNITY)
Admission: RE | Admit: 2017-08-08 | Discharge: 2017-08-08 | Disposition: A | Discharge status: Home / Self Care | Payer: Medicare Other | Source: Ambulatory Visit | Attending: Oncology | Admitting: Oncology

## 2017-08-08 DIAGNOSIS — N186 End stage renal disease: Secondary | ICD-10-CM | POA: Diagnosis not present

## 2017-08-08 DIAGNOSIS — J90 Pleural effusion, not elsewhere classified: Secondary | ICD-10-CM | POA: Insufficient documentation

## 2017-08-08 DIAGNOSIS — C649 Malignant neoplasm of unspecified kidney, except renal pelvis: Secondary | ICD-10-CM | POA: Diagnosis not present

## 2017-08-08 DIAGNOSIS — Z905 Acquired absence of kidney: Secondary | ICD-10-CM | POA: Insufficient documentation

## 2017-08-08 DIAGNOSIS — Z992 Dependence on renal dialysis: Secondary | ICD-10-CM | POA: Insufficient documentation

## 2017-08-08 DIAGNOSIS — Z8559 Personal history of malignant neoplasm of other urinary tract organ: Secondary | ICD-10-CM | POA: Diagnosis not present

## 2017-08-08 DIAGNOSIS — J984 Other disorders of lung: Secondary | ICD-10-CM | POA: Diagnosis not present

## 2017-08-08 DIAGNOSIS — D509 Iron deficiency anemia, unspecified: Secondary | ICD-10-CM | POA: Diagnosis not present

## 2017-08-08 DIAGNOSIS — I700 Atherosclerosis of aorta: Secondary | ICD-10-CM | POA: Diagnosis not present

## 2017-08-08 DIAGNOSIS — N2581 Secondary hyperparathyroidism of renal origin: Secondary | ICD-10-CM | POA: Diagnosis not present

## 2017-08-08 MED ORDER — IOPAMIDOL (ISOVUE-300) INJECTION 61%
100.0000 mL | Freq: Once | INTRAVENOUS | Status: AC | PRN
  Administered 2017-08-08: 100 mL via INTRAVENOUS

## 2017-08-09 DIAGNOSIS — N2581 Secondary hyperparathyroidism of renal origin: Secondary | ICD-10-CM | POA: Diagnosis not present

## 2017-08-09 DIAGNOSIS — N186 End stage renal disease: Secondary | ICD-10-CM | POA: Diagnosis not present

## 2017-08-09 DIAGNOSIS — D509 Iron deficiency anemia, unspecified: Secondary | ICD-10-CM | POA: Diagnosis not present

## 2017-08-09 DIAGNOSIS — Z992 Dependence on renal dialysis: Secondary | ICD-10-CM | POA: Diagnosis not present

## 2017-08-10 ENCOUNTER — Other Ambulatory Visit: Payer: Self-pay

## 2017-08-10 ENCOUNTER — Ambulatory Visit (HOSPITAL_COMMUNITY): Payer: Medicare Other

## 2017-08-10 ENCOUNTER — Encounter: Payer: Self-pay | Admitting: Family Medicine

## 2017-08-10 ENCOUNTER — Ambulatory Visit (INDEPENDENT_AMBULATORY_CARE_PROVIDER_SITE_OTHER): Payer: Medicare Other | Admitting: Family Medicine

## 2017-08-10 ENCOUNTER — Encounter (HOSPITAL_COMMUNITY): Payer: Self-pay | Admitting: Hematology

## 2017-08-10 ENCOUNTER — Inpatient Hospital Stay (HOSPITAL_BASED_OUTPATIENT_CLINIC_OR_DEPARTMENT_OTHER): Payer: Medicare Other | Admitting: Hematology

## 2017-08-10 VITALS — BP 129/67 | HR 64 | Temp 98.3°F | Resp 16 | Wt 242.3 lb

## 2017-08-10 VITALS — BP 142/74 | HR 77 | Temp 98.6°F | Resp 12 | Ht 70.0 in | Wt 241.0 lb

## 2017-08-10 DIAGNOSIS — C649 Malignant neoplasm of unspecified kidney, except renal pelvis: Secondary | ICD-10-CM

## 2017-08-10 DIAGNOSIS — Z905 Acquired absence of kidney: Secondary | ICD-10-CM | POA: Diagnosis not present

## 2017-08-10 DIAGNOSIS — C689 Malignant neoplasm of urinary organ, unspecified: Secondary | ICD-10-CM

## 2017-08-10 DIAGNOSIS — C641 Malignant neoplasm of right kidney, except renal pelvis: Secondary | ICD-10-CM | POA: Diagnosis not present

## 2017-08-10 DIAGNOSIS — F419 Anxiety disorder, unspecified: Secondary | ICD-10-CM

## 2017-08-10 DIAGNOSIS — N186 End stage renal disease: Secondary | ICD-10-CM

## 2017-08-10 DIAGNOSIS — I509 Heart failure, unspecified: Secondary | ICD-10-CM

## 2017-08-10 DIAGNOSIS — Z9221 Personal history of antineoplastic chemotherapy: Secondary | ICD-10-CM

## 2017-08-10 DIAGNOSIS — F329 Major depressive disorder, single episode, unspecified: Secondary | ICD-10-CM

## 2017-08-10 DIAGNOSIS — R42 Dizziness and giddiness: Secondary | ICD-10-CM

## 2017-08-10 DIAGNOSIS — R5383 Other fatigue: Secondary | ICD-10-CM

## 2017-08-10 DIAGNOSIS — I132 Hypertensive heart and chronic kidney disease with heart failure and with stage 5 chronic kidney disease, or end stage renal disease: Secondary | ICD-10-CM

## 2017-08-10 DIAGNOSIS — N2581 Secondary hyperparathyroidism of renal origin: Secondary | ICD-10-CM | POA: Diagnosis not present

## 2017-08-10 DIAGNOSIS — Z992 Dependence on renal dialysis: Secondary | ICD-10-CM

## 2017-08-10 DIAGNOSIS — D509 Iron deficiency anemia, unspecified: Secondary | ICD-10-CM | POA: Diagnosis not present

## 2017-08-10 DIAGNOSIS — D631 Anemia in chronic kidney disease: Secondary | ICD-10-CM

## 2017-08-10 DIAGNOSIS — I10 Essential (primary) hypertension: Secondary | ICD-10-CM | POA: Diagnosis not present

## 2017-08-10 DIAGNOSIS — R200 Anesthesia of skin: Secondary | ICD-10-CM | POA: Diagnosis not present

## 2017-08-10 DIAGNOSIS — Z79899 Other long term (current) drug therapy: Secondary | ICD-10-CM | POA: Diagnosis not present

## 2017-08-10 DIAGNOSIS — F32A Depression, unspecified: Secondary | ICD-10-CM

## 2017-08-10 DIAGNOSIS — Z800 Family history of malignant neoplasm of digestive organs: Secondary | ICD-10-CM

## 2017-08-10 DIAGNOSIS — Z8042 Family history of malignant neoplasm of prostate: Secondary | ICD-10-CM

## 2017-08-10 MED ORDER — AMLODIPINE BESYLATE 5 MG PO TABS: 5.0000 mg | ORAL_TABLET | Freq: Two times a day (BID) | ORAL | 1 refills | Status: DC

## 2017-08-10 MED ORDER — MECLIZINE HCL 25 MG PO TABS: 25.0000 mg | ORAL_TABLET | Freq: Three times a day (TID) | ORAL | 0 refills | Status: DC | PRN

## 2017-08-10 NOTE — Patient Instructions (Signed)
Take the norvasc/amlodipine 5 mg twice a day Check bp readings and note your readings and medication dosing and bring to your follow up.

## 2017-08-10 NOTE — Assessment & Plan Note (Signed)
ASSESSMENT/PLAN 1.  High-grade papillary urothelial carcinoma -right nephrectomy, ureterectomy, and retroperitoneal lymph node dissection with Dr. Brendia Sacks on 09/12/2012. Pathology revealed high-grade papillary urothelial carcinoma pT3N0.  Patient was never smoker.  Family history included colon cancer in paternal grandmother and prostate cancer in a maternal uncle. -Chemotherapy with every 3 week cisplatin 35 mg/m2 and gemcitabine 1000 mg/m2 on days 1 & 8 on 10/21/2013 and completed 4 cycles on 12/30/2013. -S/p ablation of 2 left ureter papillary tumors in April 2016.  -S/p ablation of multiple ureteral and collecting system tumors in July 2016 and again in October 2016, with residual disease remaining. -On basis of FGFR mutation, Pazopanib 800 mg daily started 01/18/2015, dose-reduced to 600mg  daily on 02/02/2015, stopped 03/10/2015 for LFT abnormalities and upcoming surgery. -04/06/2015 complete resection of the left kidney, ureter and bladder, pathology showing T3a urothelial carcinoma of the ureter and bladder, 0/9 lymph nodes - Recurrent right pleural effusion, cytology on 12/06/2016 negative for malignant cells. - Currently has no symptoms including new onset pains, weight loss.  I have discussed the results of the CT scan of the chest, abdomen and pelvis dated 08/08/2017 which did not show any evidence of recurrence. -He will come back in 6 months with repeat CT scan.

## 2017-08-10 NOTE — Progress Notes (Signed)
Patient ID: Jesus [name], male    DOB: January 12, 1951, 67 y.o.   MRN: [id number]  Chief Complaint  Patient presents with  . Hypertension  . Depression    Allergies Patient has no known allergies.  Subjective:   Jesus [name] is a 67 y.o. male who presents to [facility] today.  HPI [name] presents for follow-up today.  He reports that has been going through fistula placement in left arm as a back up for dialysis if ever has trouble/problem with HD. Using peritoneal dialysis at home.  Reports his blood pressure has not been well controlled.  Brings in readings of his blood pressure in the doses of the medication that he has been taking.  His blood pressures have been ranging from 110-170/ 60-90s.  He has not been taking the metoprolol for many months. Reports that he alters his dosing of the Norvasc depending on his blood pressure readings.  He has not been consistently taking the same dose each day.  He feels like if he takes the 10 mg dosing of Norvasc that his blood pressure drops too much.  But then he reports that if he does not take it it then sometimes will be too high.  He reports that if he takes the 10 mg several days in a row after not having taken the blood pressure medication that then his blood pressure is fine until about the fourth day when his pressures will drop lower than they should be.  He checks his blood pressure several times a day.  He has some days been taking 1/4 pill and some days taking half a pill.  He denies any chest pain or shortness of breath.  He reports that when his pressures get low he feels bad and tired.  Reports that he is still followed by neurosurgery for his back and neck.  He sees Dr. [name].  Reports that last week he had 2 episodes of vertigo.  He reports he has had this in the past.  He has been seen by ENT and neurology.  Told that he just has vertigo.  Reports that he had studies done on his ears, tilt table testing, scan of his brain.   Reports that he was told everything was normal.  Was given a prescription for meclizine to use as needed.  Reports that he went to get his medication out this past week and it was expired.  He would like to have a refill in case this occurs.  He reports that he does not drive if he feels this dizziness comes on.  He reports that it bothers him usually once or twice a year.  Has had this since early 2000.  He denies any current vertigo.  He denies any ear pain.  Denies any drainage from his ear.  Does not have any vision changes.  Reports that he does get some headaches but this is associated with his neck pain in the way he sleeps.  He reports that his mood is pretty good.  Does not feel as down or depressed as he used to be.  Take bp medication at 9 am, check bp in the evening at 8  Dizziness  This is a chronic problem. The current episode started more than 1 year ago. The problem occurs intermittently. The problem has been waxing and waning. Associated symptoms include neck pain. Pertinent negatives include no abdominal pain, anorexia, arthralgias, change in bowel habit, chest pain, chills, congestion, coughing,  fever, headaches, joint swelling, myalgias, nausea, numbness, rash, sore throat, swollen glands, visual change, vomiting or weakness. Nothing aggravates the symptoms. He has tried lying down and position changes for the symptoms. The treatment provided mild relief.    Past Medical History:  Diagnosis Date  . Anemia   . Anxiety   . Arthritis   . Cancer [facility])    bladder, ureter, Bil kidneys  . CHF (congestive heart failure) ([place])   . Current moderate episode of major depressive disorder without prior episode ([place]) 06/28/2017  . Dermatitis    scaly bump occurs every few months, pt uses cortizone cream and it goes away  . ESRD (end stage renal disease) on dialysis ([place])    Bilateral Nephrectomies- due to cancer  . GERD (gastroesophageal reflux disease) 04/07/2015  . History of blood  transfusion   . Hypertension   . Lumbar back pain   . Mental disorder   . Non compliance w medication regimen    written in error  . Pleural effusion, right    s/p right thoracentesis 10/07/15 (no malignancy by cytology report)  . Shortness of breath dyspnea    Lying down gets short of breath    Past Surgical History:  Procedure Laterality Date  . AV FISTULA PLACEMENT Right 06/05/2015   Procedure: ARTERIOVENOUS (AV) FISTULA CREATION RIGHT ARM;  Surgeon: Angelia Mould, MD;  Location: [place];  Service: Vascular;  Laterality: Right;  . AV FISTULA PLACEMENT Left 07/25/2017   Procedure: RADIOCEPHALIC  ARTERIOVENOUS FISTULA LEFT ARM;  Surgeon: Conrad [place], MD;  Location: [place];  Service: Vascular;  Laterality: Left;  . BLADDER SURGERY  04-06-2015   removal of bladder/ [facility]   . CAPD INSERTION N/A 06/24/2016   Procedure: LAPAROSCOPIC INSERTION CONTINUOUS AMBULATORY PERITONEAL DIALYSIS  (CAPD) CATHETER;  Surgeon: Clovis Riley, MD;  Location: [place];  Service: General;  Laterality: N/A;  . CARPAL TUNNEL RELEASE     left hand x 2; right hand x 1; right elbow  . CERVICAL FUSION     x 2  . EYE SURGERY Bilateral    Cataract removal  . FISTULA SUPERFICIALIZATION Right 10/30/2015   Procedure: SUPERFICIALIZATION BRACHIOCEPHALIC ARTERIOVENOUS FISTULA Right Arm;  Surgeon: Angelia Mould, MD;  Location: [place];  Service: Vascular;  Laterality: Right;  . Hemocatheter    . IR GENERIC HISTORICAL Right 04/29/2016   IR THROMBECTOMY AV FISTULA W/THROMBOLYSIS/PTA INC/SHUNT/IMG RIGHT 04/29/2016 Arne Cleveland, MD [facility]-INTERV RAD  . IR GENERIC HISTORICAL  04/29/2016   IR US GUIDE VASC ACCESS RIGHT 04/29/2016 Arne Cleveland, MD [facility]INTER[facility]  . IR THORACENTESIS ASP PLEURAL SPACE W/IMG GUIDE  12/06/2016  . KNEE ARTHROSCOPY Bilateral    bilateral  . KNEE ARTHROSCOPY WITH MEDIAL MENISECTOMY Left 02/26/2013   Procedure: KNEE ARTHROSCOPY WITH MEDIAL MENISECTOMY;  Surgeon: Garald Balding, MD;  Location: [place];  Service: Orthopedics;  Laterality: Left;  . LIGATION OF COMPETING BRANCHES OF ARTERIOVENOUS FISTULA Right 10/30/2015   Procedure: LIGATION OF COMPETING BRANCHES OF BRACHIOCEPHALIC ARTERIOVENOUS FISTULA;  Surgeon: Angelia Mould, MD;  Location: [place];  Service: Vascular;  Laterality: Right;  . NEPHRECTOMY Right September 12, 2012  . NEPHRECTOMY Left 04-06-2015   [place] Right 09/07/2015   Procedure: Fistulagram;  Surgeon: Angelia Mould, MD;  Location: [place] [facility];  Service: Cardiovascular;  Laterality: Right;  ARM  . PERIPHERAL VASCULAR CATHETERIZATION Right 09/07/2015   Procedure: Peripheral Vascular Balloon  Angioplasty;  Surgeon: Angelia Mould, MD;  Location: [place] [facility];  Service: Cardiovascular;  Laterality: Right;  upper arm venous  . PROSTATECTOMY  04-06-2015   [place] Bilateral    bilateral  . TOTAL KNEE ARTHROPLASTY  01/24/2012   Procedure: TOTAL KNEE ARTHROPLASTY;  Surgeon: Garald Balding, MD;  Location: [place];  Service: Orthopedics;  Laterality: Right;  RIGHT TOTAL KNEE REPLACEMENT    Family History  Problem Relation Age of Onset  . Cancer Mother   . Hypertension Father   . Atrial fibrillation Father   . Heart disease Father      Social History   Socioeconomic History  . Marital status: Widowed    Spouse name: Not on file  . Number of children: Not on file  . Years of education: Not on file  . Highest education level: Not on file  Occupational History  . Not on file  Social Needs  . Financial resource strain: Not on file  . Food insecurity:    Worry: Not on file    Inability: Not on file  . Transportation needs:    Medical: Not on file    Non-medical: Not on file  Tobacco Use  . Smoking status: Never Smoker  . Smokeless tobacco: Never Used  Substance and Sexual Activity  . Alcohol use: Not  Currently    Alcohol/week: 0.0 oz    Comment: heavy drinker in the past  . Drug use: Never  . Sexual activity: Not on file  Lifestyle  . Physical activity:    Days per week: Not on file    Minutes per session: Not on file  . Stress: Not on file  Relationships  . Social connections:    Talks on phone: Not on file    Gets together: Not on file    Attends religious service: Not on file    Active member of club or organization: Not on file    Attends meetings of clubs or organizations: Not on file    Relationship status: Not on file  Other Topics Concern  . Not on file  Social History Narrative   Lives alone. Manages well per patient report. Does not cook much. Eats out a lot. Eats all food groups. Does not eat much fruit. Married in the past. Has one son. Used to drive a truck.    Current Outpatient Medications on File Prior to Visit  Medication Sig Dispense Refill  . ALPRAZolam (XANAX) 1 MG tablet Take 1 mg by mouth daily as needed for anxiety or sleep.    . clobetasol cream (TEMOVATE) 2.54 % Apply 1 application topically 2 (two) times daily as needed (skin irritation).     . fluticasone (FLONASE) 50 MCG/ACT nasal spray Place 1 spray into both nostrils daily.     Marland Kitchen HYDROcodone-acetaminophen (NORCO) 5-325 MG tablet Take 1 tablet by mouth every 6 (six) hours as needed for moderate pain. 10 tablet 0  . ibuprofen (ADVIL,MOTRIN) 200 MG tablet Take 400 mg by mouth every 6 (six) hours as needed for moderate pain.    Marland Kitchen lanthanum (FOSRENOL) 500 MG chewable tablet Chew 500 mg by mouth 3 (three) times daily with meals.    [name] Glycol-Propyl Glycol (SYSTANE OP) Place 1 drop into both eyes daily as needed (for dry, itchy eyes).    . triamcinolone cream (KENALOG) 0.1 % Apply 1 application topically 2 (two) times daily. As directed. Do not apply to  face or skin folds  6   No current facility-administered medications on file prior to visit.     Review of Systems  Constitutional: Negative  for appetite change, chills, fever and unexpected weight change.  HENT: Negative for congestion, ear discharge, ear pain, hearing loss, nosebleeds, postnasal drip, rhinorrhea, sinus pressure, sinus pain, sneezing, sore throat, trouble swallowing and voice change.   Eyes: Negative for photophobia and visual disturbance.  Respiratory: Negative for cough, chest tightness, shortness of breath and wheezing.   Cardiovascular: Negative for chest pain, palpitations and leg swelling.  Gastrointestinal: Negative for abdominal pain, anorexia, blood in stool, change in bowel habit, diarrhea, nausea and vomiting.  Genitourinary: Negative for decreased urine volume, dysuria and frequency.  Musculoskeletal: Positive for back pain and neck pain. Negative for arthralgias, joint swelling and myalgias.  Skin: Negative for rash.  Neurological: Positive for dizziness. Negative for tremors, syncope, facial asymmetry, weakness, light-headedness, numbness and headaches.  Hematological: Negative for adenopathy. Does not bruise/bleed easily.  Psychiatric/Behavioral: Negative for behavioral problems, dysphoric mood and sleep disturbance. The patient is not nervous/anxious.      Objective:   BP (!) 142/74 (BP Location: Right Arm, Patient Position: Sitting, Cuff Size: Large)   Pulse 77   Temp 98.6 F (37 C) (Temporal)   Resp 12   Ht 5\' 10"  (1.778 m)   Wt 241 lb 0.6 oz (109.3 kg)   SpO2 95%   BMI 34.59 kg/m   Physical Exam  Constitutional: He is oriented to person, place, and time. He appears well-developed and well-nourished.  HENT:  Head: Normocephalic and atraumatic.  Right Ear: External ear normal.  Left Ear: External ear normal.  Nose: Nose normal.  Mouth/Throat: Oropharynx is clear and moist.  Eyes: Pupils are equal, round, and reactive to light. Conjunctivae and EOM are normal. No scleral icterus.  Neck: Normal range of motion. Neck supple.  Cardiovascular: Normal rate, regular rhythm and normal heart  sounds.  Pulmonary/Chest: Effort normal and breath sounds normal.  Abdominal: Soft. Bowel sounds are normal.  Musculoskeletal: Normal range of motion.  Neurological: He is alert and oriented to person, place, and time. No cranial nerve deficit.  Skin: Skin is warm and dry.  Psychiatric: He has a normal mood and affect. His behavior is normal. Judgment and thought content normal.  Vitals reviewed.   Depression screen [facility] 2/9 08/10/2017 03/07/2017 11/02/2016  Decreased Interest 2 3 0  Down, Depressed, Hopeless 1 3 0  PHQ - 2 Score 3 6 0  Altered sleeping 1 3 -  Tired, decreased energy 2 3 -  Change in appetite 1 3 -  Feeling bad or failure about yourself  0 3 -  Trouble concentrating 1 0 -  Moving slowly or fidgety/restless 0 0 -  Suicidal thoughts 0 0 -  PHQ-9 Score 8 18 -  Difficult doing work/chores Somewhat difficult Very difficult -    Assessment and Plan  1. Vertigo Long history of chronic, intermittent/sporadic vertigo.  Has had work-up by ENT and neurology.  Patient defers any further work-up at this time.  Does request refill of meclizine.  He was counseled concerning sedation precautions of this medication.  He voiced understanding.  He was counseled regarding worrisome signs and symptoms of vertigo and if those occur to please contact medical help or call 911.  He voiced understanding. - meclizine (MEDI-MECLIZINE) 25 MG tablet; Take 1 tablet (25 mg total) by mouth 3 (three) times daily as needed for dizziness.  Dispense: 60 tablet;  Refill: 0  2. Essential hypertension Long discussion with patient today regarding his blood pressure medication.  At this time we are going to change his dosing of Norvasc.  He will take Norvasc 5 mg p.o. twice daily.  He will continue to monitor his blood pressure.  He will bring his readings and his cough to the next visit. - amLODipine (NORVASC) 5 MG tablet; Take 1 tablet (5 mg total) by mouth 2 (two) times daily.  Dispense: 60 tablet; Refill:  1 Continue to stay off the metoprolol.  Call with any questions or concerns.  Patient asked to call with problems of blood pressure rather than to continue to adjust his dosing.  He voiced understanding. Return in about 1 month (around 09/07/2017) for blood pressure. Caren Macadam, MD 08/10/2017

## 2017-08-10 NOTE — Progress Notes (Signed)
[place], [place] [address]   CLINIC:  Medical Oncology/Hematology  PCP:  Caren Macadam, [place] [address] [phone]   REASON FOR VISIT:  Follow-up for follow-up of high-grade urothelial carcinoma.  CURRENT THERAPY: Close observation.  BRIEF ONCOLOGIC HISTORY:  Per Dr. Hazeline Junker note:  "Mr. Solanki is a 67 year old gentleman currently of [place], [place] where he lived the majority of his life. He is a gentleman without any significant comorbid conditions I was diagnosed with urothelial carcinoma in 2014. At that time he presented with hematuria and a CT scan showed a 3.8 x 6.3 x 6.0 cm right renal mass. He underwent a right nephrectomy, ureterectomy and retroperitoneal lymph node dissection done by Dr. Brendia Sacks in July 2014. At that time. 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 He subsequently received chemotherapy with cisplatin and gemcitabine for 4 cycles concluded in November 2015 under the care of Dr. Thera Flake at [facility]. He is subsequently developed to the left ureter tumors in April 2016 and required ablation. He hadn't recurrent tumor in October 2016 and required repeat ablation of these tumors and the ureter. History of systemic therapy with a dose reduced on Votrient given the fact that he had FGFR mutation.History of recurrent disease and subsequently underwent complete resection of his left kidney, ureter and bladder in February 2017. He is pathology showed a T3aurothelial carcinoma and the ureter and bladder was 0 out of 9 lymph nodes involved. The procedure left him on dialysis and currently receiving peritoneal dialysis at home.  INTERVAL HISTORY:  Mr. Adrian 67 y.o. male returns for follow-up of urothelial carcinoma.  He is undergoing peritoneal dialysis at home without any problems.  He had a graft placed in the left  forearm as a back-up if the peritoneal dialysis catheter stops working.  He denies any shortness of breath.  Denies any fevers, night sweats or weight loss.  Has occasional numbness in the left side of the face, thinks it is coming from nerve impingement in the neck.  He is scheduled to see his neurosurgeon.  His fingers also have numbness on and off resulting from the same problem.  He had 2 neck surgeries in the past.  He denies any new onset pains.   REVIEW OF SYSTEMS:  Review of Systems  Constitutional: Positive for fatigue.  Neurological: Positive for dizziness and numbness.  All other systems reviewed and are negative.    PAST MEDICAL/SURGICAL HISTORY:  Past Medical History:  Diagnosis Date  . Anemia   . Anxiety   . Arthritis   . Cancer [facility])    bladder, ureter, Bil kidneys  . CHF (congestive heart failure) ([place])   . Current moderate episode of major depressive disorder without prior episode ([place]) 06/28/2017  . Dermatitis    scaly bump occurs every few months, pt uses cortizone cream and it goes away  . ESRD (end stage renal disease) on dialysis ([place])    Bilateral Nephrectomies- due to cancer  . GERD (gastroesophageal reflux disease) 04/07/2015  . History of blood transfusion   . Hypertension   . Lumbar back pain   . Mental disorder   . Non compliance w medication regimen    written in error  . Pleural effusion, right    s/p right thoracentesis 10/07/15 (no malignancy by cytology report)  . Shortness of breath dyspnea    Lying down gets short  of breath   Past Surgical History:  Procedure Laterality Date  . AV FISTULA PLACEMENT Right 06/05/2015   Procedure: ARTERIOVENOUS (AV) FISTULA CREATION RIGHT ARM;  Surgeon: Angelia Mould, MD;  Location: [place];  Service: Vascular;  Laterality: Right;  . AV FISTULA PLACEMENT Left 07/25/2017   Procedure: RADIOCEPHALIC  ARTERIOVENOUS FISTULA LEFT ARM;  Surgeon: Conrad Herscher, MD;  Location: [place];  Service: Vascular;  Laterality: Left;   . BLADDER SURGERY  04-06-2015   removal of bladder/ [facility]   . CAPD INSERTION N/A 06/24/2016   Procedure: LAPAROSCOPIC INSERTION CONTINUOUS AMBULATORY PERITONEAL DIALYSIS  (CAPD) CATHETER;  Surgeon: Clovis Riley, MD;  Location: [place];  Service: General;  Laterality: N/A;  . CARPAL TUNNEL RELEASE     left hand x 2; right hand x 1; right elbow  . CERVICAL FUSION     x 2  . EYE SURGERY Bilateral    Cataract removal  . FISTULA SUPERFICIALIZATION Right 10/30/2015   Procedure: SUPERFICIALIZATION BRACHIOCEPHALIC ARTERIOVENOUS FISTULA Right Arm;  Surgeon: Angelia Mould, MD;  Location: [place];  Service: Vascular;  Laterality: Right;  . Hemocatheter    . IR GENERIC HISTORICAL Right 04/29/2016   IR THROMBECTOMY AV FISTULA W/THROMBOLYSIS/PTA INC/SHUNT/IMG RIGHT 04/29/2016 Arne Cleveland, MD [facility]  . IR GENERIC HISTORICAL  04/29/2016   IR US GUIDE VASC ACCESS RIGHT 04/29/2016 Arne Cleveland, MD [facility]  . IR THORACENTESIS ASP PLEURAL SPACE W/IMG GUIDE  12/06/2016  . KNEE ARTHROSCOPY Bilateral    bilateral  . KNEE ARTHROSCOPY WITH MEDIAL MENISECTOMY Left 02/26/2013   Procedure: KNEE ARTHROSCOPY WITH MEDIAL MENISECTOMY;  Surgeon: Garald Balding, MD;  Location: [place];  Service: Orthopedics;  Laterality: Left;  . LIGATION OF COMPETING BRANCHES OF ARTERIOVENOUS FISTULA Right 10/30/2015   Procedure: LIGATION OF COMPETING BRANCHES OF BRACHIOCEPHALIC ARTERIOVENOUS FISTULA;  Surgeon: Angelia Mould, MD;  Location: [place];  Service: Vascular;  Laterality: Right;  . NEPHRECTOMY Right September 12, 2012  . NEPHRECTOMY Left 04-06-2015   [place] Right 09/07/2015   Procedure: Fistulagram;  Surgeon: Angelia Mould, MD;  Location: [place] CV [facility];  Service: Cardiovascular;  Laterality: Right;  ARM  . PERIPHERAL VASCULAR CATHETERIZATION Right 09/07/2015   Procedure: Peripheral Vascular Balloon Angioplasty;   Surgeon: Angelia Mould, MD;  Location: [place] [facility];  Service: Cardiovascular;  Laterality: Right;  upper arm venous  . PROSTATECTOMY  04-06-2015   [place] Bilateral    bilateral  . TOTAL KNEE ARTHROPLASTY  01/24/2012   Procedure: TOTAL KNEE ARTHROPLASTY;  Surgeon: Garald Balding, MD;  Location: [place];  Service: Orthopedics;  Laterality: Right;  RIGHT TOTAL KNEE REPLACEMENT     SOCIAL HISTORY:  Social History   Socioeconomic History  . Marital status: Widowed    Spouse name: Not on file  . Number of children: Not on file  . Years of education: Not on file  . Highest education level: Not on file  Occupational History  . Not on file  Social Needs  . Financial resource strain: Not on file  . Food insecurity:    Worry: Not on file    Inability: Not on file  . Transportation needs:    Medical: Not on file    Non-medical: Not on file  Tobacco Use  . Smoking status: Never Smoker  . Smokeless tobacco: Never Used  Substance and Sexual  Activity  . Alcohol use: Not Currently    Alcohol/week: 0.0 oz    Comment: heavy drinker in the past  . Drug use: Never  . Sexual activity: Not on file  Lifestyle  . Physical activity:    Days per week: Not on file    Minutes per session: Not on file  . Stress: Not on file  Relationships  . Social connections:    Talks on phone: Not on file    Gets together: Not on file    Attends religious service: Not on file    Active member of club or organization: Not on file    Attends meetings of clubs or organizations: Not on file    Relationship status: Not on file  . Intimate partner violence:    Fear of current or ex partner: Not on file    Emotionally abused: Not on file    Physically abused: Not on file    Forced sexual activity: Not on file  Other Topics Concern  . Not on file  Social History Narrative   Lives alone. Manages well per patient report. Does not cook much. Eats out a  lot. Eats all food groups. Does not eat much fruit. Married in the past. Has one son. Used to drive a truck.     FAMILY HISTORY:  Family History  Problem Relation Age of Onset  . Cancer Mother   . Hypertension Father   . Atrial fibrillation Father   . Heart disease Father     CURRENT MEDICATIONS:  Outpatient Encounter Medications as of 08/10/2017  Medication Sig  . ALPRAZolam (XANAX) 1 MG tablet Take 1 mg by mouth daily as needed for anxiety or sleep.  Marland Kitchen amLODipine (NORVASC) 5 MG tablet Take 1 tablet (5 mg total) by mouth 2 (two) times daily.  . clobetasol cream (TEMOVATE) 8.18 % Apply 1 application topically 2 (two) times daily as needed (skin irritation).   . fluticasone (FLONASE) 50 MCG/ACT nasal spray Place 1 spray into both nostrils daily.   Marland Kitchen HYDROcodone-acetaminophen (NORCO) 5-325 MG tablet Take 1 tablet by mouth every 6 (six) hours as needed for moderate pain.  Marland Kitchen lanthanum (FOSRENOL) 500 MG chewable tablet Chew 500 mg by mouth 3 (three) times daily with meals.  . meclizine (MEDI-MECLIZINE) 25 MG tablet Take 1 tablet (25 mg total) by mouth 3 (three) times daily as needed for dizziness.  [name] Glycol-Propyl Glycol (SYSTANE OP) Place 1 drop into both eyes daily as needed (for dry, itchy eyes).  . triamcinolone cream (KENALOG) 0.1 % Apply 1 application topically 2 (two) times daily. As directed. Do not apply to face or skin folds  . [DISCONTINUED] ibuprofen (ADVIL,MOTRIN) 200 MG tablet Take 400 mg by mouth every 6 (six) hours as needed for moderate pain.  . [DISCONTINUED] metoprolol tartrate (LOPRESSOR) 25 MG tablet Take 25 mg by mouth daily as needed (if blood pressure is higher than 120/90).    No facility-administered encounter medications on file as of 08/10/2017.     ALLERGIES:  No Known Allergies   PHYSICAL EXAM:  ECOG Performance status: 1  Vitals:   08/10/17 1444  BP: 129/67  Pulse: 64  Resp: 16  Temp: 98.3 F (36.8 C)  SpO2: 100%   Filed Weights    08/10/17 1444  Weight: 242 lb 4.8 oz (109.9 kg)    Physical Exam HEENT: Oropharynx has no thrush or mucositis. Chest: Bilaterally clear to auscultation. Cardiovascular: S1-S2 regular rate and rhythm. Abdomen: No masses palpable.  Peritoneal dialysis catheter in the left mid quadrant.  Extremities: No edema cyanosis  LABORATORY DATA:  I have reviewed the labs as listed.  CBC    Component Value Date/Time   WBC 12.7 (H) 08/07/2017 1050   RBC 3.55 (L) 08/07/2017 1050   HGB 11.5 (L) 08/07/2017 1050   HCT 34.9 (L) 08/07/2017 1050   PLT 261 08/07/2017 1050   MCV 98.3 08/07/2017 1050   MCH 32.4 08/07/2017 1050   MCHC 33.0 08/07/2017 1050   RDW 14.1 08/07/2017 1050   LYMPHSABS 1.5 08/07/2017 1050   MONOABS 1.0 08/07/2017 1050   EOSABS 0.4 08/07/2017 1050   BASOSABS 0.0 08/07/2017 1050   CMP Latest Ref Rng & Units 08/07/2017 07/25/2017 11/28/2016  Glucose 65 - 99 mg/dL 103(H) 96 105(H)  BUN 6 - 20 mg/dL 84(H) - 62(H)  Creatinine 0.61 - 1.24 mg/dL 17.42(H) - 15.59(H)  Sodium 135 - 145 mmol/L 132(L) 132(L) 123(L)  Potassium 3.5 - 5.1 mmol/L 5.5(H) 4.9 4.7  Chloride 101 - 111 mmol/L 88(L) - 84(L)  CO2 22 - 32 mmol/L 26 - 27  Calcium 8.9 - 10.3 mg/dL 9.7 - 8.6(L)  Total Protein 6.5 - 8.1 g/dL 6.9 - 6.1(L)  Total Bilirubin 0.3 - 1.2 mg/dL 1.0 - 0.6  Alkaline Phos 38 - 126 U/L 100 - 72  AST 15 - 41 U/L 16 - 26  ALT 17 - 63 U/L 11(L) - 18       DIAGNOSTIC IMAGING:  *The following radiologic images and reports have been reviewed independently and agree with below findings.      ASSESSMENT & PLAN:   Urothelial cancer ([place]) ASSESSMENT/PLAN 1.  High-grade papillary urothelial carcinoma -right nephrectomy, ureterectomy, and retroperitoneal lymph node dissection with Dr. Brendia Sacks on 09/12/2012. Pathology revealed high-grade papillary urothelial carcinoma pT3N0.  Patient was never smoker.  Family history included colon cancer in paternal grandmother and prostate cancer in a maternal  uncle. -Chemotherapy with every 3 week cisplatin 35 mg/m2 and gemcitabine 1000 mg/m2 on days 1 & 8 on 10/21/2013 and completed 4 cycles on 12/30/2013. -S/p ablation of 2 left ureter papillary tumors in April 2016.  -S/p ablation of multiple ureteral and collecting system tumors in July 2016 and again in October 2016, with residual disease remaining. -On basis of FGFR mutation, Pazopanib 800 mg daily started 01/18/2015, dose-reduced to 600mg  daily on 02/02/2015, stopped 03/10/2015 for LFT abnormalities and upcoming surgery. -04/06/2015 complete resection of the left kidney, ureter and bladder, pathology showing T3a urothelial carcinoma of the ureter and bladder, 0/9 lymph nodes - Recurrent right pleural effusion, cytology on 12/06/2016 negative for malignant cells. - Currently has no symptoms including new onset pains, weight loss.  I have discussed the results of the CT scan of the chest, abdomen and pelvis dated 08/08/2017 which did not show any evidence of recurrence. -He will come back in 6 months with repeat CT scan.      Orders placed this encounter:  Orders Placed This Encounter  Procedures  . CT Chest W Contrast  . CT Abdomen Pelvis W Contrast  . CBC with Differential  . Comprehensive metabolic panel      Derek Jack, MD [place] [phone]

## 2017-08-11 DIAGNOSIS — T82511D Breakdown (mechanical) of surgically created arteriovenous shunt, subsequent encounter: Secondary | ICD-10-CM | POA: Diagnosis not present

## 2017-08-11 DIAGNOSIS — I120 Hypertensive chronic kidney disease with stage 5 chronic kidney disease or end stage renal disease: Secondary | ICD-10-CM | POA: Diagnosis not present

## 2017-08-11 DIAGNOSIS — N186 End stage renal disease: Secondary | ICD-10-CM | POA: Diagnosis not present

## 2017-08-11 DIAGNOSIS — N2581 Secondary hyperparathyroidism of renal origin: Secondary | ICD-10-CM | POA: Diagnosis not present

## 2017-08-11 DIAGNOSIS — Z992 Dependence on renal dialysis: Secondary | ICD-10-CM | POA: Diagnosis not present

## 2017-08-11 DIAGNOSIS — D509 Iron deficiency anemia, unspecified: Secondary | ICD-10-CM | POA: Diagnosis not present

## 2017-08-12 DIAGNOSIS — D509 Iron deficiency anemia, unspecified: Secondary | ICD-10-CM | POA: Diagnosis not present

## 2017-08-12 DIAGNOSIS — N186 End stage renal disease: Secondary | ICD-10-CM | POA: Diagnosis not present

## 2017-08-12 DIAGNOSIS — N2581 Secondary hyperparathyroidism of renal origin: Secondary | ICD-10-CM | POA: Diagnosis not present

## 2017-08-12 DIAGNOSIS — Z992 Dependence on renal dialysis: Secondary | ICD-10-CM | POA: Diagnosis not present

## 2017-08-13 DIAGNOSIS — Z992 Dependence on renal dialysis: Secondary | ICD-10-CM | POA: Diagnosis not present

## 2017-08-13 DIAGNOSIS — N2581 Secondary hyperparathyroidism of renal origin: Secondary | ICD-10-CM | POA: Diagnosis not present

## 2017-08-13 DIAGNOSIS — D509 Iron deficiency anemia, unspecified: Secondary | ICD-10-CM | POA: Diagnosis not present

## 2017-08-13 DIAGNOSIS — N186 End stage renal disease: Secondary | ICD-10-CM | POA: Diagnosis not present

## 2017-08-14 DIAGNOSIS — D509 Iron deficiency anemia, unspecified: Secondary | ICD-10-CM | POA: Diagnosis not present

## 2017-08-14 DIAGNOSIS — N186 End stage renal disease: Secondary | ICD-10-CM | POA: Diagnosis not present

## 2017-08-14 DIAGNOSIS — Z992 Dependence on renal dialysis: Secondary | ICD-10-CM | POA: Diagnosis not present

## 2017-08-14 DIAGNOSIS — N2581 Secondary hyperparathyroidism of renal origin: Secondary | ICD-10-CM | POA: Diagnosis not present

## 2017-08-15 DIAGNOSIS — Z992 Dependence on renal dialysis: Secondary | ICD-10-CM | POA: Diagnosis not present

## 2017-08-15 DIAGNOSIS — M47812 Spondylosis without myelopathy or radiculopathy, cervical region: Secondary | ICD-10-CM | POA: Diagnosis not present

## 2017-08-15 DIAGNOSIS — N186 End stage renal disease: Secondary | ICD-10-CM | POA: Diagnosis not present

## 2017-08-15 DIAGNOSIS — D509 Iron deficiency anemia, unspecified: Secondary | ICD-10-CM | POA: Diagnosis not present

## 2017-08-15 DIAGNOSIS — R42 Dizziness and giddiness: Secondary | ICD-10-CM | POA: Diagnosis not present

## 2017-08-15 DIAGNOSIS — N2581 Secondary hyperparathyroidism of renal origin: Secondary | ICD-10-CM | POA: Diagnosis not present

## 2017-08-16 DIAGNOSIS — Z992 Dependence on renal dialysis: Secondary | ICD-10-CM | POA: Diagnosis not present

## 2017-08-16 DIAGNOSIS — D509 Iron deficiency anemia, unspecified: Secondary | ICD-10-CM | POA: Diagnosis not present

## 2017-08-16 DIAGNOSIS — N186 End stage renal disease: Secondary | ICD-10-CM | POA: Diagnosis not present

## 2017-08-16 DIAGNOSIS — N2581 Secondary hyperparathyroidism of renal origin: Secondary | ICD-10-CM | POA: Diagnosis not present

## 2017-08-17 DIAGNOSIS — Z992 Dependence on renal dialysis: Secondary | ICD-10-CM | POA: Diagnosis not present

## 2017-08-17 DIAGNOSIS — R42 Dizziness and giddiness: Secondary | ICD-10-CM | POA: Diagnosis not present

## 2017-08-17 DIAGNOSIS — N186 End stage renal disease: Secondary | ICD-10-CM | POA: Diagnosis not present

## 2017-08-17 DIAGNOSIS — I6782 Cerebral ischemia: Secondary | ICD-10-CM | POA: Diagnosis not present

## 2017-08-17 DIAGNOSIS — D509 Iron deficiency anemia, unspecified: Secondary | ICD-10-CM | POA: Diagnosis not present

## 2017-08-17 DIAGNOSIS — N2581 Secondary hyperparathyroidism of renal origin: Secondary | ICD-10-CM | POA: Diagnosis not present

## 2017-08-18 DIAGNOSIS — Z992 Dependence on renal dialysis: Secondary | ICD-10-CM | POA: Diagnosis not present

## 2017-08-18 DIAGNOSIS — N2581 Secondary hyperparathyroidism of renal origin: Secondary | ICD-10-CM | POA: Diagnosis not present

## 2017-08-18 DIAGNOSIS — D509 Iron deficiency anemia, unspecified: Secondary | ICD-10-CM | POA: Diagnosis not present

## 2017-08-18 DIAGNOSIS — N186 End stage renal disease: Secondary | ICD-10-CM | POA: Diagnosis not present

## 2017-08-19 DIAGNOSIS — N186 End stage renal disease: Secondary | ICD-10-CM | POA: Diagnosis not present

## 2017-08-19 DIAGNOSIS — Z992 Dependence on renal dialysis: Secondary | ICD-10-CM | POA: Diagnosis not present

## 2017-08-19 DIAGNOSIS — N2581 Secondary hyperparathyroidism of renal origin: Secondary | ICD-10-CM | POA: Diagnosis not present

## 2017-08-19 DIAGNOSIS — D509 Iron deficiency anemia, unspecified: Secondary | ICD-10-CM | POA: Diagnosis not present

## 2017-08-20 DIAGNOSIS — N186 End stage renal disease: Secondary | ICD-10-CM | POA: Diagnosis not present

## 2017-08-20 DIAGNOSIS — N2581 Secondary hyperparathyroidism of renal origin: Secondary | ICD-10-CM | POA: Diagnosis not present

## 2017-08-20 DIAGNOSIS — D509 Iron deficiency anemia, unspecified: Secondary | ICD-10-CM | POA: Diagnosis not present

## 2017-08-20 DIAGNOSIS — Z992 Dependence on renal dialysis: Secondary | ICD-10-CM | POA: Diagnosis not present

## 2017-08-21 DIAGNOSIS — Z992 Dependence on renal dialysis: Secondary | ICD-10-CM | POA: Diagnosis not present

## 2017-08-21 DIAGNOSIS — D509 Iron deficiency anemia, unspecified: Secondary | ICD-10-CM | POA: Diagnosis not present

## 2017-08-21 DIAGNOSIS — N2581 Secondary hyperparathyroidism of renal origin: Secondary | ICD-10-CM | POA: Diagnosis not present

## 2017-08-21 DIAGNOSIS — N186 End stage renal disease: Secondary | ICD-10-CM | POA: Diagnosis not present

## 2017-08-22 DIAGNOSIS — N186 End stage renal disease: Secondary | ICD-10-CM | POA: Diagnosis not present

## 2017-08-22 DIAGNOSIS — D509 Iron deficiency anemia, unspecified: Secondary | ICD-10-CM | POA: Diagnosis not present

## 2017-08-22 DIAGNOSIS — N2581 Secondary hyperparathyroidism of renal origin: Secondary | ICD-10-CM | POA: Diagnosis not present

## 2017-08-22 DIAGNOSIS — Z992 Dependence on renal dialysis: Secondary | ICD-10-CM | POA: Diagnosis not present

## 2017-08-23 DIAGNOSIS — N2581 Secondary hyperparathyroidism of renal origin: Secondary | ICD-10-CM | POA: Diagnosis not present

## 2017-08-23 DIAGNOSIS — N186 End stage renal disease: Secondary | ICD-10-CM | POA: Diagnosis not present

## 2017-08-23 DIAGNOSIS — Z992 Dependence on renal dialysis: Secondary | ICD-10-CM | POA: Diagnosis not present

## 2017-08-23 DIAGNOSIS — D509 Iron deficiency anemia, unspecified: Secondary | ICD-10-CM | POA: Diagnosis not present

## 2017-08-24 ENCOUNTER — Telehealth: Payer: Self-pay | Admitting: Family Medicine

## 2017-08-24 DIAGNOSIS — Z992 Dependence on renal dialysis: Secondary | ICD-10-CM | POA: Diagnosis not present

## 2017-08-24 DIAGNOSIS — R42 Dizziness and giddiness: Secondary | ICD-10-CM

## 2017-08-24 DIAGNOSIS — D509 Iron deficiency anemia, unspecified: Secondary | ICD-10-CM | POA: Diagnosis not present

## 2017-08-24 DIAGNOSIS — N186 End stage renal disease: Secondary | ICD-10-CM | POA: Diagnosis not present

## 2017-08-24 DIAGNOSIS — N2581 Secondary hyperparathyroidism of renal origin: Secondary | ICD-10-CM | POA: Diagnosis not present

## 2017-08-24 NOTE — Telephone Encounter (Signed)
Please find out what he is requesting the referral for. Gwen Her. Mannie Stabile, MD

## 2017-08-24 NOTE — Telephone Encounter (Signed)
He stated that he saw you for "dizzy spells". The medication you prescribed may help it from getting "real bad" but he has to hang on to the wall to walk, he vomits, and gets sick on his stomach, and sweats really bad. This is why he wants the referral. He has also seen ENT for it but they could never find anything wrong.

## 2017-08-24 NOTE — Telephone Encounter (Signed)
Pt is calling wanting a referral [facility]

## 2017-08-25 DIAGNOSIS — Z992 Dependence on renal dialysis: Secondary | ICD-10-CM | POA: Diagnosis not present

## 2017-08-25 DIAGNOSIS — N186 End stage renal disease: Secondary | ICD-10-CM | POA: Diagnosis not present

## 2017-08-25 DIAGNOSIS — N2581 Secondary hyperparathyroidism of renal origin: Secondary | ICD-10-CM | POA: Diagnosis not present

## 2017-08-25 DIAGNOSIS — D509 Iron deficiency anemia, unspecified: Secondary | ICD-10-CM | POA: Diagnosis not present

## 2017-08-25 NOTE — Telephone Encounter (Signed)
Spoke with patient and advised him referral had been placed with verbal understanding.

## 2017-08-25 NOTE — Telephone Encounter (Signed)
Advise referral placed to [facility]. Gwen Her. Mannie Stabile, MD

## 2017-08-26 DIAGNOSIS — Z992 Dependence on renal dialysis: Secondary | ICD-10-CM | POA: Diagnosis not present

## 2017-08-26 DIAGNOSIS — D509 Iron deficiency anemia, unspecified: Secondary | ICD-10-CM | POA: Diagnosis not present

## 2017-08-26 DIAGNOSIS — N186 End stage renal disease: Secondary | ICD-10-CM | POA: Diagnosis not present

## 2017-08-26 DIAGNOSIS — N2581 Secondary hyperparathyroidism of renal origin: Secondary | ICD-10-CM | POA: Diagnosis not present

## 2017-08-27 DIAGNOSIS — N186 End stage renal disease: Secondary | ICD-10-CM | POA: Diagnosis not present

## 2017-08-27 DIAGNOSIS — D509 Iron deficiency anemia, unspecified: Secondary | ICD-10-CM | POA: Diagnosis not present

## 2017-08-27 DIAGNOSIS — N2581 Secondary hyperparathyroidism of renal origin: Secondary | ICD-10-CM | POA: Diagnosis not present

## 2017-08-27 DIAGNOSIS — Z992 Dependence on renal dialysis: Secondary | ICD-10-CM | POA: Diagnosis not present

## 2017-08-28 DIAGNOSIS — Z992 Dependence on renal dialysis: Secondary | ICD-10-CM | POA: Diagnosis not present

## 2017-08-28 DIAGNOSIS — D509 Iron deficiency anemia, unspecified: Secondary | ICD-10-CM | POA: Diagnosis not present

## 2017-08-28 DIAGNOSIS — N2581 Secondary hyperparathyroidism of renal origin: Secondary | ICD-10-CM | POA: Diagnosis not present

## 2017-08-28 DIAGNOSIS — N186 End stage renal disease: Secondary | ICD-10-CM | POA: Diagnosis not present

## 2017-08-29 DIAGNOSIS — D509 Iron deficiency anemia, unspecified: Secondary | ICD-10-CM | POA: Diagnosis not present

## 2017-08-29 DIAGNOSIS — Z992 Dependence on renal dialysis: Secondary | ICD-10-CM | POA: Diagnosis not present

## 2017-08-29 DIAGNOSIS — N186 End stage renal disease: Secondary | ICD-10-CM | POA: Diagnosis not present

## 2017-08-29 DIAGNOSIS — N2581 Secondary hyperparathyroidism of renal origin: Secondary | ICD-10-CM | POA: Diagnosis not present

## 2017-08-30 DIAGNOSIS — Z992 Dependence on renal dialysis: Secondary | ICD-10-CM | POA: Diagnosis not present

## 2017-08-30 DIAGNOSIS — N186 End stage renal disease: Secondary | ICD-10-CM | POA: Diagnosis not present

## 2017-08-30 DIAGNOSIS — R42 Dizziness and giddiness: Secondary | ICD-10-CM | POA: Diagnosis not present

## 2017-08-30 DIAGNOSIS — N2581 Secondary hyperparathyroidism of renal origin: Secondary | ICD-10-CM | POA: Diagnosis not present

## 2017-08-30 DIAGNOSIS — D509 Iron deficiency anemia, unspecified: Secondary | ICD-10-CM | POA: Diagnosis not present

## 2017-08-31 DIAGNOSIS — D509 Iron deficiency anemia, unspecified: Secondary | ICD-10-CM | POA: Diagnosis not present

## 2017-08-31 DIAGNOSIS — N2581 Secondary hyperparathyroidism of renal origin: Secondary | ICD-10-CM | POA: Diagnosis not present

## 2017-08-31 DIAGNOSIS — Z992 Dependence on renal dialysis: Secondary | ICD-10-CM | POA: Diagnosis not present

## 2017-08-31 DIAGNOSIS — N186 End stage renal disease: Secondary | ICD-10-CM | POA: Diagnosis not present

## 2017-09-01 DIAGNOSIS — D509 Iron deficiency anemia, unspecified: Secondary | ICD-10-CM | POA: Diagnosis not present

## 2017-09-01 DIAGNOSIS — Z992 Dependence on renal dialysis: Secondary | ICD-10-CM | POA: Diagnosis not present

## 2017-09-01 DIAGNOSIS — N2581 Secondary hyperparathyroidism of renal origin: Secondary | ICD-10-CM | POA: Diagnosis not present

## 2017-09-01 DIAGNOSIS — N186 End stage renal disease: Secondary | ICD-10-CM | POA: Diagnosis not present

## 2017-09-02 DIAGNOSIS — N2581 Secondary hyperparathyroidism of renal origin: Secondary | ICD-10-CM | POA: Diagnosis not present

## 2017-09-02 DIAGNOSIS — Z992 Dependence on renal dialysis: Secondary | ICD-10-CM | POA: Diagnosis not present

## 2017-09-02 DIAGNOSIS — N186 End stage renal disease: Secondary | ICD-10-CM | POA: Diagnosis not present

## 2017-09-02 DIAGNOSIS — D509 Iron deficiency anemia, unspecified: Secondary | ICD-10-CM | POA: Diagnosis not present

## 2017-09-03 DIAGNOSIS — N2581 Secondary hyperparathyroidism of renal origin: Secondary | ICD-10-CM | POA: Diagnosis not present

## 2017-09-03 DIAGNOSIS — Z992 Dependence on renal dialysis: Secondary | ICD-10-CM | POA: Diagnosis not present

## 2017-09-03 DIAGNOSIS — N186 End stage renal disease: Secondary | ICD-10-CM | POA: Diagnosis not present

## 2017-09-03 DIAGNOSIS — D509 Iron deficiency anemia, unspecified: Secondary | ICD-10-CM | POA: Diagnosis not present

## 2017-09-04 DIAGNOSIS — D509 Iron deficiency anemia, unspecified: Secondary | ICD-10-CM | POA: Diagnosis not present

## 2017-09-04 DIAGNOSIS — N2581 Secondary hyperparathyroidism of renal origin: Secondary | ICD-10-CM | POA: Diagnosis not present

## 2017-09-04 DIAGNOSIS — Z992 Dependence on renal dialysis: Secondary | ICD-10-CM | POA: Diagnosis not present

## 2017-09-04 DIAGNOSIS — N186 End stage renal disease: Secondary | ICD-10-CM | POA: Diagnosis not present

## 2017-09-05 DIAGNOSIS — N186 End stage renal disease: Secondary | ICD-10-CM | POA: Diagnosis not present

## 2017-09-05 DIAGNOSIS — D509 Iron deficiency anemia, unspecified: Secondary | ICD-10-CM | POA: Diagnosis not present

## 2017-09-05 DIAGNOSIS — N2581 Secondary hyperparathyroidism of renal origin: Secondary | ICD-10-CM | POA: Diagnosis not present

## 2017-09-05 DIAGNOSIS — Z992 Dependence on renal dialysis: Secondary | ICD-10-CM | POA: Diagnosis not present

## 2017-09-06 ENCOUNTER — Encounter (HOSPITAL_COMMUNITY): Payer: Self-pay

## 2017-09-06 DIAGNOSIS — Z992 Dependence on renal dialysis: Secondary | ICD-10-CM | POA: Diagnosis not present

## 2017-09-06 DIAGNOSIS — N2581 Secondary hyperparathyroidism of renal origin: Secondary | ICD-10-CM | POA: Diagnosis not present

## 2017-09-06 DIAGNOSIS — D509 Iron deficiency anemia, unspecified: Secondary | ICD-10-CM | POA: Diagnosis not present

## 2017-09-06 DIAGNOSIS — N186 End stage renal disease: Secondary | ICD-10-CM | POA: Diagnosis not present

## 2017-09-07 DIAGNOSIS — D509 Iron deficiency anemia, unspecified: Secondary | ICD-10-CM | POA: Diagnosis not present

## 2017-09-07 DIAGNOSIS — N186 End stage renal disease: Secondary | ICD-10-CM | POA: Diagnosis not present

## 2017-09-07 DIAGNOSIS — Z992 Dependence on renal dialysis: Secondary | ICD-10-CM | POA: Diagnosis not present

## 2017-09-07 DIAGNOSIS — N2581 Secondary hyperparathyroidism of renal origin: Secondary | ICD-10-CM | POA: Diagnosis not present

## 2017-09-08 DIAGNOSIS — Z992 Dependence on renal dialysis: Secondary | ICD-10-CM | POA: Diagnosis not present

## 2017-09-08 DIAGNOSIS — D509 Iron deficiency anemia, unspecified: Secondary | ICD-10-CM | POA: Diagnosis not present

## 2017-09-08 DIAGNOSIS — N186 End stage renal disease: Secondary | ICD-10-CM | POA: Diagnosis not present

## 2017-09-08 DIAGNOSIS — N2581 Secondary hyperparathyroidism of renal origin: Secondary | ICD-10-CM | POA: Diagnosis not present

## 2017-09-09 DIAGNOSIS — D509 Iron deficiency anemia, unspecified: Secondary | ICD-10-CM | POA: Diagnosis not present

## 2017-09-09 DIAGNOSIS — Z992 Dependence on renal dialysis: Secondary | ICD-10-CM | POA: Diagnosis not present

## 2017-09-09 DIAGNOSIS — N186 End stage renal disease: Secondary | ICD-10-CM | POA: Diagnosis not present

## 2017-09-09 DIAGNOSIS — N2581 Secondary hyperparathyroidism of renal origin: Secondary | ICD-10-CM | POA: Diagnosis not present

## 2017-09-10 DIAGNOSIS — N186 End stage renal disease: Secondary | ICD-10-CM | POA: Diagnosis not present

## 2017-09-10 DIAGNOSIS — N2581 Secondary hyperparathyroidism of renal origin: Secondary | ICD-10-CM | POA: Diagnosis not present

## 2017-09-10 DIAGNOSIS — Z992 Dependence on renal dialysis: Secondary | ICD-10-CM | POA: Diagnosis not present

## 2017-09-10 DIAGNOSIS — D509 Iron deficiency anemia, unspecified: Secondary | ICD-10-CM | POA: Diagnosis not present

## 2017-09-11 ENCOUNTER — Institutional Professional Consult (permissible substitution): Payer: Self-pay | Admitting: Pulmonary Disease

## 2017-09-11 ENCOUNTER — Ambulatory Visit (INDEPENDENT_AMBULATORY_CARE_PROVIDER_SITE_OTHER): Payer: Medicare Other | Admitting: Pulmonary Disease

## 2017-09-11 ENCOUNTER — Encounter: Payer: Self-pay | Admitting: Pulmonary Disease

## 2017-09-11 VITALS — BP 100/76 | HR 89 | Ht 70.0 in | Wt 240.0 lb

## 2017-09-11 DIAGNOSIS — R0602 Shortness of breath: Secondary | ICD-10-CM

## 2017-09-11 DIAGNOSIS — R42 Dizziness and giddiness: Secondary | ICD-10-CM

## 2017-09-11 DIAGNOSIS — N2581 Secondary hyperparathyroidism of renal origin: Secondary | ICD-10-CM | POA: Diagnosis not present

## 2017-09-11 DIAGNOSIS — N186 End stage renal disease: Secondary | ICD-10-CM | POA: Diagnosis not present

## 2017-09-11 DIAGNOSIS — D509 Iron deficiency anemia, unspecified: Secondary | ICD-10-CM | POA: Diagnosis not present

## 2017-09-11 DIAGNOSIS — Z992 Dependence on renal dialysis: Secondary | ICD-10-CM | POA: Diagnosis not present

## 2017-09-11 NOTE — Progress Notes (Deleted)
Synopsis: Referred in *** for ***  Subjective:   PATIENT ID: Jesus [name] GENDER: male DOB: 1950-10-23, MRN: [id number]   HPI  No chief complaint on file.   ***  Past Medical History:  Diagnosis Date  . Anemia   . Anxiety   . Arthritis   . Cancer [facility])    bladder, ureter, Bil kidneys  . CHF (congestive heart failure) ([place])   . Current moderate episode of major depressive disorder without prior episode ([place]) 06/28/2017  . Dermatitis    scaly bump occurs every few months, pt uses cortizone cream and it goes away  . ESRD (end stage renal disease) on dialysis ([place])    Bilateral Nephrectomies- due to cancer  . GERD (gastroesophageal reflux disease) 04/07/2015  . History of blood transfusion   . Hypertension   . Lumbar back pain   . Mental disorder   . Non compliance w medication regimen    written in error  . Pleural effusion, right    s/p right thoracentesis 10/07/15 (no malignancy by cytology report)  . Shortness of breath dyspnea    Lying down gets short of breath     Family History  Problem Relation Age of Onset  . Cancer Mother   . Hypertension Father   . Atrial fibrillation Father   . Heart disease Father      Social History   Socioeconomic History  . Marital status: Widowed    Spouse name: Not on file  . Number of children: Not on file  . Years of education: Not on file  . Highest education level: Not on file  Occupational History  . Not on file  Social Needs  . Financial resource strain: Not on file  . Food insecurity:    Worry: Not on file    Inability: Not on file  . Transportation needs:    Medical: Not on file    Non-medical: Not on file  Tobacco Use  . Smoking status: Never Smoker  . Smokeless tobacco: Never Used  Substance and Sexual Activity  . Alcohol use: Not Currently    Alcohol/week: 0.0 oz    [name]: heavy drinker in the past  . Drug use: Never  . Sexual activity: Not on file  Lifestyle  . Physical activity:    Days per week:  Not on file    Minutes per session: Not on file  . Stress: Not on file  Relationships  . Social connections:    Talks on phone: Not on file    Gets together: Not on file    Attends religious service: Not on file    Active member of club or organization: Not on file    Attends meetings of clubs or organizations: Not on file    Relationship status: Not on file  . Intimate partner violence:    Fear of current or ex partner: Not on file    Emotionally abused: Not on file    Physically abused: Not on file    Forced sexual activity: Not on file  Other Topics Concern  . Not on file  Social History Narrative   Lives alone. Manages well per patient report. Does not cook much. Eats out a lot. Eats all food groups. Does not eat much fruit. Married in the past. Has one son. Used to drive a truck.      No Known Allergies   Outpatient Medications Prior to Visit  Medication Sig Dispense Refill  . ALPRAZolam Duanne Moron) 1  MG tablet Take 1 mg by mouth daily as needed for anxiety or sleep.    Marland Kitchen amLODipine (NORVASC) 5 MG tablet Take 1 tablet (5 mg total) by mouth 2 (two) times daily. 60 tablet 1  . clobetasol cream (TEMOVATE) 9.67 % Apply 1 application topically 2 (two) times daily as needed (skin irritation).     . fluticasone (FLONASE) 50 MCG/ACT nasal spray Place 1 spray into both nostrils daily.     Marland Kitchen HYDROcodone-acetaminophen (NORCO) 5-325 MG tablet Take 1 tablet by mouth every 6 (six) hours as needed for moderate pain. 10 tablet 0  . lanthanum (FOSRENOL) 500 MG chewable tablet Chew 500 mg by mouth 3 (three) times daily with meals.    . meclizine (MEDI-MECLIZINE) 25 MG tablet Take 1 tablet (25 mg total) by mouth 3 (three) times daily as needed for dizziness. 60 tablet 0  . Polyethyl Glycol-Propyl Glycol (SYSTANE OP) Place 1 drop into both eyes daily as needed (for dry, itchy eyes).    . triamcinolone cream (KENALOG) 0.1 % Apply 1 application topically 2 (two) times daily. As directed. Do not apply to  face or skin folds  6   No facility-administered medications prior to visit.     ROS    Objective:  Physical Exam   There were no vitals filed for this visit.  ***  CBC    Component Value Date/Time   WBC 12.7 (H) 08/07/2017 1050   RBC 3.55 (L) 08/07/2017 1050   HGB 11.5 (L) 08/07/2017 1050   HCT 34.9 (L) 08/07/2017 1050   PLT 261 08/07/2017 1050   MCV 98.3 08/07/2017 1050   MCH 32.4 08/07/2017 1050   MCHC 33.0 08/07/2017 1050   RDW 14.1 08/07/2017 1050   LYMPHSABS 1.5 08/07/2017 1050   MONOABS 1.0 08/07/2017 1050   EOSABS 0.4 08/07/2017 1050   BASOSABS 0.0 08/07/2017 1050     Chest imaging:  PFT:  Labs:  Path:  Echo:  Heart Catheterization:       Assessment & Plan:   No diagnosis found.  Discussion: ***    Current Outpatient Medications:  .  ALPRAZolam (XANAX) 1 MG tablet, Take 1 mg by mouth daily as needed for anxiety or sleep., Disp: , Rfl:  .  amLODipine (NORVASC) 5 MG tablet, Take 1 tablet (5 mg total) by mouth 2 (two) times daily., Disp: 60 tablet, Rfl: 1 .  clobetasol cream (TEMOVATE) 8.93 %, Apply 1 application topically 2 (two) times daily as needed (skin irritation). , Disp: , Rfl:  .  fluticasone (FLONASE) 50 MCG/ACT nasal spray, Place 1 spray into both nostrils daily. , Disp: , Rfl:  .  HYDROcodone-acetaminophen (NORCO) 5-325 MG tablet, Take 1 tablet by mouth every 6 (six) hours as needed for moderate pain., Disp: 10 tablet, Rfl: 0 .  lanthanum (FOSRENOL) 500 MG chewable tablet, Chew 500 mg by mouth 3 (three) times daily with meals., Disp: , Rfl:  .  meclizine (MEDI-MECLIZINE) 25 MG tablet, Take 1 tablet (25 mg total) by mouth 3 (three) times daily as needed for dizziness., Disp: 60 tablet, Rfl: 0 .  Polyethyl Glycol-Propyl Glycol (SYSTANE OP), Place 1 drop into both eyes daily as needed (for dry, itchy eyes)., Disp: , Rfl:  .  triamcinolone cream (KENALOG) 0.1 %, Apply 1 application topically 2 (two) times daily. As directed. Do not  apply to face or skin folds, Disp: , Rfl: 6

## 2017-09-11 NOTE — Progress Notes (Signed)
Jesus [name]    [id number]    10/28/1950  Primary Care Physician:Hagler, Apolonio Schneiders, MD  Referring Physician: Caren Macadam, [address] [place], [place] [address]  Chief complaint: Shortness of breath  HPI:  67 year old gentleman with shortness of breath which mainly happens following changing positions Is being seen for vertigo History of urothelial cancer for which she had treatment End-stage renal disease for which she is on peritoneal dialysis  Is not short of breath at rest, no shortness of breath with exertion Shortness of breath usually happens when he changes positions, getting up from a sitting position-denies having his heart racing, no chest pains with activity  The used to exercise regularly, up until a month ago was still going to the [facility] to exercise about 3 times a week-did not have any significant limitations  He does peritoneal dialysis nightly-has not had any problems with it  He had thoracentesis in October 2018-benign fluid  CT scan of the chest does reveal pleural thickening-I reviewed with the patient  Occupation: He drove trucks for many years Exposures: No exposure to significant dust or mold, was exposed to secondhand smoke as a trucker Smoking history: Never smoker  travel history: drove trucks around the country for many years Relevant family history:  Outpatient Encounter Medications as of 09/11/2017  Medication Sig  . ALPRAZolam (XANAX) 1 MG tablet Take 1 mg by mouth daily as needed for anxiety or sleep.  . clobetasol cream (TEMOVATE) 3.79 % Apply 1 application topically 2 (two) times daily as needed (skin irritation).   . fluticasone (FLONASE) 50 MCG/ACT nasal spray Place 1 spray into both nostrils daily.   Marland Kitchen HYDROcodone-acetaminophen (NORCO) 5-325 MG tablet Take 1 tablet by mouth every 6 (six) hours as needed for moderate pain.  Marland Kitchen lanthanum (FOSRENOL) 500 MG chewable tablet Chew 500 mg by mouth 3 (three) times daily with  meals.  . meclizine (MEDI-MECLIZINE) 25 MG tablet Take 1 tablet (25 mg total) by mouth 3 (three) times daily as needed for dizziness.  [name] Glycol-Propyl Glycol (SYSTANE OP) Place 1 drop into both eyes daily as needed (for dry, itchy eyes).  . triamcinolone cream (KENALOG) 0.1 % Apply 1 application topically 2 (two) times daily. As directed. Do not apply to face or skin folds  . amLODipine (NORVASC) 5 MG tablet Take 1 tablet (5 mg total) by mouth 2 (two) times daily. (Patient not taking: Reported on 09/11/2017)   No facility-administered encounter medications on file as of 09/11/2017.     Allergies as of 09/11/2017  . (No Known Allergies)    Past Medical History:  Diagnosis Date  . Anemia   . Anxiety   . Arthritis   . Cancer [facility])    bladder, ureter, Bil kidneys  . CHF (congestive heart failure) ([place])   . Current moderate episode of major depressive disorder without prior episode ([place]) 06/28/2017  . Dermatitis    scaly bump occurs every few months, pt uses cortizone cream and it goes away  . ESRD (end stage renal disease) on dialysis ([place])    Bilateral Nephrectomies- due to cancer  . GERD (gastroesophageal reflux disease) 04/07/2015  . History of blood transfusion   . Hypertension   . Lumbar back pain   . Mental disorder   . Non compliance w medication regimen    written in error  . Pleural effusion, right    s/p right thoracentesis 10/07/15 (no malignancy by cytology  report)  . Shortness of breath dyspnea    Lying down gets short of breath    Past Surgical History:  Procedure Laterality Date  . AV FISTULA PLACEMENT Right 06/05/2015   Procedure: ARTERIOVENOUS (AV) FISTULA CREATION RIGHT ARM;  Surgeon: Angelia Mould, MD;  Location: [place];  Service: Vascular;  Laterality: Right;  . AV FISTULA PLACEMENT Left 07/25/2017   Procedure: RADIOCEPHALIC  ARTERIOVENOUS FISTULA LEFT ARM;  Surgeon: Conrad Elsa, MD;  Location: [place];  Service: Vascular;  Laterality: Left;  .  BLADDER SURGERY  04-06-2015   [facility]   . CAPD INSERTION N/A 06/24/2016   Procedure: LAPAROSCOPIC INSERTION CONTINUOUS AMBULATORY PERITONEAL DIALYSIS  (CAPD) CATHETER;  Surgeon: Clovis Riley, MD;  Location: [place];  Service: General;  Laterality: N/A;  . CARPAL TUNNEL RELEASE     left hand x 2; right hand x 1; right elbow  . CERVICAL FUSION     x 2  . EYE SURGERY Bilateral    Cataract removal  . FISTULA SUPERFICIALIZATION Right 10/30/2015   Procedure: SUPERFICIALIZATION BRACHIOCEPHALIC ARTERIOVENOUS FISTULA Right Arm;  Surgeon: Angelia Mould, MD;  Location: [place];  Service: Vascular;  Laterality: Right;  . Hemocatheter    . IR GENERIC HISTORICAL Right 04/29/2016   IR THROMBECTOMY AV FISTULA W/THROMBOLYSIS/PTA INC/SHUNT/IMG RIGHT 04/29/2016 Arne Cleveland, MD [facility]  . IR GENERIC HISTORICAL  04/29/2016   IR US GUIDE VASC ACCESS RIGHT 04/29/2016 Arne Cleveland, MD [facility]  . IR THORACENTESIS ASP PLEURAL SPACE W/IMG GUIDE  12/06/2016  . KNEE ARTHROSCOPY Bilateral    bilateral  . KNEE ARTHROSCOPY WITH MEDIAL MENISECTOMY Left 02/26/2013   Procedure: KNEE ARTHROSCOPY WITH MEDIAL MENISECTOMY;  Surgeon: Garald Balding, MD;  Location: [place];  Service: Orthopedics;  Laterality: Left;  . LIGATION OF COMPETING BRANCHES OF ARTERIOVENOUS FISTULA Right 10/30/2015   Procedure: LIGATION OF COMPETING BRANCHES OF BRACHIOCEPHALIC ARTERIOVENOUS FISTULA;  Surgeon: Angelia Mould, MD;  Location: [place];  Service: Vascular;  Laterality: Right;  . NEPHRECTOMY Right September 12, 2012  . NEPHRECTOMY Left 04-06-2015   [place] Right 09/07/2015   Procedure: Fistulagram;  Surgeon: Angelia Mould, MD;  Location: [place] [facility];  Service: Cardiovascular;  Laterality: Right;  ARM  . PERIPHERAL VASCULAR CATHETERIZATION Right 09/07/2015   Procedure: Peripheral Vascular Balloon Angioplasty;  Surgeon:  Angelia Mould, MD;  Location: [place] [facility];  Service: Cardiovascular;  Laterality: Right;  upper arm venous  . PROSTATECTOMY  04-06-2015   [place] Bilateral    bilateral  . TOTAL KNEE ARTHROPLASTY  01/24/2012   Procedure: TOTAL KNEE ARTHROPLASTY;  Surgeon: Garald Balding, MD;  Location: [place];  Service: Orthopedics;  Laterality: Right;  RIGHT TOTAL KNEE REPLACEMENT    Family History  Problem Relation Age of Onset  . Cancer Mother   . Hypertension Father   . Atrial fibrillation Father   . Heart disease Father     Social History   Socioeconomic History  . Marital status: Widowed    Spouse name: Not on file  . Number of children: Not on file  . Years of education: Not on file  . Highest education level: Not on file  Occupational History  . Occupation: retired  Scientific laboratory technician  . Financial resource strain: Not on file  . Food insecurity:    Worry: Not on file  Inability: Not on file  . Transportation needs:    Medical: Not on file    Non-medical: Not on file  Tobacco Use  . Smoking status: Never Smoker  . Smokeless tobacco: Never Used  Substance and Sexual Activity  . Alcohol use: Not Currently    Alcohol/week: 0.0 oz    Comment: heavy drinker in the past  . Drug use: Never  . Sexual activity: Not on file  Lifestyle  . Physical activity:    Days per week: Not on file    Minutes per session: Not on file  . Stress: Not on file  Relationships  . Social connections:    Talks on phone: Not on file    Gets together: Not on file    Attends religious service: Not on file    Active member of club or organization: Not on file    Attends meetings of clubs or organizations: Not on file    Relationship status: Not on file  . Intimate partner violence:    Fear of current or ex partner: Not on file    Emotionally abused: Not on file    Physically abused: Not on file    Forced sexual activity: Not on file  Other  Topics Concern  . Not on file  Social History Narrative   Lives alone. Manages well per patient report. Does not cook much. Eats out a lot. Eats all food groups. Does not eat much fruit. Married in the past. Has one son. Used to drive a truck.     Review of systems: Review of Systems  Constitutional: Negative for fever and chills.  HENT: Negative.   Eyes: Negative for blurred vision.  Respiratory: as per HPI -shortness of breath only when changing positions, no shortness of breath at rest, no shortness of breath with exertion Cardiovascular: Negative for chest pain and palpitations.  Gastrointestinal: Negative for vomiting, diarrhea, blood per rectum. Genitourinary: Is on peritoneal dialysis, has history of urothelial cancer Musculoskeletal: positive for myalgias, back pain and joint pain.  Skin: Negative for itching and rash.  Neurological:vertigo  Endo/Heme/Allergies: Negative for environmental allergies.  Psychiatric/Behavioral:  All other systems reviewed and are negative.  Physical Exam:  Vitals:   09/11/17 0941  BP: 100/76  Pulse: 89  SpO2: 100%   Gen:      No acute distress HEENT:  EOMI, sclera anicteric Neck:     No masses; no thyromegaly, no pharyngeal lesion Lungs:    Clear to auscultation bilaterally; normal respiratory effort, no rales CV:         Regular rate and rhythm; no murmurs Abd:      bowel sounds; soft, non-tender; no palpable masses, no distension Ext:    No edema; adequate peripheral perfusion Skin:      Warm and dry; no rash Neuro: alert and oriented x 3 Psych: normal mood and affect  Data Reviewed: Recent blood work reviewed CT scan of the chest reviewed with patient the one most recently on the one from November 2018 CT compared with patient CT scan of the chest is significant for some pleural thickening and some nodular changes-overall improved from previous Recent blood work noted  Assessment:  1.  Shortness of breath 2.  Vertigo 3.   History of end-stage renal disease on peritoneal dialysis 4.  History of anemia 6.  History of arthritis 7.  History of hypertension 8.  Pleural thickening on CT scan of the chest-discussed this with the patient as it may progress  over time  Plan/Recommendations: 1.  Obtain PFT 2.  Follow-up on CT scan of the chest 3.  Follow-up with neurology for vertigo 4.  We will see patient back in 3 months 5.  Encouraged to call with any worsening of symptoms  Patient was reassured regarding the shortness of breath, I do not believe this is related to any underlying lung disease, he was reassured that we will obtain a pulmonary function study to assess his lung functions. Encouraged to stay active Follow-up in CT scan of the chest We will see patient back in about 3 months, if no significant symptoms at that time, may be seen as needed  Sherrilyn Rist MD [facility] 09/11/2017, 10:20 AM  CC: Caren Macadam, MD

## 2017-09-11 NOTE — Patient Instructions (Addendum)
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

## 2017-09-12 ENCOUNTER — Other Ambulatory Visit: Payer: Self-pay

## 2017-09-12 ENCOUNTER — Ambulatory Visit (HOSPITAL_COMMUNITY)
Admission: RE | Admit: 2017-09-12 | Discharge: 2017-09-12 | Disposition: A | Discharge status: Home / Self Care | Payer: Medicare Other | Source: Ambulatory Visit | Attending: Vascular Surgery | Admitting: Vascular Surgery

## 2017-09-12 ENCOUNTER — Ambulatory Visit (INDEPENDENT_AMBULATORY_CARE_PROVIDER_SITE_OTHER): Payer: Medicare Other | Admitting: Physician Assistant

## 2017-09-12 ENCOUNTER — Encounter: Payer: Self-pay | Admitting: Physician Assistant

## 2017-09-12 VITALS — BP 101/61 | HR 79 | Temp 98.5°F | Resp 20 | Ht 70.0 in | Wt 243.0 lb

## 2017-09-12 DIAGNOSIS — N2581 Secondary hyperparathyroidism of renal origin: Secondary | ICD-10-CM | POA: Diagnosis not present

## 2017-09-12 DIAGNOSIS — N186 End stage renal disease: Secondary | ICD-10-CM | POA: Insufficient documentation

## 2017-09-12 DIAGNOSIS — Z992 Dependence on renal dialysis: Secondary | ICD-10-CM | POA: Insufficient documentation

## 2017-09-12 DIAGNOSIS — Z48812 Encounter for surgical aftercare following surgery on the circulatory system: Secondary | ICD-10-CM | POA: Diagnosis not present

## 2017-09-12 DIAGNOSIS — D509 Iron deficiency anemia, unspecified: Secondary | ICD-10-CM | POA: Diagnosis not present

## 2017-09-12 NOTE — Progress Notes (Signed)
    Postoperative Access Visit   History of Present Illness   Jesus [name] is a 67 y.o. year old male who presents for postoperative follow-up for: left radiocephalic arteriovenous fistula by Dr. Bridgett Larsson (Date: 07/25/17).  The patient's wounds are healed.  The patient denies steal symptoms.  The patient is  able to complete their activities of daily living.  ESRD managed by peritoneal dialysis.  No plans from patient from Nephrology to change to HD however, they wanted an option for dialysis in the evetn PD catheter does not work in the future.  PD catheter was placed June 2018.  Physical Examination   Vitals:   09/12/17 1348  BP: 101/61  Pulse: 79  Resp: 20  Temp: 98.5 F (36.9 C)  TempSrc: Oral  SpO2: 98%  Weight: 243 lb (110.2 kg)  Height: 5\' 10"  (1.778 m)   Body mass index is 34.87 kg/m.  left arm Incision is healed, hand grip is 5/5, sensation in digits is intact, palpable thrill near wrist, bruit can be auscultated throughout L forearm; notable competing branches in L forearm   Fistula duplex demonstrates a patent left radiocephalic fistula with numerous competing branches.  Peak systolic volume decreases to less than 100 in the proximal forearm and AC fossa.  Diameter of fistula ranges from 0.  4 6 cm in distal to mid forearm and 0.51 cm in [facility] fossa  Medical Decision Making   Jesus [name] is a 67 y.o. year old male who presents s/p left radiocephalic arteriovenous fistula   L radiocephalic fistula has matured to a diameter of nearly 0.5 cm throughout its length despite a low peak systolic volume especially in the proximal forearm and AC fossa  There are also several competing branches noted in the patient's forearm  Continue PD as planned with L radiocephalic fistula acting as a back up plan  No indication to perform fistulogram and ligate competing branches at this time  If fistula is required for HD after 12 weeks post operatively, patient may require fistulogram and  ligation of branches prior to its use   Dagoberto Ligas PA-C [facility] Office: [phone]

## 2017-09-13 DIAGNOSIS — D509 Iron deficiency anemia, unspecified: Secondary | ICD-10-CM | POA: Diagnosis not present

## 2017-09-13 DIAGNOSIS — N186 End stage renal disease: Secondary | ICD-10-CM | POA: Diagnosis not present

## 2017-09-13 DIAGNOSIS — N2581 Secondary hyperparathyroidism of renal origin: Secondary | ICD-10-CM | POA: Diagnosis not present

## 2017-09-13 DIAGNOSIS — Z992 Dependence on renal dialysis: Secondary | ICD-10-CM | POA: Diagnosis not present

## 2017-09-14 ENCOUNTER — Encounter: Payer: Self-pay | Admitting: Neurology

## 2017-09-14 ENCOUNTER — Ambulatory Visit (INDEPENDENT_AMBULATORY_CARE_PROVIDER_SITE_OTHER): Payer: Medicare Other | Admitting: Neurology

## 2017-09-14 VITALS — BP 107/67 | HR 89 | Ht 70.0 in | Wt 241.0 lb

## 2017-09-14 DIAGNOSIS — R0602 Shortness of breath: Secondary | ICD-10-CM

## 2017-09-14 DIAGNOSIS — I951 Orthostatic hypotension: Secondary | ICD-10-CM

## 2017-09-14 DIAGNOSIS — Z992 Dependence on renal dialysis: Secondary | ICD-10-CM | POA: Diagnosis not present

## 2017-09-14 DIAGNOSIS — N2581 Secondary hyperparathyroidism of renal origin: Secondary | ICD-10-CM | POA: Diagnosis not present

## 2017-09-14 DIAGNOSIS — D509 Iron deficiency anemia, unspecified: Secondary | ICD-10-CM | POA: Diagnosis not present

## 2017-09-14 DIAGNOSIS — N186 End stage renal disease: Secondary | ICD-10-CM | POA: Diagnosis not present

## 2017-09-14 NOTE — Patient Instructions (Signed)
Orthostatic Hypotension Orthostatic hypotension is a sudden drop in blood pressure that happens when you quickly change positions, such as when you get up from a seated or lying position. Blood pressure is a measurement of how strongly, or weakly, your blood is pressing against the walls of your arteries. Arteries are blood vessels that carry blood from your heart throughout your body. When blood pressure is too low, you may not get enough blood to your brain or to the rest of your organs. This can cause weakness, light-headedness, rapid heartbeat, and fainting. This can last for just a few seconds or for up to a few minutes. Orthostatic hypotension is usually not a serious problem. However, if it happens frequently or gets worse, it may be a sign of something more serious. What are the causes? This condition may be caused by:  Sudden changes in posture, such as standing up quickly after you have been sitting or lying down.  Blood loss.  Loss of body fluids (dehydration).  Heart problems.  Hormone (endocrine) problems.  Pregnancy.  Severe infection.  Lack of certain nutrients.  Severe allergic reactions (anaphylaxis).  Certain medicines, such as blood pressure medicine or medicines that make the body lose excess fluids (diuretics). Sometimes, this condition can be caused by not taking medicine as directed, such as taking too much of a certain medicine.  What increases the risk? Certain factors can make you more likely to develop orthostatic hypotension, including:  Age. Risk increases as you get older.  Conditions that affect the heart or the central nervous system.  Taking certain medicines, such as blood pressure medicine or diuretics.  Being pregnant.  What are the signs or symptoms? Symptoms of this condition may include:  Weakness.  Light-headedness.  Dizziness.  Blurred vision.  Fatigue.  Rapid heartbeat.  Fainting, in severe cases.  How is this  diagnosed? This condition is diagnosed based on:  Your medical history.  Your symptoms.  Your blood pressure measurement. Your health care provider will check your blood pressure when you are: ? Lying down. ? Sitting. ? Standing.  A blood pressure reading is recorded as two numbers, such as "120 over 80" (or 120/80). The first ("top") number is called the systolic pressure. It is a measure of the pressure in your arteries as your heart beats. The second ("bottom") number is called the diastolic pressure. It is a measure of the pressure in your arteries when your heart relaxes between beats. Blood pressure is measured in a unit called mm Hg. Healthy blood pressure for adults is 120/80. If your blood pressure is below 90/60, you may be diagnosed with hypotension. Other information or tests that may be used to diagnose orthostatic hypotension include:  Your other vital signs, such as your heart rate and temperature.  Blood tests.  Tilt table test. For this test, you will be safely secured to a table that moves you from a lying position to an upright position. Your heart rhythm and blood pressure will be monitored during the test.  How is this treated? Treatment for this condition may include:  Changing your diet. This may involve eating more salt (sodium) or drinking more water.  Taking medicines to raise your blood pressure.  Changing the dosage of certain medicines you are taking that might be lowering your blood pressure.  Wearing compression stockings. These stockings help to prevent blood clots and reduce swelling in your legs.  In some cases, you may need to go to the hospital for:    Fluid replacement. This means you will receive fluids through an IV tube.  Blood replacement. This means you will receive donated blood through an IV tube (transfusion).  Treating an infection or heart problems, if this applies.  Monitoring. You may need to be monitored while medicines that you  are taking wear off.  Follow these instructions at home: Eating and drinking   Drink enough fluid to keep your urine clear or pale yellow.  Eat a healthy diet and follow instructions from your health care provider about eating or drinking restrictions. A healthy diet includes: ? Fresh fruits and vegetables. ? Whole grains. ? Lean meats. ? Low-fat dairy products.  Eat extra salt only as directed. Do not add extra salt to your diet unless your health care provider told you to do that.  Eat frequent, small meals.  Avoid standing up suddenly after eating. Medicines  Take over-the-counter and prescription medicines only as told by your health care provider. ? Follow instructions from your health care provider about changing the dosage of your current medicines, if this applies. ? Do not stop or adjust any of your medicines on your own. General instructions  Wear compression stockings as told by your health care provider.  Get up slowly from lying down or sitting positions. This gives your blood pressure a chance to adjust.  Avoid hot showers and excessive heat as directed by your health care provider.  Return to your normal activities as told by your health care provider. Ask your health care provider what activities are safe for you.  Do not use any products that contain nicotine or tobacco, such as cigarettes and e-cigarettes. If you need help quitting, ask your health care provider.  Keep all follow-up visits as told by your health care provider. This is important. Contact a health care provider if:  You vomit.  You have diarrhea.  You have a fever for more than 2-3 days.  You feel more thirsty than usual.  You feel weak and tired. Get help right away if:  You have chest pain.  You have a fast or irregular heartbeat.  You develop numbness in any part of your body.  You cannot move your arms or your legs.  You have trouble speaking.  You become sweaty or feel  lightheaded.  You faint.  You feel short of breath.  You have trouble staying awake.  You feel confused. This information is not intended to replace advice given to you by your health care provider. Make sure you discuss any questions you have with your health care provider. Document Released: 02/04/2002 Document Revised: 11/03/2015 Document Reviewed: 08/07/2015 Elsevier Interactive Patient Education  2018 [employer]  

## 2017-09-14 NOTE — Progress Notes (Signed)
[facility]    Provider:  Dr Jaynee Eagles Referring Provider: Caren Macadam, MD Primary Care Physician:  Caren Macadam, MD  CC:  Vertigo  HPI:  Jesus [name] is a 67 y.o. male here as a referral from Dr. Mannie Stabile for vertigo. PMHx ESRD on dialysis,  HTN, lumbar back pain, CHF, anxiety, anemia, Renal cell carcinoma, hyponatremia, labile blood pressure.. Patient has episodes pre-syncope, dizziness, vertigo, shortness of breath, sweating. He has had vertigo in the past but this is different and  recently worsening - June 5th he had a severe episode, during episodes he can't walk, he sweats, he may throw up, he feels dizzy like he is going to pass out, has vertigo, shortness of breath. Happens when he he is sitting and then stands up.  Will last for a few minutes if he just stands or will resolve more quickly if he sits down. This started worsening in the setting of low blood pressure and some nights when he takes his blood pressure it can be in the systolic 87O. He has not taken any blood pressure medications in 3-4 weeks due to his hypotension (he takes daily BP readings at home) and his blood pressure is decreasing even further.  No focal weakness. Denies dehydration. No inciting events. No falls. No headaches. In between episodes he is fatigued.   Reviewed notes, labs and imaging from outside physicians, which showed:  Reviewed referring physician notes.  He has been going through for fistula in the left arm as a back-up for dialysis currently using peritoneal dialysis at home, his blood pressure has not been well controlled and quite labile 110-170/60 to 90s and not been taking metoprolol for many months and takes his Norvasc depending on his blood pressure.  He is followed by neurosurgery for his back and neck.  At last appointment he reported he had 2 episodes of vertigo, he has had this in the past, he has been seen by ENT and neurology, diagnosed with vertigo, he has had studies done  in his ears, tilt table testing, scanning of his brain, he reports he was told everything was  was given a perfect given a prescription for meclizine to use as needed and it expired.  Usually bothers him once or twice a year and early 2000.  MRI brain (reviewed report) 08/17/2017 showed no acute events, no recent or remote strokes, no etiology for symptoms  Review of Systems: Patient complains of symptoms per HPI as well as the following symptoms: dizziness, vertigo, shortness of breath. Pertinent negatives and positives per HPI. All others negative.   Social History   Socioeconomic History  . Marital status: Widowed    Spouse name: Not on file  . Number of children: 1  . Years of education: Not on file  . Highest education level: Some college, no degree  Occupational History  . Occupation: retired  Scientific laboratory technician  . Financial resource strain: Not on file  . Food insecurity:    Worry: Not on file    Inability: Not on file  . Transportation needs:    Medical: Not on file    Non-medical: Not on file  Tobacco Use  . Smoking status: Never Smoker  . Smokeless tobacco: Never Used  Substance and Sexual Activity  . Alcohol use: Not Currently    Alcohol/week: 0.0 oz    Comment: heavy drinker in the past  . Drug use: Never  . Sexual activity: Not on file  Lifestyle  . Physical activity:  Days per week: Not on file    Minutes per session: Not on file  . Stress: Not on file  Relationships  . Social connections:    Talks on phone: Not on file    Gets together: Not on file    Attends religious service: Not on file    Active member of club or organization: Not on file    Attends meetings of clubs or organizations: Not on file    Relationship status: Not on file  . Intimate partner violence:    Fear of current or ex partner: Not on file    Emotionally abused: Not on file    Physically abused: Not on file    Forced sexual activity: Not on file  Other Topics Concern  . Not on file    Social History Narrative   Lives alone. Manages well per patient report. Does not cook much. Eats out a lot. Eats all food groups. Does not eat much fruit. Married in the past. Has one son. Used to drive a truck.     Family History  Problem Relation Age of Onset  . Cancer Mother   . Hypertension Father   . Atrial fibrillation Father   . Heart disease Father   . Other Father        pacemaker    Past Medical History:  Diagnosis Date  . Anemia   . Anxiety   . Arthritis   . Cancer [facility])    bladder, ureter, Bil kidneys  . CHF (congestive heart failure) ([place])   . Current moderate episode of major depressive disorder without prior episode ([place]) 06/28/2017  . Dermatitis    scaly bump occurs every few months, pt uses cortizone cream and it goes away  . ESRD (end stage renal disease) on dialysis ([place])    Bilateral Nephrectomies- due to cancer  . GERD (gastroesophageal reflux disease) 04/07/2015  . History of blood transfusion   . Hypertension   . Lumbar back pain   . Mental disorder   . Non compliance w medication regimen    written in error  . Pleural effusion, right    s/p right thoracentesis 10/07/15 (no malignancy by cytology report)  . Shortness of breath dyspnea    Lying down gets short of breath    Past Surgical History:  Procedure Laterality Date  . AV FISTULA PLACEMENT Right 06/05/2015   Procedure: ARTERIOVENOUS (AV) FISTULA CREATION RIGHT ARM;  Surgeon: Angelia Mould, MD;  Location: [place];  Service: Vascular;  Laterality: Right;  . AV FISTULA PLACEMENT Left 07/25/2017   Procedure: RADIOCEPHALIC  ARTERIOVENOUS FISTULA LEFT ARM;  Surgeon: Conrad Har[place], MD;  Location: [place];  Service: Vascular;  Laterality: Left;  . BLADDER SURGERY  04-06-2015   removal of bladder/ [facility]   . CAPD INSERTION N/A 06/24/2016   Procedure: LAPAROSCOPIC INSERTION CONTINUOUS AMBULATORY PERITONEAL DIALYSIS  (CAPD) CATHETER;  Surgeon: Clovis Riley, MD;  Location: [place];   Service: General;  Laterality: N/A;  . CARPAL TUNNEL RELEASE     left hand x 2; right hand x 1; right elbow  . CERVICAL FUSION     x 2; 2001 & 2013  . EYE SURGERY Bilateral    Cataract removal  . FISTULA SUPERFICIALIZATION Right 10/30/2015   Procedure: SUPERFICIALIZATION BRACHIOCEPHALIC ARTERIOVENOUS FISTULA Right Arm;  Surgeon: Angelia Mould, MD;  Location: [place];  Service: Vascular;  Laterality: Right;  . Hemocatheter    . IR GENERIC HISTORICAL  Right 04/29/2016   IR THROMBECTOMY AV FISTULA W/THROMBOLYSIS/PTA INC/SHUNT/IMG RIGHT 04/29/2016 Arne Cleveland, MD [facility]INTER[facility]  . IR GENERIC HISTORICAL  04/29/2016   IR US GUIDE VASC ACCESS RIGHT 04/29/2016 Arne Cleveland, MD [facility]  . IR THORACENTESIS ASP PLEURAL SPACE W/IMG GUIDE  12/06/2016  . KNEE ARTHROSCOPY Bilateral    bilateral; 2013 R; L [phone]  . KNEE ARTHROSCOPY WITH MEDIAL MENISECTOMY Left 02/26/2013   Procedure: KNEE ARTHROSCOPY WITH MEDIAL MENISECTOMY;  Surgeon: Garald Balding, MD;  Location: [place];  Service: Orthopedics;  Laterality: Left;  . LIGATION OF COMPETING BRANCHES OF ARTERIOVENOUS FISTULA Right 10/30/2015   Procedure: LIGATION OF COMPETING BRANCHES OF BRACHIOCEPHALIC ARTERIOVENOUS FISTULA;  Surgeon: Angelia Mould, MD;  Location: [place];  Service: Vascular;  Laterality: Right;  . NEPHRECTOMY Right September 12, 2012  . NEPHRECTOMY Left 04-06-2015   [place] Right 09/07/2015   Procedure: Fistulagram;  Surgeon: Angelia Mould, MD;  Location: [place] CV [facility];  Service: Cardiovascular;  Laterality: Right;  ARM  . PERIPHERAL VASCULAR CATHETERIZATION Right 09/07/2015   Procedure: Peripheral Vascular Balloon Angioplasty;  Surgeon: Angelia Mould, MD;  Location: [place] [facility];  Service: Cardiovascular;  Laterality: Right;  upper arm venous  . PROSTATECTOMY  04-06-2015   [place] Bilateral     bilateral; 2005 L  . TOTAL KNEE ARTHROPLASTY  01/24/2012   Procedure: TOTAL KNEE ARTHROPLASTY;  Surgeon: Garald Balding, MD;  Location: [place];  Service: Orthopedics;  Laterality: Right;  RIGHT TOTAL KNEE REPLACEMENT    Current Outpatient Medications  Medication Sig Dispense Refill  . ALPRAZolam (XANAX) 1 MG tablet Take 1 mg by mouth daily as needed for anxiety or sleep.    . clobetasol cream (TEMOVATE) 1.32 % Apply 1 application topically 2 (two) times daily as needed (skin irritation).     . fluticasone (FLONASE) 50 MCG/ACT nasal spray Place 1 spray into both nostrils daily.     Marland Kitchen lanthanum (FOSRENOL) 500 MG chewable tablet Chew 500 mg by mouth 3 (three) times daily with meals.    . meclizine (MEDI-MECLIZINE) 25 MG tablet Take 1 tablet (25 mg total) by mouth 3 (three) times daily as needed for dizziness. 60 tablet 0  . triamcinolone cream (KENALOG) 0.1 % Apply 1 application topically 2 (two) times daily. As directed. Do not apply to face or skin folds  6  . amLODipine (NORVASC) 5 MG tablet Take 1 tablet (5 mg total) by mouth 2 (two) times daily. (Patient not taking: Reported on 09/11/2017) 60 tablet 1  . oxyCODONE-acetaminophen (PERCOCET/ROXICET) 5-325 MG tablet Take 1 tablet by mouth 2 (two) times daily as needed.  0  . Polyethyl Glycol-Propyl Glycol (SYSTANE OP) Place 1 drop into both eyes daily as needed (for dry, itchy eyes).     No current facility-administered medications for this visit.     Allergies as of 09/14/2017  . (No Known Allergies)    Vitals: BP 107/67 (BP Location: Right Arm, Patient Position: Standing) Comment (Patient Position): @ 3 minutes  Pulse 89   Ht 5\' 10"  (1.778 m)   Wt 241 lb (109.3 kg)   BMI 34.58 kg/m  Last Weight:  Wt Readings from Last 1 Encounters:  09/14/17 241 lb (109.3 kg)   Last Height:   Ht Readings from Last 1 Encounters:  09/14/17 5\' 10"  (1.778 m)   Physical exam: Exam: Gen: NAD, conversant, well nourised, obese,  well groomed                      CV: RRR, no MRG. No Carotid Bruits. No peripheral edema, warm, nontender Eyes: Conjunctivae clear without exudates or hemorrhage  Neuro: Detailed Neurologic Exam  Speech:    Speech is normal; fluent and spontaneous with normal comprehension.  Cognition:    The patient is oriented to person, place, and time;     recent and remote memory intact;     language fluent;     normal attention, concentration,     fund of knowledge Cranial Nerves:    The pupils are equal, round, and reactive to light. Attempted fundoscopic exam could not visualize. . Visual fields are full to finger confrontation. Extraocular movements are intact. Trigeminal sensation is intact and the muscles of mastication are normal. The face is symmetric. The palate elevates in the midline. Hearing intact. Voice is normal. Shoulder shrug is normal. The tongue has normal motion without fasciculations.   Coordination:    Normal finger to nose  Gait:    Not ataxic  Motor Observation:    No asymmetry, no atrophy, and no involuntary movements noted. Tone:    Normal muscle tone.    Posture:    Posture is normal. normal erect    Strength:    Strength is V/V in the upper and lower limbs.      Sensation: intact to LT     Reflex Exam:  DTR's:    Deep tendon reflexes in the uppers are trace and in lower extremities are hyporeflexic bilaterally.   Toes:    The toes are downgoing bilaterally.   Clonus:    Clonus is absent.       Assessment/Plan:   67 y.o. male here as a referral from Dr. Mannie Stabile for vertigo. Appears to be orthostatic hypotension. PMHx ESRD on dialysis, HTN, lumbar back pain, CHF, anxiety, anemia, Renal cell carcinoma in 2014 status post chemotherapy,  labile blood pressure.  He reports 1-2 episodes of vertigo every year since 2000 and has been evaluated by neurology and ENT in the past. But since June 5th he is having different episodes, happens when he stands up from sitting or laying:  during episodes he feels like he is going to pass out, can't walk, he sweats, he throws up, he feels dizzy like he is going to pass out, shortness of breath, spinning. . Happens when he he is sitting and then stands up. Improves with sitting.   Supine 115/65 74, sitting 93/58 85, standing 87/58 p93, standing after 3 minutes 107/67 p 89  Orthostatic Hypotension: Pre-syncopal, dizzy, SOB, sweating when sitting to standing, corresponding with recent very low blood pressures recently per patient - today hypotensive and orthostatic in the office. He has been off of all his BP meds for 4 weeks because his blood pressure has been so low, says can be in the systolic 35T when he takes BP readings at home.  Called his nephrologist Dr. Fran Lowes [phone] to see if we could get an appointment soon, they will see him tomorrow morning. His heart care doctor is Dr. Kate Sable and advised follow up asap a referral and an email. Tried calling Dr. Mannie Stabile pcp and she was not in the office sent email. Asked patient to go to the ED if symptoms worsen. Will CC note to all physicians.   - MRI brain last month negative for stroke or other etiologies  Cc: Dr.  Hagler, Dr.Befekadu, Dr. Cherre Robins, MD  [facility] [address] [place], [place] [address]  Phone [phone] Fax [phone]  A total of 60 minutes was spent face-to-face with this patient. Over half this time was spent on counseling patient on the orthostatic hypotension diagnosis and different diagnostic and therapeutic options, counseling and coordination of care, risks ans benefits of management, compliance, risk factor reduction and education.

## 2017-09-15 DIAGNOSIS — Z992 Dependence on renal dialysis: Secondary | ICD-10-CM | POA: Diagnosis not present

## 2017-09-15 DIAGNOSIS — D509 Iron deficiency anemia, unspecified: Secondary | ICD-10-CM | POA: Diagnosis not present

## 2017-09-15 DIAGNOSIS — D638 Anemia in other chronic diseases classified elsewhere: Secondary | ICD-10-CM | POA: Diagnosis not present

## 2017-09-15 DIAGNOSIS — I959 Hypotension, unspecified: Secondary | ICD-10-CM | POA: Diagnosis not present

## 2017-09-15 DIAGNOSIS — N2581 Secondary hyperparathyroidism of renal origin: Secondary | ICD-10-CM | POA: Diagnosis not present

## 2017-09-15 DIAGNOSIS — N186 End stage renal disease: Secondary | ICD-10-CM | POA: Diagnosis not present

## 2017-09-15 DIAGNOSIS — R531 Weakness: Secondary | ICD-10-CM | POA: Diagnosis not present

## 2017-09-15 DIAGNOSIS — Z79899 Other long term (current) drug therapy: Secondary | ICD-10-CM | POA: Diagnosis not present

## 2017-09-15 DIAGNOSIS — I120 Hypertensive chronic kidney disease with stage 5 chronic kidney disease or end stage renal disease: Secondary | ICD-10-CM | POA: Diagnosis not present

## 2017-09-16 DIAGNOSIS — D509 Iron deficiency anemia, unspecified: Secondary | ICD-10-CM | POA: Diagnosis not present

## 2017-09-16 DIAGNOSIS — N2581 Secondary hyperparathyroidism of renal origin: Secondary | ICD-10-CM | POA: Diagnosis not present

## 2017-09-16 DIAGNOSIS — Z992 Dependence on renal dialysis: Secondary | ICD-10-CM | POA: Diagnosis not present

## 2017-09-16 DIAGNOSIS — N186 End stage renal disease: Secondary | ICD-10-CM | POA: Diagnosis not present

## 2017-09-17 DIAGNOSIS — N186 End stage renal disease: Secondary | ICD-10-CM | POA: Diagnosis not present

## 2017-09-17 DIAGNOSIS — D509 Iron deficiency anemia, unspecified: Secondary | ICD-10-CM | POA: Diagnosis not present

## 2017-09-17 DIAGNOSIS — Z992 Dependence on renal dialysis: Secondary | ICD-10-CM | POA: Diagnosis not present

## 2017-09-17 DIAGNOSIS — N2581 Secondary hyperparathyroidism of renal origin: Secondary | ICD-10-CM | POA: Diagnosis not present

## 2017-09-18 DIAGNOSIS — D509 Iron deficiency anemia, unspecified: Secondary | ICD-10-CM | POA: Diagnosis not present

## 2017-09-18 DIAGNOSIS — N2581 Secondary hyperparathyroidism of renal origin: Secondary | ICD-10-CM | POA: Diagnosis not present

## 2017-09-18 DIAGNOSIS — Z992 Dependence on renal dialysis: Secondary | ICD-10-CM | POA: Diagnosis not present

## 2017-09-18 DIAGNOSIS — N186 End stage renal disease: Secondary | ICD-10-CM | POA: Diagnosis not present

## 2017-09-19 ENCOUNTER — Telehealth: Payer: Self-pay | Admitting: Cardiovascular Disease

## 2017-09-19 DIAGNOSIS — N186 End stage renal disease: Secondary | ICD-10-CM | POA: Diagnosis not present

## 2017-09-19 DIAGNOSIS — D509 Iron deficiency anemia, unspecified: Secondary | ICD-10-CM | POA: Diagnosis not present

## 2017-09-19 DIAGNOSIS — N2581 Secondary hyperparathyroidism of renal origin: Secondary | ICD-10-CM | POA: Diagnosis not present

## 2017-09-19 DIAGNOSIS — Z992 Dependence on renal dialysis: Secondary | ICD-10-CM | POA: Diagnosis not present

## 2017-09-19 NOTE — Telephone Encounter (Signed)
Is he confusion amlodopine with meclizine? Amlodopine is his blood pressure, maximum dose is 10mg  once a daily, would never be 6 times a day. I think he needs to see Dr Hinda Lenis before seeing [place]. Most common reason for bp to drop with standing is dehydration, they may need to lift his fluid restriction or adjust parameters on his peritoneal dialysis. If that fails then we would become involved to consider medications to help keep bp up. Has he seen nephrology yet   Zandra Abts MD

## 2017-09-19 NOTE — Telephone Encounter (Signed)
Patient states he has about 5 cups of coffee in the morning. Patient does not drink alcohol. Patient drinks ginger ale throughout the day. Patient states he takes dialysis every night and is told he can only have 32 oz of fluid a day and cannot have any salt. Advised patient to switch up the coffee and ginger ale and start doing more water. Patient does not want to stop norvasc stating that he has been better since taking that but still miserable. Patient states neurologist told him he can take the norvasc up to 6 times a day which he has been doing on some days. Patient states he has thrown up but its not very often only when he has a "bad spell". Patient states its been close to a month since this has happened. No diarrhea.

## 2017-09-19 NOTE — Telephone Encounter (Signed)
Patient having issues with his BP fallen when he stands from sitting and laying down.  He has been scheduled to see Lourena Simmonds In Sept per instruction from Kidney doctor he said, but feels something needs to be sooner

## 2017-09-19 NOTE — Telephone Encounter (Signed)
Patient will see Dr. Hinda Lenis before making any changes. Patient states he does need to drink more water and realizes over the past few months he does not drink any other than a sip to take meds. Patient will call back with update

## 2017-09-19 NOTE — Telephone Encounter (Signed)
Patient states that every time he stands up he gets very lightheaded and dizzy. Patient states he tries to stand up very slowly. Patient states this has been going on for some time but has not followed up with cardiology. Patient has appt with strader on 9/10 but patient does not think he can wait that long. Patient states top number is in the 90's does not recall bottom number. Patient states at kidney dr it was 80/48. Patient reports at neurologist when lying down it 115/65  When sitting up it 93/58 And when standing it was 87/58 Standing x 3 minutes 107/67 numbers pulled from office visit with Dr. Jaynee Eagles. Will forward to provider for recommendation.

## 2017-09-19 NOTE — Telephone Encounter (Signed)
He has orthostatic hypotension as neurology noted. Is he still taking his norvasc? If so needs to stop. Please take a history of how much water, caffeinated drinks, and alcoholic drinks he has daily. One of the most common causes is dehydration, not drinking enough water/fluid. Has he had any recent nausea, vomiting, diarrhea? First line of treatment is getting of bp pills, aggressive hydration 4-6 bottles of water daily, and increased salt intake which he can start now.    Zandra Abts MD

## 2017-09-20 DIAGNOSIS — N186 End stage renal disease: Secondary | ICD-10-CM | POA: Diagnosis not present

## 2017-09-20 DIAGNOSIS — N2581 Secondary hyperparathyroidism of renal origin: Secondary | ICD-10-CM | POA: Diagnosis not present

## 2017-09-20 DIAGNOSIS — D509 Iron deficiency anemia, unspecified: Secondary | ICD-10-CM | POA: Diagnosis not present

## 2017-09-20 DIAGNOSIS — Z992 Dependence on renal dialysis: Secondary | ICD-10-CM | POA: Diagnosis not present

## 2017-09-21 DIAGNOSIS — N186 End stage renal disease: Secondary | ICD-10-CM | POA: Diagnosis not present

## 2017-09-21 DIAGNOSIS — D509 Iron deficiency anemia, unspecified: Secondary | ICD-10-CM | POA: Diagnosis not present

## 2017-09-21 DIAGNOSIS — Z992 Dependence on renal dialysis: Secondary | ICD-10-CM | POA: Diagnosis not present

## 2017-09-21 DIAGNOSIS — N2581 Secondary hyperparathyroidism of renal origin: Secondary | ICD-10-CM | POA: Diagnosis not present

## 2017-09-22 ENCOUNTER — Ambulatory Visit (INDEPENDENT_AMBULATORY_CARE_PROVIDER_SITE_OTHER): Payer: Medicare Other | Admitting: Family Medicine

## 2017-09-22 ENCOUNTER — Encounter: Payer: Self-pay | Admitting: Family Medicine

## 2017-09-22 ENCOUNTER — Other Ambulatory Visit: Payer: Self-pay

## 2017-09-22 VITALS — BP 110/62 | HR 72 | Temp 98.5°F | Resp 16 | Ht 70.0 in | Wt 246.0 lb

## 2017-09-22 DIAGNOSIS — D509 Iron deficiency anemia, unspecified: Secondary | ICD-10-CM | POA: Diagnosis not present

## 2017-09-22 DIAGNOSIS — I959 Hypotension, unspecified: Secondary | ICD-10-CM

## 2017-09-22 DIAGNOSIS — E039 Hypothyroidism, unspecified: Secondary | ICD-10-CM

## 2017-09-22 DIAGNOSIS — N2581 Secondary hyperparathyroidism of renal origin: Secondary | ICD-10-CM | POA: Diagnosis not present

## 2017-09-22 DIAGNOSIS — I953 Hypotension of hemodialysis: Secondary | ICD-10-CM | POA: Diagnosis not present

## 2017-09-22 DIAGNOSIS — Z23 Encounter for immunization: Secondary | ICD-10-CM

## 2017-09-22 DIAGNOSIS — N186 End stage renal disease: Secondary | ICD-10-CM | POA: Diagnosis not present

## 2017-09-22 DIAGNOSIS — Z992 Dependence on renal dialysis: Secondary | ICD-10-CM

## 2017-09-22 MED ORDER — FLUTICASONE PROPIONATE 50 MCG/ACT NA SUSP: 1.0000 | Freq: Every day | NASAL | 1 refills | Status: DC

## 2017-09-22 NOTE — Progress Notes (Signed)
Patient ID: Jesus [name], male    DOB: [birth year], 67 y.o.   MRN: [id number]  Chief Complaint  Patient presents with  . Dizziness    follow up    Allergies Patient has no known allergies.  Subjective:   Jesus [name] is a 67 y.o. male who presents to [facility] today.  HPI Here for follow up. Reports that was having spells of dizzy symptoms and feeling like was going to pass out when he would stand up. Was seen by neurology and told that dizzy symptoms was a result of orthostatic hypotension due to volume depletion. Was sent to his kidney doctor for evaluation and his BP was to low. Was told that he was dehydrated. Had already stopped his BP medications back in early June. Reports that was told to follow up with cardiology. Was given an appointment with Cardiology but not until September and reports that the appointment was with a PA. Patient reports that he was not happy with the appointment with the PA so he called the cardiology office and they spoke with the doctor. Reports that the cardiologist told him he needed more fluids.  Reports that kidney doctor told him that his thyroid was out of balance and put him on a pill for his thyroid. Reports that kidney doctor put him on a pill that would keep his BP up b/c when he was in the kidney doctor's office a couple weeks ago his BP was 80/48. Reports that when his pressure would get that low would feel dizzy and weak. Has not passed out. Reports that 2 weeks ago he did fall and land on his knee but did not hit head. Patient reports that prior to all of this started, he had been sick and lost about 10 pounds and then was taking off the fluids in dialysis.  Reports that since he has been on the midodrine and has been drinking more fluids, he has not felt bad or dizzy.   Reports that the midodrine that was prescribed by nephrology was supposed to be 5 mg 3 times a day.  Reports that after he talked to cardiology got confused and  was not sure about the medication so he started taking it only once a day.  He reports he did take it this morning at approximately 11 AM.  Reports that he does not feel dizzy, weak, or like he is going to pass out at this time when he stands.  Reports that he is gained about 6 pounds over the past week or so and he feels better.  He is only been taking the medication by nephrologist once a day.  He has been taking these thyroid medication and has not had any problems.  Denies any palpitations or chest pain.   Past Medical History:  Diagnosis Date  . Anemia   . Anxiety   . Arthritis   . Cancer [facility])    bladder, ureter, Bil kidneys  . CHF (congestive heart failure) ([place])   . Current moderate episode of major depressive disorder without prior episode ([place]) 06/28/2017  . Dermatitis    scaly bump occurs every few months, pt uses cortizone cream and it goes away  . ESRD (end stage renal disease) on dialysis ([place])    Bilateral Nephrectomies- due to cancer  . GERD (gastroesophageal reflux disease) 04/07/2015  . History of blood transfusion   . Hypertension   . Lumbar back pain   . Mental disorder   .  Non compliance w medication regimen    written in error  . Pleural effusion, right    s/p right thoracentesis 10/07/15 (no malignancy by cytology report)  . Shortness of breath dyspnea    Lying down gets short of breath    Past Surgical History:  Procedure Laterality Date  . AV FISTULA PLACEMENT Right 06/05/2015   Procedure: ARTERIOVENOUS (AV) FISTULA CREATION RIGHT ARM;  Surgeon: Angelia Mould, MD;  Location: [place];  Service: Vascular;  Laterality: Right;  . AV FISTULA PLACEMENT Left 07/25/2017   Procedure: RADIOCEPHALIC  ARTERIOVENOUS FISTULA LEFT ARM;  Surgeon: Conrad Salisbury, MD;  Location: [place];  Service: Vascular;  Laterality: Left;  . BLADDER SURGERY  04-06-2015   removal of bladder/ [facility]   . CAPD INSERTION N/A 06/24/2016   Procedure: LAPAROSCOPIC INSERTION  CONTINUOUS AMBULATORY PERITONEAL DIALYSIS  (CAPD) CATHETER;  Surgeon: Clovis Riley, MD;  Location: [place];  Service: General;  Laterality: N/A;  . CARPAL TUNNEL RELEASE     left hand x 2; right hand x 1; right elbow  . CERVICAL FUSION     x 2; 2001 & 2013  . EYE SURGERY Bilateral    Cataract removal  . FISTULA SUPERFICIALIZATION Right 10/30/2015   Procedure: SUPERFICIALIZATION BRACHIOCEPHALIC ARTERIOVENOUS FISTULA Right Arm;  Surgeon: Angelia Mould, MD;  Location: [place];  Service: Vascular;  Laterality: Right;  . Hemocatheter    . IR GENERIC HISTORICAL Right 04/29/2016   IR THROMBECTOMY AV FISTULA W/THROMBOLYSIS/PTA INC/SHUNT/IMG RIGHT 04/29/2016 Arne Cleveland, MD [facility]-INTERV RAD  . IR GENERIC HISTORICAL  04/29/2016   IR US GUIDE VASC ACCESS RIGHT 04/29/2016 Arne Cleveland, MD [facility]INTER[facility]  . IR THORACENTESIS ASP PLEURAL SPACE W/IMG GUIDE  12/06/2016  . KNEE ARTHROSCOPY Bilateral    bilateral; 2013 R; L [phone]  . KNEE ARTHROSCOPY WITH MEDIAL MENISECTOMY Left 02/26/2013   Procedure: KNEE ARTHROSCOPY WITH MEDIAL MENISECTOMY;  Surgeon: Garald Balding, MD;  Location: [place];  Service: Orthopedics;  Laterality: Left;  . LIGATION OF COMPETING BRANCHES OF ARTERIOVENOUS FISTULA Right 10/30/2015   Procedure: LIGATION OF COMPETING BRANCHES OF BRACHIOCEPHALIC ARTERIOVENOUS FISTULA;  Surgeon: Angelia Mould, MD;  Location: [place];  Service: Vascular;  Laterality: Right;  . NEPHRECTOMY Right September 12, 2012  . NEPHRECTOMY Left 04-06-2015   [place] Right 09/07/2015   Procedure: Fistulagram;  Surgeon: Angelia Mould, MD;  Location: [place] [facility];  Service: Cardiovascular;  Laterality: Right;  ARM  . PERIPHERAL VASCULAR CATHETERIZATION Right 09/07/2015   Procedure: Peripheral Vascular Balloon Angioplasty;  Surgeon: Angelia Mould, MD;  Location: [place] [facility];  Service: Cardiovascular;  Laterality: Right;  upper  arm venous  . PROSTATECTOMY  04-06-2015   [place] Bilateral    bilateral; 2005 L  . TOTAL KNEE ARTHROPLASTY  01/24/2012   Procedure: TOTAL KNEE ARTHROPLASTY;  Surgeon: Garald Balding, MD;  Location: [place];  Service: Orthopedics;  Laterality: Right;  RIGHT TOTAL KNEE REPLACEMENT    Family History  Problem Relation Age of Onset  . Cancer Mother   . Hypertension Father   . Atrial fibrillation Father   . Heart disease Father   . Other Father        pacemaker     Social History   Socioeconomic History  . Marital status: Widowed    Spouse name: Not on file  . Number of children:  1  . Years of education: Not on file  . Highest education level: Some college, no degree  Occupational History  . Occupation: retired  Scientific laboratory technician  . Financial resource strain: Not on file  . Food insecurity:    Worry: Not on file    Inability: Not on file  . Transportation needs:    Medical: Not on file    Non-medical: Not on file  Tobacco Use  . Smoking status: Never Smoker  . Smokeless tobacco: Never Used  Substance and Sexual Activity  . Alcohol use: Not Currently    Alcohol/week: 0.0 oz    Comment: heavy drinker in the past  . Drug use: Never  . Sexual activity: Not on file  Lifestyle  . Physical activity:    Days per week: Not on file    Minutes per session: Not on file  . Stress: Not on file  Relationships  . Social connections:    Talks on phone: Not on file    Gets together: Not on file    Attends religious service: Not on file    Active member of club or organization: Not on file    Attends meetings of clubs or organizations: Not on file    Relationship status: Not on file  Other Topics Concern  . Not on file  Social History Narrative   Lives alone. Manages well per patient report. Does not cook much. Eats out a lot. Eats all food groups. Does not eat much fruit. Married in the past. Has one son. Used to drive a truck.      Review of Systems  Constitutional: Negative for appetite change, chills, fever and unexpected weight change.  HENT: Negative for trouble swallowing and voice change.   Eyes: Negative for visual disturbance.  Respiratory: Negative for cough, chest tightness, shortness of breath and wheezing.   Cardiovascular: Negative for chest pain, palpitations and leg swelling.  Gastrointestinal: Negative for abdominal pain, diarrhea, nausea and vomiting.  Genitourinary:       Patient does not make urine since he was started on dialysis.  Skin: Negative for rash.  Neurological: Negative for dizziness, tremors, syncope, facial asymmetry, weakness and headaches.       She denies any current dizziness or weakness.  He only feels the symptoms of his blood pressure gets low.  He reports that he did take the medication this morning and he feels good.  He denies any headache, numbness, tingling, syncopal episodes.  Hematological: Negative for adenopathy. Does not bruise/bleed easily.  Psychiatric/Behavioral: Negative for sleep disturbance and suicidal ideas. The patient is not nervous/anxious.        She reports that his mood is pretty stable and pretty good.  He reports that the changes in his blood pressure of been a little bit stressful.     Objective:   BP 110/62 (BP Location: Right Arm, Patient Position: Sitting, Cuff Size: Large)   Pulse 72   Temp 98.5 F (36.9 C) (Temporal)   Resp 16   Ht 5\' 10"  (1.778 m)   Wt 246 lb 0.6 oz (111.6 kg)   BMI 35.30 kg/m   Physical Exam  Constitutional: He is oriented to person, place, and time. He appears well-developed and well-nourished.  HENT:  Head: Normocephalic and atraumatic.  Eyes: Pupils are equal, round, and reactive to light. Conjunctivae and EOM are normal.  Cardiovascular: Normal rate, regular rhythm and normal heart sounds.  Pulmonary/Chest: Effort normal and breath sounds normal.  Abdominal: Soft.  Neurological: He is alert and oriented to  person, place, and time. No cranial nerve deficit.  Skin: Skin is warm and dry.  Psychiatric: He has a normal mood and affect. His behavior is normal. Judgment and thought content normal.  Vitals reviewed.  Depression screen [facility] 2/9 09/22/2017 08/10/2017 03/07/2017 11/02/2016  Decreased Interest 2 2 3  0  Down, Depressed, Hopeless 2 1 3  0  PHQ - 2 Score 4 3 6  0  Altered sleeping 1 1 3  -  Tired, decreased energy 2 2 3  -  Change in appetite 1 1 3  -  Feeling bad or failure about yourself  0 0 3 -  Trouble concentrating 1 1 0 -  Moving slowly or fidgety/restless 0 0 0 -  Suicidal thoughts 0 0 0 -  PHQ-9 Score 9 8 18  -  Difficult doing work/chores Somewhat difficult Somewhat difficult Very difficult -    Assessment and Plan  1. Hemodialysis-associated hypotension Patient followed by nephrology with recent hypotension thought to be secondary to volume depletion.  After referral to cardiology and message from cardiology patient had confusion regarding taking the midodrine.  He is only been taking this medicine once a day.  He has increased his fluid intake and salt intake.  He is feeling better and is not symptomatic today.  Medication was reviewed with patient today and the recommendations from cardiology were discussed.  The confusion regarding the medication was sorted.  He was encouraged to take the midodrine 5 mg 3 times daily as directed by nephrology. We called his nephrologist, Dr. Earley Abide and got patient an earlier appointment to be seen next week on Tuesday, July 30 in the morning.  Patient will continue to monitor his blood pressures.  He is reading this morning at home was 110/60 which was consistent with her reading in the office today. He was counseled regarding worrisome signs and symptoms of low blood pressure and if those occur to go to the emergency department. He voiced understanding and agreement.  He will also keep his scheduled visit with cardiology. She has scheduled  lab drawl within the next week with nephrology so will not get labs today. 2. ESRD on dialysis [facility]) Keep scheduled visits as directed.  3. Immunization due Vaccination given today. - Pneumococcal polysaccharide vaccine 23-valent greater than or equal to 2yo subcutaneous/IM Office visit was 25 minutes.  Greater than 50% of office visit was spent counseling and coordinating care.  His questions were answered.  He was reassured.  His medications and recommendations from his other physicians and labs were discussed.  He will continue his thyroid medication.  We discussed that his levels do not need to be checked for 2 to 3 months.  He was counseled regarding the root and administration of his thyroid medication. Return in about 2 weeks (around 10/06/2017). Caren Macadam, MD 09/22/2017

## 2017-09-23 DIAGNOSIS — D509 Iron deficiency anemia, unspecified: Secondary | ICD-10-CM | POA: Diagnosis not present

## 2017-09-23 DIAGNOSIS — Z992 Dependence on renal dialysis: Secondary | ICD-10-CM | POA: Diagnosis not present

## 2017-09-23 DIAGNOSIS — N186 End stage renal disease: Secondary | ICD-10-CM | POA: Diagnosis not present

## 2017-09-23 DIAGNOSIS — N2581 Secondary hyperparathyroidism of renal origin: Secondary | ICD-10-CM | POA: Diagnosis not present

## 2017-09-24 DIAGNOSIS — N186 End stage renal disease: Secondary | ICD-10-CM | POA: Diagnosis not present

## 2017-09-24 DIAGNOSIS — D509 Iron deficiency anemia, unspecified: Secondary | ICD-10-CM | POA: Diagnosis not present

## 2017-09-24 DIAGNOSIS — Z992 Dependence on renal dialysis: Secondary | ICD-10-CM | POA: Diagnosis not present

## 2017-09-24 DIAGNOSIS — N2581 Secondary hyperparathyroidism of renal origin: Secondary | ICD-10-CM | POA: Diagnosis not present

## 2017-09-25 DIAGNOSIS — D509 Iron deficiency anemia, unspecified: Secondary | ICD-10-CM | POA: Diagnosis not present

## 2017-09-25 DIAGNOSIS — Z992 Dependence on renal dialysis: Secondary | ICD-10-CM | POA: Diagnosis not present

## 2017-09-25 DIAGNOSIS — N186 End stage renal disease: Secondary | ICD-10-CM | POA: Diagnosis not present

## 2017-09-25 DIAGNOSIS — N2581 Secondary hyperparathyroidism of renal origin: Secondary | ICD-10-CM | POA: Diagnosis not present

## 2017-09-26 DIAGNOSIS — N186 End stage renal disease: Secondary | ICD-10-CM | POA: Diagnosis not present

## 2017-09-26 DIAGNOSIS — D509 Iron deficiency anemia, unspecified: Secondary | ICD-10-CM | POA: Diagnosis not present

## 2017-09-26 DIAGNOSIS — N2581 Secondary hyperparathyroidism of renal origin: Secondary | ICD-10-CM | POA: Diagnosis not present

## 2017-09-26 DIAGNOSIS — Z992 Dependence on renal dialysis: Secondary | ICD-10-CM | POA: Diagnosis not present

## 2017-09-27 DIAGNOSIS — Z992 Dependence on renal dialysis: Secondary | ICD-10-CM | POA: Diagnosis not present

## 2017-09-27 DIAGNOSIS — D509 Iron deficiency anemia, unspecified: Secondary | ICD-10-CM | POA: Diagnosis not present

## 2017-09-27 DIAGNOSIS — N186 End stage renal disease: Secondary | ICD-10-CM | POA: Diagnosis not present

## 2017-09-27 DIAGNOSIS — N2581 Secondary hyperparathyroidism of renal origin: Secondary | ICD-10-CM | POA: Diagnosis not present

## 2017-09-28 DIAGNOSIS — Z992 Dependence on renal dialysis: Secondary | ICD-10-CM | POA: Diagnosis not present

## 2017-09-28 DIAGNOSIS — D509 Iron deficiency anemia, unspecified: Secondary | ICD-10-CM | POA: Diagnosis not present

## 2017-09-28 DIAGNOSIS — D631 Anemia in chronic kidney disease: Secondary | ICD-10-CM | POA: Diagnosis not present

## 2017-09-28 DIAGNOSIS — N186 End stage renal disease: Secondary | ICD-10-CM | POA: Diagnosis not present

## 2017-09-29 DIAGNOSIS — Z992 Dependence on renal dialysis: Secondary | ICD-10-CM | POA: Diagnosis not present

## 2017-09-29 DIAGNOSIS — N186 End stage renal disease: Secondary | ICD-10-CM | POA: Diagnosis not present

## 2017-09-29 DIAGNOSIS — D631 Anemia in chronic kidney disease: Secondary | ICD-10-CM | POA: Diagnosis not present

## 2017-09-29 DIAGNOSIS — D509 Iron deficiency anemia, unspecified: Secondary | ICD-10-CM | POA: Diagnosis not present

## 2017-09-30 DIAGNOSIS — D631 Anemia in chronic kidney disease: Secondary | ICD-10-CM | POA: Diagnosis not present

## 2017-09-30 DIAGNOSIS — Z992 Dependence on renal dialysis: Secondary | ICD-10-CM | POA: Diagnosis not present

## 2017-09-30 DIAGNOSIS — N186 End stage renal disease: Secondary | ICD-10-CM | POA: Diagnosis not present

## 2017-09-30 DIAGNOSIS — D509 Iron deficiency anemia, unspecified: Secondary | ICD-10-CM | POA: Diagnosis not present

## 2017-10-01 DIAGNOSIS — D509 Iron deficiency anemia, unspecified: Secondary | ICD-10-CM | POA: Diagnosis not present

## 2017-10-01 DIAGNOSIS — N186 End stage renal disease: Secondary | ICD-10-CM | POA: Diagnosis not present

## 2017-10-01 DIAGNOSIS — Z992 Dependence on renal dialysis: Secondary | ICD-10-CM | POA: Diagnosis not present

## 2017-10-01 DIAGNOSIS — D631 Anemia in chronic kidney disease: Secondary | ICD-10-CM | POA: Diagnosis not present

## 2017-10-02 DIAGNOSIS — D509 Iron deficiency anemia, unspecified: Secondary | ICD-10-CM | POA: Diagnosis not present

## 2017-10-02 DIAGNOSIS — Z992 Dependence on renal dialysis: Secondary | ICD-10-CM | POA: Diagnosis not present

## 2017-10-02 DIAGNOSIS — N186 End stage renal disease: Secondary | ICD-10-CM | POA: Diagnosis not present

## 2017-10-02 DIAGNOSIS — D631 Anemia in chronic kidney disease: Secondary | ICD-10-CM | POA: Diagnosis not present

## 2017-10-03 DIAGNOSIS — Z992 Dependence on renal dialysis: Secondary | ICD-10-CM | POA: Diagnosis not present

## 2017-10-03 DIAGNOSIS — N186 End stage renal disease: Secondary | ICD-10-CM | POA: Diagnosis not present

## 2017-10-03 DIAGNOSIS — D509 Iron deficiency anemia, unspecified: Secondary | ICD-10-CM | POA: Diagnosis not present

## 2017-10-03 DIAGNOSIS — D631 Anemia in chronic kidney disease: Secondary | ICD-10-CM | POA: Diagnosis not present

## 2017-10-04 DIAGNOSIS — D509 Iron deficiency anemia, unspecified: Secondary | ICD-10-CM | POA: Diagnosis not present

## 2017-10-04 DIAGNOSIS — D631 Anemia in chronic kidney disease: Secondary | ICD-10-CM | POA: Diagnosis not present

## 2017-10-04 DIAGNOSIS — N186 End stage renal disease: Secondary | ICD-10-CM | POA: Diagnosis not present

## 2017-10-04 DIAGNOSIS — Z992 Dependence on renal dialysis: Secondary | ICD-10-CM | POA: Diagnosis not present

## 2017-10-05 DIAGNOSIS — D509 Iron deficiency anemia, unspecified: Secondary | ICD-10-CM | POA: Diagnosis not present

## 2017-10-05 DIAGNOSIS — Z992 Dependence on renal dialysis: Secondary | ICD-10-CM | POA: Diagnosis not present

## 2017-10-05 DIAGNOSIS — D631 Anemia in chronic kidney disease: Secondary | ICD-10-CM | POA: Diagnosis not present

## 2017-10-05 DIAGNOSIS — N186 End stage renal disease: Secondary | ICD-10-CM | POA: Diagnosis not present

## 2017-10-06 DIAGNOSIS — D631 Anemia in chronic kidney disease: Secondary | ICD-10-CM | POA: Diagnosis not present

## 2017-10-06 DIAGNOSIS — N186 End stage renal disease: Secondary | ICD-10-CM | POA: Diagnosis not present

## 2017-10-06 DIAGNOSIS — Z992 Dependence on renal dialysis: Secondary | ICD-10-CM | POA: Diagnosis not present

## 2017-10-06 DIAGNOSIS — D509 Iron deficiency anemia, unspecified: Secondary | ICD-10-CM | POA: Diagnosis not present

## 2017-10-07 DIAGNOSIS — D631 Anemia in chronic kidney disease: Secondary | ICD-10-CM | POA: Diagnosis not present

## 2017-10-07 DIAGNOSIS — D509 Iron deficiency anemia, unspecified: Secondary | ICD-10-CM | POA: Diagnosis not present

## 2017-10-07 DIAGNOSIS — Z992 Dependence on renal dialysis: Secondary | ICD-10-CM | POA: Diagnosis not present

## 2017-10-07 DIAGNOSIS — N186 End stage renal disease: Secondary | ICD-10-CM | POA: Diagnosis not present

## 2017-10-08 DIAGNOSIS — D631 Anemia in chronic kidney disease: Secondary | ICD-10-CM | POA: Diagnosis not present

## 2017-10-08 DIAGNOSIS — D509 Iron deficiency anemia, unspecified: Secondary | ICD-10-CM | POA: Diagnosis not present

## 2017-10-08 DIAGNOSIS — Z992 Dependence on renal dialysis: Secondary | ICD-10-CM | POA: Diagnosis not present

## 2017-10-08 DIAGNOSIS — N186 End stage renal disease: Secondary | ICD-10-CM | POA: Diagnosis not present

## 2017-10-09 DIAGNOSIS — D509 Iron deficiency anemia, unspecified: Secondary | ICD-10-CM | POA: Diagnosis not present

## 2017-10-09 DIAGNOSIS — Z992 Dependence on renal dialysis: Secondary | ICD-10-CM | POA: Diagnosis not present

## 2017-10-09 DIAGNOSIS — D631 Anemia in chronic kidney disease: Secondary | ICD-10-CM | POA: Diagnosis not present

## 2017-10-09 DIAGNOSIS — N186 End stage renal disease: Secondary | ICD-10-CM | POA: Diagnosis not present

## 2017-10-10 DIAGNOSIS — N186 End stage renal disease: Secondary | ICD-10-CM | POA: Diagnosis not present

## 2017-10-10 DIAGNOSIS — Z992 Dependence on renal dialysis: Secondary | ICD-10-CM | POA: Diagnosis not present

## 2017-10-10 DIAGNOSIS — D631 Anemia in chronic kidney disease: Secondary | ICD-10-CM | POA: Diagnosis not present

## 2017-10-10 DIAGNOSIS — D509 Iron deficiency anemia, unspecified: Secondary | ICD-10-CM | POA: Diagnosis not present

## 2017-10-11 DIAGNOSIS — Z992 Dependence on renal dialysis: Secondary | ICD-10-CM | POA: Diagnosis not present

## 2017-10-11 DIAGNOSIS — D631 Anemia in chronic kidney disease: Secondary | ICD-10-CM | POA: Diagnosis not present

## 2017-10-11 DIAGNOSIS — N186 End stage renal disease: Secondary | ICD-10-CM | POA: Diagnosis not present

## 2017-10-11 DIAGNOSIS — D509 Iron deficiency anemia, unspecified: Secondary | ICD-10-CM | POA: Diagnosis not present

## 2017-10-12 DIAGNOSIS — D631 Anemia in chronic kidney disease: Secondary | ICD-10-CM | POA: Diagnosis not present

## 2017-10-12 DIAGNOSIS — Z992 Dependence on renal dialysis: Secondary | ICD-10-CM | POA: Diagnosis not present

## 2017-10-12 DIAGNOSIS — N186 End stage renal disease: Secondary | ICD-10-CM | POA: Diagnosis not present

## 2017-10-12 DIAGNOSIS — D509 Iron deficiency anemia, unspecified: Secondary | ICD-10-CM | POA: Diagnosis not present

## 2017-10-13 DIAGNOSIS — N186 End stage renal disease: Secondary | ICD-10-CM | POA: Diagnosis not present

## 2017-10-13 DIAGNOSIS — D509 Iron deficiency anemia, unspecified: Secondary | ICD-10-CM | POA: Diagnosis not present

## 2017-10-13 DIAGNOSIS — Z992 Dependence on renal dialysis: Secondary | ICD-10-CM | POA: Diagnosis not present

## 2017-10-13 DIAGNOSIS — D631 Anemia in chronic kidney disease: Secondary | ICD-10-CM | POA: Diagnosis not present

## 2017-10-14 DIAGNOSIS — N186 End stage renal disease: Secondary | ICD-10-CM | POA: Diagnosis not present

## 2017-10-14 DIAGNOSIS — D631 Anemia in chronic kidney disease: Secondary | ICD-10-CM | POA: Diagnosis not present

## 2017-10-14 DIAGNOSIS — D509 Iron deficiency anemia, unspecified: Secondary | ICD-10-CM | POA: Diagnosis not present

## 2017-10-14 DIAGNOSIS — Z992 Dependence on renal dialysis: Secondary | ICD-10-CM | POA: Diagnosis not present

## 2017-10-15 DIAGNOSIS — Z992 Dependence on renal dialysis: Secondary | ICD-10-CM | POA: Diagnosis not present

## 2017-10-15 DIAGNOSIS — D509 Iron deficiency anemia, unspecified: Secondary | ICD-10-CM | POA: Diagnosis not present

## 2017-10-15 DIAGNOSIS — N186 End stage renal disease: Secondary | ICD-10-CM | POA: Diagnosis not present

## 2017-10-15 DIAGNOSIS — D631 Anemia in chronic kidney disease: Secondary | ICD-10-CM | POA: Diagnosis not present

## 2017-10-16 DIAGNOSIS — Z992 Dependence on renal dialysis: Secondary | ICD-10-CM | POA: Diagnosis not present

## 2017-10-16 DIAGNOSIS — D509 Iron deficiency anemia, unspecified: Secondary | ICD-10-CM | POA: Diagnosis not present

## 2017-10-16 DIAGNOSIS — D631 Anemia in chronic kidney disease: Secondary | ICD-10-CM | POA: Diagnosis not present

## 2017-10-16 DIAGNOSIS — N186 End stage renal disease: Secondary | ICD-10-CM | POA: Diagnosis not present

## 2017-10-17 DIAGNOSIS — D631 Anemia in chronic kidney disease: Secondary | ICD-10-CM | POA: Diagnosis not present

## 2017-10-17 DIAGNOSIS — Z992 Dependence on renal dialysis: Secondary | ICD-10-CM | POA: Diagnosis not present

## 2017-10-17 DIAGNOSIS — D509 Iron deficiency anemia, unspecified: Secondary | ICD-10-CM | POA: Diagnosis not present

## 2017-10-17 DIAGNOSIS — N186 End stage renal disease: Secondary | ICD-10-CM | POA: Diagnosis not present

## 2017-10-18 DIAGNOSIS — D631 Anemia in chronic kidney disease: Secondary | ICD-10-CM | POA: Diagnosis not present

## 2017-10-18 DIAGNOSIS — D509 Iron deficiency anemia, unspecified: Secondary | ICD-10-CM | POA: Diagnosis not present

## 2017-10-18 DIAGNOSIS — N186 End stage renal disease: Secondary | ICD-10-CM | POA: Diagnosis not present

## 2017-10-18 DIAGNOSIS — Z992 Dependence on renal dialysis: Secondary | ICD-10-CM | POA: Diagnosis not present

## 2017-10-19 DIAGNOSIS — Z992 Dependence on renal dialysis: Secondary | ICD-10-CM | POA: Diagnosis not present

## 2017-10-19 DIAGNOSIS — D509 Iron deficiency anemia, unspecified: Secondary | ICD-10-CM | POA: Diagnosis not present

## 2017-10-19 DIAGNOSIS — N186 End stage renal disease: Secondary | ICD-10-CM | POA: Diagnosis not present

## 2017-10-19 DIAGNOSIS — D631 Anemia in chronic kidney disease: Secondary | ICD-10-CM | POA: Diagnosis not present

## 2017-10-20 DIAGNOSIS — D509 Iron deficiency anemia, unspecified: Secondary | ICD-10-CM | POA: Diagnosis not present

## 2017-10-20 DIAGNOSIS — D631 Anemia in chronic kidney disease: Secondary | ICD-10-CM | POA: Diagnosis not present

## 2017-10-20 DIAGNOSIS — N186 End stage renal disease: Secondary | ICD-10-CM | POA: Diagnosis not present

## 2017-10-20 DIAGNOSIS — Z992 Dependence on renal dialysis: Secondary | ICD-10-CM | POA: Diagnosis not present

## 2017-10-21 DIAGNOSIS — D631 Anemia in chronic kidney disease: Secondary | ICD-10-CM | POA: Diagnosis not present

## 2017-10-21 DIAGNOSIS — N186 End stage renal disease: Secondary | ICD-10-CM | POA: Diagnosis not present

## 2017-10-21 DIAGNOSIS — D509 Iron deficiency anemia, unspecified: Secondary | ICD-10-CM | POA: Diagnosis not present

## 2017-10-21 DIAGNOSIS — Z992 Dependence on renal dialysis: Secondary | ICD-10-CM | POA: Diagnosis not present

## 2017-10-22 DIAGNOSIS — N186 End stage renal disease: Secondary | ICD-10-CM | POA: Diagnosis not present

## 2017-10-22 DIAGNOSIS — Z992 Dependence on renal dialysis: Secondary | ICD-10-CM | POA: Diagnosis not present

## 2017-10-22 DIAGNOSIS — D631 Anemia in chronic kidney disease: Secondary | ICD-10-CM | POA: Diagnosis not present

## 2017-10-22 DIAGNOSIS — D509 Iron deficiency anemia, unspecified: Secondary | ICD-10-CM | POA: Diagnosis not present

## 2017-10-23 DIAGNOSIS — N186 End stage renal disease: Secondary | ICD-10-CM | POA: Diagnosis not present

## 2017-10-23 DIAGNOSIS — D631 Anemia in chronic kidney disease: Secondary | ICD-10-CM | POA: Diagnosis not present

## 2017-10-23 DIAGNOSIS — D509 Iron deficiency anemia, unspecified: Secondary | ICD-10-CM | POA: Diagnosis not present

## 2017-10-23 DIAGNOSIS — Z992 Dependence on renal dialysis: Secondary | ICD-10-CM | POA: Diagnosis not present

## 2017-10-24 ENCOUNTER — Ambulatory Visit: Payer: Self-pay | Admitting: Family Medicine

## 2017-10-24 ENCOUNTER — Encounter: Payer: Self-pay | Admitting: Student

## 2017-10-24 ENCOUNTER — Ambulatory Visit (INDEPENDENT_AMBULATORY_CARE_PROVIDER_SITE_OTHER): Payer: Medicare Other | Admitting: Student

## 2017-10-24 VITALS — BP 108/60 | HR 85 | Ht 70.0 in | Wt 251.0 lb

## 2017-10-24 DIAGNOSIS — N186 End stage renal disease: Secondary | ICD-10-CM | POA: Diagnosis not present

## 2017-10-24 DIAGNOSIS — D509 Iron deficiency anemia, unspecified: Secondary | ICD-10-CM | POA: Diagnosis not present

## 2017-10-24 DIAGNOSIS — I951 Orthostatic hypotension: Secondary | ICD-10-CM | POA: Diagnosis not present

## 2017-10-24 DIAGNOSIS — D631 Anemia in chronic kidney disease: Secondary | ICD-10-CM | POA: Diagnosis not present

## 2017-10-24 DIAGNOSIS — Z992 Dependence on renal dialysis: Secondary | ICD-10-CM | POA: Diagnosis not present

## 2017-10-24 NOTE — Patient Instructions (Signed)
Medication Instructions:  Your physician recommends that you continue on your current medications as directed. Please refer to the Current Medication list given to you today.   Labwork: NONE   Testing/Procedures: NONE   Follow-Up: Your physician wants you to follow-up in: 6 Months. You will receive a reminder letter in the mail two months in advance. If you don't receive a letter, please call our office to schedule the follow-up appointment.   Any Other Special Instructions Will Be Listed Below (If Applicable).     If you need a refill on your cardiac medications before your next appointment, please call your pharmacy.  Thank you for choosing [facility]!   

## 2017-10-24 NOTE — Progress Notes (Signed)
Cardiology Office Note    Date:  10/24/2017   ID:  Jesus [name], DOB 1950-03-05, MRN [id number]  PCP:  Caren Macadam, MD  Cardiologist: Kate Sable, MD    Chief Complaint  Patient presents with  . Follow-up    recent issues with orthostatic hypotension    History of Present Illness:    Jesus [name] is a 67 y.o. male with past medical history of ESRD (s/p bilateral nephrectomies), HTN, and recurrent pleural effusions who presents to the office today for follow-up.  He was last examined by Dr. Bronson Ing in 04/2016 as a new patient consult for preoperative clearance in regards to undergoing surgery for peritoneal dialysis.  An echocardiogram was obtained which showed a preserved EF of 55% with grade 1 diastolic dysfunction, mild MR, and no regional wall motion abnormalities. Was noted to have a small posterior pericardial effusion at that time with no evidence of tamponade physiology. NST showed findings consistent with a prior MI but no ischemia. EF was read as reduced to 39% but had been normal by echocardiogram.   He has been followed by CT surgery in the interim for recurrent right pleural effusions which are thought to be post pneumonic following his left nephrectomy. His last thoracentesis was in 11/2016 and CT imaging in 07/2017 showed a small dependent right pleural effusion which was decreased when compared to prior imaging.  In the interim, he called the office reporting episodes of dizziness when changing positions. Orthostatics had been checked at his Nephrologist and BP was 115/65 when lying down but dropped to 87/58 with standing. He reported having taking Norvasc up to 6 times per day but it was thought he might be confusing this with Meclizine. Increased hydration was recommended and follow-up was arranged.  In talking with the patient today, he does report improvement in his symptoms over the past month. He was started on Midodrine 5 mg 3 times daily by Nephrology  and this was titrated to 10 mg 3 times daily approximately 2 weeks ago. He has not been taking this regularly as he has noted his SBP is elevated in the 140's during the AM hours. He has noticed improvement in his dizziness with changing positions since increasing his hydration. Reports that his dry weight was increased by approximately 3 kg over the past 2 months as well and he has noted improvement with this.  He denies any recent chest pain, dyspnea on exertion, orthopnea, or PND.  Does have intermittent lower extremity edema but denies any recent symptoms.   Past Medical History:  Diagnosis Date  . Anemia   . Anxiety   . Arthritis   . Cancer [facility])    bladder, ureter, Bil kidneys  . CHF (congestive heart failure) ([place])   . Current moderate episode of major depressive disorder without prior episode ([place]) 06/28/2017  . Dermatitis    scaly bump occurs every few months, pt uses cortizone cream and it goes away  . ESRD (end stage renal disease) on dialysis ([place])    Bilateral Nephrectomies- due to cancer  . GERD (gastroesophageal reflux disease) 04/07/2015  . History of blood transfusion   . Hypertension   . Lumbar back pain   . Mental disorder   . Non compliance w medication regimen    written in error  . Pleural effusion, right    s/p right thoracentesis 10/07/15 (no malignancy by cytology report)  . Shortness of breath dyspnea    Lying down gets short of breath  Past Surgical History:  Procedure Laterality Date  . AV FISTULA PLACEMENT Right 06/05/2015   Procedure: ARTERIOVENOUS (AV) FISTULA CREATION RIGHT ARM;  Surgeon: Angelia Mould, MD;  Location: [place];  Service: Vascular;  Laterality: Right;  . AV FISTULA PLACEMENT Left 07/25/2017   Procedure: RADIOCEPHALIC  ARTERIOVENOUS FISTULA LEFT ARM;  Surgeon: Conrad Aurora, MD;  Location: [place];  Service: Vascular;  Laterality: Left;  . BLADDER SURGERY  04-06-2015   removal of bladder/ [facility]   . CAPD INSERTION  N/A 06/24/2016   Procedure: LAPAROSCOPIC INSERTION CONTINUOUS AMBULATORY PERITONEAL DIALYSIS  (CAPD) CATHETER;  Surgeon: Clovis Riley, MD;  Location: [place];  Service: General;  Laterality: N/A;  . CARPAL TUNNEL RELEASE     left hand x 2; right hand x 1; right elbow  . CERVICAL FUSION     x 2; 2001 & 2013  . EYE SURGERY Bilateral    Cataract removal  . FISTULA SUPERFICIALIZATION Right 10/30/2015   Procedure: SUPERFICIALIZATION BRACHIOCEPHALIC ARTERIOVENOUS FISTULA Right Arm;  Surgeon: Angelia Mould, MD;  Location: [place];  Service: Vascular;  Laterality: Right;  . Hemocatheter    . IR GENERIC HISTORICAL Right 04/29/2016   IR THROMBECTOMY AV FISTULA W/THROMBOLYSIS/PTA INC/SHUNT/IMG RIGHT 04/29/2016 Arne Cleveland, MD [facility]-INTER[facility]  . IR GENERIC HISTORICAL  04/29/2016   IR US GUIDE VASC ACCESS RIGHT 04/29/2016 Arne Cleveland, MD [facility]INTER[facility]  . IR THORACENTESIS ASP PLEURAL SPACE W/IMG GUIDE  12/06/2016  . KNEE ARTHROSCOPY Bilateral    bilateral; 2013 R; L [phone]  . KNEE ARTHROSCOPY WITH MEDIAL MENISECTOMY Left 02/26/2013   Procedure: KNEE ARTHROSCOPY WITH MEDIAL MENISECTOMY;  Surgeon: Garald Balding, MD;  Location: [place];  Service: Orthopedics;  Laterality: Left;  . LIGATION OF COMPETING BRANCHES OF ARTERIOVENOUS FISTULA Right 10/30/2015   Procedure: LIGATION OF COMPETING BRANCHES OF BRACHIOCEPHALIC ARTERIOVENOUS FISTULA;  Surgeon: Angelia Mould, MD;  Location: [place];  Service: Vascular;  Laterality: Right;  . NEPHRECTOMY Right September 12, 2012  . NEPHRECTOMY Left 04-06-2015   [place] Right 09/07/2015   Procedure: Fistulagram;  Surgeon: Angelia Mould, MD;  Location: [place] [facility];  Service: Cardiovascular;  Laterality: Right;  ARM  . PERIPHERAL VASCULAR CATHETERIZATION Right 09/07/2015   Procedure: Peripheral Vascular Balloon Angioplasty;  Surgeon: Angelia Mould, MD;  Location: [place] [facility];   Service: Cardiovascular;  Laterality: Right;  upper arm venous  . PROSTATECTOMY  04-06-2015   [place] Bilateral    bilateral; 2005 L  . TOTAL KNEE ARTHROPLASTY  01/24/2012   Procedure: TOTAL KNEE ARTHROPLASTY;  Surgeon: Garald Balding, MD;  Location: [place];  Service: Orthopedics;  Laterality: Right;  RIGHT TOTAL KNEE REPLACEMENT    Current Medications: Outpatient Medications Prior to Visit  Medication Sig Dispense Refill  . ALPRAZolam (XANAX) 1 MG tablet Take 1 mg by mouth daily as needed for anxiety or sleep.    . clobetasol cream (TEMOVATE) 5.17 % Apply 1 application topically 2 (two) times daily as needed (skin irritation).     Marland Kitchen lanthanum (FOSRENOL) 500 MG chewable tablet Chew 500 mg by mouth 3 (three) times daily with meals.    Marland Kitchen levothyroxine (SYNTHROID, LEVOTHROID) 75 MCG tablet Take 1 tablet by mouth as directed. 1 hour prior to other medications  3  . midodrine (PROAMATINE) 5 MG tablet Take 10 mg by mouth 3 (three) times daily.  0  . mupirocin ointment (BACTROBAN) 2 % Apply 1 application topically as directed. 1-2 times a day to any wounds  4  . oxyCODONE-acetaminophen (PERCOCET/ROXICET) 5-325 MG tablet Take 1 tablet by mouth 2 (two) times daily as needed.  0  . Polyethyl Glycol-Propyl Glycol (SYSTANE OP) Place 1 drop into both eyes daily as needed (for dry, itchy eyes).    . triamcinolone cream (KENALOG) 0.1 % Apply 1 application topically 2 (two) times daily. As directed. Do not apply to face or skin folds  6  . meclizine (MEDI-MECLIZINE) 25 MG tablet Take 1 tablet (25 mg total) by mouth 3 (three) times daily as needed for dizziness. 60 tablet 0   No facility-administered medications prior to visit.      Allergies:   Patient has no known allergies.   Social History   Socioeconomic History  . Marital status: Widowed    Spouse name: Not on file  . Number of children: 1  . Years of education: Not on file  . Highest education  level: Some college, no degree  Occupational History  . Occupation: retired  Scientific laboratory technician  . Financial resource strain: Not on file  . Food insecurity:    Worry: Not on file    Inability: Not on file  . Transportation needs:    Medical: Not on file    Non-medical: Not on file  Tobacco Use  . Smoking status: Never Smoker  . Smokeless tobacco: Never Used  Substance and Sexual Activity  . Alcohol use: Not Currently    Alcohol/week: 0.0 standard drinks    Comment: heavy drinker in the past  . Drug use: Never  . Sexual activity: Not on file  Lifestyle  . Physical activity:    Days per week: Not on file    Minutes per session: Not on file  . Stress: Not on file  Relationships  . Social connections:    Talks on phone: Not on file    Gets together: Not on file    Attends religious service: Not on file    Active member of club or organization: Not on file    Attends meetings of clubs or organizations: Not on file    Relationship status: Not on file  Other Topics Concern  . Not on file  Social History Narrative   Lives alone. Manages well per patient report. Does not cook much. Eats out a lot. Eats all food groups. Does not eat much fruit. Married in the past. Has one son. Used to drive a truck.      Family History:  The patient's family history includes Atrial fibrillation in his father; Cancer in his mother; Heart disease in his father; Hypertension in his father; Other in his father.   Review of Systems:   Please see the history of present illness.     General:  No chills, fever, night sweats or weight changes.  Cardiovascular:  No chest pain, dyspnea on exertion, edema, orthopnea, palpitations, paroxysmal nocturnal dyspnea. Dermatological: No rash, lesions/masses Respiratory: No cough, dyspnea Urologic: No hematuria, dysuria Abdominal:   No nausea, vomiting, diarrhea, bright red blood per rectum, melena, or hematemesis Neurologic:  No visual changes, wkns, changes in  mental status. Positive for dizziness.   All other systems reviewed and are otherwise negative except as noted above.   Physical Exam:    VS:  BP 108/60 (BP Location: Right Arm)   Pulse 85   Ht 5\' 10"  (1.778 m)   Wt 251  lb (113.9 kg)   SpO2 97%   BMI 36.01 kg/m    General: Well developed, well nourished Caucasian male appearing in no acute distress. Head: Normocephalic, atraumatic, sclera non-icteric, no xanthomas, nares are without discharge.  Neck: No carotid bruits. JVD not elevated.  Lungs: Respirations regular and unlabored, without wheezes or rales.  Heart: Regular rate and rhythm. No S3 or S4.  No murmur, no rubs, or gallops appreciated. Abdomen: Soft, non-tender, non-distended with normoactive bowel sounds. No hepatomegaly. No rebound/guarding. No obvious abdominal masses. Msk:  Strength and tone appear normal for age. No joint deformities or effusions. Extremities: No clubbing or cyanosis. No lower extremity edema.  Distal pedal pulses are 2+ bilaterally. Neuro: Alert and oriented X 3. Moves all extremities spontaneously. No focal deficits noted. Psych:  Responds to questions appropriately with a normal affect. Skin: No rashes or lesions noted  Wt Readings from Last 3 Encounters:  10/24/17 251 lb (113.9 kg)  09/22/17 246 lb 0.6 oz (111.6 kg)  09/14/17 241 lb (109.3 kg)     Studies/Labs Reviewed:   EKG:  EKG is not ordered today.   Recent Labs: 03/14/2017: TSH 3.97 08/07/2017: ALT 11; BUN 84; Creatinine, Ser 17.42; Hemoglobin 11.5; Platelets 261; Potassium 5.5; Sodium 132   Lipid Panel No results found for: CHOL, TRIG, HDL, CHOLHDL, VLDL, LDLCALC, LDLDIRECT  Additional studies/ records that were reviewed today include:   Echocardiogram: 05/2016 Study Conclusions  - Left ventricle: The cavity size was normal. Systolic function was   normal. The estimated ejection fraction was 55%. Wall motion was   normal; there were no regional wall motion abnormalities.  Doppler   parameters are consistent with abnormal left ventricular   relaxation (grade 1 diastolic dysfunction). Mild concentric and   moderate focal basal septal hypertrophy. - Mitral valve: There was mild regurgitation. - Left atrium: The atrium was mildly dilated. - Pericardium, extracardiac: Small posterior pericardial effusion.   Features were not consistent with tamponade physiology.  NST: 05/2016  There was no ST segment deviation noted during stress.  Defect 1: There is a medium defect of moderate severity present in the basal inferoseptal, basal inferior, basal inferolateral, mid inferoseptal, mid inferior and apical inferior location.  This is an intermediate risk study.  Findings consistent with probable prior myocardial infarction.  Nuclear stress EF: 39%.  Assessment:    1. Orthostatic hypotension   2. ESRD on dialysis [facility])      Plan:   In order of problems listed above:  1. Orthostatic Hypotension - he was previously having issues with hypotension in the setting of dehydration and excess fluid removal with peritoneal dialysis.  - BP is well-controlled at 108/60 during today's visit and he denies any associated symptoms. He has been liberalizing his fluid intake and appears euvolemic by examination today.  - He is no longer on any antihypertensive medications and was started on Midodrine. Dosing was titrated to 10mg  TID by Nephrology but he has experienced issues with hypertension this week and has been skipping doses. I recommended he continue to follow BP in the ambulatory setting and reduce dosing back to 5mg  TID if SBP remains greater than 140.   2. ESRD - currently undergoing peritoneal dialysis. Followed by Dr. Lowanda Foster but wishes to switch to [facility]. Will enter referral today.    Medication Adjustments/Labs and Tests Ordered: Current medicines are reviewed at length with the patient today.  Concerns regarding medicines are outlined above.   Medication changes, Labs and Tests ordered today  are listed in the Patient Instructions below. Patient Instructions  Medication Instructions:  Your physician recommends that you continue on your current medications as directed. Please refer to the Current Medication list given to you today.   Labwork: NONE   Testing/Procedures: NONE   Follow-Up: Your physician wants you to follow-up in: 6 Months.  You will receive a reminder letter in the mail two months in advance. If you don't receive a letter, please call our office to schedule the follow-up appointment.  Any Other Special Instructions Will Be Listed Below (If Applicable).  If you need a refill on your cardiac medications before your next appointment, please call your pharmacy. Thank you for choosing [facility]!     Signed, Erma Heritage, PA-C  10/24/2017 7:52 PM    [address] [place], [place] [address] Phone: [phone]

## 2017-10-25 DIAGNOSIS — Z992 Dependence on renal dialysis: Secondary | ICD-10-CM | POA: Diagnosis not present

## 2017-10-25 DIAGNOSIS — N186 End stage renal disease: Secondary | ICD-10-CM | POA: Diagnosis not present

## 2017-10-25 DIAGNOSIS — D631 Anemia in chronic kidney disease: Secondary | ICD-10-CM | POA: Diagnosis not present

## 2017-10-25 DIAGNOSIS — D509 Iron deficiency anemia, unspecified: Secondary | ICD-10-CM | POA: Diagnosis not present

## 2017-10-26 DIAGNOSIS — D631 Anemia in chronic kidney disease: Secondary | ICD-10-CM | POA: Diagnosis not present

## 2017-10-26 DIAGNOSIS — Z992 Dependence on renal dialysis: Secondary | ICD-10-CM | POA: Diagnosis not present

## 2017-10-26 DIAGNOSIS — D509 Iron deficiency anemia, unspecified: Secondary | ICD-10-CM | POA: Diagnosis not present

## 2017-10-26 DIAGNOSIS — N186 End stage renal disease: Secondary | ICD-10-CM | POA: Diagnosis not present

## 2017-10-27 DIAGNOSIS — D631 Anemia in chronic kidney disease: Secondary | ICD-10-CM | POA: Diagnosis not present

## 2017-10-27 DIAGNOSIS — Z992 Dependence on renal dialysis: Secondary | ICD-10-CM | POA: Diagnosis not present

## 2017-10-27 DIAGNOSIS — N186 End stage renal disease: Secondary | ICD-10-CM | POA: Diagnosis not present

## 2017-10-27 DIAGNOSIS — D509 Iron deficiency anemia, unspecified: Secondary | ICD-10-CM | POA: Diagnosis not present

## 2017-10-28 DIAGNOSIS — Z992 Dependence on renal dialysis: Secondary | ICD-10-CM | POA: Diagnosis not present

## 2017-10-28 DIAGNOSIS — D509 Iron deficiency anemia, unspecified: Secondary | ICD-10-CM | POA: Diagnosis not present

## 2017-10-28 DIAGNOSIS — D631 Anemia in chronic kidney disease: Secondary | ICD-10-CM | POA: Diagnosis not present

## 2017-10-28 DIAGNOSIS — N186 End stage renal disease: Secondary | ICD-10-CM | POA: Diagnosis not present

## 2017-10-29 DIAGNOSIS — D509 Iron deficiency anemia, unspecified: Secondary | ICD-10-CM | POA: Diagnosis not present

## 2017-10-29 DIAGNOSIS — N2581 Secondary hyperparathyroidism of renal origin: Secondary | ICD-10-CM | POA: Diagnosis not present

## 2017-10-29 DIAGNOSIS — Z992 Dependence on renal dialysis: Secondary | ICD-10-CM | POA: Diagnosis not present

## 2017-10-29 DIAGNOSIS — N186 End stage renal disease: Secondary | ICD-10-CM | POA: Diagnosis not present

## 2017-10-29 DIAGNOSIS — D631 Anemia in chronic kidney disease: Secondary | ICD-10-CM | POA: Diagnosis not present

## 2017-10-30 DIAGNOSIS — N2581 Secondary hyperparathyroidism of renal origin: Secondary | ICD-10-CM | POA: Diagnosis not present

## 2017-10-30 DIAGNOSIS — Z992 Dependence on renal dialysis: Secondary | ICD-10-CM | POA: Diagnosis not present

## 2017-10-30 DIAGNOSIS — D509 Iron deficiency anemia, unspecified: Secondary | ICD-10-CM | POA: Diagnosis not present

## 2017-10-30 DIAGNOSIS — D631 Anemia in chronic kidney disease: Secondary | ICD-10-CM | POA: Diagnosis not present

## 2017-10-30 DIAGNOSIS — N186 End stage renal disease: Secondary | ICD-10-CM | POA: Diagnosis not present

## 2017-10-31 DIAGNOSIS — D509 Iron deficiency anemia, unspecified: Secondary | ICD-10-CM | POA: Diagnosis not present

## 2017-10-31 DIAGNOSIS — N186 End stage renal disease: Secondary | ICD-10-CM | POA: Diagnosis not present

## 2017-10-31 DIAGNOSIS — N2581 Secondary hyperparathyroidism of renal origin: Secondary | ICD-10-CM | POA: Diagnosis not present

## 2017-10-31 DIAGNOSIS — D631 Anemia in chronic kidney disease: Secondary | ICD-10-CM | POA: Diagnosis not present

## 2017-10-31 DIAGNOSIS — Z992 Dependence on renal dialysis: Secondary | ICD-10-CM | POA: Diagnosis not present

## 2017-11-01 ENCOUNTER — Encounter

## 2017-11-01 ENCOUNTER — Ambulatory Visit: Payer: Medicare Other | Admitting: Neurology

## 2017-11-01 DIAGNOSIS — D631 Anemia in chronic kidney disease: Secondary | ICD-10-CM | POA: Diagnosis not present

## 2017-11-01 DIAGNOSIS — D509 Iron deficiency anemia, unspecified: Secondary | ICD-10-CM | POA: Diagnosis not present

## 2017-11-01 DIAGNOSIS — Z992 Dependence on renal dialysis: Secondary | ICD-10-CM | POA: Diagnosis not present

## 2017-11-01 DIAGNOSIS — N186 End stage renal disease: Secondary | ICD-10-CM | POA: Diagnosis not present

## 2017-11-01 DIAGNOSIS — N2581 Secondary hyperparathyroidism of renal origin: Secondary | ICD-10-CM | POA: Diagnosis not present

## 2017-11-02 DIAGNOSIS — Z992 Dependence on renal dialysis: Secondary | ICD-10-CM | POA: Diagnosis not present

## 2017-11-02 DIAGNOSIS — N2581 Secondary hyperparathyroidism of renal origin: Secondary | ICD-10-CM | POA: Diagnosis not present

## 2017-11-02 DIAGNOSIS — N186 End stage renal disease: Secondary | ICD-10-CM | POA: Diagnosis not present

## 2017-11-02 DIAGNOSIS — D631 Anemia in chronic kidney disease: Secondary | ICD-10-CM | POA: Diagnosis not present

## 2017-11-02 DIAGNOSIS — D509 Iron deficiency anemia, unspecified: Secondary | ICD-10-CM | POA: Diagnosis not present

## 2017-11-03 DIAGNOSIS — D631 Anemia in chronic kidney disease: Secondary | ICD-10-CM | POA: Diagnosis not present

## 2017-11-03 DIAGNOSIS — Z992 Dependence on renal dialysis: Secondary | ICD-10-CM | POA: Diagnosis not present

## 2017-11-03 DIAGNOSIS — N2581 Secondary hyperparathyroidism of renal origin: Secondary | ICD-10-CM | POA: Diagnosis not present

## 2017-11-03 DIAGNOSIS — D509 Iron deficiency anemia, unspecified: Secondary | ICD-10-CM | POA: Diagnosis not present

## 2017-11-03 DIAGNOSIS — N186 End stage renal disease: Secondary | ICD-10-CM | POA: Diagnosis not present

## 2017-11-04 DIAGNOSIS — Z992 Dependence on renal dialysis: Secondary | ICD-10-CM | POA: Diagnosis not present

## 2017-11-04 DIAGNOSIS — D631 Anemia in chronic kidney disease: Secondary | ICD-10-CM | POA: Diagnosis not present

## 2017-11-04 DIAGNOSIS — N186 End stage renal disease: Secondary | ICD-10-CM | POA: Diagnosis not present

## 2017-11-04 DIAGNOSIS — N2581 Secondary hyperparathyroidism of renal origin: Secondary | ICD-10-CM | POA: Diagnosis not present

## 2017-11-04 DIAGNOSIS — D509 Iron deficiency anemia, unspecified: Secondary | ICD-10-CM | POA: Diagnosis not present

## 2017-11-05 DIAGNOSIS — D509 Iron deficiency anemia, unspecified: Secondary | ICD-10-CM | POA: Diagnosis not present

## 2017-11-05 DIAGNOSIS — N2581 Secondary hyperparathyroidism of renal origin: Secondary | ICD-10-CM | POA: Diagnosis not present

## 2017-11-05 DIAGNOSIS — N186 End stage renal disease: Secondary | ICD-10-CM | POA: Diagnosis not present

## 2017-11-05 DIAGNOSIS — D631 Anemia in chronic kidney disease: Secondary | ICD-10-CM | POA: Diagnosis not present

## 2017-11-05 DIAGNOSIS — Z992 Dependence on renal dialysis: Secondary | ICD-10-CM | POA: Diagnosis not present

## 2017-11-06 DIAGNOSIS — N186 End stage renal disease: Secondary | ICD-10-CM | POA: Diagnosis not present

## 2017-11-06 DIAGNOSIS — D631 Anemia in chronic kidney disease: Secondary | ICD-10-CM | POA: Diagnosis not present

## 2017-11-06 DIAGNOSIS — D509 Iron deficiency anemia, unspecified: Secondary | ICD-10-CM | POA: Diagnosis not present

## 2017-11-06 DIAGNOSIS — N2581 Secondary hyperparathyroidism of renal origin: Secondary | ICD-10-CM | POA: Diagnosis not present

## 2017-11-06 DIAGNOSIS — Z1159 Encounter for screening for other viral diseases: Secondary | ICD-10-CM | POA: Diagnosis not present

## 2017-11-06 DIAGNOSIS — Z992 Dependence on renal dialysis: Secondary | ICD-10-CM | POA: Diagnosis not present

## 2017-11-07 ENCOUNTER — Ambulatory Visit: Payer: Self-pay | Admitting: Student

## 2017-11-07 DIAGNOSIS — D631 Anemia in chronic kidney disease: Secondary | ICD-10-CM | POA: Diagnosis not present

## 2017-11-07 DIAGNOSIS — N186 End stage renal disease: Secondary | ICD-10-CM | POA: Diagnosis not present

## 2017-11-07 DIAGNOSIS — N2581 Secondary hyperparathyroidism of renal origin: Secondary | ICD-10-CM | POA: Diagnosis not present

## 2017-11-07 DIAGNOSIS — D509 Iron deficiency anemia, unspecified: Secondary | ICD-10-CM | POA: Diagnosis not present

## 2017-11-07 DIAGNOSIS — Z992 Dependence on renal dialysis: Secondary | ICD-10-CM | POA: Diagnosis not present

## 2017-11-08 DIAGNOSIS — N186 End stage renal disease: Secondary | ICD-10-CM | POA: Diagnosis not present

## 2017-11-08 DIAGNOSIS — D509 Iron deficiency anemia, unspecified: Secondary | ICD-10-CM | POA: Diagnosis not present

## 2017-11-08 DIAGNOSIS — Z992 Dependence on renal dialysis: Secondary | ICD-10-CM | POA: Diagnosis not present

## 2017-11-08 DIAGNOSIS — N2581 Secondary hyperparathyroidism of renal origin: Secondary | ICD-10-CM | POA: Diagnosis not present

## 2017-11-08 DIAGNOSIS — D631 Anemia in chronic kidney disease: Secondary | ICD-10-CM | POA: Diagnosis not present

## 2017-11-09 DIAGNOSIS — D509 Iron deficiency anemia, unspecified: Secondary | ICD-10-CM | POA: Diagnosis not present

## 2017-11-09 DIAGNOSIS — Z992 Dependence on renal dialysis: Secondary | ICD-10-CM | POA: Diagnosis not present

## 2017-11-09 DIAGNOSIS — D631 Anemia in chronic kidney disease: Secondary | ICD-10-CM | POA: Diagnosis not present

## 2017-11-09 DIAGNOSIS — N186 End stage renal disease: Secondary | ICD-10-CM | POA: Diagnosis not present

## 2017-11-09 DIAGNOSIS — N2581 Secondary hyperparathyroidism of renal origin: Secondary | ICD-10-CM | POA: Diagnosis not present

## 2017-11-10 DIAGNOSIS — Z992 Dependence on renal dialysis: Secondary | ICD-10-CM | POA: Diagnosis not present

## 2017-11-10 DIAGNOSIS — D509 Iron deficiency anemia, unspecified: Secondary | ICD-10-CM | POA: Diagnosis not present

## 2017-11-10 DIAGNOSIS — N2581 Secondary hyperparathyroidism of renal origin: Secondary | ICD-10-CM | POA: Diagnosis not present

## 2017-11-10 DIAGNOSIS — N186 End stage renal disease: Secondary | ICD-10-CM | POA: Diagnosis not present

## 2017-11-10 DIAGNOSIS — D631 Anemia in chronic kidney disease: Secondary | ICD-10-CM | POA: Diagnosis not present

## 2017-11-11 DIAGNOSIS — D509 Iron deficiency anemia, unspecified: Secondary | ICD-10-CM | POA: Diagnosis not present

## 2017-11-11 DIAGNOSIS — Z992 Dependence on renal dialysis: Secondary | ICD-10-CM | POA: Diagnosis not present

## 2017-11-11 DIAGNOSIS — N2581 Secondary hyperparathyroidism of renal origin: Secondary | ICD-10-CM | POA: Diagnosis not present

## 2017-11-11 DIAGNOSIS — D631 Anemia in chronic kidney disease: Secondary | ICD-10-CM | POA: Diagnosis not present

## 2017-11-11 DIAGNOSIS — N186 End stage renal disease: Secondary | ICD-10-CM | POA: Diagnosis not present

## 2017-11-12 DIAGNOSIS — N186 End stage renal disease: Secondary | ICD-10-CM | POA: Diagnosis not present

## 2017-11-12 DIAGNOSIS — N2581 Secondary hyperparathyroidism of renal origin: Secondary | ICD-10-CM | POA: Diagnosis not present

## 2017-11-12 DIAGNOSIS — Z992 Dependence on renal dialysis: Secondary | ICD-10-CM | POA: Diagnosis not present

## 2017-11-12 DIAGNOSIS — D631 Anemia in chronic kidney disease: Secondary | ICD-10-CM | POA: Diagnosis not present

## 2017-11-12 DIAGNOSIS — D509 Iron deficiency anemia, unspecified: Secondary | ICD-10-CM | POA: Diagnosis not present

## 2017-11-13 DIAGNOSIS — D631 Anemia in chronic kidney disease: Secondary | ICD-10-CM | POA: Diagnosis not present

## 2017-11-13 DIAGNOSIS — D509 Iron deficiency anemia, unspecified: Secondary | ICD-10-CM | POA: Diagnosis not present

## 2017-11-13 DIAGNOSIS — N2581 Secondary hyperparathyroidism of renal origin: Secondary | ICD-10-CM | POA: Diagnosis not present

## 2017-11-13 DIAGNOSIS — N186 End stage renal disease: Secondary | ICD-10-CM | POA: Diagnosis not present

## 2017-11-13 DIAGNOSIS — Z992 Dependence on renal dialysis: Secondary | ICD-10-CM | POA: Diagnosis not present

## 2017-11-14 DIAGNOSIS — Z992 Dependence on renal dialysis: Secondary | ICD-10-CM | POA: Diagnosis not present

## 2017-11-14 DIAGNOSIS — N2581 Secondary hyperparathyroidism of renal origin: Secondary | ICD-10-CM | POA: Diagnosis not present

## 2017-11-14 DIAGNOSIS — D509 Iron deficiency anemia, unspecified: Secondary | ICD-10-CM | POA: Diagnosis not present

## 2017-11-14 DIAGNOSIS — D631 Anemia in chronic kidney disease: Secondary | ICD-10-CM | POA: Diagnosis not present

## 2017-11-14 DIAGNOSIS — N186 End stage renal disease: Secondary | ICD-10-CM | POA: Diagnosis not present

## 2017-11-15 DIAGNOSIS — N186 End stage renal disease: Secondary | ICD-10-CM | POA: Diagnosis not present

## 2017-11-15 DIAGNOSIS — D509 Iron deficiency anemia, unspecified: Secondary | ICD-10-CM | POA: Diagnosis not present

## 2017-11-15 DIAGNOSIS — D631 Anemia in chronic kidney disease: Secondary | ICD-10-CM | POA: Diagnosis not present

## 2017-11-15 DIAGNOSIS — N2581 Secondary hyperparathyroidism of renal origin: Secondary | ICD-10-CM | POA: Diagnosis not present

## 2017-11-15 DIAGNOSIS — Z992 Dependence on renal dialysis: Secondary | ICD-10-CM | POA: Diagnosis not present

## 2017-11-16 DIAGNOSIS — N2581 Secondary hyperparathyroidism of renal origin: Secondary | ICD-10-CM | POA: Diagnosis not present

## 2017-11-16 DIAGNOSIS — Z992 Dependence on renal dialysis: Secondary | ICD-10-CM | POA: Diagnosis not present

## 2017-11-16 DIAGNOSIS — N186 End stage renal disease: Secondary | ICD-10-CM | POA: Diagnosis not present

## 2017-11-16 DIAGNOSIS — D631 Anemia in chronic kidney disease: Secondary | ICD-10-CM | POA: Diagnosis not present

## 2017-11-16 DIAGNOSIS — D509 Iron deficiency anemia, unspecified: Secondary | ICD-10-CM | POA: Diagnosis not present

## 2017-11-17 DIAGNOSIS — D509 Iron deficiency anemia, unspecified: Secondary | ICD-10-CM | POA: Diagnosis not present

## 2017-11-17 DIAGNOSIS — Z992 Dependence on renal dialysis: Secondary | ICD-10-CM | POA: Diagnosis not present

## 2017-11-17 DIAGNOSIS — N186 End stage renal disease: Secondary | ICD-10-CM | POA: Diagnosis not present

## 2017-11-17 DIAGNOSIS — N2581 Secondary hyperparathyroidism of renal origin: Secondary | ICD-10-CM | POA: Diagnosis not present

## 2017-11-17 DIAGNOSIS — D631 Anemia in chronic kidney disease: Secondary | ICD-10-CM | POA: Diagnosis not present

## 2017-11-18 DIAGNOSIS — Z992 Dependence on renal dialysis: Secondary | ICD-10-CM | POA: Diagnosis not present

## 2017-11-18 DIAGNOSIS — N2581 Secondary hyperparathyroidism of renal origin: Secondary | ICD-10-CM | POA: Diagnosis not present

## 2017-11-18 DIAGNOSIS — N186 End stage renal disease: Secondary | ICD-10-CM | POA: Diagnosis not present

## 2017-11-18 DIAGNOSIS — D631 Anemia in chronic kidney disease: Secondary | ICD-10-CM | POA: Diagnosis not present

## 2017-11-18 DIAGNOSIS — D509 Iron deficiency anemia, unspecified: Secondary | ICD-10-CM | POA: Diagnosis not present

## 2017-11-19 DIAGNOSIS — D631 Anemia in chronic kidney disease: Secondary | ICD-10-CM | POA: Diagnosis not present

## 2017-11-19 DIAGNOSIS — N186 End stage renal disease: Secondary | ICD-10-CM | POA: Diagnosis not present

## 2017-11-19 DIAGNOSIS — D509 Iron deficiency anemia, unspecified: Secondary | ICD-10-CM | POA: Diagnosis not present

## 2017-11-19 DIAGNOSIS — Z992 Dependence on renal dialysis: Secondary | ICD-10-CM | POA: Diagnosis not present

## 2017-11-19 DIAGNOSIS — N2581 Secondary hyperparathyroidism of renal origin: Secondary | ICD-10-CM | POA: Diagnosis not present

## 2017-11-20 DIAGNOSIS — Z992 Dependence on renal dialysis: Secondary | ICD-10-CM | POA: Diagnosis not present

## 2017-11-20 DIAGNOSIS — D509 Iron deficiency anemia, unspecified: Secondary | ICD-10-CM | POA: Diagnosis not present

## 2017-11-20 DIAGNOSIS — D631 Anemia in chronic kidney disease: Secondary | ICD-10-CM | POA: Diagnosis not present

## 2017-11-20 DIAGNOSIS — N2581 Secondary hyperparathyroidism of renal origin: Secondary | ICD-10-CM | POA: Diagnosis not present

## 2017-11-20 DIAGNOSIS — N186 End stage renal disease: Secondary | ICD-10-CM | POA: Diagnosis not present

## 2017-11-21 DIAGNOSIS — N186 End stage renal disease: Secondary | ICD-10-CM | POA: Diagnosis not present

## 2017-11-21 DIAGNOSIS — N2581 Secondary hyperparathyroidism of renal origin: Secondary | ICD-10-CM | POA: Diagnosis not present

## 2017-11-21 DIAGNOSIS — Z992 Dependence on renal dialysis: Secondary | ICD-10-CM | POA: Diagnosis not present

## 2017-11-21 DIAGNOSIS — D631 Anemia in chronic kidney disease: Secondary | ICD-10-CM | POA: Diagnosis not present

## 2017-11-21 DIAGNOSIS — D509 Iron deficiency anemia, unspecified: Secondary | ICD-10-CM | POA: Diagnosis not present

## 2017-11-22 DIAGNOSIS — N2581 Secondary hyperparathyroidism of renal origin: Secondary | ICD-10-CM | POA: Diagnosis not present

## 2017-11-22 DIAGNOSIS — Z992 Dependence on renal dialysis: Secondary | ICD-10-CM | POA: Diagnosis not present

## 2017-11-22 DIAGNOSIS — D631 Anemia in chronic kidney disease: Secondary | ICD-10-CM | POA: Diagnosis not present

## 2017-11-22 DIAGNOSIS — N186 End stage renal disease: Secondary | ICD-10-CM | POA: Diagnosis not present

## 2017-11-22 DIAGNOSIS — D509 Iron deficiency anemia, unspecified: Secondary | ICD-10-CM | POA: Diagnosis not present

## 2017-11-23 DIAGNOSIS — Z992 Dependence on renal dialysis: Secondary | ICD-10-CM | POA: Diagnosis not present

## 2017-11-23 DIAGNOSIS — N2581 Secondary hyperparathyroidism of renal origin: Secondary | ICD-10-CM | POA: Diagnosis not present

## 2017-11-23 DIAGNOSIS — D509 Iron deficiency anemia, unspecified: Secondary | ICD-10-CM | POA: Diagnosis not present

## 2017-11-23 DIAGNOSIS — D631 Anemia in chronic kidney disease: Secondary | ICD-10-CM | POA: Diagnosis not present

## 2017-11-23 DIAGNOSIS — N186 End stage renal disease: Secondary | ICD-10-CM | POA: Diagnosis not present

## 2017-11-24 DIAGNOSIS — D631 Anemia in chronic kidney disease: Secondary | ICD-10-CM | POA: Diagnosis not present

## 2017-11-24 DIAGNOSIS — Z992 Dependence on renal dialysis: Secondary | ICD-10-CM | POA: Diagnosis not present

## 2017-11-24 DIAGNOSIS — N186 End stage renal disease: Secondary | ICD-10-CM | POA: Diagnosis not present

## 2017-11-24 DIAGNOSIS — N2581 Secondary hyperparathyroidism of renal origin: Secondary | ICD-10-CM | POA: Diagnosis not present

## 2017-11-24 DIAGNOSIS — D509 Iron deficiency anemia, unspecified: Secondary | ICD-10-CM | POA: Diagnosis not present

## 2017-11-25 DIAGNOSIS — N186 End stage renal disease: Secondary | ICD-10-CM | POA: Diagnosis not present

## 2017-11-25 DIAGNOSIS — D631 Anemia in chronic kidney disease: Secondary | ICD-10-CM | POA: Diagnosis not present

## 2017-11-25 DIAGNOSIS — N2581 Secondary hyperparathyroidism of renal origin: Secondary | ICD-10-CM | POA: Diagnosis not present

## 2017-11-25 DIAGNOSIS — D509 Iron deficiency anemia, unspecified: Secondary | ICD-10-CM | POA: Diagnosis not present

## 2017-11-25 DIAGNOSIS — Z992 Dependence on renal dialysis: Secondary | ICD-10-CM | POA: Diagnosis not present

## 2017-11-26 DIAGNOSIS — N186 End stage renal disease: Secondary | ICD-10-CM | POA: Diagnosis not present

## 2017-11-26 DIAGNOSIS — D631 Anemia in chronic kidney disease: Secondary | ICD-10-CM | POA: Diagnosis not present

## 2017-11-26 DIAGNOSIS — D509 Iron deficiency anemia, unspecified: Secondary | ICD-10-CM | POA: Diagnosis not present

## 2017-11-26 DIAGNOSIS — N2581 Secondary hyperparathyroidism of renal origin: Secondary | ICD-10-CM | POA: Diagnosis not present

## 2017-11-26 DIAGNOSIS — Z992 Dependence on renal dialysis: Secondary | ICD-10-CM | POA: Diagnosis not present

## 2017-11-27 DIAGNOSIS — D631 Anemia in chronic kidney disease: Secondary | ICD-10-CM | POA: Diagnosis not present

## 2017-11-27 DIAGNOSIS — N2581 Secondary hyperparathyroidism of renal origin: Secondary | ICD-10-CM | POA: Diagnosis not present

## 2017-11-27 DIAGNOSIS — Z992 Dependence on renal dialysis: Secondary | ICD-10-CM | POA: Diagnosis not present

## 2017-11-27 DIAGNOSIS — D509 Iron deficiency anemia, unspecified: Secondary | ICD-10-CM | POA: Diagnosis not present

## 2017-11-27 DIAGNOSIS — N186 End stage renal disease: Secondary | ICD-10-CM | POA: Diagnosis not present

## 2017-11-28 DIAGNOSIS — Z992 Dependence on renal dialysis: Secondary | ICD-10-CM | POA: Diagnosis not present

## 2017-11-28 DIAGNOSIS — Z23 Encounter for immunization: Secondary | ICD-10-CM | POA: Diagnosis not present

## 2017-11-28 DIAGNOSIS — D631 Anemia in chronic kidney disease: Secondary | ICD-10-CM | POA: Diagnosis not present

## 2017-11-28 DIAGNOSIS — N186 End stage renal disease: Secondary | ICD-10-CM | POA: Diagnosis not present

## 2017-11-28 DIAGNOSIS — N2581 Secondary hyperparathyroidism of renal origin: Secondary | ICD-10-CM | POA: Diagnosis not present

## 2017-11-28 DIAGNOSIS — D509 Iron deficiency anemia, unspecified: Secondary | ICD-10-CM | POA: Diagnosis not present

## 2017-11-29 DIAGNOSIS — N2581 Secondary hyperparathyroidism of renal origin: Secondary | ICD-10-CM | POA: Diagnosis not present

## 2017-11-29 DIAGNOSIS — Z23 Encounter for immunization: Secondary | ICD-10-CM | POA: Diagnosis not present

## 2017-11-29 DIAGNOSIS — N186 End stage renal disease: Secondary | ICD-10-CM | POA: Diagnosis not present

## 2017-11-29 DIAGNOSIS — D509 Iron deficiency anemia, unspecified: Secondary | ICD-10-CM | POA: Diagnosis not present

## 2017-11-29 DIAGNOSIS — Z992 Dependence on renal dialysis: Secondary | ICD-10-CM | POA: Diagnosis not present

## 2017-11-29 DIAGNOSIS — D631 Anemia in chronic kidney disease: Secondary | ICD-10-CM | POA: Diagnosis not present

## 2017-11-30 DIAGNOSIS — N186 End stage renal disease: Secondary | ICD-10-CM | POA: Diagnosis not present

## 2017-11-30 DIAGNOSIS — N2581 Secondary hyperparathyroidism of renal origin: Secondary | ICD-10-CM | POA: Diagnosis not present

## 2017-11-30 DIAGNOSIS — M47816 Spondylosis without myelopathy or radiculopathy, lumbar region: Secondary | ICD-10-CM | POA: Diagnosis not present

## 2017-11-30 DIAGNOSIS — Z23 Encounter for immunization: Secondary | ICD-10-CM | POA: Diagnosis not present

## 2017-11-30 DIAGNOSIS — D509 Iron deficiency anemia, unspecified: Secondary | ICD-10-CM | POA: Diagnosis not present

## 2017-11-30 DIAGNOSIS — M5020 Other cervical disc displacement, unspecified cervical region: Secondary | ICD-10-CM | POA: Diagnosis not present

## 2017-11-30 DIAGNOSIS — D631 Anemia in chronic kidney disease: Secondary | ICD-10-CM | POA: Diagnosis not present

## 2017-11-30 DIAGNOSIS — Z992 Dependence on renal dialysis: Secondary | ICD-10-CM | POA: Diagnosis not present

## 2017-12-01 DIAGNOSIS — N2581 Secondary hyperparathyroidism of renal origin: Secondary | ICD-10-CM | POA: Diagnosis not present

## 2017-12-01 DIAGNOSIS — Z23 Encounter for immunization: Secondary | ICD-10-CM | POA: Diagnosis not present

## 2017-12-01 DIAGNOSIS — D509 Iron deficiency anemia, unspecified: Secondary | ICD-10-CM | POA: Diagnosis not present

## 2017-12-01 DIAGNOSIS — D631 Anemia in chronic kidney disease: Secondary | ICD-10-CM | POA: Diagnosis not present

## 2017-12-01 DIAGNOSIS — Z992 Dependence on renal dialysis: Secondary | ICD-10-CM | POA: Diagnosis not present

## 2017-12-01 DIAGNOSIS — N186 End stage renal disease: Secondary | ICD-10-CM | POA: Diagnosis not present

## 2017-12-02 DIAGNOSIS — Z992 Dependence on renal dialysis: Secondary | ICD-10-CM | POA: Diagnosis not present

## 2017-12-02 DIAGNOSIS — D509 Iron deficiency anemia, unspecified: Secondary | ICD-10-CM | POA: Diagnosis not present

## 2017-12-02 DIAGNOSIS — D631 Anemia in chronic kidney disease: Secondary | ICD-10-CM | POA: Diagnosis not present

## 2017-12-02 DIAGNOSIS — N186 End stage renal disease: Secondary | ICD-10-CM | POA: Diagnosis not present

## 2017-12-02 DIAGNOSIS — Z23 Encounter for immunization: Secondary | ICD-10-CM | POA: Diagnosis not present

## 2017-12-02 DIAGNOSIS — N2581 Secondary hyperparathyroidism of renal origin: Secondary | ICD-10-CM | POA: Diagnosis not present

## 2017-12-03 DIAGNOSIS — N2581 Secondary hyperparathyroidism of renal origin: Secondary | ICD-10-CM | POA: Diagnosis not present

## 2017-12-03 DIAGNOSIS — D509 Iron deficiency anemia, unspecified: Secondary | ICD-10-CM | POA: Diagnosis not present

## 2017-12-03 DIAGNOSIS — Z23 Encounter for immunization: Secondary | ICD-10-CM | POA: Diagnosis not present

## 2017-12-03 DIAGNOSIS — Z992 Dependence on renal dialysis: Secondary | ICD-10-CM | POA: Diagnosis not present

## 2017-12-03 DIAGNOSIS — N186 End stage renal disease: Secondary | ICD-10-CM | POA: Diagnosis not present

## 2017-12-03 DIAGNOSIS — D631 Anemia in chronic kidney disease: Secondary | ICD-10-CM | POA: Diagnosis not present

## 2017-12-04 DIAGNOSIS — N186 End stage renal disease: Secondary | ICD-10-CM | POA: Diagnosis not present

## 2017-12-04 DIAGNOSIS — N2581 Secondary hyperparathyroidism of renal origin: Secondary | ICD-10-CM | POA: Diagnosis not present

## 2017-12-04 DIAGNOSIS — Z23 Encounter for immunization: Secondary | ICD-10-CM | POA: Diagnosis not present

## 2017-12-04 DIAGNOSIS — D631 Anemia in chronic kidney disease: Secondary | ICD-10-CM | POA: Diagnosis not present

## 2017-12-04 DIAGNOSIS — Z992 Dependence on renal dialysis: Secondary | ICD-10-CM | POA: Diagnosis not present

## 2017-12-04 DIAGNOSIS — D509 Iron deficiency anemia, unspecified: Secondary | ICD-10-CM | POA: Diagnosis not present

## 2017-12-05 DIAGNOSIS — Z992 Dependence on renal dialysis: Secondary | ICD-10-CM | POA: Diagnosis not present

## 2017-12-05 DIAGNOSIS — Z23 Encounter for immunization: Secondary | ICD-10-CM | POA: Diagnosis not present

## 2017-12-05 DIAGNOSIS — D631 Anemia in chronic kidney disease: Secondary | ICD-10-CM | POA: Diagnosis not present

## 2017-12-05 DIAGNOSIS — N2581 Secondary hyperparathyroidism of renal origin: Secondary | ICD-10-CM | POA: Diagnosis not present

## 2017-12-05 DIAGNOSIS — N186 End stage renal disease: Secondary | ICD-10-CM | POA: Diagnosis not present

## 2017-12-05 DIAGNOSIS — D509 Iron deficiency anemia, unspecified: Secondary | ICD-10-CM | POA: Diagnosis not present

## 2017-12-06 DIAGNOSIS — Z992 Dependence on renal dialysis: Secondary | ICD-10-CM | POA: Diagnosis not present

## 2017-12-06 DIAGNOSIS — D509 Iron deficiency anemia, unspecified: Secondary | ICD-10-CM | POA: Diagnosis not present

## 2017-12-06 DIAGNOSIS — Z23 Encounter for immunization: Secondary | ICD-10-CM | POA: Diagnosis not present

## 2017-12-06 DIAGNOSIS — D631 Anemia in chronic kidney disease: Secondary | ICD-10-CM | POA: Diagnosis not present

## 2017-12-06 DIAGNOSIS — N2581 Secondary hyperparathyroidism of renal origin: Secondary | ICD-10-CM | POA: Diagnosis not present

## 2017-12-06 DIAGNOSIS — N186 End stage renal disease: Secondary | ICD-10-CM | POA: Diagnosis not present

## 2017-12-07 DIAGNOSIS — N2581 Secondary hyperparathyroidism of renal origin: Secondary | ICD-10-CM | POA: Diagnosis not present

## 2017-12-07 DIAGNOSIS — D631 Anemia in chronic kidney disease: Secondary | ICD-10-CM | POA: Diagnosis not present

## 2017-12-07 DIAGNOSIS — N186 End stage renal disease: Secondary | ICD-10-CM | POA: Diagnosis not present

## 2017-12-07 DIAGNOSIS — D509 Iron deficiency anemia, unspecified: Secondary | ICD-10-CM | POA: Diagnosis not present

## 2017-12-07 DIAGNOSIS — Z23 Encounter for immunization: Secondary | ICD-10-CM | POA: Diagnosis not present

## 2017-12-07 DIAGNOSIS — Z992 Dependence on renal dialysis: Secondary | ICD-10-CM | POA: Diagnosis not present

## 2017-12-08 DIAGNOSIS — D631 Anemia in chronic kidney disease: Secondary | ICD-10-CM | POA: Diagnosis not present

## 2017-12-08 DIAGNOSIS — N186 End stage renal disease: Secondary | ICD-10-CM | POA: Diagnosis not present

## 2017-12-08 DIAGNOSIS — Z23 Encounter for immunization: Secondary | ICD-10-CM | POA: Diagnosis not present

## 2017-12-08 DIAGNOSIS — D509 Iron deficiency anemia, unspecified: Secondary | ICD-10-CM | POA: Diagnosis not present

## 2017-12-08 DIAGNOSIS — N2581 Secondary hyperparathyroidism of renal origin: Secondary | ICD-10-CM | POA: Diagnosis not present

## 2017-12-08 DIAGNOSIS — Z992 Dependence on renal dialysis: Secondary | ICD-10-CM | POA: Diagnosis not present

## 2017-12-09 DIAGNOSIS — D631 Anemia in chronic kidney disease: Secondary | ICD-10-CM | POA: Diagnosis not present

## 2017-12-09 DIAGNOSIS — Z992 Dependence on renal dialysis: Secondary | ICD-10-CM | POA: Diagnosis not present

## 2017-12-09 DIAGNOSIS — D509 Iron deficiency anemia, unspecified: Secondary | ICD-10-CM | POA: Diagnosis not present

## 2017-12-09 DIAGNOSIS — Z23 Encounter for immunization: Secondary | ICD-10-CM | POA: Diagnosis not present

## 2017-12-09 DIAGNOSIS — N2581 Secondary hyperparathyroidism of renal origin: Secondary | ICD-10-CM | POA: Diagnosis not present

## 2017-12-09 DIAGNOSIS — N186 End stage renal disease: Secondary | ICD-10-CM | POA: Diagnosis not present

## 2017-12-10 DIAGNOSIS — Z23 Encounter for immunization: Secondary | ICD-10-CM | POA: Diagnosis not present

## 2017-12-10 DIAGNOSIS — D509 Iron deficiency anemia, unspecified: Secondary | ICD-10-CM | POA: Diagnosis not present

## 2017-12-10 DIAGNOSIS — Z992 Dependence on renal dialysis: Secondary | ICD-10-CM | POA: Diagnosis not present

## 2017-12-10 DIAGNOSIS — D631 Anemia in chronic kidney disease: Secondary | ICD-10-CM | POA: Diagnosis not present

## 2017-12-10 DIAGNOSIS — N186 End stage renal disease: Secondary | ICD-10-CM | POA: Diagnosis not present

## 2017-12-10 DIAGNOSIS — N2581 Secondary hyperparathyroidism of renal origin: Secondary | ICD-10-CM | POA: Diagnosis not present

## 2017-12-11 DIAGNOSIS — D631 Anemia in chronic kidney disease: Secondary | ICD-10-CM | POA: Diagnosis not present

## 2017-12-11 DIAGNOSIS — Z992 Dependence on renal dialysis: Secondary | ICD-10-CM | POA: Diagnosis not present

## 2017-12-11 DIAGNOSIS — N186 End stage renal disease: Secondary | ICD-10-CM | POA: Diagnosis not present

## 2017-12-11 DIAGNOSIS — D509 Iron deficiency anemia, unspecified: Secondary | ICD-10-CM | POA: Diagnosis not present

## 2017-12-11 DIAGNOSIS — Z23 Encounter for immunization: Secondary | ICD-10-CM | POA: Diagnosis not present

## 2017-12-11 DIAGNOSIS — N2581 Secondary hyperparathyroidism of renal origin: Secondary | ICD-10-CM | POA: Diagnosis not present

## 2017-12-12 DIAGNOSIS — N186 End stage renal disease: Secondary | ICD-10-CM | POA: Diagnosis not present

## 2017-12-12 DIAGNOSIS — D631 Anemia in chronic kidney disease: Secondary | ICD-10-CM | POA: Diagnosis not present

## 2017-12-12 DIAGNOSIS — N2581 Secondary hyperparathyroidism of renal origin: Secondary | ICD-10-CM | POA: Diagnosis not present

## 2017-12-12 DIAGNOSIS — Z23 Encounter for immunization: Secondary | ICD-10-CM | POA: Diagnosis not present

## 2017-12-12 DIAGNOSIS — D509 Iron deficiency anemia, unspecified: Secondary | ICD-10-CM | POA: Diagnosis not present

## 2017-12-12 DIAGNOSIS — Z992 Dependence on renal dialysis: Secondary | ICD-10-CM | POA: Diagnosis not present

## 2017-12-13 DIAGNOSIS — D631 Anemia in chronic kidney disease: Secondary | ICD-10-CM | POA: Diagnosis not present

## 2017-12-13 DIAGNOSIS — N2581 Secondary hyperparathyroidism of renal origin: Secondary | ICD-10-CM | POA: Diagnosis not present

## 2017-12-13 DIAGNOSIS — N186 End stage renal disease: Secondary | ICD-10-CM | POA: Diagnosis not present

## 2017-12-13 DIAGNOSIS — D509 Iron deficiency anemia, unspecified: Secondary | ICD-10-CM | POA: Diagnosis not present

## 2017-12-13 DIAGNOSIS — Z23 Encounter for immunization: Secondary | ICD-10-CM | POA: Diagnosis not present

## 2017-12-13 DIAGNOSIS — Z992 Dependence on renal dialysis: Secondary | ICD-10-CM | POA: Diagnosis not present

## 2017-12-14 DIAGNOSIS — N186 End stage renal disease: Secondary | ICD-10-CM | POA: Diagnosis not present

## 2017-12-14 DIAGNOSIS — N2581 Secondary hyperparathyroidism of renal origin: Secondary | ICD-10-CM | POA: Diagnosis not present

## 2017-12-14 DIAGNOSIS — Z23 Encounter for immunization: Secondary | ICD-10-CM | POA: Diagnosis not present

## 2017-12-14 DIAGNOSIS — D509 Iron deficiency anemia, unspecified: Secondary | ICD-10-CM | POA: Diagnosis not present

## 2017-12-14 DIAGNOSIS — Z992 Dependence on renal dialysis: Secondary | ICD-10-CM | POA: Diagnosis not present

## 2017-12-14 DIAGNOSIS — D631 Anemia in chronic kidney disease: Secondary | ICD-10-CM | POA: Diagnosis not present

## 2017-12-15 DIAGNOSIS — D509 Iron deficiency anemia, unspecified: Secondary | ICD-10-CM | POA: Diagnosis not present

## 2017-12-15 DIAGNOSIS — Z992 Dependence on renal dialysis: Secondary | ICD-10-CM | POA: Diagnosis not present

## 2017-12-15 DIAGNOSIS — N186 End stage renal disease: Secondary | ICD-10-CM | POA: Diagnosis not present

## 2017-12-15 DIAGNOSIS — Z23 Encounter for immunization: Secondary | ICD-10-CM | POA: Diagnosis not present

## 2017-12-15 DIAGNOSIS — N2581 Secondary hyperparathyroidism of renal origin: Secondary | ICD-10-CM | POA: Diagnosis not present

## 2017-12-15 DIAGNOSIS — D631 Anemia in chronic kidney disease: Secondary | ICD-10-CM | POA: Diagnosis not present

## 2017-12-16 DIAGNOSIS — N186 End stage renal disease: Secondary | ICD-10-CM | POA: Diagnosis not present

## 2017-12-16 DIAGNOSIS — Z23 Encounter for immunization: Secondary | ICD-10-CM | POA: Diagnosis not present

## 2017-12-16 DIAGNOSIS — D631 Anemia in chronic kidney disease: Secondary | ICD-10-CM | POA: Diagnosis not present

## 2017-12-16 DIAGNOSIS — D509 Iron deficiency anemia, unspecified: Secondary | ICD-10-CM | POA: Diagnosis not present

## 2017-12-16 DIAGNOSIS — N2581 Secondary hyperparathyroidism of renal origin: Secondary | ICD-10-CM | POA: Diagnosis not present

## 2017-12-16 DIAGNOSIS — Z992 Dependence on renal dialysis: Secondary | ICD-10-CM | POA: Diagnosis not present

## 2017-12-17 DIAGNOSIS — D631 Anemia in chronic kidney disease: Secondary | ICD-10-CM | POA: Diagnosis not present

## 2017-12-17 DIAGNOSIS — Z23 Encounter for immunization: Secondary | ICD-10-CM | POA: Diagnosis not present

## 2017-12-17 DIAGNOSIS — D509 Iron deficiency anemia, unspecified: Secondary | ICD-10-CM | POA: Diagnosis not present

## 2017-12-17 DIAGNOSIS — Z992 Dependence on renal dialysis: Secondary | ICD-10-CM | POA: Diagnosis not present

## 2017-12-17 DIAGNOSIS — N186 End stage renal disease: Secondary | ICD-10-CM | POA: Diagnosis not present

## 2017-12-17 DIAGNOSIS — N2581 Secondary hyperparathyroidism of renal origin: Secondary | ICD-10-CM | POA: Diagnosis not present

## 2017-12-18 DIAGNOSIS — Z23 Encounter for immunization: Secondary | ICD-10-CM | POA: Diagnosis not present

## 2017-12-18 DIAGNOSIS — D509 Iron deficiency anemia, unspecified: Secondary | ICD-10-CM | POA: Diagnosis not present

## 2017-12-18 DIAGNOSIS — Z992 Dependence on renal dialysis: Secondary | ICD-10-CM | POA: Diagnosis not present

## 2017-12-18 DIAGNOSIS — N186 End stage renal disease: Secondary | ICD-10-CM | POA: Diagnosis not present

## 2017-12-18 DIAGNOSIS — N2581 Secondary hyperparathyroidism of renal origin: Secondary | ICD-10-CM | POA: Diagnosis not present

## 2017-12-18 DIAGNOSIS — D631 Anemia in chronic kidney disease: Secondary | ICD-10-CM | POA: Diagnosis not present

## 2017-12-19 DIAGNOSIS — D631 Anemia in chronic kidney disease: Secondary | ICD-10-CM | POA: Diagnosis not present

## 2017-12-19 DIAGNOSIS — N186 End stage renal disease: Secondary | ICD-10-CM | POA: Diagnosis not present

## 2017-12-19 DIAGNOSIS — Z992 Dependence on renal dialysis: Secondary | ICD-10-CM | POA: Diagnosis not present

## 2017-12-19 DIAGNOSIS — D509 Iron deficiency anemia, unspecified: Secondary | ICD-10-CM | POA: Diagnosis not present

## 2017-12-19 DIAGNOSIS — N2581 Secondary hyperparathyroidism of renal origin: Secondary | ICD-10-CM | POA: Diagnosis not present

## 2017-12-19 DIAGNOSIS — Z23 Encounter for immunization: Secondary | ICD-10-CM | POA: Diagnosis not present

## 2017-12-20 DIAGNOSIS — D631 Anemia in chronic kidney disease: Secondary | ICD-10-CM | POA: Diagnosis not present

## 2017-12-20 DIAGNOSIS — N2581 Secondary hyperparathyroidism of renal origin: Secondary | ICD-10-CM | POA: Diagnosis not present

## 2017-12-20 DIAGNOSIS — D509 Iron deficiency anemia, unspecified: Secondary | ICD-10-CM | POA: Diagnosis not present

## 2017-12-20 DIAGNOSIS — Z23 Encounter for immunization: Secondary | ICD-10-CM | POA: Diagnosis not present

## 2017-12-20 DIAGNOSIS — N186 End stage renal disease: Secondary | ICD-10-CM | POA: Diagnosis not present

## 2017-12-20 DIAGNOSIS — Z992 Dependence on renal dialysis: Secondary | ICD-10-CM | POA: Diagnosis not present

## 2017-12-21 DIAGNOSIS — Z23 Encounter for immunization: Secondary | ICD-10-CM | POA: Diagnosis not present

## 2017-12-21 DIAGNOSIS — N2581 Secondary hyperparathyroidism of renal origin: Secondary | ICD-10-CM | POA: Diagnosis not present

## 2017-12-21 DIAGNOSIS — D631 Anemia in chronic kidney disease: Secondary | ICD-10-CM | POA: Diagnosis not present

## 2017-12-21 DIAGNOSIS — D509 Iron deficiency anemia, unspecified: Secondary | ICD-10-CM | POA: Diagnosis not present

## 2017-12-21 DIAGNOSIS — N186 End stage renal disease: Secondary | ICD-10-CM | POA: Diagnosis not present

## 2017-12-21 DIAGNOSIS — Z992 Dependence on renal dialysis: Secondary | ICD-10-CM | POA: Diagnosis not present

## 2017-12-22 DIAGNOSIS — Z992 Dependence on renal dialysis: Secondary | ICD-10-CM | POA: Diagnosis not present

## 2017-12-22 DIAGNOSIS — Z23 Encounter for immunization: Secondary | ICD-10-CM | POA: Diagnosis not present

## 2017-12-22 DIAGNOSIS — D509 Iron deficiency anemia, unspecified: Secondary | ICD-10-CM | POA: Diagnosis not present

## 2017-12-22 DIAGNOSIS — N2581 Secondary hyperparathyroidism of renal origin: Secondary | ICD-10-CM | POA: Diagnosis not present

## 2017-12-22 DIAGNOSIS — N186 End stage renal disease: Secondary | ICD-10-CM | POA: Diagnosis not present

## 2017-12-22 DIAGNOSIS — D631 Anemia in chronic kidney disease: Secondary | ICD-10-CM | POA: Diagnosis not present

## 2017-12-23 DIAGNOSIS — Z23 Encounter for immunization: Secondary | ICD-10-CM | POA: Diagnosis not present

## 2017-12-23 DIAGNOSIS — N186 End stage renal disease: Secondary | ICD-10-CM | POA: Diagnosis not present

## 2017-12-23 DIAGNOSIS — D631 Anemia in chronic kidney disease: Secondary | ICD-10-CM | POA: Diagnosis not present

## 2017-12-23 DIAGNOSIS — N2581 Secondary hyperparathyroidism of renal origin: Secondary | ICD-10-CM | POA: Diagnosis not present

## 2017-12-23 DIAGNOSIS — Z992 Dependence on renal dialysis: Secondary | ICD-10-CM | POA: Diagnosis not present

## 2017-12-23 DIAGNOSIS — D509 Iron deficiency anemia, unspecified: Secondary | ICD-10-CM | POA: Diagnosis not present

## 2017-12-24 DIAGNOSIS — N2581 Secondary hyperparathyroidism of renal origin: Secondary | ICD-10-CM | POA: Diagnosis not present

## 2017-12-24 DIAGNOSIS — D631 Anemia in chronic kidney disease: Secondary | ICD-10-CM | POA: Diagnosis not present

## 2017-12-24 DIAGNOSIS — N186 End stage renal disease: Secondary | ICD-10-CM | POA: Diagnosis not present

## 2017-12-24 DIAGNOSIS — D509 Iron deficiency anemia, unspecified: Secondary | ICD-10-CM | POA: Diagnosis not present

## 2017-12-24 DIAGNOSIS — Z992 Dependence on renal dialysis: Secondary | ICD-10-CM | POA: Diagnosis not present

## 2017-12-24 DIAGNOSIS — Z23 Encounter for immunization: Secondary | ICD-10-CM | POA: Diagnosis not present

## 2017-12-25 DIAGNOSIS — N2581 Secondary hyperparathyroidism of renal origin: Secondary | ICD-10-CM | POA: Diagnosis not present

## 2017-12-25 DIAGNOSIS — N186 End stage renal disease: Secondary | ICD-10-CM | POA: Diagnosis not present

## 2017-12-25 DIAGNOSIS — Z23 Encounter for immunization: Secondary | ICD-10-CM | POA: Diagnosis not present

## 2017-12-25 DIAGNOSIS — D631 Anemia in chronic kidney disease: Secondary | ICD-10-CM | POA: Diagnosis not present

## 2017-12-25 DIAGNOSIS — Z992 Dependence on renal dialysis: Secondary | ICD-10-CM | POA: Diagnosis not present

## 2017-12-25 DIAGNOSIS — D509 Iron deficiency anemia, unspecified: Secondary | ICD-10-CM | POA: Diagnosis not present

## 2017-12-26 DIAGNOSIS — N186 End stage renal disease: Secondary | ICD-10-CM | POA: Diagnosis not present

## 2017-12-26 DIAGNOSIS — Z992 Dependence on renal dialysis: Secondary | ICD-10-CM | POA: Diagnosis not present

## 2017-12-26 DIAGNOSIS — D631 Anemia in chronic kidney disease: Secondary | ICD-10-CM | POA: Diagnosis not present

## 2017-12-26 DIAGNOSIS — D509 Iron deficiency anemia, unspecified: Secondary | ICD-10-CM | POA: Diagnosis not present

## 2017-12-26 DIAGNOSIS — Z23 Encounter for immunization: Secondary | ICD-10-CM | POA: Diagnosis not present

## 2017-12-26 DIAGNOSIS — N2581 Secondary hyperparathyroidism of renal origin: Secondary | ICD-10-CM | POA: Diagnosis not present

## 2017-12-27 DIAGNOSIS — Z23 Encounter for immunization: Secondary | ICD-10-CM | POA: Diagnosis not present

## 2017-12-27 DIAGNOSIS — N186 End stage renal disease: Secondary | ICD-10-CM | POA: Diagnosis not present

## 2017-12-27 DIAGNOSIS — D631 Anemia in chronic kidney disease: Secondary | ICD-10-CM | POA: Diagnosis not present

## 2017-12-27 DIAGNOSIS — Z992 Dependence on renal dialysis: Secondary | ICD-10-CM | POA: Diagnosis not present

## 2017-12-27 DIAGNOSIS — D509 Iron deficiency anemia, unspecified: Secondary | ICD-10-CM | POA: Diagnosis not present

## 2017-12-27 DIAGNOSIS — N2581 Secondary hyperparathyroidism of renal origin: Secondary | ICD-10-CM | POA: Diagnosis not present

## 2017-12-28 DIAGNOSIS — Z992 Dependence on renal dialysis: Secondary | ICD-10-CM | POA: Diagnosis not present

## 2017-12-28 DIAGNOSIS — N2581 Secondary hyperparathyroidism of renal origin: Secondary | ICD-10-CM | POA: Diagnosis not present

## 2017-12-28 DIAGNOSIS — D509 Iron deficiency anemia, unspecified: Secondary | ICD-10-CM | POA: Diagnosis not present

## 2017-12-28 DIAGNOSIS — N186 End stage renal disease: Secondary | ICD-10-CM | POA: Diagnosis not present

## 2017-12-28 DIAGNOSIS — D631 Anemia in chronic kidney disease: Secondary | ICD-10-CM | POA: Diagnosis not present

## 2017-12-28 DIAGNOSIS — Z23 Encounter for immunization: Secondary | ICD-10-CM | POA: Diagnosis not present

## 2017-12-29 DIAGNOSIS — Z992 Dependence on renal dialysis: Secondary | ICD-10-CM | POA: Diagnosis not present

## 2017-12-29 DIAGNOSIS — D631 Anemia in chronic kidney disease: Secondary | ICD-10-CM | POA: Diagnosis not present

## 2017-12-29 DIAGNOSIS — D509 Iron deficiency anemia, unspecified: Secondary | ICD-10-CM | POA: Diagnosis not present

## 2017-12-29 DIAGNOSIS — N186 End stage renal disease: Secondary | ICD-10-CM | POA: Diagnosis not present

## 2017-12-30 DIAGNOSIS — D631 Anemia in chronic kidney disease: Secondary | ICD-10-CM | POA: Diagnosis not present

## 2017-12-30 DIAGNOSIS — Z992 Dependence on renal dialysis: Secondary | ICD-10-CM | POA: Diagnosis not present

## 2017-12-30 DIAGNOSIS — N186 End stage renal disease: Secondary | ICD-10-CM | POA: Diagnosis not present

## 2017-12-30 DIAGNOSIS — D509 Iron deficiency anemia, unspecified: Secondary | ICD-10-CM | POA: Diagnosis not present

## 2017-12-31 DIAGNOSIS — D509 Iron deficiency anemia, unspecified: Secondary | ICD-10-CM | POA: Diagnosis not present

## 2017-12-31 DIAGNOSIS — D631 Anemia in chronic kidney disease: Secondary | ICD-10-CM | POA: Diagnosis not present

## 2017-12-31 DIAGNOSIS — Z992 Dependence on renal dialysis: Secondary | ICD-10-CM | POA: Diagnosis not present

## 2017-12-31 DIAGNOSIS — N186 End stage renal disease: Secondary | ICD-10-CM | POA: Diagnosis not present

## 2018-01-01 DIAGNOSIS — D509 Iron deficiency anemia, unspecified: Secondary | ICD-10-CM | POA: Diagnosis not present

## 2018-01-01 DIAGNOSIS — Z992 Dependence on renal dialysis: Secondary | ICD-10-CM | POA: Diagnosis not present

## 2018-01-01 DIAGNOSIS — D631 Anemia in chronic kidney disease: Secondary | ICD-10-CM | POA: Diagnosis not present

## 2018-01-01 DIAGNOSIS — N186 End stage renal disease: Secondary | ICD-10-CM | POA: Diagnosis not present

## 2018-01-01 DIAGNOSIS — Z79899 Other long term (current) drug therapy: Secondary | ICD-10-CM | POA: Diagnosis not present

## 2018-01-02 DIAGNOSIS — D509 Iron deficiency anemia, unspecified: Secondary | ICD-10-CM | POA: Diagnosis not present

## 2018-01-02 DIAGNOSIS — N186 End stage renal disease: Secondary | ICD-10-CM | POA: Diagnosis not present

## 2018-01-02 DIAGNOSIS — Z992 Dependence on renal dialysis: Secondary | ICD-10-CM | POA: Diagnosis not present

## 2018-01-02 DIAGNOSIS — D631 Anemia in chronic kidney disease: Secondary | ICD-10-CM | POA: Diagnosis not present

## 2018-01-03 DIAGNOSIS — D631 Anemia in chronic kidney disease: Secondary | ICD-10-CM | POA: Diagnosis not present

## 2018-01-03 DIAGNOSIS — Z992 Dependence on renal dialysis: Secondary | ICD-10-CM | POA: Diagnosis not present

## 2018-01-03 DIAGNOSIS — N186 End stage renal disease: Secondary | ICD-10-CM | POA: Diagnosis not present

## 2018-01-03 DIAGNOSIS — D509 Iron deficiency anemia, unspecified: Secondary | ICD-10-CM | POA: Diagnosis not present

## 2018-01-04 DIAGNOSIS — D631 Anemia in chronic kidney disease: Secondary | ICD-10-CM | POA: Diagnosis not present

## 2018-01-04 DIAGNOSIS — Z992 Dependence on renal dialysis: Secondary | ICD-10-CM | POA: Diagnosis not present

## 2018-01-04 DIAGNOSIS — D509 Iron deficiency anemia, unspecified: Secondary | ICD-10-CM | POA: Diagnosis not present

## 2018-01-04 DIAGNOSIS — N186 End stage renal disease: Secondary | ICD-10-CM | POA: Diagnosis not present

## 2018-01-05 DIAGNOSIS — D509 Iron deficiency anemia, unspecified: Secondary | ICD-10-CM | POA: Diagnosis not present

## 2018-01-05 DIAGNOSIS — N186 End stage renal disease: Secondary | ICD-10-CM | POA: Diagnosis not present

## 2018-01-05 DIAGNOSIS — D631 Anemia in chronic kidney disease: Secondary | ICD-10-CM | POA: Diagnosis not present

## 2018-01-05 DIAGNOSIS — Z992 Dependence on renal dialysis: Secondary | ICD-10-CM | POA: Diagnosis not present

## 2018-01-06 DIAGNOSIS — N186 End stage renal disease: Secondary | ICD-10-CM | POA: Diagnosis not present

## 2018-01-06 DIAGNOSIS — Z992 Dependence on renal dialysis: Secondary | ICD-10-CM | POA: Diagnosis not present

## 2018-01-06 DIAGNOSIS — D509 Iron deficiency anemia, unspecified: Secondary | ICD-10-CM | POA: Diagnosis not present

## 2018-01-06 DIAGNOSIS — D631 Anemia in chronic kidney disease: Secondary | ICD-10-CM | POA: Diagnosis not present

## 2018-01-07 DIAGNOSIS — D509 Iron deficiency anemia, unspecified: Secondary | ICD-10-CM | POA: Diagnosis not present

## 2018-01-07 DIAGNOSIS — Z992 Dependence on renal dialysis: Secondary | ICD-10-CM | POA: Diagnosis not present

## 2018-01-07 DIAGNOSIS — N186 End stage renal disease: Secondary | ICD-10-CM | POA: Diagnosis not present

## 2018-01-07 DIAGNOSIS — D631 Anemia in chronic kidney disease: Secondary | ICD-10-CM | POA: Diagnosis not present

## 2018-01-08 DIAGNOSIS — D631 Anemia in chronic kidney disease: Secondary | ICD-10-CM | POA: Diagnosis not present

## 2018-01-08 DIAGNOSIS — Z992 Dependence on renal dialysis: Secondary | ICD-10-CM | POA: Diagnosis not present

## 2018-01-08 DIAGNOSIS — D509 Iron deficiency anemia, unspecified: Secondary | ICD-10-CM | POA: Diagnosis not present

## 2018-01-08 DIAGNOSIS — N186 End stage renal disease: Secondary | ICD-10-CM | POA: Diagnosis not present

## 2018-01-09 DIAGNOSIS — D509 Iron deficiency anemia, unspecified: Secondary | ICD-10-CM | POA: Diagnosis not present

## 2018-01-09 DIAGNOSIS — N186 End stage renal disease: Secondary | ICD-10-CM | POA: Diagnosis not present

## 2018-01-09 DIAGNOSIS — Z992 Dependence on renal dialysis: Secondary | ICD-10-CM | POA: Diagnosis not present

## 2018-01-09 DIAGNOSIS — D631 Anemia in chronic kidney disease: Secondary | ICD-10-CM | POA: Diagnosis not present

## 2018-01-10 DIAGNOSIS — D631 Anemia in chronic kidney disease: Secondary | ICD-10-CM | POA: Diagnosis not present

## 2018-01-10 DIAGNOSIS — Z992 Dependence on renal dialysis: Secondary | ICD-10-CM | POA: Diagnosis not present

## 2018-01-10 DIAGNOSIS — N186 End stage renal disease: Secondary | ICD-10-CM | POA: Diagnosis not present

## 2018-01-10 DIAGNOSIS — D509 Iron deficiency anemia, unspecified: Secondary | ICD-10-CM | POA: Diagnosis not present

## 2018-01-11 DIAGNOSIS — Z992 Dependence on renal dialysis: Secondary | ICD-10-CM | POA: Diagnosis not present

## 2018-01-11 DIAGNOSIS — D509 Iron deficiency anemia, unspecified: Secondary | ICD-10-CM | POA: Diagnosis not present

## 2018-01-11 DIAGNOSIS — D631 Anemia in chronic kidney disease: Secondary | ICD-10-CM | POA: Diagnosis not present

## 2018-01-11 DIAGNOSIS — N186 End stage renal disease: Secondary | ICD-10-CM | POA: Diagnosis not present

## 2018-01-12 DIAGNOSIS — N186 End stage renal disease: Secondary | ICD-10-CM | POA: Diagnosis not present

## 2018-01-12 DIAGNOSIS — D631 Anemia in chronic kidney disease: Secondary | ICD-10-CM | POA: Diagnosis not present

## 2018-01-12 DIAGNOSIS — D509 Iron deficiency anemia, unspecified: Secondary | ICD-10-CM | POA: Diagnosis not present

## 2018-01-12 DIAGNOSIS — Z992 Dependence on renal dialysis: Secondary | ICD-10-CM | POA: Diagnosis not present

## 2018-01-13 DIAGNOSIS — D509 Iron deficiency anemia, unspecified: Secondary | ICD-10-CM | POA: Diagnosis not present

## 2018-01-13 DIAGNOSIS — N186 End stage renal disease: Secondary | ICD-10-CM | POA: Diagnosis not present

## 2018-01-13 DIAGNOSIS — Z992 Dependence on renal dialysis: Secondary | ICD-10-CM | POA: Diagnosis not present

## 2018-01-13 DIAGNOSIS — D631 Anemia in chronic kidney disease: Secondary | ICD-10-CM | POA: Diagnosis not present

## 2018-01-14 DIAGNOSIS — D631 Anemia in chronic kidney disease: Secondary | ICD-10-CM | POA: Diagnosis not present

## 2018-01-14 DIAGNOSIS — D509 Iron deficiency anemia, unspecified: Secondary | ICD-10-CM | POA: Diagnosis not present

## 2018-01-14 DIAGNOSIS — Z992 Dependence on renal dialysis: Secondary | ICD-10-CM | POA: Diagnosis not present

## 2018-01-14 DIAGNOSIS — N186 End stage renal disease: Secondary | ICD-10-CM | POA: Diagnosis not present

## 2018-01-15 DIAGNOSIS — D509 Iron deficiency anemia, unspecified: Secondary | ICD-10-CM | POA: Diagnosis not present

## 2018-01-15 DIAGNOSIS — D631 Anemia in chronic kidney disease: Secondary | ICD-10-CM | POA: Diagnosis not present

## 2018-01-15 DIAGNOSIS — N186 End stage renal disease: Secondary | ICD-10-CM | POA: Diagnosis not present

## 2018-01-15 DIAGNOSIS — Z992 Dependence on renal dialysis: Secondary | ICD-10-CM | POA: Diagnosis not present

## 2018-01-16 DIAGNOSIS — D631 Anemia in chronic kidney disease: Secondary | ICD-10-CM | POA: Diagnosis not present

## 2018-01-16 DIAGNOSIS — Z992 Dependence on renal dialysis: Secondary | ICD-10-CM | POA: Diagnosis not present

## 2018-01-16 DIAGNOSIS — N186 End stage renal disease: Secondary | ICD-10-CM | POA: Diagnosis not present

## 2018-01-16 DIAGNOSIS — D509 Iron deficiency anemia, unspecified: Secondary | ICD-10-CM | POA: Diagnosis not present

## 2018-01-17 DIAGNOSIS — D509 Iron deficiency anemia, unspecified: Secondary | ICD-10-CM | POA: Diagnosis not present

## 2018-01-17 DIAGNOSIS — N186 End stage renal disease: Secondary | ICD-10-CM | POA: Diagnosis not present

## 2018-01-17 DIAGNOSIS — D631 Anemia in chronic kidney disease: Secondary | ICD-10-CM | POA: Diagnosis not present

## 2018-01-17 DIAGNOSIS — Z992 Dependence on renal dialysis: Secondary | ICD-10-CM | POA: Diagnosis not present

## 2018-01-18 DIAGNOSIS — D631 Anemia in chronic kidney disease: Secondary | ICD-10-CM | POA: Diagnosis not present

## 2018-01-18 DIAGNOSIS — N186 End stage renal disease: Secondary | ICD-10-CM | POA: Diagnosis not present

## 2018-01-18 DIAGNOSIS — D509 Iron deficiency anemia, unspecified: Secondary | ICD-10-CM | POA: Diagnosis not present

## 2018-01-18 DIAGNOSIS — Z992 Dependence on renal dialysis: Secondary | ICD-10-CM | POA: Diagnosis not present

## 2018-01-19 DIAGNOSIS — N186 End stage renal disease: Secondary | ICD-10-CM | POA: Diagnosis not present

## 2018-01-19 DIAGNOSIS — D509 Iron deficiency anemia, unspecified: Secondary | ICD-10-CM | POA: Diagnosis not present

## 2018-01-19 DIAGNOSIS — Z992 Dependence on renal dialysis: Secondary | ICD-10-CM | POA: Diagnosis not present

## 2018-01-19 DIAGNOSIS — D631 Anemia in chronic kidney disease: Secondary | ICD-10-CM | POA: Diagnosis not present

## 2018-01-20 DIAGNOSIS — D631 Anemia in chronic kidney disease: Secondary | ICD-10-CM | POA: Diagnosis not present

## 2018-01-20 DIAGNOSIS — N186 End stage renal disease: Secondary | ICD-10-CM | POA: Diagnosis not present

## 2018-01-20 DIAGNOSIS — Z992 Dependence on renal dialysis: Secondary | ICD-10-CM | POA: Diagnosis not present

## 2018-01-20 DIAGNOSIS — D509 Iron deficiency anemia, unspecified: Secondary | ICD-10-CM | POA: Diagnosis not present

## 2018-01-21 DIAGNOSIS — Z992 Dependence on renal dialysis: Secondary | ICD-10-CM | POA: Diagnosis not present

## 2018-01-21 DIAGNOSIS — D509 Iron deficiency anemia, unspecified: Secondary | ICD-10-CM | POA: Diagnosis not present

## 2018-01-21 DIAGNOSIS — N186 End stage renal disease: Secondary | ICD-10-CM | POA: Diagnosis not present

## 2018-01-21 DIAGNOSIS — D631 Anemia in chronic kidney disease: Secondary | ICD-10-CM | POA: Diagnosis not present

## 2018-01-22 DIAGNOSIS — N186 End stage renal disease: Secondary | ICD-10-CM | POA: Diagnosis not present

## 2018-01-22 DIAGNOSIS — D631 Anemia in chronic kidney disease: Secondary | ICD-10-CM | POA: Diagnosis not present

## 2018-01-22 DIAGNOSIS — Z992 Dependence on renal dialysis: Secondary | ICD-10-CM | POA: Diagnosis not present

## 2018-01-22 DIAGNOSIS — D509 Iron deficiency anemia, unspecified: Secondary | ICD-10-CM | POA: Diagnosis not present

## 2018-01-23 DIAGNOSIS — Z992 Dependence on renal dialysis: Secondary | ICD-10-CM | POA: Diagnosis not present

## 2018-01-23 DIAGNOSIS — D631 Anemia in chronic kidney disease: Secondary | ICD-10-CM | POA: Diagnosis not present

## 2018-01-23 DIAGNOSIS — D509 Iron deficiency anemia, unspecified: Secondary | ICD-10-CM | POA: Diagnosis not present

## 2018-01-23 DIAGNOSIS — N186 End stage renal disease: Secondary | ICD-10-CM | POA: Diagnosis not present

## 2018-01-24 DIAGNOSIS — D631 Anemia in chronic kidney disease: Secondary | ICD-10-CM | POA: Diagnosis not present

## 2018-01-24 DIAGNOSIS — Z992 Dependence on renal dialysis: Secondary | ICD-10-CM | POA: Diagnosis not present

## 2018-01-24 DIAGNOSIS — N186 End stage renal disease: Secondary | ICD-10-CM | POA: Diagnosis not present

## 2018-01-24 DIAGNOSIS — D509 Iron deficiency anemia, unspecified: Secondary | ICD-10-CM | POA: Diagnosis not present

## 2018-01-25 DIAGNOSIS — D631 Anemia in chronic kidney disease: Secondary | ICD-10-CM | POA: Diagnosis not present

## 2018-01-25 DIAGNOSIS — D509 Iron deficiency anemia, unspecified: Secondary | ICD-10-CM | POA: Diagnosis not present

## 2018-01-25 DIAGNOSIS — N186 End stage renal disease: Secondary | ICD-10-CM | POA: Diagnosis not present

## 2018-01-25 DIAGNOSIS — Z992 Dependence on renal dialysis: Secondary | ICD-10-CM | POA: Diagnosis not present

## 2018-01-26 DIAGNOSIS — D509 Iron deficiency anemia, unspecified: Secondary | ICD-10-CM | POA: Diagnosis not present

## 2018-01-26 DIAGNOSIS — N186 End stage renal disease: Secondary | ICD-10-CM | POA: Diagnosis not present

## 2018-01-26 DIAGNOSIS — Z992 Dependence on renal dialysis: Secondary | ICD-10-CM | POA: Diagnosis not present

## 2018-01-26 DIAGNOSIS — D631 Anemia in chronic kidney disease: Secondary | ICD-10-CM | POA: Diagnosis not present

## 2018-01-27 DIAGNOSIS — Z992 Dependence on renal dialysis: Secondary | ICD-10-CM | POA: Diagnosis not present

## 2018-01-27 DIAGNOSIS — D631 Anemia in chronic kidney disease: Secondary | ICD-10-CM | POA: Diagnosis not present

## 2018-01-27 DIAGNOSIS — D509 Iron deficiency anemia, unspecified: Secondary | ICD-10-CM | POA: Diagnosis not present

## 2018-01-27 DIAGNOSIS — N186 End stage renal disease: Secondary | ICD-10-CM | POA: Diagnosis not present

## 2018-01-28 DIAGNOSIS — D509 Iron deficiency anemia, unspecified: Secondary | ICD-10-CM | POA: Diagnosis not present

## 2018-01-28 DIAGNOSIS — D631 Anemia in chronic kidney disease: Secondary | ICD-10-CM | POA: Diagnosis not present

## 2018-01-28 DIAGNOSIS — N186 End stage renal disease: Secondary | ICD-10-CM | POA: Diagnosis not present

## 2018-01-28 DIAGNOSIS — N2581 Secondary hyperparathyroidism of renal origin: Secondary | ICD-10-CM | POA: Diagnosis not present

## 2018-01-28 DIAGNOSIS — Z992 Dependence on renal dialysis: Secondary | ICD-10-CM | POA: Diagnosis not present

## 2018-01-29 DIAGNOSIS — D509 Iron deficiency anemia, unspecified: Secondary | ICD-10-CM | POA: Diagnosis not present

## 2018-01-29 DIAGNOSIS — N186 End stage renal disease: Secondary | ICD-10-CM | POA: Diagnosis not present

## 2018-01-29 DIAGNOSIS — Z79899 Other long term (current) drug therapy: Secondary | ICD-10-CM | POA: Diagnosis not present

## 2018-01-29 DIAGNOSIS — D631 Anemia in chronic kidney disease: Secondary | ICD-10-CM | POA: Diagnosis not present

## 2018-01-29 DIAGNOSIS — Z992 Dependence on renal dialysis: Secondary | ICD-10-CM | POA: Diagnosis not present

## 2018-01-29 DIAGNOSIS — N2581 Secondary hyperparathyroidism of renal origin: Secondary | ICD-10-CM | POA: Diagnosis not present

## 2018-01-30 DIAGNOSIS — N186 End stage renal disease: Secondary | ICD-10-CM | POA: Diagnosis not present

## 2018-01-30 DIAGNOSIS — D509 Iron deficiency anemia, unspecified: Secondary | ICD-10-CM | POA: Diagnosis not present

## 2018-01-30 DIAGNOSIS — D631 Anemia in chronic kidney disease: Secondary | ICD-10-CM | POA: Diagnosis not present

## 2018-01-30 DIAGNOSIS — Z992 Dependence on renal dialysis: Secondary | ICD-10-CM | POA: Diagnosis not present

## 2018-01-30 DIAGNOSIS — N2581 Secondary hyperparathyroidism of renal origin: Secondary | ICD-10-CM | POA: Diagnosis not present

## 2018-01-31 ENCOUNTER — Ambulatory Visit (HOSPITAL_COMMUNITY)
Admission: RE | Admit: 2018-01-31 | Discharge: 2018-01-31 | Disposition: A | Discharge status: Home / Self Care | Payer: Medicare Other | Source: Ambulatory Visit | Attending: Hematology | Admitting: Hematology

## 2018-01-31 ENCOUNTER — Inpatient Hospital Stay (HOSPITAL_COMMUNITY): Payer: Medicare Other | Attending: Hematology

## 2018-01-31 DIAGNOSIS — I10 Essential (primary) hypertension: Secondary | ICD-10-CM | POA: Diagnosis not present

## 2018-01-31 DIAGNOSIS — Z79899 Other long term (current) drug therapy: Secondary | ICD-10-CM | POA: Diagnosis not present

## 2018-01-31 DIAGNOSIS — Z905 Acquired absence of kidney: Secondary | ICD-10-CM | POA: Insufficient documentation

## 2018-01-31 DIAGNOSIS — I700 Atherosclerosis of aorta: Secondary | ICD-10-CM | POA: Diagnosis not present

## 2018-01-31 DIAGNOSIS — Z992 Dependence on renal dialysis: Secondary | ICD-10-CM | POA: Insufficient documentation

## 2018-01-31 DIAGNOSIS — Z8551 Personal history of malignant neoplasm of bladder: Secondary | ICD-10-CM | POA: Diagnosis not present

## 2018-01-31 DIAGNOSIS — I2510 Atherosclerotic heart disease of native coronary artery without angina pectoris: Secondary | ICD-10-CM | POA: Diagnosis not present

## 2018-01-31 DIAGNOSIS — Z9221 Personal history of antineoplastic chemotherapy: Secondary | ICD-10-CM | POA: Insufficient documentation

## 2018-01-31 DIAGNOSIS — C642 Malignant neoplasm of left kidney, except renal pelvis: Secondary | ICD-10-CM | POA: Diagnosis not present

## 2018-01-31 DIAGNOSIS — N186 End stage renal disease: Secondary | ICD-10-CM | POA: Insufficient documentation

## 2018-01-31 DIAGNOSIS — C649 Malignant neoplasm of unspecified kidney, except renal pelvis: Secondary | ICD-10-CM | POA: Diagnosis not present

## 2018-01-31 DIAGNOSIS — J90 Pleural effusion, not elsewhere classified: Secondary | ICD-10-CM | POA: Insufficient documentation

## 2018-01-31 DIAGNOSIS — D631 Anemia in chronic kidney disease: Secondary | ICD-10-CM | POA: Diagnosis not present

## 2018-01-31 DIAGNOSIS — N2581 Secondary hyperparathyroidism of renal origin: Secondary | ICD-10-CM | POA: Diagnosis not present

## 2018-01-31 DIAGNOSIS — C641 Malignant neoplasm of right kidney, except renal pelvis: Secondary | ICD-10-CM | POA: Diagnosis not present

## 2018-01-31 DIAGNOSIS — D509 Iron deficiency anemia, unspecified: Secondary | ICD-10-CM | POA: Diagnosis not present

## 2018-01-31 DIAGNOSIS — R188 Other ascites: Secondary | ICD-10-CM | POA: Insufficient documentation

## 2018-01-31 LAB — COMPREHENSIVE METABOLIC PANEL
ALT: 14 U/L (ref 0–44)
AST: 10 U/L — AB (ref 15–41)
Albumin: 3.7 g/dL (ref 3.5–5.0)
Alkaline Phosphatase: 82 U/L (ref 38–126)
Anion gap: 20 — ABNORMAL HIGH (ref 5–15)
BUN: 64 mg/dL — AB (ref 8–23)
CO2: 24 mmol/L (ref 22–32)
Calcium: 9.2 mg/dL (ref 8.9–10.3)
Chloride: 89 mmol/L — ABNORMAL LOW (ref 98–111)
Creatinine, Ser: 18.87 mg/dL — ABNORMAL HIGH (ref 0.61–1.24)
GFR calc Af Amer: 3 mL/min — ABNORMAL LOW (ref >60)
GFR calc non Af Amer: 2 mL/min — ABNORMAL LOW (ref >60)
Glucose, Bld: 89 mg/dL (ref 70–99)
POTASSIUM: 5.7 mmol/L — AB (ref 3.5–5.1)
Sodium: 133 mmol/L — ABNORMAL LOW (ref 135–145)
Total Bilirubin: 0.4 mg/dL (ref 0.3–1.2)
Total Protein: 6.8 g/dL (ref 6.5–8.1)

## 2018-01-31 LAB — CBC WITH DIFFERENTIAL/PLATELET
Abs Immature Granulocytes: 0.03 10*3/uL (ref 0.00–0.07)
Basophils Absolute: 0.1 10*3/uL (ref 0.0–0.1)
Basophils Relative: 1 %
Eosinophils Absolute: 0.4 10*3/uL (ref 0.0–0.5)
Eosinophils Relative: 3 %
HCT: 35.2 % — ABNORMAL LOW (ref 39.0–52.0)
Hemoglobin: 11.2 g/dL — ABNORMAL LOW (ref 13.0–17.0)
Immature Granulocytes: 0 %
LYMPHS ABS: 1.1 10*3/uL (ref 0.7–4.0)
Lymphocytes Relative: 10 %
MCH: 32.3 pg (ref 26.0–34.0)
MCHC: 31.8 g/dL (ref 30.0–36.0)
MCV: 101.4 fL — ABNORMAL HIGH (ref 80.0–100.0)
Monocytes Absolute: 1.2 10*3/uL — ABNORMAL HIGH (ref 0.1–1.0)
Monocytes Relative: 11 %
NEUTROS PCT: 75 %
Neutro Abs: 8.4 10*3/uL — ABNORMAL HIGH (ref 1.7–7.7)
Platelets: 269 10*3/uL (ref 150–400)
RBC: 3.47 MIL/uL — ABNORMAL LOW (ref 4.22–5.81)
RDW: 13.8 % (ref 11.5–15.5)
WBC: 11.1 10*3/uL — ABNORMAL HIGH (ref 4.0–10.5)
nRBC: 0 % (ref 0.0–0.2)

## 2018-01-31 MED ORDER — IOPAMIDOL (ISOVUE-300) INJECTION 61%
100.0000 mL | Freq: Once | INTRAVENOUS | Status: AC | PRN
  Administered 2018-01-31: 100 mL via INTRAVENOUS

## 2018-02-01 DIAGNOSIS — N186 End stage renal disease: Secondary | ICD-10-CM | POA: Diagnosis not present

## 2018-02-01 DIAGNOSIS — D509 Iron deficiency anemia, unspecified: Secondary | ICD-10-CM | POA: Diagnosis not present

## 2018-02-01 DIAGNOSIS — D631 Anemia in chronic kidney disease: Secondary | ICD-10-CM | POA: Diagnosis not present

## 2018-02-01 DIAGNOSIS — Z992 Dependence on renal dialysis: Secondary | ICD-10-CM | POA: Diagnosis not present

## 2018-02-01 DIAGNOSIS — N2581 Secondary hyperparathyroidism of renal origin: Secondary | ICD-10-CM | POA: Diagnosis not present

## 2018-02-02 ENCOUNTER — Encounter (HOSPITAL_COMMUNITY): Payer: Self-pay | Admitting: Hematology

## 2018-02-02 ENCOUNTER — Inpatient Hospital Stay (HOSPITAL_BASED_OUTPATIENT_CLINIC_OR_DEPARTMENT_OTHER): Payer: Medicare Other | Admitting: Hematology

## 2018-02-02 ENCOUNTER — Other Ambulatory Visit: Payer: Self-pay

## 2018-02-02 VITALS — BP 131/67 | HR 72 | Temp 97.9°F | Resp 20 | Wt 251.1 lb

## 2018-02-02 DIAGNOSIS — Z9221 Personal history of antineoplastic chemotherapy: Secondary | ICD-10-CM | POA: Diagnosis not present

## 2018-02-02 DIAGNOSIS — Z79899 Other long term (current) drug therapy: Secondary | ICD-10-CM

## 2018-02-02 DIAGNOSIS — N186 End stage renal disease: Secondary | ICD-10-CM | POA: Diagnosis not present

## 2018-02-02 DIAGNOSIS — C689 Malignant neoplasm of urinary organ, unspecified: Secondary | ICD-10-CM

## 2018-02-02 DIAGNOSIS — Z905 Acquired absence of kidney: Secondary | ICD-10-CM

## 2018-02-02 DIAGNOSIS — D631 Anemia in chronic kidney disease: Secondary | ICD-10-CM | POA: Diagnosis not present

## 2018-02-02 DIAGNOSIS — I10 Essential (primary) hypertension: Secondary | ICD-10-CM

## 2018-02-02 DIAGNOSIS — Z8551 Personal history of malignant neoplasm of bladder: Secondary | ICD-10-CM

## 2018-02-02 DIAGNOSIS — C649 Malignant neoplasm of unspecified kidney, except renal pelvis: Secondary | ICD-10-CM

## 2018-02-02 DIAGNOSIS — Z992 Dependence on renal dialysis: Secondary | ICD-10-CM | POA: Diagnosis not present

## 2018-02-02 DIAGNOSIS — N2581 Secondary hyperparathyroidism of renal origin: Secondary | ICD-10-CM | POA: Diagnosis not present

## 2018-02-02 DIAGNOSIS — D509 Iron deficiency anemia, unspecified: Secondary | ICD-10-CM | POA: Diagnosis not present

## 2018-02-02 NOTE — Progress Notes (Signed)
[place], [place] [address]   CLINIC:  Medical Oncology/Hematology  PCP:  Patient, No Pcp Per No address on file None   REASON FOR VISIT: Follow-up for high-grade urothelial carcinoma.  CURRENT THERAPY: observation   INTERVAL HISTORY:  Jesus [name] 67 y.o. male returns for routine follow-up for high-grade urothelial carcinoma. He is here today and has pain in his lower back and legs that is constant. He is more fatigued than normal. He does home peritoneal dialysis. He wants a referral to the dialysis here in [place]. He feels fatigued while at home and is confined to being at home most of the time due being hooked up to his dialysis machine all but 5 hours a day. He denies any bleeding. Denies any nausea, vomiting, or diarrhea. Denies any headaches. Denies any fevers or recent infections. He reports his appetite at 50% and has no problem maintaining his weight. His energy level is 25%.     REVIEW OF SYSTEMS:  Review of Systems  Constitutional: Positive for fatigue.  Respiratory: Positive for shortness of breath.   Skin: Positive for itching.  Neurological: Positive for dizziness, extremity weakness and numbness.  Hematological: Bruises/bleeds easily.  All other systems reviewed and are negative.    PAST MEDICAL/SURGICAL HISTORY:  Past Medical History:  Diagnosis Date  . Anemia   . Anxiety   . Arthritis   . Cancer [facility])    bladder, ureter, Bil kidneys  . CHF (congestive heart failure) ([place])   . Current moderate episode of major depressive disorder without prior episode ([place]) 06/28/2017  . Dermatitis    scaly bump occurs every few months, pt uses cortizone cream and it goes away  . ESRD (end stage renal disease) on dialysis ([place])    Bilateral Nephrectomies- due to cancer  . GERD (gastroesophageal reflux disease) 04/07/2015  . History of blood transfusion   . Hypertension   . Lumbar back pain   . Mental disorder   . Non compliance  w medication regimen    written in error  . Pleural effusion, right    s/p right thoracentesis 10/07/15 (no malignancy by cytology report)  . Shortness of breath dyspnea    Lying down gets short of breath   Past Surgical History:  Procedure Laterality Date  . AV FISTULA PLACEMENT Right 06/05/2015   Procedure: ARTERIOVENOUS (AV) FISTULA CREATION RIGHT ARM;  Surgeon: Angelia Mould, MD;  Location: [place];  Service: Vascular;  Laterality: Right;  . AV FISTULA PLACEMENT Left 07/25/2017   Procedure: RADIOCEPHALIC  ARTERIOVENOUS FISTULA LEFT ARM;  Surgeon: Conrad [place], MD;  Location: [place];  Service: Vascular;  Laterality: Left;  . BLADDER SURGERY  04-06-2015   removal of bladder/ [facility]   . CAPD INSERTION N/A 06/24/2016   Procedure: LAPAROSCOPIC INSERTION CONTINUOUS AMBULATORY PERITONEAL DIALYSIS  (CAPD) CATHETER;  Surgeon: Clovis Riley, MD;  Location: [place];  Service: General;  Laterality: N/A;  . CARPAL TUNNEL RELEASE     left hand x 2; right hand x 1; right elbow  . CERVICAL FUSION     x 2; 2001 & 2013  . EYE SURGERY Bilateral    Cataract removal  . FISTULA SUPERFICIALIZATION Right 10/30/2015   Procedure: SUPERFICIALIZATION BRACHIOCEPHALIC ARTERIOVENOUS FISTULA Right Arm;  Surgeon: Angelia Mould, MD;  Location: [place];  Service: Vascular;  Laterality: Right;  . Hemocatheter    . IR GENERIC HISTORICAL Right 04/29/2016   IR THROMBECTOMY  AV FISTULA W/THROMBOLYSIS/PTA INC/SHUNT/IMG RIGHT 04/29/2016 Arne Cleveland, MD [facility]INTER[facility]  . IR GENERIC HISTORICAL  04/29/2016   IR US GUIDE VASC ACCESS RIGHT 04/29/2016 Arne Cleveland, MD [facility]  . IR THORACENTESIS ASP PLEURAL SPACE W/IMG GUIDE  12/06/2016  . KNEE ARTHROSCOPY Bilateral    bilateral; 2013 R; L [phone]  . KNEE ARTHROSCOPY WITH MEDIAL MENISECTOMY Left 02/26/2013   Procedure: KNEE ARTHROSCOPY WITH MEDIAL MENISECTOMY;  Surgeon: Garald Balding, MD;  Location: [place];  Service: Orthopedics;   Laterality: Left;  . LIGATION OF COMPETING BRANCHES OF ARTERIOVENOUS FISTULA Right 10/30/2015   Procedure: LIGATION OF COMPETING BRANCHES OF BRACHIOCEPHALIC ARTERIOVENOUS FISTULA;  Surgeon: Angelia Mould, MD;  Location: [place];  Service: Vascular;  Laterality: Right;  . NEPHRECTOMY Right September 12, 2012  . NEPHRECTOMY Left 04-06-2015   [place] Right 09/07/2015   Procedure: Fistulagram;  Surgeon: Angelia Mould, MD;  Location: [place] [facility];  Service: Cardiovascular;  Laterality: Right;  ARM  . PERIPHERAL VASCULAR CATHETERIZATION Right 09/07/2015   Procedure: Peripheral Vascular Balloon Angioplasty;  Surgeon: Angelia Mould, MD;  Location: [place] [facility];  Service: Cardiovascular;  Laterality: Right;  upper arm venous  . PROSTATECTOMY  04-06-2015   [place] Bilateral    bilateral; 2005 L  . TOTAL KNEE ARTHROPLASTY  01/24/2012   Procedure: TOTAL KNEE ARTHROPLASTY;  Surgeon: Garald Balding, MD;  Location: [place];  Service: Orthopedics;  Laterality: Right;  RIGHT TOTAL KNEE REPLACEMENT     SOCIAL HISTORY:  Social History   Socioeconomic History  . Marital status: Widowed    Spouse name: Not on file  . Number of children: 1  . Years of education: Not on file  . Highest education level: Some college, no degree  Occupational History  . Occupation: retired  Scientific laboratory technician  . Financial resource strain: Not on file  . Food insecurity:    Worry: Not on file    Inability: Not on file  . Transportation needs:    Medical: Not on file    Non-medical: Not on file  Tobacco Use  . Smoking status: Never Smoker  . Smokeless tobacco: Never Used  Substance and Sexual Activity  . Alcohol use: Not Currently    Alcohol/week: 0.0 standard drinks    Comment: heavy drinker in the past  . Drug use: Never  . Sexual activity: Not on file  Lifestyle  . Physical activity:     Days per week: Not on file    Minutes per session: Not on file  . Stress: Not on file  Relationships  . Social connections:    Talks on phone: Not on file    Gets together: Not on file    Attends religious service: Not on file    Active member of club or organization: Not on file    Attends meetings of clubs or organizations: Not on file    Relationship status: Not on file  . Intimate partner violence:    Fear of current or ex partner: Not on file    Emotionally abused: Not on file    Physically abused: Not on file    Forced sexual activity: Not on file  Other Topics Concern  . Not on file  Social History Narrative   Lives alone. Manages well per patient report. Does not cook much. Eats out a lot. Eats all food groups. Does not  eat much fruit. Married in the past. Has one son. Used to drive a truck.     FAMILY HISTORY:  Family History  Problem Relation Age of Onset  . Cancer Mother   . Hypertension Father   . Atrial fibrillation Father   . Heart disease Father   . Other Father        pacemaker    CURRENT MEDICATIONS:  Outpatient Encounter Medications as of 02/02/2018  Medication Sig Note  . ALPRAZolam (XANAX) 1 MG tablet Take 1 mg by mouth daily as needed for anxiety or sleep.   Marland Kitchen amLODipine (NORVASC) 5 MG tablet Take 5 mg by mouth 2 (two) times daily.   . clobetasol cream (TEMOVATE) 6.37 % Apply 1 application topically 2 (two) times daily as needed (skin irritation).    . fluocinonide (LIDEX) 0.05 % external solution APPLY SOLUTION TOPICALLY TO AFFECTED AREA ONCE DAILY   . lanthanum (FOSRENOL) 500 MG chewable tablet Chew 500 mg by mouth 3 (three) times daily with meals.   Marland Kitchen levothyroxine (SYNTHROID, LEVOTHROID) 75 MCG tablet Take 1 tablet by mouth as directed. 1 hour prior to other medications   . mupirocin ointment (BACTROBAN) 2 % Apply 1 application topically as directed. 1-2 times a day to any wounds   . oxyCODONE-acetaminophen (PERCOCET/ROXICET) 5-325 MG tablet Take 1  tablet by mouth 2 (two) times daily as needed.   [name] Glycol-Propyl Glycol (SYSTANE OP) Place 1 drop into both eyes daily as needed (for dry, itchy eyes). 09/14/2017: Rarely uses  . triamcinolone cream (KENALOG) 0.1 % Apply 1 application topically 2 (two) times daily. As directed. Do not apply to face or skin folds   . [DISCONTINUED] midodrine (PROAMATINE) 5 MG tablet Take 10 mg by mouth 3 (three) times daily.     No facility-administered encounter medications on file as of 02/02/2018.     ALLERGIES:  No Known Allergies   PHYSICAL EXAM:  ECOG Performance status: 1  Vitals:   02/02/18 1442  BP: 131/67  Pulse: 72  Resp: 20  Temp: 97.9 F (36.6 C)  SpO2: 100%   Filed Weights   02/02/18 1442  Weight: 251 lb 1.6 oz (113.9 kg)    Physical Exam  Constitutional: He is oriented to person, place, and time. He appears well-developed and well-nourished.  Cardiovascular: Normal rate, regular rhythm and normal heart sounds.  Pulmonary/Chest: Effort normal and breath sounds normal.  Abdominal: Soft.  Musculoskeletal: Normal range of motion.  Neurological: He is alert and oriented to person, place, and time.  Skin: Skin is warm and dry.  Psychiatric: He has a normal mood and affect. His behavior is normal. Judgment and thought content normal.  Abdomen: Peritoneal catheter present in the left quadrant.  No palpable hepatosplenomegaly or other masses.   LABORATORY DATA:  I have reviewed the labs as listed.  CBC    Component Value Date/Time   WBC 11.1 (H) 01/31/2018 0901   RBC 3.47 (L) 01/31/2018 0901   HGB 11.2 (L) 01/31/2018 0901   HCT 35.2 (L) 01/31/2018 0901   PLT 269 01/31/2018 0901   MCV 101.4 (H) 01/31/2018 0901   MCH 32.3 01/31/2018 0901   MCHC 31.8 01/31/2018 0901   RDW 13.8 01/31/2018 0901   LYMPHSABS 1.1 01/31/2018 0901   MONOABS 1.2 (H) 01/31/2018 0901   EOSABS 0.4 01/31/2018 0901   BASOSABS 0.1 01/31/2018 0901   CMP Latest Ref Rng & Units 01/31/2018 08/07/2017  07/25/2017  Glucose 70 - 99 mg/dL 89 103(H) 96  BUN 8 - 23 mg/dL 64(H) 84(H) -  Creatinine 0.61 - 1.24 mg/dL 18.87(H) 17.42(H) -  Sodium 135 - 145 mmol/L 133(L) 132(L) 132(L)  Potassium 3.5 - 5.1 mmol/L 5.7(H) 5.5(H) 4.9  Chloride 98 - 111 mmol/L 89(L) 88(L) -  CO2 22 - 32 mmol/L 24 26 -  Calcium 8.9 - 10.3 mg/dL 9.2 9.7 -  Total Protein 6.5 - 8.1 g/dL 6.8 6.9 -  Total Bilirubin 0.3 - 1.2 mg/dL 0.4 1.0 -  Alkaline Phos 38 - 126 U/L 82 100 -  AST 15 - 41 U/L 10(L) 16 -  ALT 0 - 44 U/L 14 11(L) -       DIAGNOSTIC IMAGING:  I have independently reviewed the scans and discussed with the patient. .  I have reviewed Francene Finders, NP's note and agree with the documentation.  I personally performed a face-to-face visit, made revisions and my assessment and plan is as follows.     ASSESSMENT & PLAN:   Urothelial cancer ([place]) ASSESSMENT/PLAN 1.  High-grade papillary urothelial carcinoma -right nephrectomy, ureterectomy, and retroperitoneal lymph node dissection with Dr. Brendia Sacks on 09/12/2012. Pathology revealed high-grade papillary urothelial carcinoma pT3N0.  Patient was never smoker.  Family history included colon cancer in paternal grandmother and prostate cancer in a maternal uncle. -Chemotherapy with every 3 week cisplatin 35 mg/m2 and gemcitabine 1000 mg/m2 on days 1 & 8 on 10/21/2013 and completed 4 cycles on 12/30/2013. -S/p ablation of 2 left ureter papillary tumors in April 2016.  -S/p ablation of multiple ureteral and collecting system tumors in July 2016 and again in October 2016, with residual disease remaining. -On basis of FGFR mutation, Pazopanib 800 mg daily started 01/18/2015, dose-reduced to 600mg  daily on 02/02/2015, stopped 03/10/2015 for LFT abnormalities and upcoming surgery. -04/06/2015 complete resection of the left kidney, ureter and bladder, pathology showing T3a urothelial carcinoma of the ureter and bladder, 0/9 lymph nodes - Recurrent right pleural effusion, cytology  on 12/06/2016- for malignant cells.  - Denies any fevers, night sweats or weight loss.  Denies any new onset pains. -Continuing peritoneal dialysis daily.  Requests a referral to [facility] in [place]. -We discussed the results of the CT scan of the abdomen and pelvis dated 01/31/2018 which did not show any evidence of active malignancy.  Prior bilateral nephrectomies and cystoprostatectomy.  Peritoneal dialysis catheter noted.  Stable small right pleural effusion with scarring. - He will be seen back in 6 months for follow-up along with CT scans.      Orders placed this encounter:  Orders Placed This Encounter  Procedures  . CT CHEST W CONTRAST  . CT Abdomen Pelvis W Contrast  . CBC with Differential/Platelet  . Comprehensive metabolic panel      Derek Jack, MD [place] [phone]

## 2018-02-02 NOTE — Assessment & Plan Note (Signed)
ASSESSMENT/PLAN 1.  High-grade papillary urothelial carcinoma -right nephrectomy, ureterectomy, and retroperitoneal lymph node dissection with Dr. Brendia Sacks on 09/12/2012. Pathology revealed high-grade papillary urothelial carcinoma pT3N0.  Patient was never smoker.  Family history included colon cancer in paternal grandmother and prostate cancer in a maternal uncle. -Chemotherapy with every 3 week cisplatin 35 mg/m2 and gemcitabine 1000 mg/m2 on days 1 & 8 on 10/21/2013 and completed 4 cycles on 12/30/2013. -S/p ablation of 2 left ureter papillary tumors in April 2016.  -S/p ablation of multiple ureteral and collecting system tumors in July 2016 and again in October 2016, with residual disease remaining. -On basis of FGFR mutation, Pazopanib 800 mg daily started 01/18/2015, dose-reduced to 600mg  daily on 02/02/2015, stopped 03/10/2015 for LFT abnormalities and upcoming surgery. -04/06/2015 complete resection of the left kidney, ureter and bladder, pathology showing T3a urothelial carcinoma of the ureter and bladder, 0/9 lymph nodes - Recurrent right pleural effusion, cytology on 12/06/2016- for malignant cells.  - Denies any fevers, night sweats or weight loss.  Denies any new onset pains. -Continuing peritoneal dialysis daily.  Requests a referral to [facility] in [place]. -We discussed the results of the CT scan of the abdomen and pelvis dated 01/31/2018 which did not show any evidence of active malignancy.  Prior bilateral nephrectomies and cystoprostatectomy.  Peritoneal dialysis catheter noted.  Stable small right pleural effusion with scarring. - He will be seen back in 6 months for follow-up along with CT scans.

## 2018-02-02 NOTE — Patient Instructions (Signed)
[facility] at [facility] Discharge Instructions     Thank you for choosing [facility] at [facility] to provide your oncology and hematology care.  To afford each patient quality time with our provider, please arrive at least 15 minutes before your scheduled appointment time.   If you have a lab appointment with the Cancer Center please come in thru the  Main Entrance and check in at the main information desk  You need to re-schedule your appointment should you arrive 10 or more minutes late.  We strive to give you quality time with our providers, and arriving late affects you and other patients whose appointments are after yours.  Also, if you no show three or more times for appointments you may be dismissed from the clinic at the providers discretion.     Again, thank you for choosing [facility].  Our hope is that these requests will decrease the amount of time that you wait before being seen by our physicians.       _____________________________________________________________  Should you have questions after your visit to [facility], please contact our office at [phone] between the hours of 8:00 a.m. and 4:30 p.m.  Voicemails left after 4:00 p.m. will not be returned until the following business day.  For prescription refill requests, have your pharmacy contact our office and allow 72 hours.    Cancer Center Support Programs:   > Cancer Support Group  2nd Tuesday of the month 1pm-2pm, Journey Room    

## 2018-02-02 NOTE — Progress Notes (Signed)
Spoke with Blanch Media, Quarry manager at [facility].  Per Blanch Media, patients must me referred from another dialysis center.  Blanch Media also stated that their clinic did not allow at home dialysis, which patient does.  Contacted patient, made him aware of same.

## 2018-02-03 DIAGNOSIS — D509 Iron deficiency anemia, unspecified: Secondary | ICD-10-CM | POA: Diagnosis not present

## 2018-02-03 DIAGNOSIS — D631 Anemia in chronic kidney disease: Secondary | ICD-10-CM | POA: Diagnosis not present

## 2018-02-03 DIAGNOSIS — N2581 Secondary hyperparathyroidism of renal origin: Secondary | ICD-10-CM | POA: Diagnosis not present

## 2018-02-03 DIAGNOSIS — N186 End stage renal disease: Secondary | ICD-10-CM | POA: Diagnosis not present

## 2018-02-03 DIAGNOSIS — Z992 Dependence on renal dialysis: Secondary | ICD-10-CM | POA: Diagnosis not present

## 2018-02-04 DIAGNOSIS — Z992 Dependence on renal dialysis: Secondary | ICD-10-CM | POA: Diagnosis not present

## 2018-02-04 DIAGNOSIS — D631 Anemia in chronic kidney disease: Secondary | ICD-10-CM | POA: Diagnosis not present

## 2018-02-04 DIAGNOSIS — N186 End stage renal disease: Secondary | ICD-10-CM | POA: Diagnosis not present

## 2018-02-04 DIAGNOSIS — N2581 Secondary hyperparathyroidism of renal origin: Secondary | ICD-10-CM | POA: Diagnosis not present

## 2018-02-04 DIAGNOSIS — D509 Iron deficiency anemia, unspecified: Secondary | ICD-10-CM | POA: Diagnosis not present

## 2018-02-05 DIAGNOSIS — N2581 Secondary hyperparathyroidism of renal origin: Secondary | ICD-10-CM | POA: Diagnosis not present

## 2018-02-05 DIAGNOSIS — D631 Anemia in chronic kidney disease: Secondary | ICD-10-CM | POA: Diagnosis not present

## 2018-02-05 DIAGNOSIS — Z992 Dependence on renal dialysis: Secondary | ICD-10-CM | POA: Diagnosis not present

## 2018-02-05 DIAGNOSIS — N186 End stage renal disease: Secondary | ICD-10-CM | POA: Diagnosis not present

## 2018-02-05 DIAGNOSIS — D509 Iron deficiency anemia, unspecified: Secondary | ICD-10-CM | POA: Diagnosis not present

## 2018-02-06 DIAGNOSIS — D631 Anemia in chronic kidney disease: Secondary | ICD-10-CM | POA: Diagnosis not present

## 2018-02-06 DIAGNOSIS — D509 Iron deficiency anemia, unspecified: Secondary | ICD-10-CM | POA: Diagnosis not present

## 2018-02-06 DIAGNOSIS — N2581 Secondary hyperparathyroidism of renal origin: Secondary | ICD-10-CM | POA: Diagnosis not present

## 2018-02-06 DIAGNOSIS — Z992 Dependence on renal dialysis: Secondary | ICD-10-CM | POA: Diagnosis not present

## 2018-02-06 DIAGNOSIS — N186 End stage renal disease: Secondary | ICD-10-CM | POA: Diagnosis not present

## 2018-02-07 DIAGNOSIS — Z992 Dependence on renal dialysis: Secondary | ICD-10-CM | POA: Diagnosis not present

## 2018-02-07 DIAGNOSIS — D631 Anemia in chronic kidney disease: Secondary | ICD-10-CM | POA: Diagnosis not present

## 2018-02-07 DIAGNOSIS — D509 Iron deficiency anemia, unspecified: Secondary | ICD-10-CM | POA: Diagnosis not present

## 2018-02-07 DIAGNOSIS — N186 End stage renal disease: Secondary | ICD-10-CM | POA: Diagnosis not present

## 2018-02-07 DIAGNOSIS — N2581 Secondary hyperparathyroidism of renal origin: Secondary | ICD-10-CM | POA: Diagnosis not present

## 2018-02-08 DIAGNOSIS — Z992 Dependence on renal dialysis: Secondary | ICD-10-CM | POA: Diagnosis not present

## 2018-02-08 DIAGNOSIS — D509 Iron deficiency anemia, unspecified: Secondary | ICD-10-CM | POA: Diagnosis not present

## 2018-02-08 DIAGNOSIS — D631 Anemia in chronic kidney disease: Secondary | ICD-10-CM | POA: Diagnosis not present

## 2018-02-08 DIAGNOSIS — N186 End stage renal disease: Secondary | ICD-10-CM | POA: Diagnosis not present

## 2018-02-08 DIAGNOSIS — N2581 Secondary hyperparathyroidism of renal origin: Secondary | ICD-10-CM | POA: Diagnosis not present

## 2018-02-09 DIAGNOSIS — D631 Anemia in chronic kidney disease: Secondary | ICD-10-CM | POA: Diagnosis not present

## 2018-02-09 DIAGNOSIS — D509 Iron deficiency anemia, unspecified: Secondary | ICD-10-CM | POA: Diagnosis not present

## 2018-02-09 DIAGNOSIS — N186 End stage renal disease: Secondary | ICD-10-CM | POA: Diagnosis not present

## 2018-02-09 DIAGNOSIS — Z992 Dependence on renal dialysis: Secondary | ICD-10-CM | POA: Diagnosis not present

## 2018-02-09 DIAGNOSIS — N2581 Secondary hyperparathyroidism of renal origin: Secondary | ICD-10-CM | POA: Diagnosis not present

## 2018-02-10 DIAGNOSIS — N2581 Secondary hyperparathyroidism of renal origin: Secondary | ICD-10-CM | POA: Diagnosis not present

## 2018-02-10 DIAGNOSIS — D509 Iron deficiency anemia, unspecified: Secondary | ICD-10-CM | POA: Diagnosis not present

## 2018-02-10 DIAGNOSIS — D631 Anemia in chronic kidney disease: Secondary | ICD-10-CM | POA: Diagnosis not present

## 2018-02-10 DIAGNOSIS — N186 End stage renal disease: Secondary | ICD-10-CM | POA: Diagnosis not present

## 2018-02-10 DIAGNOSIS — Z992 Dependence on renal dialysis: Secondary | ICD-10-CM | POA: Diagnosis not present

## 2018-02-11 DIAGNOSIS — N186 End stage renal disease: Secondary | ICD-10-CM | POA: Diagnosis not present

## 2018-02-11 DIAGNOSIS — D631 Anemia in chronic kidney disease: Secondary | ICD-10-CM | POA: Diagnosis not present

## 2018-02-11 DIAGNOSIS — D509 Iron deficiency anemia, unspecified: Secondary | ICD-10-CM | POA: Diagnosis not present

## 2018-02-11 DIAGNOSIS — Z992 Dependence on renal dialysis: Secondary | ICD-10-CM | POA: Diagnosis not present

## 2018-02-11 DIAGNOSIS — N2581 Secondary hyperparathyroidism of renal origin: Secondary | ICD-10-CM | POA: Diagnosis not present

## 2018-02-12 DIAGNOSIS — D631 Anemia in chronic kidney disease: Secondary | ICD-10-CM | POA: Diagnosis not present

## 2018-02-12 DIAGNOSIS — N186 End stage renal disease: Secondary | ICD-10-CM | POA: Diagnosis not present

## 2018-02-12 DIAGNOSIS — Z992 Dependence on renal dialysis: Secondary | ICD-10-CM | POA: Diagnosis not present

## 2018-02-12 DIAGNOSIS — D509 Iron deficiency anemia, unspecified: Secondary | ICD-10-CM | POA: Diagnosis not present

## 2018-02-12 DIAGNOSIS — N2581 Secondary hyperparathyroidism of renal origin: Secondary | ICD-10-CM | POA: Diagnosis not present

## 2018-02-13 DIAGNOSIS — N186 End stage renal disease: Secondary | ICD-10-CM | POA: Diagnosis not present

## 2018-02-13 DIAGNOSIS — N2581 Secondary hyperparathyroidism of renal origin: Secondary | ICD-10-CM | POA: Diagnosis not present

## 2018-02-13 DIAGNOSIS — Z992 Dependence on renal dialysis: Secondary | ICD-10-CM | POA: Diagnosis not present

## 2018-02-13 DIAGNOSIS — D631 Anemia in chronic kidney disease: Secondary | ICD-10-CM | POA: Diagnosis not present

## 2018-02-13 DIAGNOSIS — D509 Iron deficiency anemia, unspecified: Secondary | ICD-10-CM | POA: Diagnosis not present

## 2018-02-14 DIAGNOSIS — Z992 Dependence on renal dialysis: Secondary | ICD-10-CM | POA: Diagnosis not present

## 2018-02-14 DIAGNOSIS — D509 Iron deficiency anemia, unspecified: Secondary | ICD-10-CM | POA: Diagnosis not present

## 2018-02-14 DIAGNOSIS — N186 End stage renal disease: Secondary | ICD-10-CM | POA: Diagnosis not present

## 2018-02-14 DIAGNOSIS — D631 Anemia in chronic kidney disease: Secondary | ICD-10-CM | POA: Diagnosis not present

## 2018-02-14 DIAGNOSIS — N2581 Secondary hyperparathyroidism of renal origin: Secondary | ICD-10-CM | POA: Diagnosis not present

## 2018-02-15 DIAGNOSIS — N186 End stage renal disease: Secondary | ICD-10-CM | POA: Diagnosis not present

## 2018-02-15 DIAGNOSIS — Z992 Dependence on renal dialysis: Secondary | ICD-10-CM | POA: Diagnosis not present

## 2018-02-15 DIAGNOSIS — N2581 Secondary hyperparathyroidism of renal origin: Secondary | ICD-10-CM | POA: Diagnosis not present

## 2018-02-15 DIAGNOSIS — D631 Anemia in chronic kidney disease: Secondary | ICD-10-CM | POA: Diagnosis not present

## 2018-02-15 DIAGNOSIS — D509 Iron deficiency anemia, unspecified: Secondary | ICD-10-CM | POA: Diagnosis not present

## 2018-02-16 DIAGNOSIS — Z992 Dependence on renal dialysis: Secondary | ICD-10-CM | POA: Diagnosis not present

## 2018-02-16 DIAGNOSIS — N186 End stage renal disease: Secondary | ICD-10-CM | POA: Diagnosis not present

## 2018-02-16 DIAGNOSIS — D631 Anemia in chronic kidney disease: Secondary | ICD-10-CM | POA: Diagnosis not present

## 2018-02-16 DIAGNOSIS — D509 Iron deficiency anemia, unspecified: Secondary | ICD-10-CM | POA: Diagnosis not present

## 2018-02-16 DIAGNOSIS — N2581 Secondary hyperparathyroidism of renal origin: Secondary | ICD-10-CM | POA: Diagnosis not present

## 2018-02-17 DIAGNOSIS — D631 Anemia in chronic kidney disease: Secondary | ICD-10-CM | POA: Diagnosis not present

## 2018-02-17 DIAGNOSIS — N186 End stage renal disease: Secondary | ICD-10-CM | POA: Diagnosis not present

## 2018-02-17 DIAGNOSIS — D509 Iron deficiency anemia, unspecified: Secondary | ICD-10-CM | POA: Diagnosis not present

## 2018-02-17 DIAGNOSIS — Z992 Dependence on renal dialysis: Secondary | ICD-10-CM | POA: Diagnosis not present

## 2018-02-17 DIAGNOSIS — N2581 Secondary hyperparathyroidism of renal origin: Secondary | ICD-10-CM | POA: Diagnosis not present

## 2018-02-18 DIAGNOSIS — N186 End stage renal disease: Secondary | ICD-10-CM | POA: Diagnosis not present

## 2018-02-18 DIAGNOSIS — D631 Anemia in chronic kidney disease: Secondary | ICD-10-CM | POA: Diagnosis not present

## 2018-02-18 DIAGNOSIS — D509 Iron deficiency anemia, unspecified: Secondary | ICD-10-CM | POA: Diagnosis not present

## 2018-02-18 DIAGNOSIS — Z992 Dependence on renal dialysis: Secondary | ICD-10-CM | POA: Diagnosis not present

## 2018-02-18 DIAGNOSIS — N2581 Secondary hyperparathyroidism of renal origin: Secondary | ICD-10-CM | POA: Diagnosis not present

## 2018-02-19 DIAGNOSIS — N186 End stage renal disease: Secondary | ICD-10-CM | POA: Diagnosis not present

## 2018-02-19 DIAGNOSIS — D509 Iron deficiency anemia, unspecified: Secondary | ICD-10-CM | POA: Diagnosis not present

## 2018-02-19 DIAGNOSIS — N2581 Secondary hyperparathyroidism of renal origin: Secondary | ICD-10-CM | POA: Diagnosis not present

## 2018-02-19 DIAGNOSIS — Z992 Dependence on renal dialysis: Secondary | ICD-10-CM | POA: Diagnosis not present

## 2018-02-19 DIAGNOSIS — D631 Anemia in chronic kidney disease: Secondary | ICD-10-CM | POA: Diagnosis not present

## 2018-02-20 DIAGNOSIS — D631 Anemia in chronic kidney disease: Secondary | ICD-10-CM | POA: Diagnosis not present

## 2018-02-20 DIAGNOSIS — N186 End stage renal disease: Secondary | ICD-10-CM | POA: Diagnosis not present

## 2018-02-20 DIAGNOSIS — N2581 Secondary hyperparathyroidism of renal origin: Secondary | ICD-10-CM | POA: Diagnosis not present

## 2018-02-20 DIAGNOSIS — Z992 Dependence on renal dialysis: Secondary | ICD-10-CM | POA: Diagnosis not present

## 2018-02-20 DIAGNOSIS — D509 Iron deficiency anemia, unspecified: Secondary | ICD-10-CM | POA: Diagnosis not present

## 2018-02-21 DIAGNOSIS — N2581 Secondary hyperparathyroidism of renal origin: Secondary | ICD-10-CM | POA: Diagnosis not present

## 2018-02-21 DIAGNOSIS — N186 End stage renal disease: Secondary | ICD-10-CM | POA: Diagnosis not present

## 2018-02-21 DIAGNOSIS — D509 Iron deficiency anemia, unspecified: Secondary | ICD-10-CM | POA: Diagnosis not present

## 2018-02-21 DIAGNOSIS — D631 Anemia in chronic kidney disease: Secondary | ICD-10-CM | POA: Diagnosis not present

## 2018-02-21 DIAGNOSIS — Z992 Dependence on renal dialysis: Secondary | ICD-10-CM | POA: Diagnosis not present

## 2018-02-22 DIAGNOSIS — N186 End stage renal disease: Secondary | ICD-10-CM | POA: Diagnosis not present

## 2018-02-22 DIAGNOSIS — Z992 Dependence on renal dialysis: Secondary | ICD-10-CM | POA: Diagnosis not present

## 2018-02-22 DIAGNOSIS — D631 Anemia in chronic kidney disease: Secondary | ICD-10-CM | POA: Diagnosis not present

## 2018-02-22 DIAGNOSIS — D509 Iron deficiency anemia, unspecified: Secondary | ICD-10-CM | POA: Diagnosis not present

## 2018-02-22 DIAGNOSIS — N2581 Secondary hyperparathyroidism of renal origin: Secondary | ICD-10-CM | POA: Diagnosis not present

## 2018-02-23 DIAGNOSIS — D509 Iron deficiency anemia, unspecified: Secondary | ICD-10-CM | POA: Diagnosis not present

## 2018-02-23 DIAGNOSIS — Z992 Dependence on renal dialysis: Secondary | ICD-10-CM | POA: Diagnosis not present

## 2018-02-23 DIAGNOSIS — N2581 Secondary hyperparathyroidism of renal origin: Secondary | ICD-10-CM | POA: Diagnosis not present

## 2018-02-23 DIAGNOSIS — N186 End stage renal disease: Secondary | ICD-10-CM | POA: Diagnosis not present

## 2018-02-23 DIAGNOSIS — D631 Anemia in chronic kidney disease: Secondary | ICD-10-CM | POA: Diagnosis not present

## 2018-02-23 IMAGING — CR DG CHEST 2V
2 series · 2 of 2 positions shown · non-contrast
Comparison: Chest x-ray of December 20, 2016.

CLINICAL DATA: Exertional shortness of breath. Known right pleural
effusion seen on chest x-ray last month. History of urinary tract
malignancy, end-stage renal disease.

EXAM:
CHEST  2 VIEW

[w chest pa]
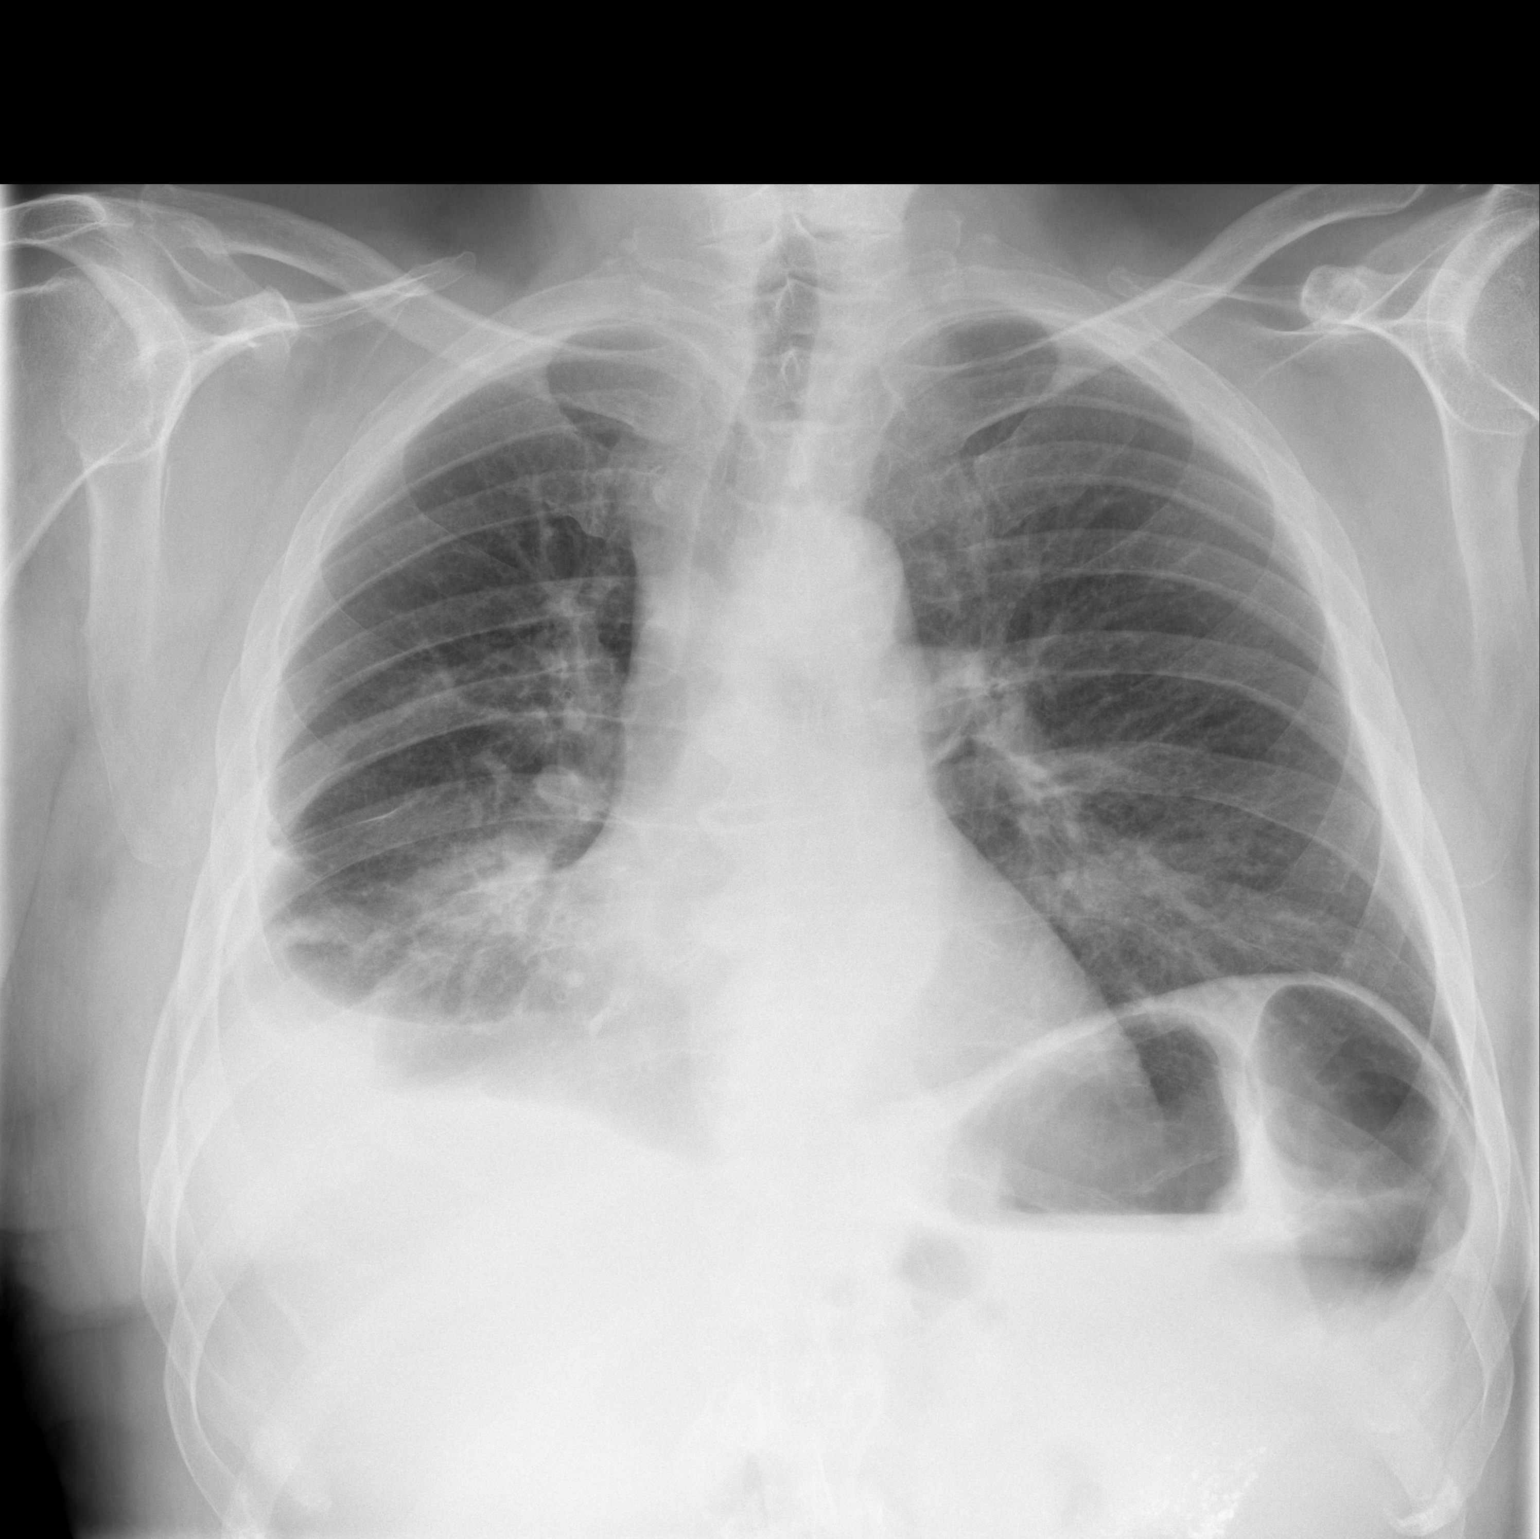

[w chest lat]
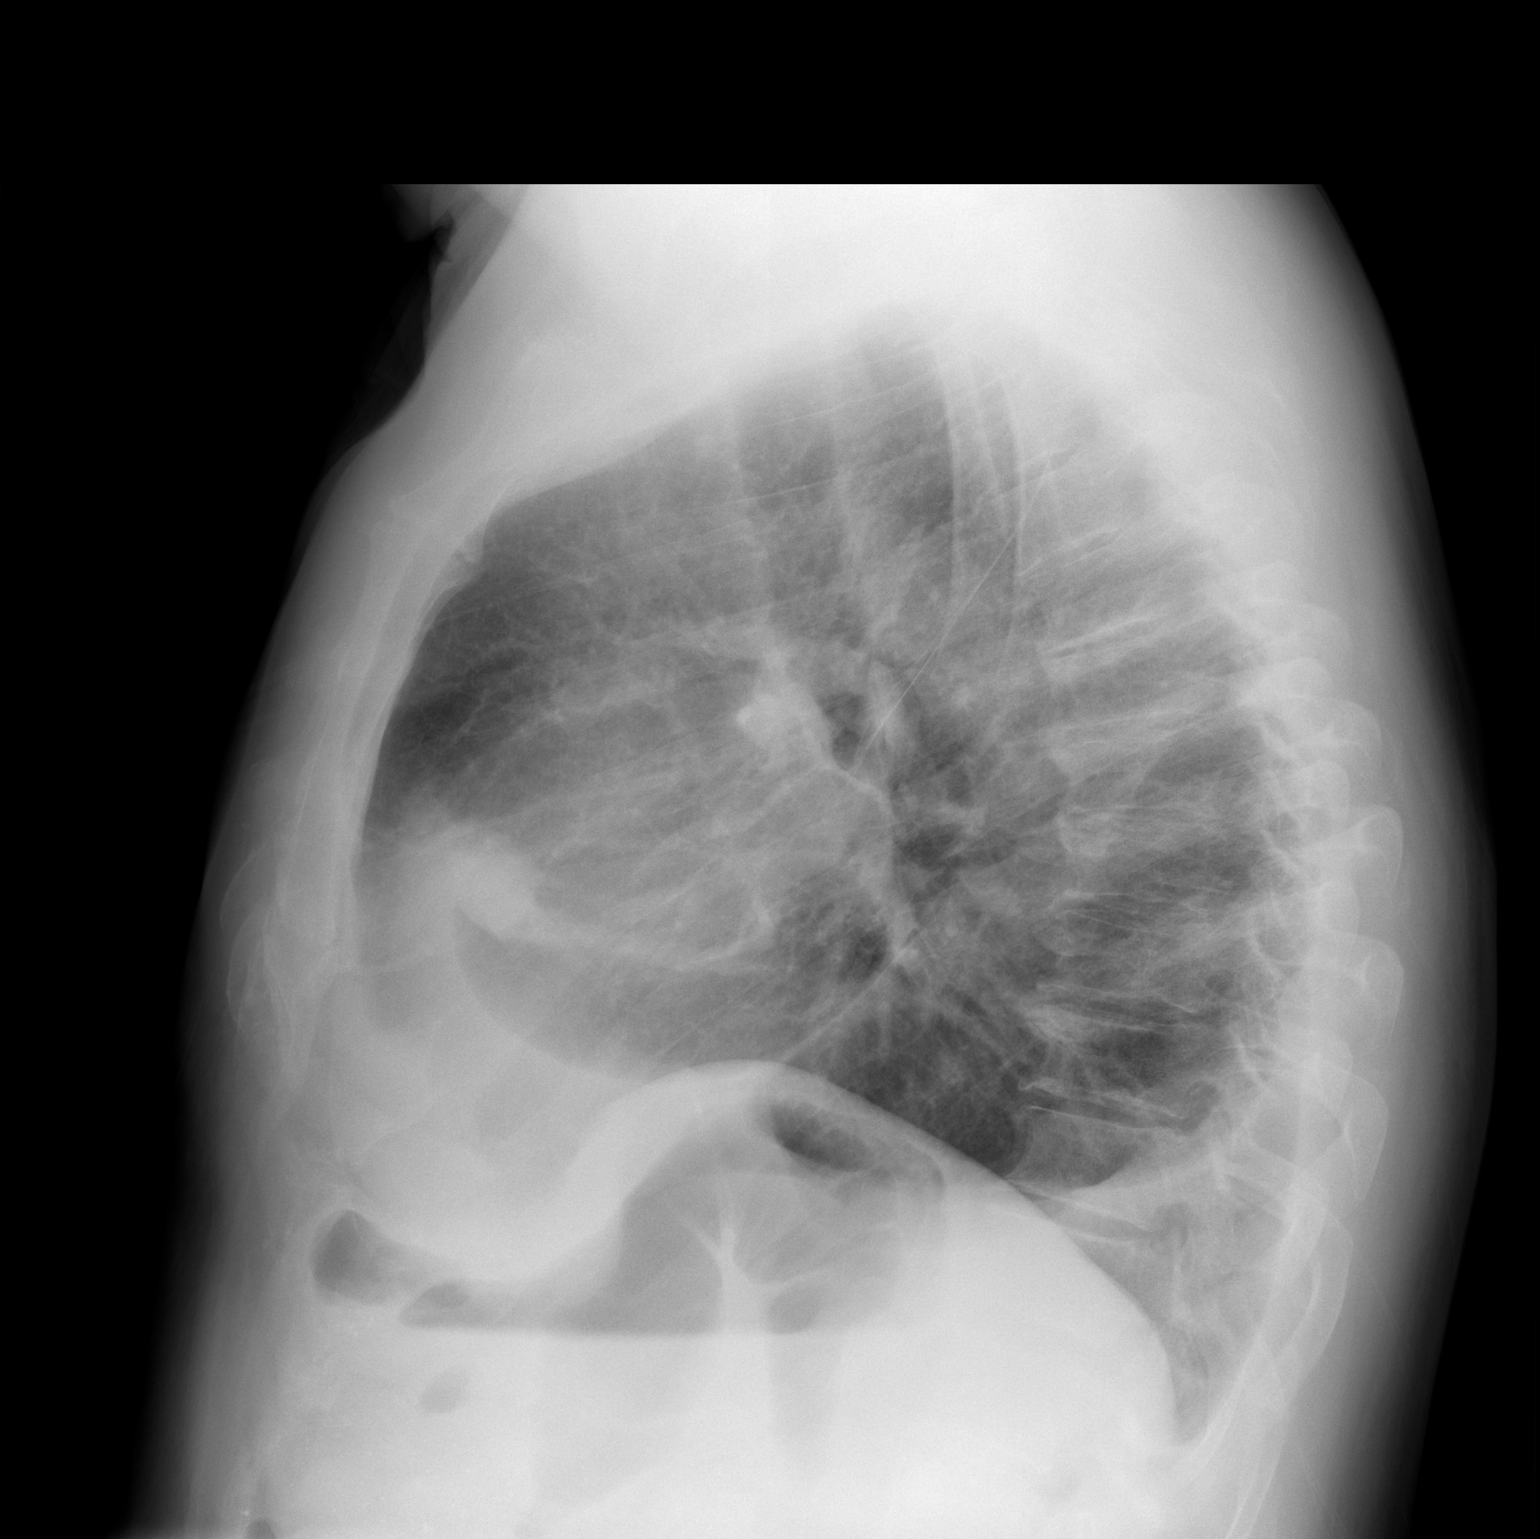

[2 of 2 positions shown; findings below may reference images not displayed]

FINDINGS: There remains a small right pleural effusion. There is patchy
density in the right infrahilar region which is also stable. There
is new increased density in the left infrahilar region. There is no
left pleural effusion. The heart is top-normal in size. The
pulmonary vascularity is not engorged. There is multilevel
degenerative disc disease of the thoracic spine.
IMPRESSION: Stable right pleural effusion. Right infrahilar atelectasis or
infiltrate, stable. New subtle increased density in the left
infrahilar region most compatible with subsegmental atelectasis or
early pneumonia. Followup PA and lateral chest X-ray is recommended
in 3-4 weeks following trial of antibiotic therapy to ensure
resolution and exclude underlying malignancy.

## 2018-02-24 DIAGNOSIS — D631 Anemia in chronic kidney disease: Secondary | ICD-10-CM | POA: Diagnosis not present

## 2018-02-24 DIAGNOSIS — D509 Iron deficiency anemia, unspecified: Secondary | ICD-10-CM | POA: Diagnosis not present

## 2018-02-24 DIAGNOSIS — N186 End stage renal disease: Secondary | ICD-10-CM | POA: Diagnosis not present

## 2018-02-24 DIAGNOSIS — Z992 Dependence on renal dialysis: Secondary | ICD-10-CM | POA: Diagnosis not present

## 2018-02-24 DIAGNOSIS — N2581 Secondary hyperparathyroidism of renal origin: Secondary | ICD-10-CM | POA: Diagnosis not present

## 2018-02-25 DIAGNOSIS — N2581 Secondary hyperparathyroidism of renal origin: Secondary | ICD-10-CM | POA: Diagnosis not present

## 2018-02-25 DIAGNOSIS — N186 End stage renal disease: Secondary | ICD-10-CM | POA: Diagnosis not present

## 2018-02-25 DIAGNOSIS — D509 Iron deficiency anemia, unspecified: Secondary | ICD-10-CM | POA: Diagnosis not present

## 2018-02-25 DIAGNOSIS — Z992 Dependence on renal dialysis: Secondary | ICD-10-CM | POA: Diagnosis not present

## 2018-02-25 DIAGNOSIS — D631 Anemia in chronic kidney disease: Secondary | ICD-10-CM | POA: Diagnosis not present

## 2018-02-26 DIAGNOSIS — Z992 Dependence on renal dialysis: Secondary | ICD-10-CM | POA: Diagnosis not present

## 2018-02-26 DIAGNOSIS — D631 Anemia in chronic kidney disease: Secondary | ICD-10-CM | POA: Diagnosis not present

## 2018-02-26 DIAGNOSIS — N186 End stage renal disease: Secondary | ICD-10-CM | POA: Diagnosis not present

## 2018-02-26 DIAGNOSIS — D509 Iron deficiency anemia, unspecified: Secondary | ICD-10-CM | POA: Diagnosis not present

## 2018-02-26 DIAGNOSIS — N2581 Secondary hyperparathyroidism of renal origin: Secondary | ICD-10-CM | POA: Diagnosis not present

## 2018-02-27 DIAGNOSIS — N186 End stage renal disease: Secondary | ICD-10-CM | POA: Diagnosis not present

## 2018-02-27 DIAGNOSIS — N2581 Secondary hyperparathyroidism of renal origin: Secondary | ICD-10-CM | POA: Diagnosis not present

## 2018-02-27 DIAGNOSIS — I10 Essential (primary) hypertension: Secondary | ICD-10-CM | POA: Diagnosis not present

## 2018-02-27 DIAGNOSIS — D509 Iron deficiency anemia, unspecified: Secondary | ICD-10-CM | POA: Diagnosis not present

## 2018-02-27 DIAGNOSIS — M542 Cervicalgia: Secondary | ICD-10-CM | POA: Diagnosis not present

## 2018-02-27 DIAGNOSIS — T402X5A Adverse effect of other opioids, initial encounter: Secondary | ICD-10-CM | POA: Diagnosis not present

## 2018-02-27 DIAGNOSIS — D631 Anemia in chronic kidney disease: Secondary | ICD-10-CM | POA: Diagnosis not present

## 2018-02-27 DIAGNOSIS — Z992 Dependence on renal dialysis: Secondary | ICD-10-CM | POA: Diagnosis not present

## 2018-02-28 DIAGNOSIS — N2581 Secondary hyperparathyroidism of renal origin: Secondary | ICD-10-CM | POA: Diagnosis not present

## 2018-02-28 DIAGNOSIS — N186 End stage renal disease: Secondary | ICD-10-CM | POA: Diagnosis not present

## 2018-02-28 DIAGNOSIS — Z992 Dependence on renal dialysis: Secondary | ICD-10-CM | POA: Diagnosis not present

## 2018-02-28 DIAGNOSIS — D631 Anemia in chronic kidney disease: Secondary | ICD-10-CM | POA: Diagnosis not present

## 2018-02-28 DIAGNOSIS — D509 Iron deficiency anemia, unspecified: Secondary | ICD-10-CM | POA: Diagnosis not present

## 2018-03-01 DIAGNOSIS — N2581 Secondary hyperparathyroidism of renal origin: Secondary | ICD-10-CM | POA: Diagnosis not present

## 2018-03-01 DIAGNOSIS — E039 Hypothyroidism, unspecified: Secondary | ICD-10-CM | POA: Diagnosis not present

## 2018-03-01 DIAGNOSIS — D509 Iron deficiency anemia, unspecified: Secondary | ICD-10-CM | POA: Diagnosis not present

## 2018-03-01 DIAGNOSIS — D631 Anemia in chronic kidney disease: Secondary | ICD-10-CM | POA: Diagnosis not present

## 2018-03-01 DIAGNOSIS — N186 End stage renal disease: Secondary | ICD-10-CM | POA: Diagnosis not present

## 2018-03-01 DIAGNOSIS — Z992 Dependence on renal dialysis: Secondary | ICD-10-CM | POA: Diagnosis not present

## 2018-03-02 DIAGNOSIS — Z992 Dependence on renal dialysis: Secondary | ICD-10-CM | POA: Diagnosis not present

## 2018-03-02 DIAGNOSIS — D631 Anemia in chronic kidney disease: Secondary | ICD-10-CM | POA: Diagnosis not present

## 2018-03-02 DIAGNOSIS — D509 Iron deficiency anemia, unspecified: Secondary | ICD-10-CM | POA: Diagnosis not present

## 2018-03-02 DIAGNOSIS — N186 End stage renal disease: Secondary | ICD-10-CM | POA: Diagnosis not present

## 2018-03-02 DIAGNOSIS — N2581 Secondary hyperparathyroidism of renal origin: Secondary | ICD-10-CM | POA: Diagnosis not present

## 2018-03-03 DIAGNOSIS — Z992 Dependence on renal dialysis: Secondary | ICD-10-CM | POA: Diagnosis not present

## 2018-03-03 DIAGNOSIS — D631 Anemia in chronic kidney disease: Secondary | ICD-10-CM | POA: Diagnosis not present

## 2018-03-03 DIAGNOSIS — N186 End stage renal disease: Secondary | ICD-10-CM | POA: Diagnosis not present

## 2018-03-03 DIAGNOSIS — N2581 Secondary hyperparathyroidism of renal origin: Secondary | ICD-10-CM | POA: Diagnosis not present

## 2018-03-03 DIAGNOSIS — D509 Iron deficiency anemia, unspecified: Secondary | ICD-10-CM | POA: Diagnosis not present

## 2018-03-04 DIAGNOSIS — N186 End stage renal disease: Secondary | ICD-10-CM | POA: Diagnosis not present

## 2018-03-04 DIAGNOSIS — D509 Iron deficiency anemia, unspecified: Secondary | ICD-10-CM | POA: Diagnosis not present

## 2018-03-04 DIAGNOSIS — D631 Anemia in chronic kidney disease: Secondary | ICD-10-CM | POA: Diagnosis not present

## 2018-03-04 DIAGNOSIS — Z992 Dependence on renal dialysis: Secondary | ICD-10-CM | POA: Diagnosis not present

## 2018-03-04 DIAGNOSIS — N2581 Secondary hyperparathyroidism of renal origin: Secondary | ICD-10-CM | POA: Diagnosis not present

## 2018-03-05 DIAGNOSIS — D509 Iron deficiency anemia, unspecified: Secondary | ICD-10-CM | POA: Diagnosis not present

## 2018-03-05 DIAGNOSIS — N2581 Secondary hyperparathyroidism of renal origin: Secondary | ICD-10-CM | POA: Diagnosis not present

## 2018-03-05 DIAGNOSIS — D631 Anemia in chronic kidney disease: Secondary | ICD-10-CM | POA: Diagnosis not present

## 2018-03-05 DIAGNOSIS — N186 End stage renal disease: Secondary | ICD-10-CM | POA: Diagnosis not present

## 2018-03-05 DIAGNOSIS — Z992 Dependence on renal dialysis: Secondary | ICD-10-CM | POA: Diagnosis not present

## 2018-03-06 DIAGNOSIS — N2581 Secondary hyperparathyroidism of renal origin: Secondary | ICD-10-CM | POA: Diagnosis not present

## 2018-03-06 DIAGNOSIS — N186 End stage renal disease: Secondary | ICD-10-CM | POA: Diagnosis not present

## 2018-03-06 DIAGNOSIS — Z992 Dependence on renal dialysis: Secondary | ICD-10-CM | POA: Diagnosis not present

## 2018-03-06 DIAGNOSIS — D631 Anemia in chronic kidney disease: Secondary | ICD-10-CM | POA: Diagnosis not present

## 2018-03-06 DIAGNOSIS — D509 Iron deficiency anemia, unspecified: Secondary | ICD-10-CM | POA: Diagnosis not present

## 2018-03-07 DIAGNOSIS — Z992 Dependence on renal dialysis: Secondary | ICD-10-CM | POA: Diagnosis not present

## 2018-03-07 DIAGNOSIS — D631 Anemia in chronic kidney disease: Secondary | ICD-10-CM | POA: Diagnosis not present

## 2018-03-07 DIAGNOSIS — N186 End stage renal disease: Secondary | ICD-10-CM | POA: Diagnosis not present

## 2018-03-07 DIAGNOSIS — N2581 Secondary hyperparathyroidism of renal origin: Secondary | ICD-10-CM | POA: Diagnosis not present

## 2018-03-07 DIAGNOSIS — D509 Iron deficiency anemia, unspecified: Secondary | ICD-10-CM | POA: Diagnosis not present

## 2018-03-08 DIAGNOSIS — N186 End stage renal disease: Secondary | ICD-10-CM | POA: Diagnosis not present

## 2018-03-08 DIAGNOSIS — Z992 Dependence on renal dialysis: Secondary | ICD-10-CM | POA: Diagnosis not present

## 2018-03-08 DIAGNOSIS — D631 Anemia in chronic kidney disease: Secondary | ICD-10-CM | POA: Diagnosis not present

## 2018-03-08 DIAGNOSIS — N2581 Secondary hyperparathyroidism of renal origin: Secondary | ICD-10-CM | POA: Diagnosis not present

## 2018-03-08 DIAGNOSIS — D509 Iron deficiency anemia, unspecified: Secondary | ICD-10-CM | POA: Diagnosis not present

## 2018-03-09 DIAGNOSIS — D509 Iron deficiency anemia, unspecified: Secondary | ICD-10-CM | POA: Diagnosis not present

## 2018-03-09 DIAGNOSIS — Z992 Dependence on renal dialysis: Secondary | ICD-10-CM | POA: Diagnosis not present

## 2018-03-09 DIAGNOSIS — M5020 Other cervical disc displacement, unspecified cervical region: Secondary | ICD-10-CM | POA: Diagnosis not present

## 2018-03-09 DIAGNOSIS — M47812 Spondylosis without myelopathy or radiculopathy, cervical region: Secondary | ICD-10-CM | POA: Diagnosis not present

## 2018-03-09 DIAGNOSIS — D631 Anemia in chronic kidney disease: Secondary | ICD-10-CM | POA: Diagnosis not present

## 2018-03-09 DIAGNOSIS — N186 End stage renal disease: Secondary | ICD-10-CM | POA: Diagnosis not present

## 2018-03-09 DIAGNOSIS — N2581 Secondary hyperparathyroidism of renal origin: Secondary | ICD-10-CM | POA: Diagnosis not present

## 2018-03-10 DIAGNOSIS — N2581 Secondary hyperparathyroidism of renal origin: Secondary | ICD-10-CM | POA: Diagnosis not present

## 2018-03-10 DIAGNOSIS — N186 End stage renal disease: Secondary | ICD-10-CM | POA: Diagnosis not present

## 2018-03-10 DIAGNOSIS — Z992 Dependence on renal dialysis: Secondary | ICD-10-CM | POA: Diagnosis not present

## 2018-03-10 DIAGNOSIS — D631 Anemia in chronic kidney disease: Secondary | ICD-10-CM | POA: Diagnosis not present

## 2018-03-10 DIAGNOSIS — D509 Iron deficiency anemia, unspecified: Secondary | ICD-10-CM | POA: Diagnosis not present

## 2018-03-11 DIAGNOSIS — N186 End stage renal disease: Secondary | ICD-10-CM | POA: Diagnosis not present

## 2018-03-11 DIAGNOSIS — D631 Anemia in chronic kidney disease: Secondary | ICD-10-CM | POA: Diagnosis not present

## 2018-03-11 DIAGNOSIS — N2581 Secondary hyperparathyroidism of renal origin: Secondary | ICD-10-CM | POA: Diagnosis not present

## 2018-03-11 DIAGNOSIS — Z992 Dependence on renal dialysis: Secondary | ICD-10-CM | POA: Diagnosis not present

## 2018-03-11 DIAGNOSIS — D509 Iron deficiency anemia, unspecified: Secondary | ICD-10-CM | POA: Diagnosis not present

## 2018-03-12 DIAGNOSIS — D509 Iron deficiency anemia, unspecified: Secondary | ICD-10-CM | POA: Diagnosis not present

## 2018-03-12 DIAGNOSIS — N186 End stage renal disease: Secondary | ICD-10-CM | POA: Diagnosis not present

## 2018-03-12 DIAGNOSIS — Z992 Dependence on renal dialysis: Secondary | ICD-10-CM | POA: Diagnosis not present

## 2018-03-12 DIAGNOSIS — N2581 Secondary hyperparathyroidism of renal origin: Secondary | ICD-10-CM | POA: Diagnosis not present

## 2018-03-12 DIAGNOSIS — D631 Anemia in chronic kidney disease: Secondary | ICD-10-CM | POA: Diagnosis not present

## 2018-03-13 DIAGNOSIS — D509 Iron deficiency anemia, unspecified: Secondary | ICD-10-CM | POA: Diagnosis not present

## 2018-03-13 DIAGNOSIS — D631 Anemia in chronic kidney disease: Secondary | ICD-10-CM | POA: Diagnosis not present

## 2018-03-13 DIAGNOSIS — Z992 Dependence on renal dialysis: Secondary | ICD-10-CM | POA: Diagnosis not present

## 2018-03-13 DIAGNOSIS — N2581 Secondary hyperparathyroidism of renal origin: Secondary | ICD-10-CM | POA: Diagnosis not present

## 2018-03-13 DIAGNOSIS — N186 End stage renal disease: Secondary | ICD-10-CM | POA: Diagnosis not present

## 2018-03-14 DIAGNOSIS — N2581 Secondary hyperparathyroidism of renal origin: Secondary | ICD-10-CM | POA: Diagnosis not present

## 2018-03-14 DIAGNOSIS — N186 End stage renal disease: Secondary | ICD-10-CM | POA: Diagnosis not present

## 2018-03-14 DIAGNOSIS — D509 Iron deficiency anemia, unspecified: Secondary | ICD-10-CM | POA: Diagnosis not present

## 2018-03-14 DIAGNOSIS — Z992 Dependence on renal dialysis: Secondary | ICD-10-CM | POA: Diagnosis not present

## 2018-03-14 DIAGNOSIS — D631 Anemia in chronic kidney disease: Secondary | ICD-10-CM | POA: Diagnosis not present

## 2018-03-15 DIAGNOSIS — N2581 Secondary hyperparathyroidism of renal origin: Secondary | ICD-10-CM | POA: Diagnosis not present

## 2018-03-15 DIAGNOSIS — D631 Anemia in chronic kidney disease: Secondary | ICD-10-CM | POA: Diagnosis not present

## 2018-03-15 DIAGNOSIS — D509 Iron deficiency anemia, unspecified: Secondary | ICD-10-CM | POA: Diagnosis not present

## 2018-03-15 DIAGNOSIS — N186 End stage renal disease: Secondary | ICD-10-CM | POA: Diagnosis not present

## 2018-03-15 DIAGNOSIS — Z992 Dependence on renal dialysis: Secondary | ICD-10-CM | POA: Diagnosis not present

## 2018-03-16 DIAGNOSIS — D631 Anemia in chronic kidney disease: Secondary | ICD-10-CM | POA: Diagnosis not present

## 2018-03-16 DIAGNOSIS — Z992 Dependence on renal dialysis: Secondary | ICD-10-CM | POA: Diagnosis not present

## 2018-03-16 DIAGNOSIS — N2581 Secondary hyperparathyroidism of renal origin: Secondary | ICD-10-CM | POA: Diagnosis not present

## 2018-03-16 DIAGNOSIS — N186 End stage renal disease: Secondary | ICD-10-CM | POA: Diagnosis not present

## 2018-03-16 DIAGNOSIS — D509 Iron deficiency anemia, unspecified: Secondary | ICD-10-CM | POA: Diagnosis not present

## 2018-03-17 DIAGNOSIS — D509 Iron deficiency anemia, unspecified: Secondary | ICD-10-CM | POA: Diagnosis not present

## 2018-03-17 DIAGNOSIS — D631 Anemia in chronic kidney disease: Secondary | ICD-10-CM | POA: Diagnosis not present

## 2018-03-17 DIAGNOSIS — N2581 Secondary hyperparathyroidism of renal origin: Secondary | ICD-10-CM | POA: Diagnosis not present

## 2018-03-17 DIAGNOSIS — Z992 Dependence on renal dialysis: Secondary | ICD-10-CM | POA: Diagnosis not present

## 2018-03-17 DIAGNOSIS — N186 End stage renal disease: Secondary | ICD-10-CM | POA: Diagnosis not present

## 2018-03-18 DIAGNOSIS — N2581 Secondary hyperparathyroidism of renal origin: Secondary | ICD-10-CM | POA: Diagnosis not present

## 2018-03-18 DIAGNOSIS — D631 Anemia in chronic kidney disease: Secondary | ICD-10-CM | POA: Diagnosis not present

## 2018-03-18 DIAGNOSIS — N186 End stage renal disease: Secondary | ICD-10-CM | POA: Diagnosis not present

## 2018-03-18 DIAGNOSIS — D509 Iron deficiency anemia, unspecified: Secondary | ICD-10-CM | POA: Diagnosis not present

## 2018-03-18 DIAGNOSIS — Z992 Dependence on renal dialysis: Secondary | ICD-10-CM | POA: Diagnosis not present

## 2018-03-19 DIAGNOSIS — N186 End stage renal disease: Secondary | ICD-10-CM | POA: Diagnosis not present

## 2018-03-19 DIAGNOSIS — D509 Iron deficiency anemia, unspecified: Secondary | ICD-10-CM | POA: Diagnosis not present

## 2018-03-19 DIAGNOSIS — D631 Anemia in chronic kidney disease: Secondary | ICD-10-CM | POA: Diagnosis not present

## 2018-03-19 DIAGNOSIS — Z992 Dependence on renal dialysis: Secondary | ICD-10-CM | POA: Diagnosis not present

## 2018-03-19 DIAGNOSIS — N2581 Secondary hyperparathyroidism of renal origin: Secondary | ICD-10-CM | POA: Diagnosis not present

## 2018-03-20 DIAGNOSIS — N2581 Secondary hyperparathyroidism of renal origin: Secondary | ICD-10-CM | POA: Diagnosis not present

## 2018-03-20 DIAGNOSIS — N186 End stage renal disease: Secondary | ICD-10-CM | POA: Diagnosis not present

## 2018-03-20 DIAGNOSIS — Z992 Dependence on renal dialysis: Secondary | ICD-10-CM | POA: Diagnosis not present

## 2018-03-20 DIAGNOSIS — D509 Iron deficiency anemia, unspecified: Secondary | ICD-10-CM | POA: Diagnosis not present

## 2018-03-20 DIAGNOSIS — D631 Anemia in chronic kidney disease: Secondary | ICD-10-CM | POA: Diagnosis not present

## 2018-03-21 DIAGNOSIS — Z992 Dependence on renal dialysis: Secondary | ICD-10-CM | POA: Diagnosis not present

## 2018-03-21 DIAGNOSIS — D509 Iron deficiency anemia, unspecified: Secondary | ICD-10-CM | POA: Diagnosis not present

## 2018-03-21 DIAGNOSIS — N186 End stage renal disease: Secondary | ICD-10-CM | POA: Diagnosis not present

## 2018-03-21 DIAGNOSIS — D631 Anemia in chronic kidney disease: Secondary | ICD-10-CM | POA: Diagnosis not present

## 2018-03-21 DIAGNOSIS — N2581 Secondary hyperparathyroidism of renal origin: Secondary | ICD-10-CM | POA: Diagnosis not present

## 2018-03-22 DIAGNOSIS — Z85528 Personal history of other malignant neoplasm of kidney: Secondary | ICD-10-CM | POA: Diagnosis not present

## 2018-03-22 DIAGNOSIS — D631 Anemia in chronic kidney disease: Secondary | ICD-10-CM | POA: Diagnosis not present

## 2018-03-22 DIAGNOSIS — N2581 Secondary hyperparathyroidism of renal origin: Secondary | ICD-10-CM | POA: Diagnosis not present

## 2018-03-22 DIAGNOSIS — I10 Essential (primary) hypertension: Secondary | ICD-10-CM | POA: Diagnosis not present

## 2018-03-22 DIAGNOSIS — M542 Cervicalgia: Secondary | ICD-10-CM | POA: Diagnosis not present

## 2018-03-22 DIAGNOSIS — D509 Iron deficiency anemia, unspecified: Secondary | ICD-10-CM | POA: Diagnosis not present

## 2018-03-22 DIAGNOSIS — Z992 Dependence on renal dialysis: Secondary | ICD-10-CM | POA: Diagnosis not present

## 2018-03-22 DIAGNOSIS — N186 End stage renal disease: Secondary | ICD-10-CM | POA: Diagnosis not present

## 2018-03-23 DIAGNOSIS — D631 Anemia in chronic kidney disease: Secondary | ICD-10-CM | POA: Diagnosis not present

## 2018-03-23 DIAGNOSIS — D509 Iron deficiency anemia, unspecified: Secondary | ICD-10-CM | POA: Diagnosis not present

## 2018-03-23 DIAGNOSIS — Z992 Dependence on renal dialysis: Secondary | ICD-10-CM | POA: Diagnosis not present

## 2018-03-23 DIAGNOSIS — N186 End stage renal disease: Secondary | ICD-10-CM | POA: Diagnosis not present

## 2018-03-23 DIAGNOSIS — N2581 Secondary hyperparathyroidism of renal origin: Secondary | ICD-10-CM | POA: Diagnosis not present

## 2018-03-24 DIAGNOSIS — N2581 Secondary hyperparathyroidism of renal origin: Secondary | ICD-10-CM | POA: Diagnosis not present

## 2018-03-24 DIAGNOSIS — D631 Anemia in chronic kidney disease: Secondary | ICD-10-CM | POA: Diagnosis not present

## 2018-03-24 DIAGNOSIS — D509 Iron deficiency anemia, unspecified: Secondary | ICD-10-CM | POA: Diagnosis not present

## 2018-03-24 DIAGNOSIS — N186 End stage renal disease: Secondary | ICD-10-CM | POA: Diagnosis not present

## 2018-03-24 DIAGNOSIS — Z992 Dependence on renal dialysis: Secondary | ICD-10-CM | POA: Diagnosis not present

## 2018-03-25 DIAGNOSIS — D509 Iron deficiency anemia, unspecified: Secondary | ICD-10-CM | POA: Diagnosis not present

## 2018-03-25 DIAGNOSIS — N186 End stage renal disease: Secondary | ICD-10-CM | POA: Diagnosis not present

## 2018-03-25 DIAGNOSIS — N2581 Secondary hyperparathyroidism of renal origin: Secondary | ICD-10-CM | POA: Diagnosis not present

## 2018-03-25 DIAGNOSIS — D631 Anemia in chronic kidney disease: Secondary | ICD-10-CM | POA: Diagnosis not present

## 2018-03-25 DIAGNOSIS — Z992 Dependence on renal dialysis: Secondary | ICD-10-CM | POA: Diagnosis not present

## 2018-03-26 DIAGNOSIS — N2581 Secondary hyperparathyroidism of renal origin: Secondary | ICD-10-CM | POA: Diagnosis not present

## 2018-03-26 DIAGNOSIS — D631 Anemia in chronic kidney disease: Secondary | ICD-10-CM | POA: Diagnosis not present

## 2018-03-26 DIAGNOSIS — N186 End stage renal disease: Secondary | ICD-10-CM | POA: Diagnosis not present

## 2018-03-26 DIAGNOSIS — Z992 Dependence on renal dialysis: Secondary | ICD-10-CM | POA: Diagnosis not present

## 2018-03-26 DIAGNOSIS — D509 Iron deficiency anemia, unspecified: Secondary | ICD-10-CM | POA: Diagnosis not present

## 2018-03-27 DIAGNOSIS — N2581 Secondary hyperparathyroidism of renal origin: Secondary | ICD-10-CM | POA: Diagnosis not present

## 2018-03-27 DIAGNOSIS — D509 Iron deficiency anemia, unspecified: Secondary | ICD-10-CM | POA: Diagnosis not present

## 2018-03-27 DIAGNOSIS — D631 Anemia in chronic kidney disease: Secondary | ICD-10-CM | POA: Diagnosis not present

## 2018-03-27 DIAGNOSIS — N186 End stage renal disease: Secondary | ICD-10-CM | POA: Diagnosis not present

## 2018-03-27 DIAGNOSIS — Z992 Dependence on renal dialysis: Secondary | ICD-10-CM | POA: Diagnosis not present

## 2018-03-28 DIAGNOSIS — N2581 Secondary hyperparathyroidism of renal origin: Secondary | ICD-10-CM | POA: Diagnosis not present

## 2018-03-28 DIAGNOSIS — Z992 Dependence on renal dialysis: Secondary | ICD-10-CM | POA: Diagnosis not present

## 2018-03-28 DIAGNOSIS — D631 Anemia in chronic kidney disease: Secondary | ICD-10-CM | POA: Diagnosis not present

## 2018-03-28 DIAGNOSIS — N186 End stage renal disease: Secondary | ICD-10-CM | POA: Diagnosis not present

## 2018-03-28 DIAGNOSIS — D509 Iron deficiency anemia, unspecified: Secondary | ICD-10-CM | POA: Diagnosis not present

## 2018-03-29 DIAGNOSIS — N186 End stage renal disease: Secondary | ICD-10-CM | POA: Diagnosis not present

## 2018-03-29 DIAGNOSIS — D631 Anemia in chronic kidney disease: Secondary | ICD-10-CM | POA: Diagnosis not present

## 2018-03-29 DIAGNOSIS — N2581 Secondary hyperparathyroidism of renal origin: Secondary | ICD-10-CM | POA: Diagnosis not present

## 2018-03-29 DIAGNOSIS — Z992 Dependence on renal dialysis: Secondary | ICD-10-CM | POA: Diagnosis not present

## 2018-03-29 DIAGNOSIS — D509 Iron deficiency anemia, unspecified: Secondary | ICD-10-CM | POA: Diagnosis not present

## 2018-03-30 DIAGNOSIS — D509 Iron deficiency anemia, unspecified: Secondary | ICD-10-CM | POA: Diagnosis not present

## 2018-03-30 DIAGNOSIS — D631 Anemia in chronic kidney disease: Secondary | ICD-10-CM | POA: Diagnosis not present

## 2018-03-30 DIAGNOSIS — M549 Dorsalgia, unspecified: Secondary | ICD-10-CM | POA: Diagnosis not present

## 2018-03-30 DIAGNOSIS — N186 End stage renal disease: Secondary | ICD-10-CM | POA: Diagnosis not present

## 2018-03-30 DIAGNOSIS — I10 Essential (primary) hypertension: Secondary | ICD-10-CM | POA: Diagnosis not present

## 2018-03-30 DIAGNOSIS — M5481 Occipital neuralgia: Secondary | ICD-10-CM | POA: Diagnosis not present

## 2018-03-30 DIAGNOSIS — Z6836 Body mass index (BMI) 36.0-36.9, adult: Secondary | ICD-10-CM | POA: Diagnosis not present

## 2018-03-30 DIAGNOSIS — Z992 Dependence on renal dialysis: Secondary | ICD-10-CM | POA: Diagnosis not present

## 2018-03-30 DIAGNOSIS — N2581 Secondary hyperparathyroidism of renal origin: Secondary | ICD-10-CM | POA: Diagnosis not present

## 2018-03-31 DIAGNOSIS — N2581 Secondary hyperparathyroidism of renal origin: Secondary | ICD-10-CM | POA: Diagnosis not present

## 2018-03-31 DIAGNOSIS — D509 Iron deficiency anemia, unspecified: Secondary | ICD-10-CM | POA: Diagnosis not present

## 2018-03-31 DIAGNOSIS — D631 Anemia in chronic kidney disease: Secondary | ICD-10-CM | POA: Diagnosis not present

## 2018-03-31 DIAGNOSIS — Z992 Dependence on renal dialysis: Secondary | ICD-10-CM | POA: Diagnosis not present

## 2018-03-31 DIAGNOSIS — N186 End stage renal disease: Secondary | ICD-10-CM | POA: Diagnosis not present

## 2018-04-01 DIAGNOSIS — N2581 Secondary hyperparathyroidism of renal origin: Secondary | ICD-10-CM | POA: Diagnosis not present

## 2018-04-01 DIAGNOSIS — Z992 Dependence on renal dialysis: Secondary | ICD-10-CM | POA: Diagnosis not present

## 2018-04-01 DIAGNOSIS — D631 Anemia in chronic kidney disease: Secondary | ICD-10-CM | POA: Diagnosis not present

## 2018-04-01 DIAGNOSIS — N186 End stage renal disease: Secondary | ICD-10-CM | POA: Diagnosis not present

## 2018-04-01 DIAGNOSIS — D509 Iron deficiency anemia, unspecified: Secondary | ICD-10-CM | POA: Diagnosis not present

## 2018-04-02 DIAGNOSIS — N186 End stage renal disease: Secondary | ICD-10-CM | POA: Diagnosis not present

## 2018-04-02 DIAGNOSIS — D631 Anemia in chronic kidney disease: Secondary | ICD-10-CM | POA: Diagnosis not present

## 2018-04-02 DIAGNOSIS — D509 Iron deficiency anemia, unspecified: Secondary | ICD-10-CM | POA: Diagnosis not present

## 2018-04-02 DIAGNOSIS — Z992 Dependence on renal dialysis: Secondary | ICD-10-CM | POA: Diagnosis not present

## 2018-04-02 DIAGNOSIS — N2581 Secondary hyperparathyroidism of renal origin: Secondary | ICD-10-CM | POA: Diagnosis not present

## 2018-04-03 DIAGNOSIS — N2581 Secondary hyperparathyroidism of renal origin: Secondary | ICD-10-CM | POA: Diagnosis not present

## 2018-04-03 DIAGNOSIS — N186 End stage renal disease: Secondary | ICD-10-CM | POA: Diagnosis not present

## 2018-04-03 DIAGNOSIS — D509 Iron deficiency anemia, unspecified: Secondary | ICD-10-CM | POA: Diagnosis not present

## 2018-04-03 DIAGNOSIS — Z992 Dependence on renal dialysis: Secondary | ICD-10-CM | POA: Diagnosis not present

## 2018-04-03 DIAGNOSIS — D631 Anemia in chronic kidney disease: Secondary | ICD-10-CM | POA: Diagnosis not present

## 2018-04-04 ENCOUNTER — Encounter (INDEPENDENT_AMBULATORY_CARE_PROVIDER_SITE_OTHER): Payer: Self-pay | Admitting: Orthopaedic Surgery

## 2018-04-04 ENCOUNTER — Ambulatory Visit (INDEPENDENT_AMBULATORY_CARE_PROVIDER_SITE_OTHER): Payer: Medicare Other | Admitting: Orthopaedic Surgery

## 2018-04-04 ENCOUNTER — Ambulatory Visit (INDEPENDENT_AMBULATORY_CARE_PROVIDER_SITE_OTHER): Payer: Medicare Other

## 2018-04-04 VITALS — BP 113/66 | HR 77 | Ht 70.0 in | Wt 250.0 lb

## 2018-04-04 DIAGNOSIS — M25562 Pain in left knee: Secondary | ICD-10-CM

## 2018-04-04 DIAGNOSIS — M1712 Unilateral primary osteoarthritis, left knee: Secondary | ICD-10-CM

## 2018-04-04 DIAGNOSIS — G8929 Other chronic pain: Secondary | ICD-10-CM

## 2018-04-04 DIAGNOSIS — N186 End stage renal disease: Secondary | ICD-10-CM | POA: Diagnosis not present

## 2018-04-04 DIAGNOSIS — D631 Anemia in chronic kidney disease: Secondary | ICD-10-CM | POA: Diagnosis not present

## 2018-04-04 DIAGNOSIS — N2581 Secondary hyperparathyroidism of renal origin: Secondary | ICD-10-CM | POA: Diagnosis not present

## 2018-04-04 DIAGNOSIS — Z992 Dependence on renal dialysis: Secondary | ICD-10-CM | POA: Diagnosis not present

## 2018-04-04 DIAGNOSIS — D509 Iron deficiency anemia, unspecified: Secondary | ICD-10-CM | POA: Diagnosis not present

## 2018-04-04 MED ORDER — BUPIVACAINE HCL 0.5 % IJ SOLN
2.0000 mL | INTRAMUSCULAR | Status: AC | PRN
  Administered 2018-04-04: 2 mL via INTRA_ARTICULAR

## 2018-04-04 MED ORDER — LIDOCAINE HCL 1 % IJ SOLN
2.0000 mL | INTRAMUSCULAR | Status: AC | PRN
  Administered 2018-04-04: 2 mL

## 2018-04-04 MED ORDER — METHYLPREDNISOLONE ACETATE 40 MG/ML IJ SUSP
80.0000 mg | INTRAMUSCULAR | Status: AC | PRN
  Administered 2018-04-04: 80 mg

## 2018-04-04 NOTE — Progress Notes (Signed)
Office Visit Note   Patient: Jesus [name]           Date of Birth: November 10, 1950           MRN: [id number] Visit Date: 04/04/2018              Requested by: No referring provider defined for this encounter. PCP: Patient, No Pcp Per   Assessment & Plan: Visit Diagnoses:  1. Chronic pain of left knee   2. Unilateral primary osteoarthritis, left knee     Plan: Recurrent symptoms of left knee arthritis.  Last given a cortisone injection in 2017 with good relief.  Will inject with cortisone and monitor response  Follow-Up Instructions: Return if symptoms worsen or fail to improve.   Orders:  Orders Placed This Encounter  Procedures  . Large Joint Inj: L knee  . XR KNEE 3 VIEW LEFT   No orders of the defined types were placed in this encounter.     Procedures: Large Joint Inj: L knee on 04/04/2018 2:45 PM Indications: pain and diagnostic evaluation Details: 25 G 1.5 in needle, anteromedial approach  Arthrogram: No  Medications: 2 mL lidocaine 1 %; 2 mL bupivacaine 0.5 %; 80 mg methylPREDNISolone acetate 40 MG/ML Procedure, treatment alternatives, risks and benefits explained, specific risks discussed. Consent was given by the patient. Patient was prepped and draped in the usual sterile fashion.       Clinical Data: No additional findings.   Subjective: Chief Complaint  Patient presents with  . Left Knee - Pain  Patient presents with left knee pain. He describes an intermittent pain that "pinches" medially sometimes. He fell in June.  Last seen in November 2017 for evaluation of the osteoarthritis of his left knee.  Notes that he is feeling recurrent symptoms of pain and sometimes a feeling of giving way.  He is had some recurrent swelling.  Films are consistent with advanced osteoarthritis. He has had a history of renal carcinoma and is now on disability and 3 times a week dialysis HPI  Review of Systems   Objective: Vital Signs: BP 113/66   Pulse 77   Ht 5\' 10"   (1.778 m)   Wt 250 lb (113.4 kg)   BMI 35.87 kg/m   Physical Exam Constitutional:      Appearance: He is well-developed.  Eyes:     Pupils: Pupils are equal, round, and reactive to light.  Pulmonary:     Effort: Pulmonary effort is normal.  Skin:    General: Skin is warm and dry.  Neurological:     Mental Status: He is alert and oriented to person, place, and time.  Psychiatric:        Behavior: Behavior normal.     Ortho Exam awake alert and oriented x3.  Comfortable sitting mild effusion left knee.  Hypertrophic changes about the left knee particularly medially.  Lacked a few degrees to full extension but no limp.  Flexed over 100 degrees without instability.  No popliteal pain.  No calf pain.  Mostly medial joint pain  Specialty Comments:  No specialty comments available.  Imaging: Xr Knee 3 View Left  Result Date: 04/04/2018 As of the left knee obtained in several projections.  There is tricompartmental degenerative arthritis.  There is probably 2 or 3 degrees of varus.  Decrease in the medial more than lateral joint space with peripheral osteophytes.  Large osteophytes about the patella with a lateral patella tilt.  Films are consistent with advanced  osteoarthritis    PMFS History: Patient Active Problem List   Diagnosis Date Noted  . Chronic pain of left knee 04/04/2018  . Current moderate episode of major depressive disorder without prior episode ([place]) 06/28/2017  . Renal cell carcinoma ([place]) 11/08/2016  . Pleural effusion 11/02/2016  . Anemia 11/02/2016  . Urothelial cancer ([place]) 03/31/2016  . ESRD on dialysis ([place]) 09/07/2015  . GERD (gastroesophageal reflux disease) 04/07/2015  . HTN (hypertension) 04/07/2015  . Torn medial meniscus 02/26/2013  . 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 Anemia associated with acute blood loss 01/26/2012    Class: Acute  . Unilateral primary osteoarthritis, left knee 01/26/2012  . Postoperative anemia  due to acute blood loss 01/26/2012  . Hyponatremia 01/26/2012   Past Medical History:  Diagnosis Date  . Anemia   . Anxiety   . Arthritis   . Cancer [facility])    bladder, ureter, Bil kidneys  . CHF (congestive heart failure) ([place])   . Current moderate episode of major depressive disorder without prior episode ([place]) 06/28/2017  . Dermatitis    scaly bump occurs every few months, pt uses cortizone cream and it goes away  . ESRD (end stage renal disease) on dialysis ([place])    Bilateral Nephrectomies- due to cancer  . GERD (gastroesophageal reflux disease) 04/07/2015  . History of blood transfusion   . Hypertension   . Lumbar back pain   . Mental disorder   . Non compliance w medication regimen    written in error  . Pleural effusion, right    s/p right thoracentesis 10/07/15 (no malignancy by cytology report)  . Shortness of breath dyspnea    Lying down gets short of breath    Family History  Problem Relation Age of Onset  . Cancer Mother   . Hypertension Father   . Atrial fibrillation Father   . Heart disease Father   . Other Father        pacemaker    Past Surgical History:  Procedure Laterality Date  . AV FISTULA PLACEMENT Right 06/05/2015   Procedure: ARTERIOVENOUS (AV) FISTULA CREATION RIGHT ARM;  Surgeon: Angelia Mould, MD;  Location: [place];  Service: Vascular;  Laterality: Right;  . AV FISTULA PLACEMENT Left 07/25/2017   Procedure: RADIOCEPHALIC  ARTERIOVENOUS FISTULA LEFT ARM;  Surgeon: Conrad [place], MD;  Location: [place];  Service: Vascular;  Laterality: Left;  . BLADDER SURGERY  04-06-2015   removal of bladder/ [facility]   . CAPD INSERTION N/A 06/24/2016   Procedure: LAPAROSCOPIC INSERTION CONTINUOUS AMBULATORY PERITONEAL DIALYSIS  (CAPD) CATHETER;  Surgeon: Clovis Riley, MD;  Location: [place];  Service: General;  Laterality: N/A;  . CARPAL TUNNEL RELEASE     left hand x 2; right hand x 1; right elbow  . CERVICAL FUSION     x 2; 2001 & 2013  .  EYE SURGERY Bilateral    Cataract removal  . FISTULA SUPERFICIALIZATION Right 10/30/2015   Procedure: SUPERFICIALIZATION BRACHIOCEPHALIC ARTERIOVENOUS FISTULA Right Arm;  Surgeon: Angelia Mould, MD;  Location: [place];  Service: Vascular;  Laterality: Right;  . Hemocatheter    . IR GENERIC HISTORICAL Right 04/29/2016   IR THROMBECTOMY AV FISTULA W/THROMBOLYSIS/PTA INC/SHUNT/IMG RIGHT 04/29/2016 Arne Cleveland, MD [facility]  . IR GENERIC HISTORICAL  04/29/2016   IR US GUIDE VASC ACCESS RIGHT 04/29/2016 Arne Cleveland, MD [facility]  . IR THORACENTESIS ASP PLEURAL SPACE W/IMG GUIDE  12/06/2016  .  KNEE ARTHROSCOPY Bilateral    bilateral; 2013 R; L [phone]  . KNEE ARTHROSCOPY WITH MEDIAL MENISECTOMY Left 02/26/2013   Procedure: KNEE ARTHROSCOPY WITH MEDIAL MENISECTOMY;  Surgeon: Garald Balding, MD;  Location: [place];  Service: Orthopedics;  Laterality: Left;  . LIGATION OF COMPETING BRANCHES OF ARTERIOVENOUS FISTULA Right 10/30/2015   Procedure: LIGATION OF COMPETING BRANCHES OF BRACHIOCEPHALIC ARTERIOVENOUS FISTULA;  Surgeon: Angelia Mould, MD;  Location: [place];  Service: Vascular;  Laterality: Right;  . NEPHRECTOMY Right September 12, 2012  . NEPHRECTOMY Left 04-06-2015   [place] Right 09/07/2015   Procedure: Fistulagram;  Surgeon: Angelia Mould, MD;  Location: [place] CV [facility];  Service: Cardiovascular;  Laterality: Right;  ARM  . PERIPHERAL VASCULAR CATHETERIZATION Right 09/07/2015   Procedure: Peripheral Vascular Balloon Angioplasty;  Surgeon: Angelia Mould, MD;  Location: [place] CV [facility];  Service: Cardiovascular;  Laterality: Right;  upper arm venous  . PROSTATECTOMY  04-06-2015   [place] Bilateral    bilateral; 2005 L  . TOTAL KNEE ARTHROPLASTY  01/24/2012   Procedure: TOTAL KNEE ARTHROPLASTY;  Surgeon: Garald Balding, MD;  Location: [place];   Service: Orthopedics;  Laterality: Right;  RIGHT TOTAL KNEE REPLACEMENT   Social History   Occupational History  . Occupation: retired  Tobacco Use  . Smoking status: Never Smoker  . Smokeless tobacco: Never Used  Substance and Sexual Activity  . Alcohol use: Not Currently    Alcohol/week: 0.0 standard drinks    Comment: heavy drinker in the past  . Drug use: Never  . Sexual activity: Not on file

## 2018-04-05 DIAGNOSIS — D509 Iron deficiency anemia, unspecified: Secondary | ICD-10-CM | POA: Diagnosis not present

## 2018-04-05 DIAGNOSIS — N2581 Secondary hyperparathyroidism of renal origin: Secondary | ICD-10-CM | POA: Diagnosis not present

## 2018-04-05 DIAGNOSIS — N186 End stage renal disease: Secondary | ICD-10-CM | POA: Diagnosis not present

## 2018-04-05 DIAGNOSIS — Z992 Dependence on renal dialysis: Secondary | ICD-10-CM | POA: Diagnosis not present

## 2018-04-05 DIAGNOSIS — D631 Anemia in chronic kidney disease: Secondary | ICD-10-CM | POA: Diagnosis not present

## 2018-04-06 DIAGNOSIS — N2581 Secondary hyperparathyroidism of renal origin: Secondary | ICD-10-CM | POA: Diagnosis not present

## 2018-04-06 DIAGNOSIS — N186 End stage renal disease: Secondary | ICD-10-CM | POA: Diagnosis not present

## 2018-04-06 DIAGNOSIS — D509 Iron deficiency anemia, unspecified: Secondary | ICD-10-CM | POA: Diagnosis not present

## 2018-04-06 DIAGNOSIS — D631 Anemia in chronic kidney disease: Secondary | ICD-10-CM | POA: Diagnosis not present

## 2018-04-06 DIAGNOSIS — Z992 Dependence on renal dialysis: Secondary | ICD-10-CM | POA: Diagnosis not present

## 2018-04-07 DIAGNOSIS — Z992 Dependence on renal dialysis: Secondary | ICD-10-CM | POA: Diagnosis not present

## 2018-04-07 DIAGNOSIS — N186 End stage renal disease: Secondary | ICD-10-CM | POA: Diagnosis not present

## 2018-04-07 DIAGNOSIS — N2581 Secondary hyperparathyroidism of renal origin: Secondary | ICD-10-CM | POA: Diagnosis not present

## 2018-04-07 DIAGNOSIS — D509 Iron deficiency anemia, unspecified: Secondary | ICD-10-CM | POA: Diagnosis not present

## 2018-04-07 DIAGNOSIS — D631 Anemia in chronic kidney disease: Secondary | ICD-10-CM | POA: Diagnosis not present

## 2018-04-08 DIAGNOSIS — D631 Anemia in chronic kidney disease: Secondary | ICD-10-CM | POA: Diagnosis not present

## 2018-04-08 DIAGNOSIS — Z992 Dependence on renal dialysis: Secondary | ICD-10-CM | POA: Diagnosis not present

## 2018-04-08 DIAGNOSIS — N2581 Secondary hyperparathyroidism of renal origin: Secondary | ICD-10-CM | POA: Diagnosis not present

## 2018-04-08 DIAGNOSIS — D509 Iron deficiency anemia, unspecified: Secondary | ICD-10-CM | POA: Diagnosis not present

## 2018-04-08 DIAGNOSIS — N186 End stage renal disease: Secondary | ICD-10-CM | POA: Diagnosis not present

## 2018-04-09 DIAGNOSIS — N186 End stage renal disease: Secondary | ICD-10-CM | POA: Diagnosis not present

## 2018-04-09 DIAGNOSIS — D631 Anemia in chronic kidney disease: Secondary | ICD-10-CM | POA: Diagnosis not present

## 2018-04-09 DIAGNOSIS — D509 Iron deficiency anemia, unspecified: Secondary | ICD-10-CM | POA: Diagnosis not present

## 2018-04-09 DIAGNOSIS — Z992 Dependence on renal dialysis: Secondary | ICD-10-CM | POA: Diagnosis not present

## 2018-04-09 DIAGNOSIS — N2581 Secondary hyperparathyroidism of renal origin: Secondary | ICD-10-CM | POA: Diagnosis not present

## 2018-04-10 DIAGNOSIS — D509 Iron deficiency anemia, unspecified: Secondary | ICD-10-CM | POA: Diagnosis not present

## 2018-04-10 DIAGNOSIS — D631 Anemia in chronic kidney disease: Secondary | ICD-10-CM | POA: Diagnosis not present

## 2018-04-10 DIAGNOSIS — N186 End stage renal disease: Secondary | ICD-10-CM | POA: Diagnosis not present

## 2018-04-10 DIAGNOSIS — N2581 Secondary hyperparathyroidism of renal origin: Secondary | ICD-10-CM | POA: Diagnosis not present

## 2018-04-10 DIAGNOSIS — Z992 Dependence on renal dialysis: Secondary | ICD-10-CM | POA: Diagnosis not present

## 2018-04-11 DIAGNOSIS — D509 Iron deficiency anemia, unspecified: Secondary | ICD-10-CM | POA: Diagnosis not present

## 2018-04-11 DIAGNOSIS — N186 End stage renal disease: Secondary | ICD-10-CM | POA: Diagnosis not present

## 2018-04-11 DIAGNOSIS — N2581 Secondary hyperparathyroidism of renal origin: Secondary | ICD-10-CM | POA: Diagnosis not present

## 2018-04-11 DIAGNOSIS — Z992 Dependence on renal dialysis: Secondary | ICD-10-CM | POA: Diagnosis not present

## 2018-04-11 DIAGNOSIS — D631 Anemia in chronic kidney disease: Secondary | ICD-10-CM | POA: Diagnosis not present

## 2018-04-12 DIAGNOSIS — D631 Anemia in chronic kidney disease: Secondary | ICD-10-CM | POA: Diagnosis not present

## 2018-04-12 DIAGNOSIS — N186 End stage renal disease: Secondary | ICD-10-CM | POA: Diagnosis not present

## 2018-04-12 DIAGNOSIS — Z992 Dependence on renal dialysis: Secondary | ICD-10-CM | POA: Diagnosis not present

## 2018-04-12 DIAGNOSIS — N2581 Secondary hyperparathyroidism of renal origin: Secondary | ICD-10-CM | POA: Diagnosis not present

## 2018-04-12 DIAGNOSIS — D509 Iron deficiency anemia, unspecified: Secondary | ICD-10-CM | POA: Diagnosis not present

## 2018-04-13 DIAGNOSIS — N2581 Secondary hyperparathyroidism of renal origin: Secondary | ICD-10-CM | POA: Diagnosis not present

## 2018-04-13 DIAGNOSIS — Z992 Dependence on renal dialysis: Secondary | ICD-10-CM | POA: Diagnosis not present

## 2018-04-13 DIAGNOSIS — D631 Anemia in chronic kidney disease: Secondary | ICD-10-CM | POA: Diagnosis not present

## 2018-04-13 DIAGNOSIS — N186 End stage renal disease: Secondary | ICD-10-CM | POA: Diagnosis not present

## 2018-04-13 DIAGNOSIS — D509 Iron deficiency anemia, unspecified: Secondary | ICD-10-CM | POA: Diagnosis not present

## 2018-04-14 DIAGNOSIS — N2581 Secondary hyperparathyroidism of renal origin: Secondary | ICD-10-CM | POA: Diagnosis not present

## 2018-04-14 DIAGNOSIS — N186 End stage renal disease: Secondary | ICD-10-CM | POA: Diagnosis not present

## 2018-04-14 DIAGNOSIS — D631 Anemia in chronic kidney disease: Secondary | ICD-10-CM | POA: Diagnosis not present

## 2018-04-14 DIAGNOSIS — D509 Iron deficiency anemia, unspecified: Secondary | ICD-10-CM | POA: Diagnosis not present

## 2018-04-14 DIAGNOSIS — Z992 Dependence on renal dialysis: Secondary | ICD-10-CM | POA: Diagnosis not present

## 2018-04-15 DIAGNOSIS — D631 Anemia in chronic kidney disease: Secondary | ICD-10-CM | POA: Diagnosis not present

## 2018-04-15 DIAGNOSIS — D509 Iron deficiency anemia, unspecified: Secondary | ICD-10-CM | POA: Diagnosis not present

## 2018-04-15 DIAGNOSIS — N2581 Secondary hyperparathyroidism of renal origin: Secondary | ICD-10-CM | POA: Diagnosis not present

## 2018-04-15 DIAGNOSIS — Z992 Dependence on renal dialysis: Secondary | ICD-10-CM | POA: Diagnosis not present

## 2018-04-15 DIAGNOSIS — N186 End stage renal disease: Secondary | ICD-10-CM | POA: Diagnosis not present

## 2018-04-16 DIAGNOSIS — N2581 Secondary hyperparathyroidism of renal origin: Secondary | ICD-10-CM | POA: Diagnosis not present

## 2018-04-16 DIAGNOSIS — Z992 Dependence on renal dialysis: Secondary | ICD-10-CM | POA: Diagnosis not present

## 2018-04-16 DIAGNOSIS — D509 Iron deficiency anemia, unspecified: Secondary | ICD-10-CM | POA: Diagnosis not present

## 2018-04-16 DIAGNOSIS — N186 End stage renal disease: Secondary | ICD-10-CM | POA: Diagnosis not present

## 2018-04-16 DIAGNOSIS — D631 Anemia in chronic kidney disease: Secondary | ICD-10-CM | POA: Diagnosis not present

## 2018-04-17 ENCOUNTER — Ambulatory Visit (HOSPITAL_COMMUNITY)
Admission: RE | Admit: 2018-04-17 | Discharge: 2018-04-17 | Disposition: A | Discharge status: Home / Self Care | Payer: Medicare Other | Source: Ambulatory Visit | Attending: Internal Medicine | Admitting: Internal Medicine

## 2018-04-17 ENCOUNTER — Other Ambulatory Visit (HOSPITAL_COMMUNITY): Payer: Self-pay | Admitting: Internal Medicine

## 2018-04-17 DIAGNOSIS — R0602 Shortness of breath: Secondary | ICD-10-CM | POA: Insufficient documentation

## 2018-04-17 DIAGNOSIS — I10 Essential (primary) hypertension: Secondary | ICD-10-CM | POA: Diagnosis not present

## 2018-04-17 DIAGNOSIS — D509 Iron deficiency anemia, unspecified: Secondary | ICD-10-CM | POA: Diagnosis not present

## 2018-04-17 DIAGNOSIS — G894 Chronic pain syndrome: Secondary | ICD-10-CM | POA: Diagnosis not present

## 2018-04-17 DIAGNOSIS — N186 End stage renal disease: Secondary | ICD-10-CM | POA: Diagnosis not present

## 2018-04-17 DIAGNOSIS — Z992 Dependence on renal dialysis: Secondary | ICD-10-CM | POA: Diagnosis not present

## 2018-04-17 DIAGNOSIS — N2581 Secondary hyperparathyroidism of renal origin: Secondary | ICD-10-CM | POA: Diagnosis not present

## 2018-04-17 DIAGNOSIS — D631 Anemia in chronic kidney disease: Secondary | ICD-10-CM | POA: Diagnosis not present

## 2018-04-17 DIAGNOSIS — I959 Hypotension, unspecified: Secondary | ICD-10-CM | POA: Diagnosis not present

## 2018-04-17 DIAGNOSIS — Z85528 Personal history of other malignant neoplasm of kidney: Secondary | ICD-10-CM | POA: Diagnosis not present

## 2018-04-17 DIAGNOSIS — F419 Anxiety disorder, unspecified: Secondary | ICD-10-CM | POA: Diagnosis not present

## 2018-04-18 DIAGNOSIS — Z992 Dependence on renal dialysis: Secondary | ICD-10-CM | POA: Diagnosis not present

## 2018-04-18 DIAGNOSIS — N186 End stage renal disease: Secondary | ICD-10-CM | POA: Diagnosis not present

## 2018-04-18 DIAGNOSIS — D509 Iron deficiency anemia, unspecified: Secondary | ICD-10-CM | POA: Diagnosis not present

## 2018-04-18 DIAGNOSIS — N2581 Secondary hyperparathyroidism of renal origin: Secondary | ICD-10-CM | POA: Diagnosis not present

## 2018-04-18 DIAGNOSIS — D631 Anemia in chronic kidney disease: Secondary | ICD-10-CM | POA: Diagnosis not present

## 2018-04-19 DIAGNOSIS — D509 Iron deficiency anemia, unspecified: Secondary | ICD-10-CM | POA: Diagnosis not present

## 2018-04-19 DIAGNOSIS — N2581 Secondary hyperparathyroidism of renal origin: Secondary | ICD-10-CM | POA: Diagnosis not present

## 2018-04-19 DIAGNOSIS — N186 End stage renal disease: Secondary | ICD-10-CM | POA: Diagnosis not present

## 2018-04-19 DIAGNOSIS — Z992 Dependence on renal dialysis: Secondary | ICD-10-CM | POA: Diagnosis not present

## 2018-04-19 DIAGNOSIS — D631 Anemia in chronic kidney disease: Secondary | ICD-10-CM | POA: Diagnosis not present

## 2018-04-20 DIAGNOSIS — D509 Iron deficiency anemia, unspecified: Secondary | ICD-10-CM | POA: Diagnosis not present

## 2018-04-20 DIAGNOSIS — N2581 Secondary hyperparathyroidism of renal origin: Secondary | ICD-10-CM | POA: Diagnosis not present

## 2018-04-20 DIAGNOSIS — D631 Anemia in chronic kidney disease: Secondary | ICD-10-CM | POA: Diagnosis not present

## 2018-04-20 DIAGNOSIS — N186 End stage renal disease: Secondary | ICD-10-CM | POA: Diagnosis not present

## 2018-04-20 DIAGNOSIS — Z992 Dependence on renal dialysis: Secondary | ICD-10-CM | POA: Diagnosis not present

## 2018-04-21 DIAGNOSIS — Z992 Dependence on renal dialysis: Secondary | ICD-10-CM | POA: Diagnosis not present

## 2018-04-21 DIAGNOSIS — N2581 Secondary hyperparathyroidism of renal origin: Secondary | ICD-10-CM | POA: Diagnosis not present

## 2018-04-21 DIAGNOSIS — D509 Iron deficiency anemia, unspecified: Secondary | ICD-10-CM | POA: Diagnosis not present

## 2018-04-21 DIAGNOSIS — N186 End stage renal disease: Secondary | ICD-10-CM | POA: Diagnosis not present

## 2018-04-21 DIAGNOSIS — D631 Anemia in chronic kidney disease: Secondary | ICD-10-CM | POA: Diagnosis not present

## 2018-04-22 DIAGNOSIS — N2581 Secondary hyperparathyroidism of renal origin: Secondary | ICD-10-CM | POA: Diagnosis not present

## 2018-04-22 DIAGNOSIS — D631 Anemia in chronic kidney disease: Secondary | ICD-10-CM | POA: Diagnosis not present

## 2018-04-22 DIAGNOSIS — D509 Iron deficiency anemia, unspecified: Secondary | ICD-10-CM | POA: Diagnosis not present

## 2018-04-22 DIAGNOSIS — N186 End stage renal disease: Secondary | ICD-10-CM | POA: Diagnosis not present

## 2018-04-22 DIAGNOSIS — Z992 Dependence on renal dialysis: Secondary | ICD-10-CM | POA: Diagnosis not present

## 2018-04-23 DIAGNOSIS — D631 Anemia in chronic kidney disease: Secondary | ICD-10-CM | POA: Diagnosis not present

## 2018-04-23 DIAGNOSIS — N186 End stage renal disease: Secondary | ICD-10-CM | POA: Diagnosis not present

## 2018-04-23 DIAGNOSIS — N2581 Secondary hyperparathyroidism of renal origin: Secondary | ICD-10-CM | POA: Diagnosis not present

## 2018-04-23 DIAGNOSIS — D509 Iron deficiency anemia, unspecified: Secondary | ICD-10-CM | POA: Diagnosis not present

## 2018-04-23 DIAGNOSIS — Z992 Dependence on renal dialysis: Secondary | ICD-10-CM | POA: Diagnosis not present

## 2018-04-24 DIAGNOSIS — Z992 Dependence on renal dialysis: Secondary | ICD-10-CM | POA: Diagnosis not present

## 2018-04-24 DIAGNOSIS — D631 Anemia in chronic kidney disease: Secondary | ICD-10-CM | POA: Diagnosis not present

## 2018-04-24 DIAGNOSIS — D509 Iron deficiency anemia, unspecified: Secondary | ICD-10-CM | POA: Diagnosis not present

## 2018-04-24 DIAGNOSIS — N2581 Secondary hyperparathyroidism of renal origin: Secondary | ICD-10-CM | POA: Diagnosis not present

## 2018-04-24 DIAGNOSIS — N186 End stage renal disease: Secondary | ICD-10-CM | POA: Diagnosis not present

## 2018-04-25 ENCOUNTER — Ambulatory Visit: Payer: Self-pay | Admitting: Cardiovascular Disease

## 2018-04-25 DIAGNOSIS — D509 Iron deficiency anemia, unspecified: Secondary | ICD-10-CM | POA: Diagnosis not present

## 2018-04-25 DIAGNOSIS — N2581 Secondary hyperparathyroidism of renal origin: Secondary | ICD-10-CM | POA: Diagnosis not present

## 2018-04-25 DIAGNOSIS — D631 Anemia in chronic kidney disease: Secondary | ICD-10-CM | POA: Diagnosis not present

## 2018-04-25 DIAGNOSIS — N186 End stage renal disease: Secondary | ICD-10-CM | POA: Diagnosis not present

## 2018-04-25 DIAGNOSIS — Z992 Dependence on renal dialysis: Secondary | ICD-10-CM | POA: Diagnosis not present

## 2018-04-26 DIAGNOSIS — N186 End stage renal disease: Secondary | ICD-10-CM | POA: Diagnosis not present

## 2018-04-26 DIAGNOSIS — D631 Anemia in chronic kidney disease: Secondary | ICD-10-CM | POA: Diagnosis not present

## 2018-04-26 DIAGNOSIS — D509 Iron deficiency anemia, unspecified: Secondary | ICD-10-CM | POA: Diagnosis not present

## 2018-04-26 DIAGNOSIS — N2581 Secondary hyperparathyroidism of renal origin: Secondary | ICD-10-CM | POA: Diagnosis not present

## 2018-04-26 DIAGNOSIS — Z992 Dependence on renal dialysis: Secondary | ICD-10-CM | POA: Diagnosis not present

## 2018-04-27 ENCOUNTER — Other Ambulatory Visit: Payer: Self-pay | Admitting: Neurological Surgery

## 2018-04-27 DIAGNOSIS — D631 Anemia in chronic kidney disease: Secondary | ICD-10-CM | POA: Diagnosis not present

## 2018-04-27 DIAGNOSIS — Z992 Dependence on renal dialysis: Secondary | ICD-10-CM | POA: Diagnosis not present

## 2018-04-27 DIAGNOSIS — N2581 Secondary hyperparathyroidism of renal origin: Secondary | ICD-10-CM | POA: Diagnosis not present

## 2018-04-27 DIAGNOSIS — D509 Iron deficiency anemia, unspecified: Secondary | ICD-10-CM | POA: Diagnosis not present

## 2018-04-27 DIAGNOSIS — M5481 Occipital neuralgia: Secondary | ICD-10-CM

## 2018-04-27 DIAGNOSIS — N186 End stage renal disease: Secondary | ICD-10-CM | POA: Diagnosis not present

## 2018-04-28 DIAGNOSIS — D631 Anemia in chronic kidney disease: Secondary | ICD-10-CM | POA: Diagnosis not present

## 2018-04-28 DIAGNOSIS — Z992 Dependence on renal dialysis: Secondary | ICD-10-CM | POA: Diagnosis not present

## 2018-04-28 DIAGNOSIS — N2581 Secondary hyperparathyroidism of renal origin: Secondary | ICD-10-CM | POA: Diagnosis not present

## 2018-04-28 DIAGNOSIS — D509 Iron deficiency anemia, unspecified: Secondary | ICD-10-CM | POA: Diagnosis not present

## 2018-04-28 DIAGNOSIS — N186 End stage renal disease: Secondary | ICD-10-CM | POA: Diagnosis not present

## 2018-04-29 DIAGNOSIS — D631 Anemia in chronic kidney disease: Secondary | ICD-10-CM | POA: Diagnosis not present

## 2018-04-29 DIAGNOSIS — Z992 Dependence on renal dialysis: Secondary | ICD-10-CM | POA: Diagnosis not present

## 2018-04-29 DIAGNOSIS — D509 Iron deficiency anemia, unspecified: Secondary | ICD-10-CM | POA: Diagnosis not present

## 2018-04-29 DIAGNOSIS — N2581 Secondary hyperparathyroidism of renal origin: Secondary | ICD-10-CM | POA: Diagnosis not present

## 2018-04-29 DIAGNOSIS — N186 End stage renal disease: Secondary | ICD-10-CM | POA: Diagnosis not present

## 2018-04-30 DIAGNOSIS — D509 Iron deficiency anemia, unspecified: Secondary | ICD-10-CM | POA: Diagnosis not present

## 2018-04-30 DIAGNOSIS — N186 End stage renal disease: Secondary | ICD-10-CM | POA: Diagnosis not present

## 2018-04-30 DIAGNOSIS — Z992 Dependence on renal dialysis: Secondary | ICD-10-CM | POA: Diagnosis not present

## 2018-04-30 DIAGNOSIS — D631 Anemia in chronic kidney disease: Secondary | ICD-10-CM | POA: Diagnosis not present

## 2018-04-30 DIAGNOSIS — N2581 Secondary hyperparathyroidism of renal origin: Secondary | ICD-10-CM | POA: Diagnosis not present

## 2018-05-01 DIAGNOSIS — Z992 Dependence on renal dialysis: Secondary | ICD-10-CM | POA: Diagnosis not present

## 2018-05-01 DIAGNOSIS — D509 Iron deficiency anemia, unspecified: Secondary | ICD-10-CM | POA: Diagnosis not present

## 2018-05-01 DIAGNOSIS — N2581 Secondary hyperparathyroidism of renal origin: Secondary | ICD-10-CM | POA: Diagnosis not present

## 2018-05-01 DIAGNOSIS — N186 End stage renal disease: Secondary | ICD-10-CM | POA: Diagnosis not present

## 2018-05-01 DIAGNOSIS — D631 Anemia in chronic kidney disease: Secondary | ICD-10-CM | POA: Diagnosis not present

## 2018-05-02 DIAGNOSIS — N186 End stage renal disease: Secondary | ICD-10-CM | POA: Diagnosis not present

## 2018-05-02 DIAGNOSIS — D509 Iron deficiency anemia, unspecified: Secondary | ICD-10-CM | POA: Diagnosis not present

## 2018-05-02 DIAGNOSIS — D631 Anemia in chronic kidney disease: Secondary | ICD-10-CM | POA: Diagnosis not present

## 2018-05-02 DIAGNOSIS — Z992 Dependence on renal dialysis: Secondary | ICD-10-CM | POA: Diagnosis not present

## 2018-05-02 DIAGNOSIS — N2581 Secondary hyperparathyroidism of renal origin: Secondary | ICD-10-CM | POA: Diagnosis not present

## 2018-05-03 DIAGNOSIS — N186 End stage renal disease: Secondary | ICD-10-CM | POA: Diagnosis not present

## 2018-05-03 DIAGNOSIS — D631 Anemia in chronic kidney disease: Secondary | ICD-10-CM | POA: Diagnosis not present

## 2018-05-03 DIAGNOSIS — Z992 Dependence on renal dialysis: Secondary | ICD-10-CM | POA: Diagnosis not present

## 2018-05-03 DIAGNOSIS — N2581 Secondary hyperparathyroidism of renal origin: Secondary | ICD-10-CM | POA: Diagnosis not present

## 2018-05-03 DIAGNOSIS — D509 Iron deficiency anemia, unspecified: Secondary | ICD-10-CM | POA: Diagnosis not present

## 2018-05-04 DIAGNOSIS — D509 Iron deficiency anemia, unspecified: Secondary | ICD-10-CM | POA: Diagnosis not present

## 2018-05-04 DIAGNOSIS — D631 Anemia in chronic kidney disease: Secondary | ICD-10-CM | POA: Diagnosis not present

## 2018-05-04 DIAGNOSIS — N2581 Secondary hyperparathyroidism of renal origin: Secondary | ICD-10-CM | POA: Diagnosis not present

## 2018-05-04 DIAGNOSIS — N186 End stage renal disease: Secondary | ICD-10-CM | POA: Diagnosis not present

## 2018-05-04 DIAGNOSIS — Z992 Dependence on renal dialysis: Secondary | ICD-10-CM | POA: Diagnosis not present

## 2018-05-05 DIAGNOSIS — D509 Iron deficiency anemia, unspecified: Secondary | ICD-10-CM | POA: Diagnosis not present

## 2018-05-05 DIAGNOSIS — N186 End stage renal disease: Secondary | ICD-10-CM | POA: Diagnosis not present

## 2018-05-05 DIAGNOSIS — D631 Anemia in chronic kidney disease: Secondary | ICD-10-CM | POA: Diagnosis not present

## 2018-05-05 DIAGNOSIS — Z992 Dependence on renal dialysis: Secondary | ICD-10-CM | POA: Diagnosis not present

## 2018-05-05 DIAGNOSIS — N2581 Secondary hyperparathyroidism of renal origin: Secondary | ICD-10-CM | POA: Diagnosis not present

## 2018-05-06 DIAGNOSIS — Z992 Dependence on renal dialysis: Secondary | ICD-10-CM | POA: Diagnosis not present

## 2018-05-06 DIAGNOSIS — N186 End stage renal disease: Secondary | ICD-10-CM | POA: Diagnosis not present

## 2018-05-06 DIAGNOSIS — D631 Anemia in chronic kidney disease: Secondary | ICD-10-CM | POA: Diagnosis not present

## 2018-05-06 DIAGNOSIS — N2581 Secondary hyperparathyroidism of renal origin: Secondary | ICD-10-CM | POA: Diagnosis not present

## 2018-05-06 DIAGNOSIS — D509 Iron deficiency anemia, unspecified: Secondary | ICD-10-CM | POA: Diagnosis not present

## 2018-05-07 ENCOUNTER — Ambulatory Visit
Admission: RE | Admit: 2018-05-07 | Discharge: 2018-05-07 | Disposition: A | Discharge status: Home / Self Care | Payer: Medicare Other | Source: Ambulatory Visit | Attending: Neurological Surgery | Admitting: Neurological Surgery

## 2018-05-07 ENCOUNTER — Other Ambulatory Visit: Payer: Self-pay | Admitting: Neurological Surgery

## 2018-05-07 DIAGNOSIS — D509 Iron deficiency anemia, unspecified: Secondary | ICD-10-CM | POA: Diagnosis not present

## 2018-05-07 DIAGNOSIS — N186 End stage renal disease: Secondary | ICD-10-CM | POA: Diagnosis not present

## 2018-05-07 DIAGNOSIS — D631 Anemia in chronic kidney disease: Secondary | ICD-10-CM | POA: Diagnosis not present

## 2018-05-07 DIAGNOSIS — Z992 Dependence on renal dialysis: Secondary | ICD-10-CM | POA: Diagnosis not present

## 2018-05-07 DIAGNOSIS — M47812 Spondylosis without myelopathy or radiculopathy, cervical region: Secondary | ICD-10-CM | POA: Diagnosis not present

## 2018-05-07 DIAGNOSIS — N2581 Secondary hyperparathyroidism of renal origin: Secondary | ICD-10-CM | POA: Diagnosis not present

## 2018-05-07 DIAGNOSIS — M5481 Occipital neuralgia: Secondary | ICD-10-CM

## 2018-05-07 MED ORDER — DEXAMETHASONE SODIUM PHOSPHATE 4 MG/ML IJ SOLN: 8.0000 mg | Freq: Once | INTRAMUSCULAR | Status: DC

## 2018-05-07 NOTE — Discharge Instructions (Signed)

## 2018-05-08 DIAGNOSIS — N2581 Secondary hyperparathyroidism of renal origin: Secondary | ICD-10-CM | POA: Diagnosis not present

## 2018-05-08 DIAGNOSIS — Z992 Dependence on renal dialysis: Secondary | ICD-10-CM | POA: Diagnosis not present

## 2018-05-08 DIAGNOSIS — D3131 Benign neoplasm of right choroid: Secondary | ICD-10-CM | POA: Diagnosis not present

## 2018-05-08 DIAGNOSIS — Z961 Presence of intraocular lens: Secondary | ICD-10-CM | POA: Diagnosis not present

## 2018-05-08 DIAGNOSIS — D509 Iron deficiency anemia, unspecified: Secondary | ICD-10-CM | POA: Diagnosis not present

## 2018-05-08 DIAGNOSIS — N186 End stage renal disease: Secondary | ICD-10-CM | POA: Diagnosis not present

## 2018-05-08 DIAGNOSIS — H35033 Hypertensive retinopathy, bilateral: Secondary | ICD-10-CM | POA: Diagnosis not present

## 2018-05-08 DIAGNOSIS — H40013 Open angle with borderline findings, low risk, bilateral: Secondary | ICD-10-CM | POA: Diagnosis not present

## 2018-05-08 DIAGNOSIS — D631 Anemia in chronic kidney disease: Secondary | ICD-10-CM | POA: Diagnosis not present

## 2018-05-09 DIAGNOSIS — Z992 Dependence on renal dialysis: Secondary | ICD-10-CM | POA: Diagnosis not present

## 2018-05-09 DIAGNOSIS — N186 End stage renal disease: Secondary | ICD-10-CM | POA: Diagnosis not present

## 2018-05-09 DIAGNOSIS — D509 Iron deficiency anemia, unspecified: Secondary | ICD-10-CM | POA: Diagnosis not present

## 2018-05-09 DIAGNOSIS — N2581 Secondary hyperparathyroidism of renal origin: Secondary | ICD-10-CM | POA: Diagnosis not present

## 2018-05-09 DIAGNOSIS — D631 Anemia in chronic kidney disease: Secondary | ICD-10-CM | POA: Diagnosis not present

## 2018-05-10 DIAGNOSIS — Z992 Dependence on renal dialysis: Secondary | ICD-10-CM | POA: Diagnosis not present

## 2018-05-10 DIAGNOSIS — D631 Anemia in chronic kidney disease: Secondary | ICD-10-CM | POA: Diagnosis not present

## 2018-05-10 DIAGNOSIS — D509 Iron deficiency anemia, unspecified: Secondary | ICD-10-CM | POA: Diagnosis not present

## 2018-05-10 DIAGNOSIS — N2581 Secondary hyperparathyroidism of renal origin: Secondary | ICD-10-CM | POA: Diagnosis not present

## 2018-05-10 DIAGNOSIS — N186 End stage renal disease: Secondary | ICD-10-CM | POA: Diagnosis not present

## 2018-05-11 DIAGNOSIS — N186 End stage renal disease: Secondary | ICD-10-CM | POA: Diagnosis not present

## 2018-05-11 DIAGNOSIS — D631 Anemia in chronic kidney disease: Secondary | ICD-10-CM | POA: Diagnosis not present

## 2018-05-11 DIAGNOSIS — Z992 Dependence on renal dialysis: Secondary | ICD-10-CM | POA: Diagnosis not present

## 2018-05-11 DIAGNOSIS — D509 Iron deficiency anemia, unspecified: Secondary | ICD-10-CM | POA: Diagnosis not present

## 2018-05-11 DIAGNOSIS — N2581 Secondary hyperparathyroidism of renal origin: Secondary | ICD-10-CM | POA: Diagnosis not present

## 2018-05-12 DIAGNOSIS — N186 End stage renal disease: Secondary | ICD-10-CM | POA: Diagnosis not present

## 2018-05-12 DIAGNOSIS — N2581 Secondary hyperparathyroidism of renal origin: Secondary | ICD-10-CM | POA: Diagnosis not present

## 2018-05-12 DIAGNOSIS — Z992 Dependence on renal dialysis: Secondary | ICD-10-CM | POA: Diagnosis not present

## 2018-05-12 DIAGNOSIS — D631 Anemia in chronic kidney disease: Secondary | ICD-10-CM | POA: Diagnosis not present

## 2018-05-12 DIAGNOSIS — D509 Iron deficiency anemia, unspecified: Secondary | ICD-10-CM | POA: Diagnosis not present

## 2018-05-13 DIAGNOSIS — D631 Anemia in chronic kidney disease: Secondary | ICD-10-CM | POA: Diagnosis not present

## 2018-05-13 DIAGNOSIS — N186 End stage renal disease: Secondary | ICD-10-CM | POA: Diagnosis not present

## 2018-05-13 DIAGNOSIS — N2581 Secondary hyperparathyroidism of renal origin: Secondary | ICD-10-CM | POA: Diagnosis not present

## 2018-05-13 DIAGNOSIS — Z992 Dependence on renal dialysis: Secondary | ICD-10-CM | POA: Diagnosis not present

## 2018-05-13 DIAGNOSIS — D509 Iron deficiency anemia, unspecified: Secondary | ICD-10-CM | POA: Diagnosis not present

## 2018-05-14 DIAGNOSIS — D631 Anemia in chronic kidney disease: Secondary | ICD-10-CM | POA: Diagnosis not present

## 2018-05-14 DIAGNOSIS — Z992 Dependence on renal dialysis: Secondary | ICD-10-CM | POA: Diagnosis not present

## 2018-05-14 DIAGNOSIS — D509 Iron deficiency anemia, unspecified: Secondary | ICD-10-CM | POA: Diagnosis not present

## 2018-05-14 DIAGNOSIS — N2581 Secondary hyperparathyroidism of renal origin: Secondary | ICD-10-CM | POA: Diagnosis not present

## 2018-05-14 DIAGNOSIS — N186 End stage renal disease: Secondary | ICD-10-CM | POA: Diagnosis not present

## 2018-05-15 DIAGNOSIS — N2581 Secondary hyperparathyroidism of renal origin: Secondary | ICD-10-CM | POA: Diagnosis not present

## 2018-05-15 DIAGNOSIS — Z992 Dependence on renal dialysis: Secondary | ICD-10-CM | POA: Diagnosis not present

## 2018-05-15 DIAGNOSIS — G894 Chronic pain syndrome: Secondary | ICD-10-CM | POA: Diagnosis not present

## 2018-05-15 DIAGNOSIS — Z85528 Personal history of other malignant neoplasm of kidney: Secondary | ICD-10-CM | POA: Diagnosis not present

## 2018-05-15 DIAGNOSIS — I10 Essential (primary) hypertension: Secondary | ICD-10-CM | POA: Diagnosis not present

## 2018-05-15 DIAGNOSIS — F411 Generalized anxiety disorder: Secondary | ICD-10-CM | POA: Diagnosis not present

## 2018-05-15 DIAGNOSIS — I953 Hypotension of hemodialysis: Secondary | ICD-10-CM | POA: Diagnosis not present

## 2018-05-15 DIAGNOSIS — R5382 Chronic fatigue, unspecified: Secondary | ICD-10-CM | POA: Diagnosis not present

## 2018-05-15 DIAGNOSIS — D509 Iron deficiency anemia, unspecified: Secondary | ICD-10-CM | POA: Diagnosis not present

## 2018-05-15 DIAGNOSIS — D649 Anemia, unspecified: Secondary | ICD-10-CM | POA: Diagnosis not present

## 2018-05-15 DIAGNOSIS — R0602 Shortness of breath: Secondary | ICD-10-CM | POA: Diagnosis not present

## 2018-05-15 DIAGNOSIS — N186 End stage renal disease: Secondary | ICD-10-CM | POA: Diagnosis not present

## 2018-05-15 DIAGNOSIS — D631 Anemia in chronic kidney disease: Secondary | ICD-10-CM | POA: Diagnosis not present

## 2018-05-15 DIAGNOSIS — D508 Other iron deficiency anemias: Secondary | ICD-10-CM | POA: Diagnosis not present

## 2018-05-16 DIAGNOSIS — Z992 Dependence on renal dialysis: Secondary | ICD-10-CM | POA: Diagnosis not present

## 2018-05-16 DIAGNOSIS — D631 Anemia in chronic kidney disease: Secondary | ICD-10-CM | POA: Diagnosis not present

## 2018-05-16 DIAGNOSIS — N186 End stage renal disease: Secondary | ICD-10-CM | POA: Diagnosis not present

## 2018-05-16 DIAGNOSIS — N2581 Secondary hyperparathyroidism of renal origin: Secondary | ICD-10-CM | POA: Diagnosis not present

## 2018-05-16 DIAGNOSIS — D509 Iron deficiency anemia, unspecified: Secondary | ICD-10-CM | POA: Diagnosis not present

## 2018-05-17 DIAGNOSIS — N2581 Secondary hyperparathyroidism of renal origin: Secondary | ICD-10-CM | POA: Diagnosis not present

## 2018-05-17 DIAGNOSIS — Z992 Dependence on renal dialysis: Secondary | ICD-10-CM | POA: Diagnosis not present

## 2018-05-17 DIAGNOSIS — D631 Anemia in chronic kidney disease: Secondary | ICD-10-CM | POA: Diagnosis not present

## 2018-05-17 DIAGNOSIS — D509 Iron deficiency anemia, unspecified: Secondary | ICD-10-CM | POA: Diagnosis not present

## 2018-05-17 DIAGNOSIS — N186 End stage renal disease: Secondary | ICD-10-CM | POA: Diagnosis not present

## 2018-05-18 DIAGNOSIS — N2581 Secondary hyperparathyroidism of renal origin: Secondary | ICD-10-CM | POA: Diagnosis not present

## 2018-05-18 DIAGNOSIS — D631 Anemia in chronic kidney disease: Secondary | ICD-10-CM | POA: Diagnosis not present

## 2018-05-18 DIAGNOSIS — N186 End stage renal disease: Secondary | ICD-10-CM | POA: Diagnosis not present

## 2018-05-18 DIAGNOSIS — Z992 Dependence on renal dialysis: Secondary | ICD-10-CM | POA: Diagnosis not present

## 2018-05-18 DIAGNOSIS — D509 Iron deficiency anemia, unspecified: Secondary | ICD-10-CM | POA: Diagnosis not present

## 2018-05-19 DIAGNOSIS — N2581 Secondary hyperparathyroidism of renal origin: Secondary | ICD-10-CM | POA: Diagnosis not present

## 2018-05-19 DIAGNOSIS — D631 Anemia in chronic kidney disease: Secondary | ICD-10-CM | POA: Diagnosis not present

## 2018-05-19 DIAGNOSIS — Z992 Dependence on renal dialysis: Secondary | ICD-10-CM | POA: Diagnosis not present

## 2018-05-19 DIAGNOSIS — D509 Iron deficiency anemia, unspecified: Secondary | ICD-10-CM | POA: Diagnosis not present

## 2018-05-19 DIAGNOSIS — N186 End stage renal disease: Secondary | ICD-10-CM | POA: Diagnosis not present

## 2018-05-20 DIAGNOSIS — D631 Anemia in chronic kidney disease: Secondary | ICD-10-CM | POA: Diagnosis not present

## 2018-05-20 DIAGNOSIS — D509 Iron deficiency anemia, unspecified: Secondary | ICD-10-CM | POA: Diagnosis not present

## 2018-05-20 DIAGNOSIS — N2581 Secondary hyperparathyroidism of renal origin: Secondary | ICD-10-CM | POA: Diagnosis not present

## 2018-05-20 DIAGNOSIS — Z992 Dependence on renal dialysis: Secondary | ICD-10-CM | POA: Diagnosis not present

## 2018-05-20 DIAGNOSIS — N186 End stage renal disease: Secondary | ICD-10-CM | POA: Diagnosis not present

## 2018-05-21 DIAGNOSIS — N2581 Secondary hyperparathyroidism of renal origin: Secondary | ICD-10-CM | POA: Diagnosis not present

## 2018-05-21 DIAGNOSIS — Z992 Dependence on renal dialysis: Secondary | ICD-10-CM | POA: Diagnosis not present

## 2018-05-21 DIAGNOSIS — D509 Iron deficiency anemia, unspecified: Secondary | ICD-10-CM | POA: Diagnosis not present

## 2018-05-21 DIAGNOSIS — N186 End stage renal disease: Secondary | ICD-10-CM | POA: Diagnosis not present

## 2018-05-21 DIAGNOSIS — D631 Anemia in chronic kidney disease: Secondary | ICD-10-CM | POA: Diagnosis not present

## 2018-05-22 DIAGNOSIS — N186 End stage renal disease: Secondary | ICD-10-CM | POA: Diagnosis not present

## 2018-05-22 DIAGNOSIS — D631 Anemia in chronic kidney disease: Secondary | ICD-10-CM | POA: Diagnosis not present

## 2018-05-22 DIAGNOSIS — N2581 Secondary hyperparathyroidism of renal origin: Secondary | ICD-10-CM | POA: Diagnosis not present

## 2018-05-22 DIAGNOSIS — D509 Iron deficiency anemia, unspecified: Secondary | ICD-10-CM | POA: Diagnosis not present

## 2018-05-22 DIAGNOSIS — Z992 Dependence on renal dialysis: Secondary | ICD-10-CM | POA: Diagnosis not present

## 2018-05-23 DIAGNOSIS — D509 Iron deficiency anemia, unspecified: Secondary | ICD-10-CM | POA: Diagnosis not present

## 2018-05-23 DIAGNOSIS — N2581 Secondary hyperparathyroidism of renal origin: Secondary | ICD-10-CM | POA: Diagnosis not present

## 2018-05-23 DIAGNOSIS — D631 Anemia in chronic kidney disease: Secondary | ICD-10-CM | POA: Diagnosis not present

## 2018-05-23 DIAGNOSIS — Z992 Dependence on renal dialysis: Secondary | ICD-10-CM | POA: Diagnosis not present

## 2018-05-23 DIAGNOSIS — N186 End stage renal disease: Secondary | ICD-10-CM | POA: Diagnosis not present

## 2018-05-24 DIAGNOSIS — N186 End stage renal disease: Secondary | ICD-10-CM | POA: Diagnosis not present

## 2018-05-24 DIAGNOSIS — D631 Anemia in chronic kidney disease: Secondary | ICD-10-CM | POA: Diagnosis not present

## 2018-05-24 DIAGNOSIS — N2581 Secondary hyperparathyroidism of renal origin: Secondary | ICD-10-CM | POA: Diagnosis not present

## 2018-05-24 DIAGNOSIS — D509 Iron deficiency anemia, unspecified: Secondary | ICD-10-CM | POA: Diagnosis not present

## 2018-05-24 DIAGNOSIS — Z992 Dependence on renal dialysis: Secondary | ICD-10-CM | POA: Diagnosis not present

## 2018-05-25 DIAGNOSIS — D631 Anemia in chronic kidney disease: Secondary | ICD-10-CM | POA: Diagnosis not present

## 2018-05-25 DIAGNOSIS — Z992 Dependence on renal dialysis: Secondary | ICD-10-CM | POA: Diagnosis not present

## 2018-05-25 DIAGNOSIS — N186 End stage renal disease: Secondary | ICD-10-CM | POA: Diagnosis not present

## 2018-05-25 DIAGNOSIS — D509 Iron deficiency anemia, unspecified: Secondary | ICD-10-CM | POA: Diagnosis not present

## 2018-05-25 DIAGNOSIS — N2581 Secondary hyperparathyroidism of renal origin: Secondary | ICD-10-CM | POA: Diagnosis not present

## 2018-05-26 DIAGNOSIS — D509 Iron deficiency anemia, unspecified: Secondary | ICD-10-CM | POA: Diagnosis not present

## 2018-05-26 DIAGNOSIS — Z992 Dependence on renal dialysis: Secondary | ICD-10-CM | POA: Diagnosis not present

## 2018-05-26 DIAGNOSIS — N2581 Secondary hyperparathyroidism of renal origin: Secondary | ICD-10-CM | POA: Diagnosis not present

## 2018-05-26 DIAGNOSIS — N186 End stage renal disease: Secondary | ICD-10-CM | POA: Diagnosis not present

## 2018-05-26 DIAGNOSIS — D631 Anemia in chronic kidney disease: Secondary | ICD-10-CM | POA: Diagnosis not present

## 2018-05-27 DIAGNOSIS — N186 End stage renal disease: Secondary | ICD-10-CM | POA: Diagnosis not present

## 2018-05-27 DIAGNOSIS — N2581 Secondary hyperparathyroidism of renal origin: Secondary | ICD-10-CM | POA: Diagnosis not present

## 2018-05-27 DIAGNOSIS — Z992 Dependence on renal dialysis: Secondary | ICD-10-CM | POA: Diagnosis not present

## 2018-05-27 DIAGNOSIS — D631 Anemia in chronic kidney disease: Secondary | ICD-10-CM | POA: Diagnosis not present

## 2018-05-27 DIAGNOSIS — D509 Iron deficiency anemia, unspecified: Secondary | ICD-10-CM | POA: Diagnosis not present

## 2018-05-28 DIAGNOSIS — D509 Iron deficiency anemia, unspecified: Secondary | ICD-10-CM | POA: Diagnosis not present

## 2018-05-28 DIAGNOSIS — N2581 Secondary hyperparathyroidism of renal origin: Secondary | ICD-10-CM | POA: Diagnosis not present

## 2018-05-28 DIAGNOSIS — D631 Anemia in chronic kidney disease: Secondary | ICD-10-CM | POA: Diagnosis not present

## 2018-05-28 DIAGNOSIS — N186 End stage renal disease: Secondary | ICD-10-CM | POA: Diagnosis not present

## 2018-05-28 DIAGNOSIS — Z992 Dependence on renal dialysis: Secondary | ICD-10-CM | POA: Diagnosis not present

## 2018-05-29 DIAGNOSIS — N186 End stage renal disease: Secondary | ICD-10-CM | POA: Diagnosis not present

## 2018-05-29 DIAGNOSIS — I739 Peripheral vascular disease, unspecified: Secondary | ICD-10-CM | POA: Diagnosis not present

## 2018-05-29 DIAGNOSIS — M79672 Pain in left foot: Secondary | ICD-10-CM | POA: Diagnosis not present

## 2018-05-29 DIAGNOSIS — L110 Acquired keratosis follicularis: Secondary | ICD-10-CM | POA: Diagnosis not present

## 2018-05-29 DIAGNOSIS — M79671 Pain in right foot: Secondary | ICD-10-CM | POA: Diagnosis not present

## 2018-05-29 DIAGNOSIS — Z992 Dependence on renal dialysis: Secondary | ICD-10-CM | POA: Diagnosis not present

## 2018-05-29 DIAGNOSIS — D631 Anemia in chronic kidney disease: Secondary | ICD-10-CM | POA: Diagnosis not present

## 2018-05-29 DIAGNOSIS — D509 Iron deficiency anemia, unspecified: Secondary | ICD-10-CM | POA: Diagnosis not present

## 2018-05-29 DIAGNOSIS — N2581 Secondary hyperparathyroidism of renal origin: Secondary | ICD-10-CM | POA: Diagnosis not present

## 2018-05-30 DIAGNOSIS — N186 End stage renal disease: Secondary | ICD-10-CM | POA: Diagnosis not present

## 2018-05-30 DIAGNOSIS — D509 Iron deficiency anemia, unspecified: Secondary | ICD-10-CM | POA: Diagnosis not present

## 2018-05-30 DIAGNOSIS — D631 Anemia in chronic kidney disease: Secondary | ICD-10-CM | POA: Diagnosis not present

## 2018-05-30 DIAGNOSIS — Z992 Dependence on renal dialysis: Secondary | ICD-10-CM | POA: Diagnosis not present

## 2018-05-30 DIAGNOSIS — N2581 Secondary hyperparathyroidism of renal origin: Secondary | ICD-10-CM | POA: Diagnosis not present

## 2018-05-31 DIAGNOSIS — Z992 Dependence on renal dialysis: Secondary | ICD-10-CM | POA: Diagnosis not present

## 2018-05-31 DIAGNOSIS — D509 Iron deficiency anemia, unspecified: Secondary | ICD-10-CM | POA: Diagnosis not present

## 2018-05-31 DIAGNOSIS — N186 End stage renal disease: Secondary | ICD-10-CM | POA: Diagnosis not present

## 2018-05-31 DIAGNOSIS — D631 Anemia in chronic kidney disease: Secondary | ICD-10-CM | POA: Diagnosis not present

## 2018-05-31 DIAGNOSIS — N2581 Secondary hyperparathyroidism of renal origin: Secondary | ICD-10-CM | POA: Diagnosis not present

## 2018-06-01 DIAGNOSIS — N2581 Secondary hyperparathyroidism of renal origin: Secondary | ICD-10-CM | POA: Diagnosis not present

## 2018-06-01 DIAGNOSIS — N186 End stage renal disease: Secondary | ICD-10-CM | POA: Diagnosis not present

## 2018-06-01 DIAGNOSIS — D509 Iron deficiency anemia, unspecified: Secondary | ICD-10-CM | POA: Diagnosis not present

## 2018-06-01 DIAGNOSIS — Z992 Dependence on renal dialysis: Secondary | ICD-10-CM | POA: Diagnosis not present

## 2018-06-01 DIAGNOSIS — D631 Anemia in chronic kidney disease: Secondary | ICD-10-CM | POA: Diagnosis not present

## 2018-06-02 DIAGNOSIS — Z992 Dependence on renal dialysis: Secondary | ICD-10-CM | POA: Diagnosis not present

## 2018-06-02 DIAGNOSIS — D509 Iron deficiency anemia, unspecified: Secondary | ICD-10-CM | POA: Diagnosis not present

## 2018-06-02 DIAGNOSIS — N186 End stage renal disease: Secondary | ICD-10-CM | POA: Diagnosis not present

## 2018-06-02 DIAGNOSIS — N2581 Secondary hyperparathyroidism of renal origin: Secondary | ICD-10-CM | POA: Diagnosis not present

## 2018-06-02 DIAGNOSIS — D631 Anemia in chronic kidney disease: Secondary | ICD-10-CM | POA: Diagnosis not present

## 2018-06-03 DIAGNOSIS — N2581 Secondary hyperparathyroidism of renal origin: Secondary | ICD-10-CM | POA: Diagnosis not present

## 2018-06-03 DIAGNOSIS — N186 End stage renal disease: Secondary | ICD-10-CM | POA: Diagnosis not present

## 2018-06-03 DIAGNOSIS — Z992 Dependence on renal dialysis: Secondary | ICD-10-CM | POA: Diagnosis not present

## 2018-06-03 DIAGNOSIS — D631 Anemia in chronic kidney disease: Secondary | ICD-10-CM | POA: Diagnosis not present

## 2018-06-03 DIAGNOSIS — D509 Iron deficiency anemia, unspecified: Secondary | ICD-10-CM | POA: Diagnosis not present

## 2018-06-04 DIAGNOSIS — N2581 Secondary hyperparathyroidism of renal origin: Secondary | ICD-10-CM | POA: Diagnosis not present

## 2018-06-04 DIAGNOSIS — Z992 Dependence on renal dialysis: Secondary | ICD-10-CM | POA: Diagnosis not present

## 2018-06-04 DIAGNOSIS — D631 Anemia in chronic kidney disease: Secondary | ICD-10-CM | POA: Diagnosis not present

## 2018-06-04 DIAGNOSIS — D509 Iron deficiency anemia, unspecified: Secondary | ICD-10-CM | POA: Diagnosis not present

## 2018-06-04 DIAGNOSIS — N186 End stage renal disease: Secondary | ICD-10-CM | POA: Diagnosis not present

## 2018-06-05 ENCOUNTER — Other Ambulatory Visit: Payer: Self-pay | Admitting: Internal Medicine

## 2018-06-05 DIAGNOSIS — Z992 Dependence on renal dialysis: Secondary | ICD-10-CM | POA: Diagnosis not present

## 2018-06-05 DIAGNOSIS — D631 Anemia in chronic kidney disease: Secondary | ICD-10-CM | POA: Diagnosis not present

## 2018-06-05 DIAGNOSIS — D509 Iron deficiency anemia, unspecified: Secondary | ICD-10-CM | POA: Diagnosis not present

## 2018-06-05 DIAGNOSIS — N2581 Secondary hyperparathyroidism of renal origin: Secondary | ICD-10-CM | POA: Diagnosis not present

## 2018-06-05 DIAGNOSIS — N186 End stage renal disease: Secondary | ICD-10-CM | POA: Diagnosis not present

## 2018-06-06 DIAGNOSIS — N2581 Secondary hyperparathyroidism of renal origin: Secondary | ICD-10-CM | POA: Diagnosis not present

## 2018-06-06 DIAGNOSIS — D509 Iron deficiency anemia, unspecified: Secondary | ICD-10-CM | POA: Diagnosis not present

## 2018-06-06 DIAGNOSIS — Z992 Dependence on renal dialysis: Secondary | ICD-10-CM | POA: Diagnosis not present

## 2018-06-06 DIAGNOSIS — N186 End stage renal disease: Secondary | ICD-10-CM | POA: Diagnosis not present

## 2018-06-06 DIAGNOSIS — D631 Anemia in chronic kidney disease: Secondary | ICD-10-CM | POA: Diagnosis not present

## 2018-06-07 DIAGNOSIS — N2581 Secondary hyperparathyroidism of renal origin: Secondary | ICD-10-CM | POA: Diagnosis not present

## 2018-06-07 DIAGNOSIS — N186 End stage renal disease: Secondary | ICD-10-CM | POA: Diagnosis not present

## 2018-06-07 DIAGNOSIS — D631 Anemia in chronic kidney disease: Secondary | ICD-10-CM | POA: Diagnosis not present

## 2018-06-07 DIAGNOSIS — D509 Iron deficiency anemia, unspecified: Secondary | ICD-10-CM | POA: Diagnosis not present

## 2018-06-07 DIAGNOSIS — Z992 Dependence on renal dialysis: Secondary | ICD-10-CM | POA: Diagnosis not present

## 2018-06-08 DIAGNOSIS — N2581 Secondary hyperparathyroidism of renal origin: Secondary | ICD-10-CM | POA: Diagnosis not present

## 2018-06-08 DIAGNOSIS — Z992 Dependence on renal dialysis: Secondary | ICD-10-CM | POA: Diagnosis not present

## 2018-06-08 DIAGNOSIS — N186 End stage renal disease: Secondary | ICD-10-CM | POA: Diagnosis not present

## 2018-06-08 DIAGNOSIS — D631 Anemia in chronic kidney disease: Secondary | ICD-10-CM | POA: Diagnosis not present

## 2018-06-08 DIAGNOSIS — D509 Iron deficiency anemia, unspecified: Secondary | ICD-10-CM | POA: Diagnosis not present

## 2018-06-09 DIAGNOSIS — D509 Iron deficiency anemia, unspecified: Secondary | ICD-10-CM | POA: Diagnosis not present

## 2018-06-09 DIAGNOSIS — N186 End stage renal disease: Secondary | ICD-10-CM | POA: Diagnosis not present

## 2018-06-09 DIAGNOSIS — N2581 Secondary hyperparathyroidism of renal origin: Secondary | ICD-10-CM | POA: Diagnosis not present

## 2018-06-09 DIAGNOSIS — D631 Anemia in chronic kidney disease: Secondary | ICD-10-CM | POA: Diagnosis not present

## 2018-06-09 DIAGNOSIS — Z992 Dependence on renal dialysis: Secondary | ICD-10-CM | POA: Diagnosis not present

## 2018-06-10 DIAGNOSIS — D631 Anemia in chronic kidney disease: Secondary | ICD-10-CM | POA: Diagnosis not present

## 2018-06-10 DIAGNOSIS — D509 Iron deficiency anemia, unspecified: Secondary | ICD-10-CM | POA: Diagnosis not present

## 2018-06-10 DIAGNOSIS — N2581 Secondary hyperparathyroidism of renal origin: Secondary | ICD-10-CM | POA: Diagnosis not present

## 2018-06-10 DIAGNOSIS — N186 End stage renal disease: Secondary | ICD-10-CM | POA: Diagnosis not present

## 2018-06-10 DIAGNOSIS — Z992 Dependence on renal dialysis: Secondary | ICD-10-CM | POA: Diagnosis not present

## 2018-06-11 DIAGNOSIS — N186 End stage renal disease: Secondary | ICD-10-CM | POA: Diagnosis not present

## 2018-06-11 DIAGNOSIS — Z992 Dependence on renal dialysis: Secondary | ICD-10-CM | POA: Diagnosis not present

## 2018-06-11 DIAGNOSIS — D509 Iron deficiency anemia, unspecified: Secondary | ICD-10-CM | POA: Diagnosis not present

## 2018-06-11 DIAGNOSIS — N2581 Secondary hyperparathyroidism of renal origin: Secondary | ICD-10-CM | POA: Diagnosis not present

## 2018-06-11 DIAGNOSIS — D631 Anemia in chronic kidney disease: Secondary | ICD-10-CM | POA: Diagnosis not present

## 2018-06-12 DIAGNOSIS — Z992 Dependence on renal dialysis: Secondary | ICD-10-CM | POA: Diagnosis not present

## 2018-06-12 DIAGNOSIS — N186 End stage renal disease: Secondary | ICD-10-CM | POA: Diagnosis not present

## 2018-06-12 DIAGNOSIS — D631 Anemia in chronic kidney disease: Secondary | ICD-10-CM | POA: Diagnosis not present

## 2018-06-12 DIAGNOSIS — N2581 Secondary hyperparathyroidism of renal origin: Secondary | ICD-10-CM | POA: Diagnosis not present

## 2018-06-12 DIAGNOSIS — D509 Iron deficiency anemia, unspecified: Secondary | ICD-10-CM | POA: Diagnosis not present

## 2018-06-13 DIAGNOSIS — N186 End stage renal disease: Secondary | ICD-10-CM | POA: Diagnosis not present

## 2018-06-13 DIAGNOSIS — Z992 Dependence on renal dialysis: Secondary | ICD-10-CM | POA: Diagnosis not present

## 2018-06-13 DIAGNOSIS — D631 Anemia in chronic kidney disease: Secondary | ICD-10-CM | POA: Diagnosis not present

## 2018-06-13 DIAGNOSIS — D509 Iron deficiency anemia, unspecified: Secondary | ICD-10-CM | POA: Diagnosis not present

## 2018-06-13 DIAGNOSIS — N2581 Secondary hyperparathyroidism of renal origin: Secondary | ICD-10-CM | POA: Diagnosis not present

## 2018-06-14 DIAGNOSIS — N2581 Secondary hyperparathyroidism of renal origin: Secondary | ICD-10-CM | POA: Diagnosis not present

## 2018-06-14 DIAGNOSIS — D631 Anemia in chronic kidney disease: Secondary | ICD-10-CM | POA: Diagnosis not present

## 2018-06-14 DIAGNOSIS — Z992 Dependence on renal dialysis: Secondary | ICD-10-CM | POA: Diagnosis not present

## 2018-06-14 DIAGNOSIS — D509 Iron deficiency anemia, unspecified: Secondary | ICD-10-CM | POA: Diagnosis not present

## 2018-06-14 DIAGNOSIS — N186 End stage renal disease: Secondary | ICD-10-CM | POA: Diagnosis not present

## 2018-06-15 DIAGNOSIS — N2581 Secondary hyperparathyroidism of renal origin: Secondary | ICD-10-CM | POA: Diagnosis not present

## 2018-06-15 DIAGNOSIS — Z992 Dependence on renal dialysis: Secondary | ICD-10-CM | POA: Diagnosis not present

## 2018-06-15 DIAGNOSIS — D509 Iron deficiency anemia, unspecified: Secondary | ICD-10-CM | POA: Diagnosis not present

## 2018-06-15 DIAGNOSIS — D631 Anemia in chronic kidney disease: Secondary | ICD-10-CM | POA: Diagnosis not present

## 2018-06-15 DIAGNOSIS — N186 End stage renal disease: Secondary | ICD-10-CM | POA: Diagnosis not present

## 2018-06-16 DIAGNOSIS — N186 End stage renal disease: Secondary | ICD-10-CM | POA: Diagnosis not present

## 2018-06-16 DIAGNOSIS — Z992 Dependence on renal dialysis: Secondary | ICD-10-CM | POA: Diagnosis not present

## 2018-06-16 DIAGNOSIS — D509 Iron deficiency anemia, unspecified: Secondary | ICD-10-CM | POA: Diagnosis not present

## 2018-06-16 DIAGNOSIS — D631 Anemia in chronic kidney disease: Secondary | ICD-10-CM | POA: Diagnosis not present

## 2018-06-16 DIAGNOSIS — N2581 Secondary hyperparathyroidism of renal origin: Secondary | ICD-10-CM | POA: Diagnosis not present

## 2018-06-17 DIAGNOSIS — N2581 Secondary hyperparathyroidism of renal origin: Secondary | ICD-10-CM | POA: Diagnosis not present

## 2018-06-17 DIAGNOSIS — D509 Iron deficiency anemia, unspecified: Secondary | ICD-10-CM | POA: Diagnosis not present

## 2018-06-17 DIAGNOSIS — Z992 Dependence on renal dialysis: Secondary | ICD-10-CM | POA: Diagnosis not present

## 2018-06-17 DIAGNOSIS — N186 End stage renal disease: Secondary | ICD-10-CM | POA: Diagnosis not present

## 2018-06-17 DIAGNOSIS — D631 Anemia in chronic kidney disease: Secondary | ICD-10-CM | POA: Diagnosis not present

## 2018-06-18 DIAGNOSIS — D509 Iron deficiency anemia, unspecified: Secondary | ICD-10-CM | POA: Diagnosis not present

## 2018-06-18 DIAGNOSIS — D631 Anemia in chronic kidney disease: Secondary | ICD-10-CM | POA: Diagnosis not present

## 2018-06-18 DIAGNOSIS — N2581 Secondary hyperparathyroidism of renal origin: Secondary | ICD-10-CM | POA: Diagnosis not present

## 2018-06-18 DIAGNOSIS — N186 End stage renal disease: Secondary | ICD-10-CM | POA: Diagnosis not present

## 2018-06-18 DIAGNOSIS — Z992 Dependence on renal dialysis: Secondary | ICD-10-CM | POA: Diagnosis not present

## 2018-06-19 DIAGNOSIS — D509 Iron deficiency anemia, unspecified: Secondary | ICD-10-CM | POA: Diagnosis not present

## 2018-06-19 DIAGNOSIS — D631 Anemia in chronic kidney disease: Secondary | ICD-10-CM | POA: Diagnosis not present

## 2018-06-19 DIAGNOSIS — N2581 Secondary hyperparathyroidism of renal origin: Secondary | ICD-10-CM | POA: Diagnosis not present

## 2018-06-19 DIAGNOSIS — Z992 Dependence on renal dialysis: Secondary | ICD-10-CM | POA: Diagnosis not present

## 2018-06-19 DIAGNOSIS — N186 End stage renal disease: Secondary | ICD-10-CM | POA: Diagnosis not present

## 2018-06-20 DIAGNOSIS — D509 Iron deficiency anemia, unspecified: Secondary | ICD-10-CM | POA: Diagnosis not present

## 2018-06-20 DIAGNOSIS — N2581 Secondary hyperparathyroidism of renal origin: Secondary | ICD-10-CM | POA: Diagnosis not present

## 2018-06-20 DIAGNOSIS — D631 Anemia in chronic kidney disease: Secondary | ICD-10-CM | POA: Diagnosis not present

## 2018-06-20 DIAGNOSIS — N186 End stage renal disease: Secondary | ICD-10-CM | POA: Diagnosis not present

## 2018-06-20 DIAGNOSIS — Z992 Dependence on renal dialysis: Secondary | ICD-10-CM | POA: Diagnosis not present

## 2018-06-21 ENCOUNTER — Telehealth: Payer: Self-pay | Admitting: *Deleted

## 2018-06-21 DIAGNOSIS — Z992 Dependence on renal dialysis: Secondary | ICD-10-CM | POA: Diagnosis not present

## 2018-06-21 DIAGNOSIS — D509 Iron deficiency anemia, unspecified: Secondary | ICD-10-CM | POA: Diagnosis not present

## 2018-06-21 DIAGNOSIS — D631 Anemia in chronic kidney disease: Secondary | ICD-10-CM | POA: Diagnosis not present

## 2018-06-21 DIAGNOSIS — N186 End stage renal disease: Secondary | ICD-10-CM | POA: Diagnosis not present

## 2018-06-21 DIAGNOSIS — N2581 Secondary hyperparathyroidism of renal origin: Secondary | ICD-10-CM | POA: Diagnosis not present

## 2018-06-21 NOTE — Telephone Encounter (Signed)
Spoke with patient - he does want to come back for follow up.     Primary Cardiologist:  Kate Sable, MD   Patient contacted.  History reviewed.  No symptoms to suggest any unstable cardiac conditions.  Based on discussion, with current pandemic situation, we will be postponing this appointment for Jesus [name] with a plan for f/u 09/13/2018 with Dr. Bronson Ing.  If symptoms change, he has been instructed to contact our office.    Marland Kitchen

## 2018-06-21 NOTE — Telephone Encounter (Signed)
Attempted to reach patient to reschedule OV for 06/25/2018 with Dr. Bronson Ing.    Comment in patient summary section states - 04/27/2018 per patient telephone call - Requested to have all Cardiology appointments cancelled at this time.   Left message to return call.

## 2018-06-22 DIAGNOSIS — N2581 Secondary hyperparathyroidism of renal origin: Secondary | ICD-10-CM | POA: Diagnosis not present

## 2018-06-22 DIAGNOSIS — Z992 Dependence on renal dialysis: Secondary | ICD-10-CM | POA: Diagnosis not present

## 2018-06-22 DIAGNOSIS — D631 Anemia in chronic kidney disease: Secondary | ICD-10-CM | POA: Diagnosis not present

## 2018-06-22 DIAGNOSIS — D509 Iron deficiency anemia, unspecified: Secondary | ICD-10-CM | POA: Diagnosis not present

## 2018-06-22 DIAGNOSIS — N186 End stage renal disease: Secondary | ICD-10-CM | POA: Diagnosis not present

## 2018-06-23 DIAGNOSIS — N186 End stage renal disease: Secondary | ICD-10-CM | POA: Diagnosis not present

## 2018-06-23 DIAGNOSIS — D509 Iron deficiency anemia, unspecified: Secondary | ICD-10-CM | POA: Diagnosis not present

## 2018-06-23 DIAGNOSIS — D631 Anemia in chronic kidney disease: Secondary | ICD-10-CM | POA: Diagnosis not present

## 2018-06-23 DIAGNOSIS — Z992 Dependence on renal dialysis: Secondary | ICD-10-CM | POA: Diagnosis not present

## 2018-06-23 DIAGNOSIS — N2581 Secondary hyperparathyroidism of renal origin: Secondary | ICD-10-CM | POA: Diagnosis not present

## 2018-06-24 DIAGNOSIS — N2581 Secondary hyperparathyroidism of renal origin: Secondary | ICD-10-CM | POA: Diagnosis not present

## 2018-06-24 DIAGNOSIS — N186 End stage renal disease: Secondary | ICD-10-CM | POA: Diagnosis not present

## 2018-06-24 DIAGNOSIS — D631 Anemia in chronic kidney disease: Secondary | ICD-10-CM | POA: Diagnosis not present

## 2018-06-24 DIAGNOSIS — D509 Iron deficiency anemia, unspecified: Secondary | ICD-10-CM | POA: Diagnosis not present

## 2018-06-24 DIAGNOSIS — Z992 Dependence on renal dialysis: Secondary | ICD-10-CM | POA: Diagnosis not present

## 2018-06-25 ENCOUNTER — Ambulatory Visit: Payer: Self-pay | Admitting: Cardiovascular Disease

## 2018-06-25 DIAGNOSIS — Z992 Dependence on renal dialysis: Secondary | ICD-10-CM | POA: Diagnosis not present

## 2018-06-25 DIAGNOSIS — N2581 Secondary hyperparathyroidism of renal origin: Secondary | ICD-10-CM | POA: Diagnosis not present

## 2018-06-25 DIAGNOSIS — N186 End stage renal disease: Secondary | ICD-10-CM | POA: Diagnosis not present

## 2018-06-25 DIAGNOSIS — D631 Anemia in chronic kidney disease: Secondary | ICD-10-CM | POA: Diagnosis not present

## 2018-06-25 DIAGNOSIS — D509 Iron deficiency anemia, unspecified: Secondary | ICD-10-CM | POA: Diagnosis not present

## 2018-06-26 DIAGNOSIS — Z992 Dependence on renal dialysis: Secondary | ICD-10-CM | POA: Diagnosis not present

## 2018-06-26 DIAGNOSIS — N2581 Secondary hyperparathyroidism of renal origin: Secondary | ICD-10-CM | POA: Diagnosis not present

## 2018-06-26 DIAGNOSIS — D631 Anemia in chronic kidney disease: Secondary | ICD-10-CM | POA: Diagnosis not present

## 2018-06-26 DIAGNOSIS — D509 Iron deficiency anemia, unspecified: Secondary | ICD-10-CM | POA: Diagnosis not present

## 2018-06-26 DIAGNOSIS — N186 End stage renal disease: Secondary | ICD-10-CM | POA: Diagnosis not present

## 2018-06-27 DIAGNOSIS — D631 Anemia in chronic kidney disease: Secondary | ICD-10-CM | POA: Diagnosis not present

## 2018-06-27 DIAGNOSIS — N2581 Secondary hyperparathyroidism of renal origin: Secondary | ICD-10-CM | POA: Diagnosis not present

## 2018-06-27 DIAGNOSIS — N186 End stage renal disease: Secondary | ICD-10-CM | POA: Diagnosis not present

## 2018-06-27 DIAGNOSIS — D509 Iron deficiency anemia, unspecified: Secondary | ICD-10-CM | POA: Diagnosis not present

## 2018-06-27 DIAGNOSIS — Z992 Dependence on renal dialysis: Secondary | ICD-10-CM | POA: Diagnosis not present

## 2018-06-28 DIAGNOSIS — D509 Iron deficiency anemia, unspecified: Secondary | ICD-10-CM | POA: Diagnosis not present

## 2018-06-28 DIAGNOSIS — N186 End stage renal disease: Secondary | ICD-10-CM | POA: Diagnosis not present

## 2018-06-28 DIAGNOSIS — N2581 Secondary hyperparathyroidism of renal origin: Secondary | ICD-10-CM | POA: Diagnosis not present

## 2018-06-28 DIAGNOSIS — D631 Anemia in chronic kidney disease: Secondary | ICD-10-CM | POA: Diagnosis not present

## 2018-06-28 DIAGNOSIS — Z992 Dependence on renal dialysis: Secondary | ICD-10-CM | POA: Diagnosis not present

## 2018-06-29 DIAGNOSIS — N2581 Secondary hyperparathyroidism of renal origin: Secondary | ICD-10-CM | POA: Diagnosis not present

## 2018-06-29 DIAGNOSIS — Z992 Dependence on renal dialysis: Secondary | ICD-10-CM | POA: Diagnosis not present

## 2018-06-29 DIAGNOSIS — N186 End stage renal disease: Secondary | ICD-10-CM | POA: Diagnosis not present

## 2018-06-29 DIAGNOSIS — D509 Iron deficiency anemia, unspecified: Secondary | ICD-10-CM | POA: Diagnosis not present

## 2018-06-29 DIAGNOSIS — D631 Anemia in chronic kidney disease: Secondary | ICD-10-CM | POA: Diagnosis not present

## 2018-06-30 DIAGNOSIS — D509 Iron deficiency anemia, unspecified: Secondary | ICD-10-CM | POA: Diagnosis not present

## 2018-06-30 DIAGNOSIS — D631 Anemia in chronic kidney disease: Secondary | ICD-10-CM | POA: Diagnosis not present

## 2018-06-30 DIAGNOSIS — Z992 Dependence on renal dialysis: Secondary | ICD-10-CM | POA: Diagnosis not present

## 2018-06-30 DIAGNOSIS — N2581 Secondary hyperparathyroidism of renal origin: Secondary | ICD-10-CM | POA: Diagnosis not present

## 2018-06-30 DIAGNOSIS — N186 End stage renal disease: Secondary | ICD-10-CM | POA: Diagnosis not present

## 2018-07-01 DIAGNOSIS — N186 End stage renal disease: Secondary | ICD-10-CM | POA: Diagnosis not present

## 2018-07-01 DIAGNOSIS — D631 Anemia in chronic kidney disease: Secondary | ICD-10-CM | POA: Diagnosis not present

## 2018-07-01 DIAGNOSIS — Z992 Dependence on renal dialysis: Secondary | ICD-10-CM | POA: Diagnosis not present

## 2018-07-01 DIAGNOSIS — N2581 Secondary hyperparathyroidism of renal origin: Secondary | ICD-10-CM | POA: Diagnosis not present

## 2018-07-01 DIAGNOSIS — D509 Iron deficiency anemia, unspecified: Secondary | ICD-10-CM | POA: Diagnosis not present

## 2018-07-02 DIAGNOSIS — N2581 Secondary hyperparathyroidism of renal origin: Secondary | ICD-10-CM | POA: Diagnosis not present

## 2018-07-02 DIAGNOSIS — Z992 Dependence on renal dialysis: Secondary | ICD-10-CM | POA: Diagnosis not present

## 2018-07-02 DIAGNOSIS — N186 End stage renal disease: Secondary | ICD-10-CM | POA: Diagnosis not present

## 2018-07-02 DIAGNOSIS — D631 Anemia in chronic kidney disease: Secondary | ICD-10-CM | POA: Diagnosis not present

## 2018-07-02 DIAGNOSIS — D509 Iron deficiency anemia, unspecified: Secondary | ICD-10-CM | POA: Diagnosis not present

## 2018-07-03 DIAGNOSIS — N186 End stage renal disease: Secondary | ICD-10-CM | POA: Diagnosis not present

## 2018-07-03 DIAGNOSIS — Z992 Dependence on renal dialysis: Secondary | ICD-10-CM | POA: Diagnosis not present

## 2018-07-03 DIAGNOSIS — D631 Anemia in chronic kidney disease: Secondary | ICD-10-CM | POA: Diagnosis not present

## 2018-07-03 DIAGNOSIS — N2581 Secondary hyperparathyroidism of renal origin: Secondary | ICD-10-CM | POA: Diagnosis not present

## 2018-07-03 DIAGNOSIS — D509 Iron deficiency anemia, unspecified: Secondary | ICD-10-CM | POA: Diagnosis not present

## 2018-07-04 DIAGNOSIS — D631 Anemia in chronic kidney disease: Secondary | ICD-10-CM | POA: Diagnosis not present

## 2018-07-04 DIAGNOSIS — Z992 Dependence on renal dialysis: Secondary | ICD-10-CM | POA: Diagnosis not present

## 2018-07-04 DIAGNOSIS — D509 Iron deficiency anemia, unspecified: Secondary | ICD-10-CM | POA: Diagnosis not present

## 2018-07-04 DIAGNOSIS — N2581 Secondary hyperparathyroidism of renal origin: Secondary | ICD-10-CM | POA: Diagnosis not present

## 2018-07-04 DIAGNOSIS — N186 End stage renal disease: Secondary | ICD-10-CM | POA: Diagnosis not present

## 2018-07-05 DIAGNOSIS — Z992 Dependence on renal dialysis: Secondary | ICD-10-CM | POA: Diagnosis not present

## 2018-07-05 DIAGNOSIS — D631 Anemia in chronic kidney disease: Secondary | ICD-10-CM | POA: Diagnosis not present

## 2018-07-05 DIAGNOSIS — N2581 Secondary hyperparathyroidism of renal origin: Secondary | ICD-10-CM | POA: Diagnosis not present

## 2018-07-05 DIAGNOSIS — D509 Iron deficiency anemia, unspecified: Secondary | ICD-10-CM | POA: Diagnosis not present

## 2018-07-05 DIAGNOSIS — N186 End stage renal disease: Secondary | ICD-10-CM | POA: Diagnosis not present

## 2018-07-06 DIAGNOSIS — N186 End stage renal disease: Secondary | ICD-10-CM | POA: Diagnosis not present

## 2018-07-06 DIAGNOSIS — D509 Iron deficiency anemia, unspecified: Secondary | ICD-10-CM | POA: Diagnosis not present

## 2018-07-06 DIAGNOSIS — Z992 Dependence on renal dialysis: Secondary | ICD-10-CM | POA: Diagnosis not present

## 2018-07-06 DIAGNOSIS — N2581 Secondary hyperparathyroidism of renal origin: Secondary | ICD-10-CM | POA: Diagnosis not present

## 2018-07-06 DIAGNOSIS — D631 Anemia in chronic kidney disease: Secondary | ICD-10-CM | POA: Diagnosis not present

## 2018-07-07 DIAGNOSIS — D509 Iron deficiency anemia, unspecified: Secondary | ICD-10-CM | POA: Diagnosis not present

## 2018-07-07 DIAGNOSIS — D631 Anemia in chronic kidney disease: Secondary | ICD-10-CM | POA: Diagnosis not present

## 2018-07-07 DIAGNOSIS — N2581 Secondary hyperparathyroidism of renal origin: Secondary | ICD-10-CM | POA: Diagnosis not present

## 2018-07-07 DIAGNOSIS — N186 End stage renal disease: Secondary | ICD-10-CM | POA: Diagnosis not present

## 2018-07-07 DIAGNOSIS — Z992 Dependence on renal dialysis: Secondary | ICD-10-CM | POA: Diagnosis not present

## 2018-07-08 DIAGNOSIS — N186 End stage renal disease: Secondary | ICD-10-CM | POA: Diagnosis not present

## 2018-07-08 DIAGNOSIS — D631 Anemia in chronic kidney disease: Secondary | ICD-10-CM | POA: Diagnosis not present

## 2018-07-08 DIAGNOSIS — Z992 Dependence on renal dialysis: Secondary | ICD-10-CM | POA: Diagnosis not present

## 2018-07-08 DIAGNOSIS — N2581 Secondary hyperparathyroidism of renal origin: Secondary | ICD-10-CM | POA: Diagnosis not present

## 2018-07-08 DIAGNOSIS — D509 Iron deficiency anemia, unspecified: Secondary | ICD-10-CM | POA: Diagnosis not present

## 2018-07-09 DIAGNOSIS — D631 Anemia in chronic kidney disease: Secondary | ICD-10-CM | POA: Diagnosis not present

## 2018-07-09 DIAGNOSIS — N186 End stage renal disease: Secondary | ICD-10-CM | POA: Diagnosis not present

## 2018-07-09 DIAGNOSIS — N2581 Secondary hyperparathyroidism of renal origin: Secondary | ICD-10-CM | POA: Diagnosis not present

## 2018-07-09 DIAGNOSIS — Z992 Dependence on renal dialysis: Secondary | ICD-10-CM | POA: Diagnosis not present

## 2018-07-09 DIAGNOSIS — D509 Iron deficiency anemia, unspecified: Secondary | ICD-10-CM | POA: Diagnosis not present

## 2018-07-10 DIAGNOSIS — Z992 Dependence on renal dialysis: Secondary | ICD-10-CM | POA: Diagnosis not present

## 2018-07-10 DIAGNOSIS — N2581 Secondary hyperparathyroidism of renal origin: Secondary | ICD-10-CM | POA: Diagnosis not present

## 2018-07-10 DIAGNOSIS — D631 Anemia in chronic kidney disease: Secondary | ICD-10-CM | POA: Diagnosis not present

## 2018-07-10 DIAGNOSIS — D509 Iron deficiency anemia, unspecified: Secondary | ICD-10-CM | POA: Diagnosis not present

## 2018-07-10 DIAGNOSIS — N186 End stage renal disease: Secondary | ICD-10-CM | POA: Diagnosis not present

## 2018-07-11 DIAGNOSIS — D509 Iron deficiency anemia, unspecified: Secondary | ICD-10-CM | POA: Diagnosis not present

## 2018-07-11 DIAGNOSIS — D631 Anemia in chronic kidney disease: Secondary | ICD-10-CM | POA: Diagnosis not present

## 2018-07-11 DIAGNOSIS — N186 End stage renal disease: Secondary | ICD-10-CM | POA: Diagnosis not present

## 2018-07-11 DIAGNOSIS — Z992 Dependence on renal dialysis: Secondary | ICD-10-CM | POA: Diagnosis not present

## 2018-07-11 DIAGNOSIS — N2581 Secondary hyperparathyroidism of renal origin: Secondary | ICD-10-CM | POA: Diagnosis not present

## 2018-07-12 DIAGNOSIS — Z992 Dependence on renal dialysis: Secondary | ICD-10-CM | POA: Diagnosis not present

## 2018-07-12 DIAGNOSIS — D509 Iron deficiency anemia, unspecified: Secondary | ICD-10-CM | POA: Diagnosis not present

## 2018-07-12 DIAGNOSIS — N2581 Secondary hyperparathyroidism of renal origin: Secondary | ICD-10-CM | POA: Diagnosis not present

## 2018-07-12 DIAGNOSIS — D631 Anemia in chronic kidney disease: Secondary | ICD-10-CM | POA: Diagnosis not present

## 2018-07-12 DIAGNOSIS — N186 End stage renal disease: Secondary | ICD-10-CM | POA: Diagnosis not present

## 2018-07-13 DIAGNOSIS — D509 Iron deficiency anemia, unspecified: Secondary | ICD-10-CM | POA: Diagnosis not present

## 2018-07-13 DIAGNOSIS — N186 End stage renal disease: Secondary | ICD-10-CM | POA: Diagnosis not present

## 2018-07-13 DIAGNOSIS — N2581 Secondary hyperparathyroidism of renal origin: Secondary | ICD-10-CM | POA: Diagnosis not present

## 2018-07-13 DIAGNOSIS — Z992 Dependence on renal dialysis: Secondary | ICD-10-CM | POA: Diagnosis not present

## 2018-07-13 DIAGNOSIS — D631 Anemia in chronic kidney disease: Secondary | ICD-10-CM | POA: Diagnosis not present

## 2018-07-14 DIAGNOSIS — Z992 Dependence on renal dialysis: Secondary | ICD-10-CM | POA: Diagnosis not present

## 2018-07-14 DIAGNOSIS — D509 Iron deficiency anemia, unspecified: Secondary | ICD-10-CM | POA: Diagnosis not present

## 2018-07-14 DIAGNOSIS — N2581 Secondary hyperparathyroidism of renal origin: Secondary | ICD-10-CM | POA: Diagnosis not present

## 2018-07-14 DIAGNOSIS — N186 End stage renal disease: Secondary | ICD-10-CM | POA: Diagnosis not present

## 2018-07-14 DIAGNOSIS — D631 Anemia in chronic kidney disease: Secondary | ICD-10-CM | POA: Diagnosis not present

## 2018-07-15 DIAGNOSIS — Z992 Dependence on renal dialysis: Secondary | ICD-10-CM | POA: Diagnosis not present

## 2018-07-15 DIAGNOSIS — N2581 Secondary hyperparathyroidism of renal origin: Secondary | ICD-10-CM | POA: Diagnosis not present

## 2018-07-15 DIAGNOSIS — D509 Iron deficiency anemia, unspecified: Secondary | ICD-10-CM | POA: Diagnosis not present

## 2018-07-15 DIAGNOSIS — D631 Anemia in chronic kidney disease: Secondary | ICD-10-CM | POA: Diagnosis not present

## 2018-07-15 DIAGNOSIS — N186 End stage renal disease: Secondary | ICD-10-CM | POA: Diagnosis not present

## 2018-07-16 DIAGNOSIS — Z992 Dependence on renal dialysis: Secondary | ICD-10-CM | POA: Diagnosis not present

## 2018-07-16 DIAGNOSIS — N2581 Secondary hyperparathyroidism of renal origin: Secondary | ICD-10-CM | POA: Diagnosis not present

## 2018-07-16 DIAGNOSIS — N186 End stage renal disease: Secondary | ICD-10-CM | POA: Diagnosis not present

## 2018-07-16 DIAGNOSIS — D509 Iron deficiency anemia, unspecified: Secondary | ICD-10-CM | POA: Diagnosis not present

## 2018-07-16 DIAGNOSIS — D631 Anemia in chronic kidney disease: Secondary | ICD-10-CM | POA: Diagnosis not present

## 2018-07-17 ENCOUNTER — Telehealth: Payer: Self-pay | Admitting: Cardiovascular Disease

## 2018-07-17 DIAGNOSIS — D631 Anemia in chronic kidney disease: Secondary | ICD-10-CM | POA: Diagnosis not present

## 2018-07-17 DIAGNOSIS — N2581 Secondary hyperparathyroidism of renal origin: Secondary | ICD-10-CM | POA: Diagnosis not present

## 2018-07-17 DIAGNOSIS — D509 Iron deficiency anemia, unspecified: Secondary | ICD-10-CM | POA: Diagnosis not present

## 2018-07-17 DIAGNOSIS — Z992 Dependence on renal dialysis: Secondary | ICD-10-CM | POA: Diagnosis not present

## 2018-07-17 DIAGNOSIS — N186 End stage renal disease: Secondary | ICD-10-CM | POA: Diagnosis not present

## 2018-07-17 NOTE — Telephone Encounter (Signed)
Patient called stating that he is trying to get on a Kidney Transplant list. He was told by Sharlene Motts  that he would need to have a heart  Catheterization. Patient was told that we would need additional information from the transplant clinic.  He will get information and call the office.

## 2018-07-17 NOTE — Telephone Encounter (Signed)
Patient has to be examined on 08/01/18 by [facility] to see if he is even appropriate for transplant. He was giving [place] a heads up. He has a an apt with Dr.Koneswaran in July already scheduled

## 2018-07-17 NOTE — Telephone Encounter (Signed)
Noted- will FYI Dr.Koneswaran

## 2018-07-17 NOTE — Telephone Encounter (Signed)
Need records and appt with Dr Raliegh Ip before arranging cath, hasnt seen him in over 2 years   Zandra Abts MD

## 2018-07-18 ENCOUNTER — Encounter (INDEPENDENT_AMBULATORY_CARE_PROVIDER_SITE_OTHER): Payer: Self-pay | Admitting: *Deleted

## 2018-07-18 DIAGNOSIS — Z992 Dependence on renal dialysis: Secondary | ICD-10-CM | POA: Diagnosis not present

## 2018-07-18 DIAGNOSIS — N2581 Secondary hyperparathyroidism of renal origin: Secondary | ICD-10-CM | POA: Diagnosis not present

## 2018-07-18 DIAGNOSIS — D509 Iron deficiency anemia, unspecified: Secondary | ICD-10-CM | POA: Diagnosis not present

## 2018-07-18 DIAGNOSIS — D631 Anemia in chronic kidney disease: Secondary | ICD-10-CM | POA: Diagnosis not present

## 2018-07-18 DIAGNOSIS — N186 End stage renal disease: Secondary | ICD-10-CM | POA: Diagnosis not present

## 2018-07-19 DIAGNOSIS — N2581 Secondary hyperparathyroidism of renal origin: Secondary | ICD-10-CM | POA: Diagnosis not present

## 2018-07-19 DIAGNOSIS — Z992 Dependence on renal dialysis: Secondary | ICD-10-CM | POA: Diagnosis not present

## 2018-07-19 DIAGNOSIS — D509 Iron deficiency anemia, unspecified: Secondary | ICD-10-CM | POA: Diagnosis not present

## 2018-07-19 DIAGNOSIS — N186 End stage renal disease: Secondary | ICD-10-CM | POA: Diagnosis not present

## 2018-07-19 DIAGNOSIS — D631 Anemia in chronic kidney disease: Secondary | ICD-10-CM | POA: Diagnosis not present

## 2018-07-20 DIAGNOSIS — N186 End stage renal disease: Secondary | ICD-10-CM | POA: Diagnosis not present

## 2018-07-20 DIAGNOSIS — Z992 Dependence on renal dialysis: Secondary | ICD-10-CM | POA: Diagnosis not present

## 2018-07-20 DIAGNOSIS — D631 Anemia in chronic kidney disease: Secondary | ICD-10-CM | POA: Diagnosis not present

## 2018-07-20 DIAGNOSIS — N2581 Secondary hyperparathyroidism of renal origin: Secondary | ICD-10-CM | POA: Diagnosis not present

## 2018-07-20 DIAGNOSIS — D509 Iron deficiency anemia, unspecified: Secondary | ICD-10-CM | POA: Diagnosis not present

## 2018-07-21 DIAGNOSIS — Z992 Dependence on renal dialysis: Secondary | ICD-10-CM | POA: Diagnosis not present

## 2018-07-21 DIAGNOSIS — D631 Anemia in chronic kidney disease: Secondary | ICD-10-CM | POA: Diagnosis not present

## 2018-07-21 DIAGNOSIS — N2581 Secondary hyperparathyroidism of renal origin: Secondary | ICD-10-CM | POA: Diagnosis not present

## 2018-07-21 DIAGNOSIS — N186 End stage renal disease: Secondary | ICD-10-CM | POA: Diagnosis not present

## 2018-07-21 DIAGNOSIS — D509 Iron deficiency anemia, unspecified: Secondary | ICD-10-CM | POA: Diagnosis not present

## 2018-07-22 DIAGNOSIS — D509 Iron deficiency anemia, unspecified: Secondary | ICD-10-CM | POA: Diagnosis not present

## 2018-07-22 DIAGNOSIS — N2581 Secondary hyperparathyroidism of renal origin: Secondary | ICD-10-CM | POA: Diagnosis not present

## 2018-07-22 DIAGNOSIS — N186 End stage renal disease: Secondary | ICD-10-CM | POA: Diagnosis not present

## 2018-07-22 DIAGNOSIS — D631 Anemia in chronic kidney disease: Secondary | ICD-10-CM | POA: Diagnosis not present

## 2018-07-22 DIAGNOSIS — Z992 Dependence on renal dialysis: Secondary | ICD-10-CM | POA: Diagnosis not present

## 2018-07-23 DIAGNOSIS — N2581 Secondary hyperparathyroidism of renal origin: Secondary | ICD-10-CM | POA: Diagnosis not present

## 2018-07-23 DIAGNOSIS — N186 End stage renal disease: Secondary | ICD-10-CM | POA: Diagnosis not present

## 2018-07-23 DIAGNOSIS — D509 Iron deficiency anemia, unspecified: Secondary | ICD-10-CM | POA: Diagnosis not present

## 2018-07-23 DIAGNOSIS — D631 Anemia in chronic kidney disease: Secondary | ICD-10-CM | POA: Diagnosis not present

## 2018-07-23 DIAGNOSIS — Z992 Dependence on renal dialysis: Secondary | ICD-10-CM | POA: Diagnosis not present

## 2018-07-24 DIAGNOSIS — Z992 Dependence on renal dialysis: Secondary | ICD-10-CM | POA: Diagnosis not present

## 2018-07-24 DIAGNOSIS — N2581 Secondary hyperparathyroidism of renal origin: Secondary | ICD-10-CM | POA: Diagnosis not present

## 2018-07-24 DIAGNOSIS — N186 End stage renal disease: Secondary | ICD-10-CM | POA: Diagnosis not present

## 2018-07-24 DIAGNOSIS — D631 Anemia in chronic kidney disease: Secondary | ICD-10-CM | POA: Diagnosis not present

## 2018-07-24 DIAGNOSIS — D509 Iron deficiency anemia, unspecified: Secondary | ICD-10-CM | POA: Diagnosis not present

## 2018-07-25 DIAGNOSIS — D509 Iron deficiency anemia, unspecified: Secondary | ICD-10-CM | POA: Diagnosis not present

## 2018-07-25 DIAGNOSIS — N186 End stage renal disease: Secondary | ICD-10-CM | POA: Diagnosis not present

## 2018-07-25 DIAGNOSIS — D631 Anemia in chronic kidney disease: Secondary | ICD-10-CM | POA: Diagnosis not present

## 2018-07-25 DIAGNOSIS — N2581 Secondary hyperparathyroidism of renal origin: Secondary | ICD-10-CM | POA: Diagnosis not present

## 2018-07-25 DIAGNOSIS — Z992 Dependence on renal dialysis: Secondary | ICD-10-CM | POA: Diagnosis not present

## 2018-07-26 DIAGNOSIS — Z992 Dependence on renal dialysis: Secondary | ICD-10-CM | POA: Diagnosis not present

## 2018-07-26 DIAGNOSIS — D631 Anemia in chronic kidney disease: Secondary | ICD-10-CM | POA: Diagnosis not present

## 2018-07-26 DIAGNOSIS — D509 Iron deficiency anemia, unspecified: Secondary | ICD-10-CM | POA: Diagnosis not present

## 2018-07-26 DIAGNOSIS — N2581 Secondary hyperparathyroidism of renal origin: Secondary | ICD-10-CM | POA: Diagnosis not present

## 2018-07-26 DIAGNOSIS — N186 End stage renal disease: Secondary | ICD-10-CM | POA: Diagnosis not present

## 2018-07-27 DIAGNOSIS — D509 Iron deficiency anemia, unspecified: Secondary | ICD-10-CM | POA: Diagnosis not present

## 2018-07-27 DIAGNOSIS — N2581 Secondary hyperparathyroidism of renal origin: Secondary | ICD-10-CM | POA: Diagnosis not present

## 2018-07-27 DIAGNOSIS — Z992 Dependence on renal dialysis: Secondary | ICD-10-CM | POA: Diagnosis not present

## 2018-07-27 DIAGNOSIS — N186 End stage renal disease: Secondary | ICD-10-CM | POA: Diagnosis not present

## 2018-07-27 DIAGNOSIS — D631 Anemia in chronic kidney disease: Secondary | ICD-10-CM | POA: Diagnosis not present

## 2018-07-28 DIAGNOSIS — N186 End stage renal disease: Secondary | ICD-10-CM | POA: Diagnosis not present

## 2018-07-28 DIAGNOSIS — D509 Iron deficiency anemia, unspecified: Secondary | ICD-10-CM | POA: Diagnosis not present

## 2018-07-28 DIAGNOSIS — Z992 Dependence on renal dialysis: Secondary | ICD-10-CM | POA: Diagnosis not present

## 2018-07-28 DIAGNOSIS — N2581 Secondary hyperparathyroidism of renal origin: Secondary | ICD-10-CM | POA: Diagnosis not present

## 2018-07-28 DIAGNOSIS — D631 Anemia in chronic kidney disease: Secondary | ICD-10-CM | POA: Diagnosis not present

## 2018-07-29 DIAGNOSIS — D631 Anemia in chronic kidney disease: Secondary | ICD-10-CM | POA: Diagnosis not present

## 2018-07-29 DIAGNOSIS — D509 Iron deficiency anemia, unspecified: Secondary | ICD-10-CM | POA: Diagnosis not present

## 2018-07-29 DIAGNOSIS — N186 End stage renal disease: Secondary | ICD-10-CM | POA: Diagnosis not present

## 2018-07-29 DIAGNOSIS — N2581 Secondary hyperparathyroidism of renal origin: Secondary | ICD-10-CM | POA: Diagnosis not present

## 2018-07-29 DIAGNOSIS — Z992 Dependence on renal dialysis: Secondary | ICD-10-CM | POA: Diagnosis not present

## 2018-07-30 DIAGNOSIS — N186 End stage renal disease: Secondary | ICD-10-CM | POA: Diagnosis not present

## 2018-07-30 DIAGNOSIS — N2581 Secondary hyperparathyroidism of renal origin: Secondary | ICD-10-CM | POA: Diagnosis not present

## 2018-07-30 DIAGNOSIS — D509 Iron deficiency anemia, unspecified: Secondary | ICD-10-CM | POA: Diagnosis not present

## 2018-07-30 DIAGNOSIS — D631 Anemia in chronic kidney disease: Secondary | ICD-10-CM | POA: Diagnosis not present

## 2018-07-30 DIAGNOSIS — Z992 Dependence on renal dialysis: Secondary | ICD-10-CM | POA: Diagnosis not present

## 2018-07-31 DIAGNOSIS — D631 Anemia in chronic kidney disease: Secondary | ICD-10-CM | POA: Diagnosis not present

## 2018-07-31 DIAGNOSIS — D509 Iron deficiency anemia, unspecified: Secondary | ICD-10-CM | POA: Diagnosis not present

## 2018-07-31 DIAGNOSIS — N186 End stage renal disease: Secondary | ICD-10-CM | POA: Diagnosis not present

## 2018-07-31 DIAGNOSIS — Z992 Dependence on renal dialysis: Secondary | ICD-10-CM | POA: Diagnosis not present

## 2018-07-31 DIAGNOSIS — N2581 Secondary hyperparathyroidism of renal origin: Secondary | ICD-10-CM | POA: Diagnosis not present

## 2018-08-01 DIAGNOSIS — Z992 Dependence on renal dialysis: Secondary | ICD-10-CM | POA: Diagnosis not present

## 2018-08-01 DIAGNOSIS — N186 End stage renal disease: Secondary | ICD-10-CM | POA: Diagnosis not present

## 2018-08-01 DIAGNOSIS — D631 Anemia in chronic kidney disease: Secondary | ICD-10-CM | POA: Diagnosis not present

## 2018-08-01 DIAGNOSIS — N2581 Secondary hyperparathyroidism of renal origin: Secondary | ICD-10-CM | POA: Diagnosis not present

## 2018-08-01 DIAGNOSIS — D509 Iron deficiency anemia, unspecified: Secondary | ICD-10-CM | POA: Diagnosis not present

## 2018-08-02 DIAGNOSIS — D631 Anemia in chronic kidney disease: Secondary | ICD-10-CM | POA: Diagnosis not present

## 2018-08-02 DIAGNOSIS — Z992 Dependence on renal dialysis: Secondary | ICD-10-CM | POA: Diagnosis not present

## 2018-08-02 DIAGNOSIS — N186 End stage renal disease: Secondary | ICD-10-CM | POA: Diagnosis not present

## 2018-08-02 DIAGNOSIS — N2581 Secondary hyperparathyroidism of renal origin: Secondary | ICD-10-CM | POA: Diagnosis not present

## 2018-08-02 DIAGNOSIS — D509 Iron deficiency anemia, unspecified: Secondary | ICD-10-CM | POA: Diagnosis not present

## 2018-08-03 ENCOUNTER — Ambulatory Visit (HOSPITAL_COMMUNITY): Payer: Medicare Other

## 2018-08-03 ENCOUNTER — Other Ambulatory Visit (HOSPITAL_COMMUNITY): Payer: Self-pay

## 2018-08-03 DIAGNOSIS — D631 Anemia in chronic kidney disease: Secondary | ICD-10-CM | POA: Diagnosis not present

## 2018-08-03 DIAGNOSIS — N186 End stage renal disease: Secondary | ICD-10-CM | POA: Diagnosis not present

## 2018-08-03 DIAGNOSIS — N2581 Secondary hyperparathyroidism of renal origin: Secondary | ICD-10-CM | POA: Diagnosis not present

## 2018-08-03 DIAGNOSIS — D509 Iron deficiency anemia, unspecified: Secondary | ICD-10-CM | POA: Diagnosis not present

## 2018-08-03 DIAGNOSIS — Z992 Dependence on renal dialysis: Secondary | ICD-10-CM | POA: Diagnosis not present

## 2018-08-04 DIAGNOSIS — N2581 Secondary hyperparathyroidism of renal origin: Secondary | ICD-10-CM | POA: Diagnosis not present

## 2018-08-04 DIAGNOSIS — D631 Anemia in chronic kidney disease: Secondary | ICD-10-CM | POA: Diagnosis not present

## 2018-08-04 DIAGNOSIS — D509 Iron deficiency anemia, unspecified: Secondary | ICD-10-CM | POA: Diagnosis not present

## 2018-08-04 DIAGNOSIS — Z992 Dependence on renal dialysis: Secondary | ICD-10-CM | POA: Diagnosis not present

## 2018-08-04 DIAGNOSIS — N186 End stage renal disease: Secondary | ICD-10-CM | POA: Diagnosis not present

## 2018-08-05 DIAGNOSIS — D509 Iron deficiency anemia, unspecified: Secondary | ICD-10-CM | POA: Diagnosis not present

## 2018-08-05 DIAGNOSIS — Z992 Dependence on renal dialysis: Secondary | ICD-10-CM | POA: Diagnosis not present

## 2018-08-05 DIAGNOSIS — N186 End stage renal disease: Secondary | ICD-10-CM | POA: Diagnosis not present

## 2018-08-05 DIAGNOSIS — D631 Anemia in chronic kidney disease: Secondary | ICD-10-CM | POA: Diagnosis not present

## 2018-08-05 DIAGNOSIS — N2581 Secondary hyperparathyroidism of renal origin: Secondary | ICD-10-CM | POA: Diagnosis not present

## 2018-08-06 DIAGNOSIS — D509 Iron deficiency anemia, unspecified: Secondary | ICD-10-CM | POA: Diagnosis not present

## 2018-08-06 DIAGNOSIS — Z992 Dependence on renal dialysis: Secondary | ICD-10-CM | POA: Diagnosis not present

## 2018-08-06 DIAGNOSIS — D631 Anemia in chronic kidney disease: Secondary | ICD-10-CM | POA: Diagnosis not present

## 2018-08-06 DIAGNOSIS — N186 End stage renal disease: Secondary | ICD-10-CM | POA: Diagnosis not present

## 2018-08-06 DIAGNOSIS — N2581 Secondary hyperparathyroidism of renal origin: Secondary | ICD-10-CM | POA: Diagnosis not present

## 2018-08-07 ENCOUNTER — Ambulatory Visit (INDEPENDENT_AMBULATORY_CARE_PROVIDER_SITE_OTHER): Payer: Medicare Other | Admitting: Internal Medicine

## 2018-08-07 DIAGNOSIS — Z992 Dependence on renal dialysis: Secondary | ICD-10-CM | POA: Diagnosis not present

## 2018-08-07 DIAGNOSIS — N2581 Secondary hyperparathyroidism of renal origin: Secondary | ICD-10-CM | POA: Diagnosis not present

## 2018-08-07 DIAGNOSIS — D509 Iron deficiency anemia, unspecified: Secondary | ICD-10-CM | POA: Diagnosis not present

## 2018-08-07 DIAGNOSIS — N186 End stage renal disease: Secondary | ICD-10-CM | POA: Diagnosis not present

## 2018-08-07 DIAGNOSIS — D631 Anemia in chronic kidney disease: Secondary | ICD-10-CM | POA: Diagnosis not present

## 2018-08-08 DIAGNOSIS — D509 Iron deficiency anemia, unspecified: Secondary | ICD-10-CM | POA: Diagnosis not present

## 2018-08-08 DIAGNOSIS — D631 Anemia in chronic kidney disease: Secondary | ICD-10-CM | POA: Diagnosis not present

## 2018-08-08 DIAGNOSIS — Z992 Dependence on renal dialysis: Secondary | ICD-10-CM | POA: Diagnosis not present

## 2018-08-08 DIAGNOSIS — N186 End stage renal disease: Secondary | ICD-10-CM | POA: Diagnosis not present

## 2018-08-08 DIAGNOSIS — N2581 Secondary hyperparathyroidism of renal origin: Secondary | ICD-10-CM | POA: Diagnosis not present

## 2018-08-09 DIAGNOSIS — D631 Anemia in chronic kidney disease: Secondary | ICD-10-CM | POA: Diagnosis not present

## 2018-08-09 DIAGNOSIS — Z992 Dependence on renal dialysis: Secondary | ICD-10-CM | POA: Diagnosis not present

## 2018-08-09 DIAGNOSIS — D509 Iron deficiency anemia, unspecified: Secondary | ICD-10-CM | POA: Diagnosis not present

## 2018-08-09 DIAGNOSIS — N186 End stage renal disease: Secondary | ICD-10-CM | POA: Diagnosis not present

## 2018-08-09 DIAGNOSIS — N2581 Secondary hyperparathyroidism of renal origin: Secondary | ICD-10-CM | POA: Diagnosis not present

## 2018-08-10 ENCOUNTER — Ambulatory Visit (HOSPITAL_COMMUNITY): Payer: Self-pay | Admitting: Hematology

## 2018-08-10 DIAGNOSIS — N186 End stage renal disease: Secondary | ICD-10-CM | POA: Diagnosis not present

## 2018-08-10 DIAGNOSIS — D631 Anemia in chronic kidney disease: Secondary | ICD-10-CM | POA: Diagnosis not present

## 2018-08-10 DIAGNOSIS — Z992 Dependence on renal dialysis: Secondary | ICD-10-CM | POA: Diagnosis not present

## 2018-08-10 DIAGNOSIS — N2581 Secondary hyperparathyroidism of renal origin: Secondary | ICD-10-CM | POA: Diagnosis not present

## 2018-08-10 DIAGNOSIS — D509 Iron deficiency anemia, unspecified: Secondary | ICD-10-CM | POA: Diagnosis not present

## 2018-08-11 DIAGNOSIS — N186 End stage renal disease: Secondary | ICD-10-CM | POA: Diagnosis not present

## 2018-08-11 DIAGNOSIS — D631 Anemia in chronic kidney disease: Secondary | ICD-10-CM | POA: Diagnosis not present

## 2018-08-11 DIAGNOSIS — D509 Iron deficiency anemia, unspecified: Secondary | ICD-10-CM | POA: Diagnosis not present

## 2018-08-11 DIAGNOSIS — N2581 Secondary hyperparathyroidism of renal origin: Secondary | ICD-10-CM | POA: Diagnosis not present

## 2018-08-11 DIAGNOSIS — Z992 Dependence on renal dialysis: Secondary | ICD-10-CM | POA: Diagnosis not present

## 2018-08-12 DIAGNOSIS — D509 Iron deficiency anemia, unspecified: Secondary | ICD-10-CM | POA: Diagnosis not present

## 2018-08-12 DIAGNOSIS — N186 End stage renal disease: Secondary | ICD-10-CM | POA: Diagnosis not present

## 2018-08-12 DIAGNOSIS — Z992 Dependence on renal dialysis: Secondary | ICD-10-CM | POA: Diagnosis not present

## 2018-08-12 DIAGNOSIS — N2581 Secondary hyperparathyroidism of renal origin: Secondary | ICD-10-CM | POA: Diagnosis not present

## 2018-08-12 DIAGNOSIS — D631 Anemia in chronic kidney disease: Secondary | ICD-10-CM | POA: Diagnosis not present

## 2018-08-13 DIAGNOSIS — D631 Anemia in chronic kidney disease: Secondary | ICD-10-CM | POA: Diagnosis not present

## 2018-08-13 DIAGNOSIS — N2581 Secondary hyperparathyroidism of renal origin: Secondary | ICD-10-CM | POA: Diagnosis not present

## 2018-08-13 DIAGNOSIS — N186 End stage renal disease: Secondary | ICD-10-CM | POA: Diagnosis not present

## 2018-08-13 DIAGNOSIS — Z992 Dependence on renal dialysis: Secondary | ICD-10-CM | POA: Diagnosis not present

## 2018-08-13 DIAGNOSIS — D509 Iron deficiency anemia, unspecified: Secondary | ICD-10-CM | POA: Diagnosis not present

## 2018-08-14 DIAGNOSIS — R945 Abnormal results of liver function studies: Secondary | ICD-10-CM | POA: Diagnosis not present

## 2018-08-14 DIAGNOSIS — Z992 Dependence on renal dialysis: Secondary | ICD-10-CM | POA: Diagnosis not present

## 2018-08-14 DIAGNOSIS — N2581 Secondary hyperparathyroidism of renal origin: Secondary | ICD-10-CM | POA: Diagnosis not present

## 2018-08-14 DIAGNOSIS — D631 Anemia in chronic kidney disease: Secondary | ICD-10-CM | POA: Diagnosis not present

## 2018-08-14 DIAGNOSIS — G894 Chronic pain syndrome: Secondary | ICD-10-CM | POA: Diagnosis not present

## 2018-08-14 DIAGNOSIS — I959 Hypotension, unspecified: Secondary | ICD-10-CM | POA: Diagnosis not present

## 2018-08-14 DIAGNOSIS — Z0000 Encounter for general adult medical examination without abnormal findings: Secondary | ICD-10-CM | POA: Diagnosis not present

## 2018-08-14 DIAGNOSIS — N186 End stage renal disease: Secondary | ICD-10-CM | POA: Diagnosis not present

## 2018-08-14 DIAGNOSIS — D509 Iron deficiency anemia, unspecified: Secondary | ICD-10-CM | POA: Diagnosis not present

## 2018-08-14 DIAGNOSIS — Z85528 Personal history of other malignant neoplasm of kidney: Secondary | ICD-10-CM | POA: Diagnosis not present

## 2018-08-14 DIAGNOSIS — I10 Essential (primary) hypertension: Secondary | ICD-10-CM | POA: Diagnosis not present

## 2018-08-14 DIAGNOSIS — I739 Peripheral vascular disease, unspecified: Secondary | ICD-10-CM | POA: Diagnosis not present

## 2018-08-14 DIAGNOSIS — M79672 Pain in left foot: Secondary | ICD-10-CM | POA: Diagnosis not present

## 2018-08-14 DIAGNOSIS — F411 Generalized anxiety disorder: Secondary | ICD-10-CM | POA: Diagnosis not present

## 2018-08-14 DIAGNOSIS — M79671 Pain in right foot: Secondary | ICD-10-CM | POA: Diagnosis not present

## 2018-08-14 DIAGNOSIS — L110 Acquired keratosis follicularis: Secondary | ICD-10-CM | POA: Diagnosis not present

## 2018-08-14 DIAGNOSIS — R770 Abnormality of albumin: Secondary | ICD-10-CM | POA: Diagnosis not present

## 2018-08-14 DIAGNOSIS — R944 Abnormal results of kidney function studies: Secondary | ICD-10-CM | POA: Diagnosis not present

## 2018-08-15 DIAGNOSIS — D631 Anemia in chronic kidney disease: Secondary | ICD-10-CM | POA: Diagnosis not present

## 2018-08-15 DIAGNOSIS — N186 End stage renal disease: Secondary | ICD-10-CM | POA: Diagnosis not present

## 2018-08-15 DIAGNOSIS — D509 Iron deficiency anemia, unspecified: Secondary | ICD-10-CM | POA: Diagnosis not present

## 2018-08-15 DIAGNOSIS — Z992 Dependence on renal dialysis: Secondary | ICD-10-CM | POA: Diagnosis not present

## 2018-08-15 DIAGNOSIS — N2581 Secondary hyperparathyroidism of renal origin: Secondary | ICD-10-CM | POA: Diagnosis not present

## 2018-08-16 DIAGNOSIS — N2581 Secondary hyperparathyroidism of renal origin: Secondary | ICD-10-CM | POA: Diagnosis not present

## 2018-08-16 DIAGNOSIS — Z992 Dependence on renal dialysis: Secondary | ICD-10-CM | POA: Diagnosis not present

## 2018-08-16 DIAGNOSIS — D509 Iron deficiency anemia, unspecified: Secondary | ICD-10-CM | POA: Diagnosis not present

## 2018-08-16 DIAGNOSIS — N186 End stage renal disease: Secondary | ICD-10-CM | POA: Diagnosis not present

## 2018-08-16 DIAGNOSIS — D631 Anemia in chronic kidney disease: Secondary | ICD-10-CM | POA: Diagnosis not present

## 2018-08-17 DIAGNOSIS — D631 Anemia in chronic kidney disease: Secondary | ICD-10-CM | POA: Diagnosis not present

## 2018-08-17 DIAGNOSIS — N2581 Secondary hyperparathyroidism of renal origin: Secondary | ICD-10-CM | POA: Diagnosis not present

## 2018-08-17 DIAGNOSIS — Z992 Dependence on renal dialysis: Secondary | ICD-10-CM | POA: Diagnosis not present

## 2018-08-17 DIAGNOSIS — D509 Iron deficiency anemia, unspecified: Secondary | ICD-10-CM | POA: Diagnosis not present

## 2018-08-17 DIAGNOSIS — N186 End stage renal disease: Secondary | ICD-10-CM | POA: Diagnosis not present

## 2018-08-18 DIAGNOSIS — Z992 Dependence on renal dialysis: Secondary | ICD-10-CM | POA: Diagnosis not present

## 2018-08-18 DIAGNOSIS — N186 End stage renal disease: Secondary | ICD-10-CM | POA: Diagnosis not present

## 2018-08-18 DIAGNOSIS — D631 Anemia in chronic kidney disease: Secondary | ICD-10-CM | POA: Diagnosis not present

## 2018-08-18 DIAGNOSIS — D509 Iron deficiency anemia, unspecified: Secondary | ICD-10-CM | POA: Diagnosis not present

## 2018-08-18 DIAGNOSIS — N2581 Secondary hyperparathyroidism of renal origin: Secondary | ICD-10-CM | POA: Diagnosis not present

## 2018-08-19 DIAGNOSIS — D631 Anemia in chronic kidney disease: Secondary | ICD-10-CM | POA: Diagnosis not present

## 2018-08-19 DIAGNOSIS — Z992 Dependence on renal dialysis: Secondary | ICD-10-CM | POA: Diagnosis not present

## 2018-08-19 DIAGNOSIS — N2581 Secondary hyperparathyroidism of renal origin: Secondary | ICD-10-CM | POA: Diagnosis not present

## 2018-08-19 DIAGNOSIS — N186 End stage renal disease: Secondary | ICD-10-CM | POA: Diagnosis not present

## 2018-08-19 DIAGNOSIS — D509 Iron deficiency anemia, unspecified: Secondary | ICD-10-CM | POA: Diagnosis not present

## 2018-08-20 DIAGNOSIS — D631 Anemia in chronic kidney disease: Secondary | ICD-10-CM | POA: Diagnosis not present

## 2018-08-20 DIAGNOSIS — N186 End stage renal disease: Secondary | ICD-10-CM | POA: Diagnosis not present

## 2018-08-20 DIAGNOSIS — N2581 Secondary hyperparathyroidism of renal origin: Secondary | ICD-10-CM | POA: Diagnosis not present

## 2018-08-20 DIAGNOSIS — D509 Iron deficiency anemia, unspecified: Secondary | ICD-10-CM | POA: Diagnosis not present

## 2018-08-20 DIAGNOSIS — Z992 Dependence on renal dialysis: Secondary | ICD-10-CM | POA: Diagnosis not present

## 2018-08-21 DIAGNOSIS — N2581 Secondary hyperparathyroidism of renal origin: Secondary | ICD-10-CM | POA: Diagnosis not present

## 2018-08-21 DIAGNOSIS — D509 Iron deficiency anemia, unspecified: Secondary | ICD-10-CM | POA: Diagnosis not present

## 2018-08-21 DIAGNOSIS — D631 Anemia in chronic kidney disease: Secondary | ICD-10-CM | POA: Diagnosis not present

## 2018-08-21 DIAGNOSIS — Z992 Dependence on renal dialysis: Secondary | ICD-10-CM | POA: Diagnosis not present

## 2018-08-21 DIAGNOSIS — N186 End stage renal disease: Secondary | ICD-10-CM | POA: Diagnosis not present

## 2018-08-22 DIAGNOSIS — D509 Iron deficiency anemia, unspecified: Secondary | ICD-10-CM | POA: Diagnosis not present

## 2018-08-22 DIAGNOSIS — D631 Anemia in chronic kidney disease: Secondary | ICD-10-CM | POA: Diagnosis not present

## 2018-08-22 DIAGNOSIS — N186 End stage renal disease: Secondary | ICD-10-CM | POA: Diagnosis not present

## 2018-08-22 DIAGNOSIS — N2581 Secondary hyperparathyroidism of renal origin: Secondary | ICD-10-CM | POA: Diagnosis not present

## 2018-08-22 DIAGNOSIS — Z992 Dependence on renal dialysis: Secondary | ICD-10-CM | POA: Diagnosis not present

## 2018-08-23 DIAGNOSIS — D509 Iron deficiency anemia, unspecified: Secondary | ICD-10-CM | POA: Diagnosis not present

## 2018-08-23 DIAGNOSIS — Z992 Dependence on renal dialysis: Secondary | ICD-10-CM | POA: Diagnosis not present

## 2018-08-23 DIAGNOSIS — D631 Anemia in chronic kidney disease: Secondary | ICD-10-CM | POA: Diagnosis not present

## 2018-08-23 DIAGNOSIS — N186 End stage renal disease: Secondary | ICD-10-CM | POA: Diagnosis not present

## 2018-08-23 DIAGNOSIS — N2581 Secondary hyperparathyroidism of renal origin: Secondary | ICD-10-CM | POA: Diagnosis not present

## 2018-08-24 DIAGNOSIS — N186 End stage renal disease: Secondary | ICD-10-CM | POA: Diagnosis not present

## 2018-08-24 DIAGNOSIS — D631 Anemia in chronic kidney disease: Secondary | ICD-10-CM | POA: Diagnosis not present

## 2018-08-24 DIAGNOSIS — Z992 Dependence on renal dialysis: Secondary | ICD-10-CM | POA: Diagnosis not present

## 2018-08-24 DIAGNOSIS — D509 Iron deficiency anemia, unspecified: Secondary | ICD-10-CM | POA: Diagnosis not present

## 2018-08-24 DIAGNOSIS — N2581 Secondary hyperparathyroidism of renal origin: Secondary | ICD-10-CM | POA: Diagnosis not present

## 2018-08-25 DIAGNOSIS — D631 Anemia in chronic kidney disease: Secondary | ICD-10-CM | POA: Diagnosis not present

## 2018-08-25 DIAGNOSIS — N186 End stage renal disease: Secondary | ICD-10-CM | POA: Diagnosis not present

## 2018-08-25 DIAGNOSIS — N2581 Secondary hyperparathyroidism of renal origin: Secondary | ICD-10-CM | POA: Diagnosis not present

## 2018-08-25 DIAGNOSIS — D509 Iron deficiency anemia, unspecified: Secondary | ICD-10-CM | POA: Diagnosis not present

## 2018-08-25 DIAGNOSIS — Z992 Dependence on renal dialysis: Secondary | ICD-10-CM | POA: Diagnosis not present

## 2018-08-26 DIAGNOSIS — N2581 Secondary hyperparathyroidism of renal origin: Secondary | ICD-10-CM | POA: Diagnosis not present

## 2018-08-26 DIAGNOSIS — D509 Iron deficiency anemia, unspecified: Secondary | ICD-10-CM | POA: Diagnosis not present

## 2018-08-26 DIAGNOSIS — Z992 Dependence on renal dialysis: Secondary | ICD-10-CM | POA: Diagnosis not present

## 2018-08-26 DIAGNOSIS — N186 End stage renal disease: Secondary | ICD-10-CM | POA: Diagnosis not present

## 2018-08-26 DIAGNOSIS — D631 Anemia in chronic kidney disease: Secondary | ICD-10-CM | POA: Diagnosis not present

## 2018-08-27 DIAGNOSIS — D631 Anemia in chronic kidney disease: Secondary | ICD-10-CM | POA: Diagnosis not present

## 2018-08-27 DIAGNOSIS — D509 Iron deficiency anemia, unspecified: Secondary | ICD-10-CM | POA: Diagnosis not present

## 2018-08-27 DIAGNOSIS — N186 End stage renal disease: Secondary | ICD-10-CM | POA: Diagnosis not present

## 2018-08-27 DIAGNOSIS — Z992 Dependence on renal dialysis: Secondary | ICD-10-CM | POA: Diagnosis not present

## 2018-08-27 DIAGNOSIS — N2581 Secondary hyperparathyroidism of renal origin: Secondary | ICD-10-CM | POA: Diagnosis not present

## 2018-08-28 DIAGNOSIS — N186 End stage renal disease: Secondary | ICD-10-CM | POA: Diagnosis not present

## 2018-08-28 DIAGNOSIS — D631 Anemia in chronic kidney disease: Secondary | ICD-10-CM | POA: Diagnosis not present

## 2018-08-28 DIAGNOSIS — Z992 Dependence on renal dialysis: Secondary | ICD-10-CM | POA: Diagnosis not present

## 2018-08-28 DIAGNOSIS — D509 Iron deficiency anemia, unspecified: Secondary | ICD-10-CM | POA: Diagnosis not present

## 2018-08-28 DIAGNOSIS — N2581 Secondary hyperparathyroidism of renal origin: Secondary | ICD-10-CM | POA: Diagnosis not present

## 2018-08-29 DIAGNOSIS — D509 Iron deficiency anemia, unspecified: Secondary | ICD-10-CM | POA: Diagnosis not present

## 2018-08-29 DIAGNOSIS — Z79899 Other long term (current) drug therapy: Secondary | ICD-10-CM | POA: Diagnosis not present

## 2018-08-29 DIAGNOSIS — N2581 Secondary hyperparathyroidism of renal origin: Secondary | ICD-10-CM | POA: Diagnosis not present

## 2018-08-29 DIAGNOSIS — N186 End stage renal disease: Secondary | ICD-10-CM | POA: Diagnosis not present

## 2018-08-29 DIAGNOSIS — Z992 Dependence on renal dialysis: Secondary | ICD-10-CM | POA: Diagnosis not present

## 2018-08-29 DIAGNOSIS — D631 Anemia in chronic kidney disease: Secondary | ICD-10-CM | POA: Diagnosis not present

## 2018-08-30 DIAGNOSIS — Z992 Dependence on renal dialysis: Secondary | ICD-10-CM | POA: Diagnosis not present

## 2018-08-30 DIAGNOSIS — N186 End stage renal disease: Secondary | ICD-10-CM | POA: Diagnosis not present

## 2018-08-30 DIAGNOSIS — D631 Anemia in chronic kidney disease: Secondary | ICD-10-CM | POA: Diagnosis not present

## 2018-08-30 DIAGNOSIS — D509 Iron deficiency anemia, unspecified: Secondary | ICD-10-CM | POA: Diagnosis not present

## 2018-08-30 DIAGNOSIS — N2581 Secondary hyperparathyroidism of renal origin: Secondary | ICD-10-CM | POA: Diagnosis not present

## 2018-08-31 DIAGNOSIS — D509 Iron deficiency anemia, unspecified: Secondary | ICD-10-CM | POA: Diagnosis not present

## 2018-08-31 DIAGNOSIS — N186 End stage renal disease: Secondary | ICD-10-CM | POA: Diagnosis not present

## 2018-08-31 DIAGNOSIS — Z992 Dependence on renal dialysis: Secondary | ICD-10-CM | POA: Diagnosis not present

## 2018-08-31 DIAGNOSIS — D631 Anemia in chronic kidney disease: Secondary | ICD-10-CM | POA: Diagnosis not present

## 2018-08-31 DIAGNOSIS — N2581 Secondary hyperparathyroidism of renal origin: Secondary | ICD-10-CM | POA: Diagnosis not present

## 2018-09-01 DIAGNOSIS — D509 Iron deficiency anemia, unspecified: Secondary | ICD-10-CM | POA: Diagnosis not present

## 2018-09-01 DIAGNOSIS — N186 End stage renal disease: Secondary | ICD-10-CM | POA: Diagnosis not present

## 2018-09-01 DIAGNOSIS — D631 Anemia in chronic kidney disease: Secondary | ICD-10-CM | POA: Diagnosis not present

## 2018-09-01 DIAGNOSIS — N2581 Secondary hyperparathyroidism of renal origin: Secondary | ICD-10-CM | POA: Diagnosis not present

## 2018-09-01 DIAGNOSIS — Z992 Dependence on renal dialysis: Secondary | ICD-10-CM | POA: Diagnosis not present

## 2018-09-02 DIAGNOSIS — Z992 Dependence on renal dialysis: Secondary | ICD-10-CM | POA: Diagnosis not present

## 2018-09-02 DIAGNOSIS — D631 Anemia in chronic kidney disease: Secondary | ICD-10-CM | POA: Diagnosis not present

## 2018-09-02 DIAGNOSIS — N186 End stage renal disease: Secondary | ICD-10-CM | POA: Diagnosis not present

## 2018-09-02 DIAGNOSIS — N2581 Secondary hyperparathyroidism of renal origin: Secondary | ICD-10-CM | POA: Diagnosis not present

## 2018-09-02 DIAGNOSIS — D509 Iron deficiency anemia, unspecified: Secondary | ICD-10-CM | POA: Diagnosis not present

## 2018-09-03 DIAGNOSIS — N2581 Secondary hyperparathyroidism of renal origin: Secondary | ICD-10-CM | POA: Diagnosis not present

## 2018-09-03 DIAGNOSIS — Z992 Dependence on renal dialysis: Secondary | ICD-10-CM | POA: Diagnosis not present

## 2018-09-03 DIAGNOSIS — D631 Anemia in chronic kidney disease: Secondary | ICD-10-CM | POA: Diagnosis not present

## 2018-09-03 DIAGNOSIS — Z20828 Contact with and (suspected) exposure to other viral communicable diseases: Secondary | ICD-10-CM | POA: Diagnosis not present

## 2018-09-03 DIAGNOSIS — N186 End stage renal disease: Secondary | ICD-10-CM | POA: Diagnosis not present

## 2018-09-03 DIAGNOSIS — D509 Iron deficiency anemia, unspecified: Secondary | ICD-10-CM | POA: Diagnosis not present

## 2018-09-03 DIAGNOSIS — Z1159 Encounter for screening for other viral diseases: Secondary | ICD-10-CM | POA: Diagnosis not present

## 2018-09-04 DIAGNOSIS — Z992 Dependence on renal dialysis: Secondary | ICD-10-CM | POA: Diagnosis not present

## 2018-09-04 DIAGNOSIS — D509 Iron deficiency anemia, unspecified: Secondary | ICD-10-CM | POA: Diagnosis not present

## 2018-09-04 DIAGNOSIS — N2581 Secondary hyperparathyroidism of renal origin: Secondary | ICD-10-CM | POA: Diagnosis not present

## 2018-09-04 DIAGNOSIS — D631 Anemia in chronic kidney disease: Secondary | ICD-10-CM | POA: Diagnosis not present

## 2018-09-04 DIAGNOSIS — N186 End stage renal disease: Secondary | ICD-10-CM | POA: Diagnosis not present

## 2018-09-05 DIAGNOSIS — N2581 Secondary hyperparathyroidism of renal origin: Secondary | ICD-10-CM | POA: Diagnosis not present

## 2018-09-05 DIAGNOSIS — Z992 Dependence on renal dialysis: Secondary | ICD-10-CM | POA: Diagnosis not present

## 2018-09-05 DIAGNOSIS — D509 Iron deficiency anemia, unspecified: Secondary | ICD-10-CM | POA: Diagnosis not present

## 2018-09-05 DIAGNOSIS — D631 Anemia in chronic kidney disease: Secondary | ICD-10-CM | POA: Diagnosis not present

## 2018-09-05 DIAGNOSIS — N186 End stage renal disease: Secondary | ICD-10-CM | POA: Diagnosis not present

## 2018-09-06 ENCOUNTER — Telehealth: Payer: Self-pay | Admitting: Cardiovascular Disease

## 2018-09-06 DIAGNOSIS — D631 Anemia in chronic kidney disease: Secondary | ICD-10-CM | POA: Diagnosis not present

## 2018-09-06 DIAGNOSIS — N186 End stage renal disease: Secondary | ICD-10-CM | POA: Diagnosis not present

## 2018-09-06 DIAGNOSIS — D509 Iron deficiency anemia, unspecified: Secondary | ICD-10-CM | POA: Diagnosis not present

## 2018-09-06 DIAGNOSIS — Z992 Dependence on renal dialysis: Secondary | ICD-10-CM | POA: Diagnosis not present

## 2018-09-06 DIAGNOSIS — N2581 Secondary hyperparathyroidism of renal origin: Secondary | ICD-10-CM | POA: Diagnosis not present

## 2018-09-06 NOTE — Telephone Encounter (Signed)
Virtual Visit Pre-Appointment Phone Call  "(Name), I am calling you today to discuss your upcoming appointment. We are currently trying to limit exposure to the virus that causes COVID-19 by seeing patients at home rather than in the office."  1. "What is the BEST phone number to call the day of the visit?" - include this in appointment notes  2. Do you have or have access to (through a family member/friend) a smartphone with video capability that we can use for your visit?" a. If yes - list this number in appt notes as cell (if different from BEST phone #) and list the appointment type as a VIDEO visit in appointment notes b. If no - list the appointment type as a PHONE visit in appointment notes  3. Confirm consent - "In the setting of the current Covid19 crisis, you are scheduled for a (phone or video) visit with your provider on (date) at (time).  Just as we do with many in-office visits, in order for you to participate in this visit, we must obtain consent.  If you'd like, I can send this to your mychart (if signed up) or email for you to review.  Otherwise, I can obtain your verbal consent now.  All virtual visits are billed to your insurance company just like a normal visit would be.  By agreeing to a virtual visit, we'd like you to understand that the technology does not allow for your provider to perform an examination, and thus may limit your provider's ability to fully assess your condition. If your provider identifies any concerns that need to be evaluated in person, we will make arrangements to do so.  Finally, though the technology is pretty good, we cannot assure that it will always work on either your or our end, and in the setting of a video visit, we may have to convert it to a phone-only visit.  In either situation, we cannot ensure that we have a secure connection.  Are you willing to proceed?" STAFF: Did the patient verbally acknowledge consent to telehealth visit? Document  YES/NO here: yes  4. Advise patient to be prepared - "Two hours prior to your appointment, go ahead and check your blood pressure, pulse, oxygen saturation, and your weight (if you have the equipment to check those) and write them all down. When your visit starts, your provider will ask you for this information. If you have an Apple Watch or Kardia device, please plan to have heart rate information ready on the day of your appointment. Please have a pen and paper handy nearby the day of the visit as well."  5. Give patient instructions for MyChart download to smartphone OR Doximity/Doxy.me as below if video visit (depending on what platform provider is using)  6. Inform patient they will receive a phone call 15 minutes prior to their appointment time (may be from unknown caller ID) so they should be prepared to answer    TELEPHONE CALL NOTE  Jesus [name] has been deemed a candidate for a follow-up tele-health visit to limit community exposure during the Covid-19 pandemic. I spoke with the patient via phone to ensure availability of phone/video source, confirm preferred email & phone number, and discuss instructions and expectations.  I reminded Jesus [name] to be prepared with any vital sign and/or heart rhythm information that could potentially be obtained via home monitoring, at the time of his visit. I reminded Jesus [name] to expect a phone call prior to  his visit.  Jesus [name] 09/06/2018 4:15 PM   INSTRUCTIONS FOR DOWNLOADING THE MYCHART APP TO SMARTPHONE  - The patient must first make sure to have activated MyChart and know their login information - If Apple, go to [employer] and type in MyChart in the search bar and download the app. If Android, ask patient to go to [employer] and type in [place] in the search bar and download the app. The app is free but as with any other app downloads, their phone may require them to verify saved payment information or Apple/Android  password.  - The patient will need to then log into the app with their MyChart username and password, and select [facility] as their healthcare provider to link the account. When it is time for your visit, go to the MyChart app, find appointments, and click Begin Video Visit. Be sure to Select Allow for your device to access the Microphone and Camera for your visit. You will then be connected, and your provider will be with you shortly.  **If they have any issues connecting, or need assistance please contact MyChart service desk [phone])**  **If using a computer, in order to ensure the best quality for their visit they will need to use either of the following Internet Browsers: [employer], or Google Chrome**  IF USING DOXIMITY or DOXY.ME - The patient will receive a link just prior to their visit by text.     FULL LENGTH CONSENT FOR TELE-HEALTH VISIT   I hereby voluntarily request, consent and authorize [facility] and its employed or contracted physicians, physician assistants, nurse practitioners or other licensed health care professionals (the Practitioner), to provide me with telemedicine health care services (the Services") as deemed necessary by the treating Practitioner. I acknowledge and consent to receive the Services by the Practitioner via telemedicine. I understand that the telemedicine visit will involve communicating with the Practitioner through live audiovisual communication technology and the disclosure of certain medical information by electronic transmission. I acknowledge that I have been given the opportunity to request an in-person assessment or other available alternative prior to the telemedicine visit and am voluntarily participating in the telemedicine visit.  I understand that I have the right to withhold or withdraw my consent to the use of telemedicine in the course of my care at any time, without affecting my right to future care or treatment,  and that the Practitioner or I may terminate the telemedicine visit at any time. I understand that I have the right to inspect all information obtained and/or recorded in the course of the telemedicine visit and may receive copies of available information for a reasonable fee.  I understand that some of the potential risks of receiving the Services via telemedicine include:   Delay or interruption in medical evaluation due to technological equipment failure or disruption;  Information transmitted may not be sufficient (e.g. poor resolution of images) to allow for appropriate medical decision making by the Practitioner; and/or   In rare instances, security protocols could fail, causing a breach of personal health information.  Furthermore, I acknowledge that it is my responsibility to provide information about my medical history, conditions and care that is complete and accurate to the best of my ability. I acknowledge that Practitioner's advice, recommendations, and/or decision may be based on factors not within their control, such as incomplete or inaccurate data provided by me or distortions of diagnostic images or specimens that may result from electronic transmissions. I  understand that the practice of medicine is not an exact science and that Practitioner makes no warranties or guarantees regarding treatment outcomes. I acknowledge that I will receive a copy of this consent concurrently upon execution via email to the email address I last provided but may also request a printed copy by calling the office of [facility] [facility].    I understand that my insurance will be billed for this visit.   I have read or had this consent read to me.  I understand the contents of this consent, which adequately explains the benefits and risks of the Services being provided via telemedicine.   I have been provided ample opportunity to ask questions regarding this consent and the Services and have had my questions  answered to my satisfaction.  I give my informed consent for the services to be provided through the use of telemedicine in my medical care  By participating in this telemedicine visit I agree to the above.

## 2018-09-07 DIAGNOSIS — Z992 Dependence on renal dialysis: Secondary | ICD-10-CM | POA: Diagnosis not present

## 2018-09-07 DIAGNOSIS — D509 Iron deficiency anemia, unspecified: Secondary | ICD-10-CM | POA: Diagnosis not present

## 2018-09-07 DIAGNOSIS — N2581 Secondary hyperparathyroidism of renal origin: Secondary | ICD-10-CM | POA: Diagnosis not present

## 2018-09-07 DIAGNOSIS — D631 Anemia in chronic kidney disease: Secondary | ICD-10-CM | POA: Diagnosis not present

## 2018-09-07 DIAGNOSIS — N186 End stage renal disease: Secondary | ICD-10-CM | POA: Diagnosis not present

## 2018-09-08 DIAGNOSIS — D509 Iron deficiency anemia, unspecified: Secondary | ICD-10-CM | POA: Diagnosis not present

## 2018-09-08 DIAGNOSIS — Z992 Dependence on renal dialysis: Secondary | ICD-10-CM | POA: Diagnosis not present

## 2018-09-08 DIAGNOSIS — N2581 Secondary hyperparathyroidism of renal origin: Secondary | ICD-10-CM | POA: Diagnosis not present

## 2018-09-08 DIAGNOSIS — D631 Anemia in chronic kidney disease: Secondary | ICD-10-CM | POA: Diagnosis not present

## 2018-09-08 DIAGNOSIS — N186 End stage renal disease: Secondary | ICD-10-CM | POA: Diagnosis not present

## 2018-09-09 DIAGNOSIS — N2581 Secondary hyperparathyroidism of renal origin: Secondary | ICD-10-CM | POA: Diagnosis not present

## 2018-09-09 DIAGNOSIS — N186 End stage renal disease: Secondary | ICD-10-CM | POA: Diagnosis not present

## 2018-09-09 DIAGNOSIS — D509 Iron deficiency anemia, unspecified: Secondary | ICD-10-CM | POA: Diagnosis not present

## 2018-09-09 DIAGNOSIS — Z992 Dependence on renal dialysis: Secondary | ICD-10-CM | POA: Diagnosis not present

## 2018-09-09 DIAGNOSIS — D631 Anemia in chronic kidney disease: Secondary | ICD-10-CM | POA: Diagnosis not present

## 2018-09-10 DIAGNOSIS — N186 End stage renal disease: Secondary | ICD-10-CM | POA: Diagnosis not present

## 2018-09-10 DIAGNOSIS — D509 Iron deficiency anemia, unspecified: Secondary | ICD-10-CM | POA: Diagnosis not present

## 2018-09-10 DIAGNOSIS — D631 Anemia in chronic kidney disease: Secondary | ICD-10-CM | POA: Diagnosis not present

## 2018-09-10 DIAGNOSIS — Z992 Dependence on renal dialysis: Secondary | ICD-10-CM | POA: Diagnosis not present

## 2018-09-10 DIAGNOSIS — N2581 Secondary hyperparathyroidism of renal origin: Secondary | ICD-10-CM | POA: Diagnosis not present

## 2018-09-11 DIAGNOSIS — N2581 Secondary hyperparathyroidism of renal origin: Secondary | ICD-10-CM | POA: Diagnosis not present

## 2018-09-11 DIAGNOSIS — Z992 Dependence on renal dialysis: Secondary | ICD-10-CM | POA: Diagnosis not present

## 2018-09-11 DIAGNOSIS — D509 Iron deficiency anemia, unspecified: Secondary | ICD-10-CM | POA: Diagnosis not present

## 2018-09-11 DIAGNOSIS — D631 Anemia in chronic kidney disease: Secondary | ICD-10-CM | POA: Diagnosis not present

## 2018-09-11 DIAGNOSIS — N186 End stage renal disease: Secondary | ICD-10-CM | POA: Diagnosis not present

## 2018-09-12 DIAGNOSIS — D509 Iron deficiency anemia, unspecified: Secondary | ICD-10-CM | POA: Diagnosis not present

## 2018-09-12 DIAGNOSIS — N2581 Secondary hyperparathyroidism of renal origin: Secondary | ICD-10-CM | POA: Diagnosis not present

## 2018-09-12 DIAGNOSIS — Z992 Dependence on renal dialysis: Secondary | ICD-10-CM | POA: Diagnosis not present

## 2018-09-12 DIAGNOSIS — N186 End stage renal disease: Secondary | ICD-10-CM | POA: Diagnosis not present

## 2018-09-12 DIAGNOSIS — D631 Anemia in chronic kidney disease: Secondary | ICD-10-CM | POA: Diagnosis not present

## 2018-09-13 ENCOUNTER — Encounter: Payer: Self-pay | Admitting: Cardiovascular Disease

## 2018-09-13 ENCOUNTER — Telehealth (INDEPENDENT_AMBULATORY_CARE_PROVIDER_SITE_OTHER): Payer: Medicare Other | Admitting: Cardiovascular Disease

## 2018-09-13 VITALS — BP 160/88 | HR 71 | Ht 70.0 in | Wt 245.0 lb

## 2018-09-13 DIAGNOSIS — I951 Orthostatic hypotension: Secondary | ICD-10-CM

## 2018-09-13 DIAGNOSIS — N186 End stage renal disease: Secondary | ICD-10-CM

## 2018-09-13 DIAGNOSIS — Z992 Dependence on renal dialysis: Secondary | ICD-10-CM | POA: Diagnosis not present

## 2018-09-13 DIAGNOSIS — D631 Anemia in chronic kidney disease: Secondary | ICD-10-CM | POA: Diagnosis not present

## 2018-09-13 DIAGNOSIS — D509 Iron deficiency anemia, unspecified: Secondary | ICD-10-CM | POA: Diagnosis not present

## 2018-09-13 DIAGNOSIS — N2581 Secondary hyperparathyroidism of renal origin: Secondary | ICD-10-CM | POA: Diagnosis not present

## 2018-09-13 DIAGNOSIS — I10 Essential (primary) hypertension: Secondary | ICD-10-CM

## 2018-09-13 MED ORDER — AMLODIPINE BESYLATE 5 MG PO TABS: 5.0000 mg | ORAL_TABLET | Freq: Two times a day (BID) | ORAL | 1 refills | Status: DC

## 2018-09-13 NOTE — Patient Instructions (Signed)
Medication Instructions:  Continue all current medications.  Labwork: none  Testing/Procedures: none  Follow-Up: As needed.    Any Other Special Instructions Will Be Listed Below (If Applicable).  If you need a refill on your cardiac medications before your next appointment, please call your pharmacy.  

## 2018-09-13 NOTE — Progress Notes (Signed)
Virtual Visit via Telephone Note   This visit type was conducted due to national recommendations for restrictions regarding the COVID-19 Pandemic (e.g. social distancing) in an effort to limit this patient's exposure and mitigate transmission in our community.  Due to his co-morbid illnesses, this patient is at least at moderate risk for complications without adequate follow up.  This format is felt to be most appropriate for this patient at this time.  The patient did not have access to video technology/had technical difficulties with video requiring transitioning to audio format only (telephone).  All issues noted in this document were discussed and addressed.  No physical exam could be performed with this format.  Please refer to the patient's chart for his  consent to telehealth for [facility].   Date:  09/13/2018   ID:  Jesus [name], DOB Jan 20, 1951, MRN [id number]  Patient Location: Home Provider Location: Home  PCP:  Patient, No Pcp Per  Cardiologist:  Kate Sable, MD  Electrophysiologist:  None   Evaluation Performed:  Follow-Up Visit  Chief Complaint:  Wants cardiac cath  History of Present Illness:    Jesus [name] is a 68 y.o. male with orthostatic hypotension and end-stage renal disease. He performs home peritoneal dialysis daily.  He denies chest pain. He underwent cardiac catheterization last week at [facility] and was told he has no significant disease.  The patient does not have symptoms concerning for COVID-19 infection (fever, chills, cough, or new shortness of breath).    Past Medical History:  Diagnosis Date  . Anemia   . Anxiety   . Arthritis   . Cancer [facility])    bladder, ureter, Bil kidneys  . CHF (congestive heart failure) ([place])   . Current moderate episode of major depressive disorder without prior episode ([place]) 06/28/2017  . Dermatitis    scaly bump occurs every few months, pt uses cortizone cream and it goes away  . ESRD (end stage renal disease) on  dialysis ([place])    Bilateral Nephrectomies- due to cancer  . GERD (gastroesophageal reflux disease) 04/07/2015  . History of blood transfusion   . Hypertension   . Lumbar back pain   . Mental disorder   . Non compliance w medication regimen    written in error  . Pleural effusion, right    s/p right thoracentesis 10/07/15 (no malignancy by cytology report)  . Shortness of breath dyspnea    Lying down gets short of breath   Past Surgical History:  Procedure Laterality Date  . AV FISTULA PLACEMENT Right 06/05/2015   Procedure: ARTERIOVENOUS (AV) FISTULA CREATION RIGHT ARM;  Surgeon: Angelia Mould, MD;  Location: [place];  Service: Vascular;  Laterality: Right;  . AV FISTULA PLACEMENT Left 07/25/2017   Procedure: RADIOCEPHALIC  ARTERIOVENOUS FISTULA LEFT ARM;  Surgeon: Conrad [place], MD;  Location: [place];  Service: Vascular;  Laterality: Left;  . BLADDER SURGERY  04-06-2015   removal of bladder/ [facility]   . CAPD INSERTION N/A 06/24/2016   Procedure: LAPAROSCOPIC INSERTION CONTINUOUS AMBULATORY PERITONEAL DIALYSIS  (CAPD) CATHETER;  Surgeon: Clovis Riley, MD;  Location: [place];  Service: General;  Laterality: N/A;  . CARPAL TUNNEL RELEASE     left hand x 2; right hand x 1; right elbow  . CERVICAL FUSION     x 2; 2001 & 2013  . EYE SURGERY Bilateral    Cataract removal  . FISTULA SUPERFICIALIZATION Right 10/30/2015   Procedure: SUPERFICIALIZATION BRACHIOCEPHALIC ARTERIOVENOUS  FISTULA Right Arm;  Surgeon: Angelia Mould, MD;  Location: [place];  Service: Vascular;  Laterality: Right;  . Hemocatheter    . IR GENERIC HISTORICAL Right 04/29/2016   IR THROMBECTOMY AV FISTULA W/THROMBOLYSIS/PTA INC/SHUNT/IMG RIGHT 04/29/2016 Arne Cleveland, MD [facility]-INTER[facility]  . IR GENERIC HISTORICAL  04/29/2016   IR US GUIDE VASC ACCESS RIGHT 04/29/2016 Arne Cleveland, MD [facility]INTER[facility]  . IR THORACENTESIS ASP PLEURAL SPACE W/IMG GUIDE  12/06/2016  . KNEE ARTHROSCOPY Bilateral    bilateral;  2013 R; L [phone]  . KNEE ARTHROSCOPY WITH MEDIAL MENISECTOMY Left 02/26/2013   Procedure: KNEE ARTHROSCOPY WITH MEDIAL MENISECTOMY;  Surgeon: Garald Balding, MD;  Location: [place];  Service: Orthopedics;  Laterality: Left;  . LIGATION OF COMPETING BRANCHES OF ARTERIOVENOUS FISTULA Right 10/30/2015   Procedure: LIGATION OF COMPETING BRANCHES OF BRACHIOCEPHALIC ARTERIOVENOUS FISTULA;  Surgeon: Angelia Mould, MD;  Location: [place];  Service: Vascular;  Laterality: Right;  . NEPHRECTOMY Right September 12, 2012  . NEPHRECTOMY Left 04-06-2015   [place] Right 09/07/2015   Procedure: Fistulagram;  Surgeon: Angelia Mould, MD;  Location: [place] CV LAB;  Service: Cardiovascular;  Laterality: Right;  ARM  . PERIPHERAL VASCULAR CATHETERIZATION Right 09/07/2015   Procedure: Peripheral Vascular Balloon Angioplasty;  Surgeon: Angelia Mould, MD;  Location: [place] CV LAB;  Service: Cardiovascular;  Laterality: Right;  upper arm venous  . PROSTATECTOMY  04-06-2015   [place] Bilateral    bilateral; 2005 L  . TOTAL KNEE ARTHROPLASTY  01/24/2012   Procedure: TOTAL KNEE ARTHROPLASTY;  Surgeon: Garald Balding, MD;  Location: [place];  Service: Orthopedics;  Laterality: Right;  RIGHT TOTAL KNEE REPLACEMENT     Current Meds  Medication Sig  . ALPRAZolam (XANAX) 1 MG tablet Take 1 mg by mouth daily as needed for anxiety or sleep.  Marland Kitchen amLODipine (NORVASC) 5 MG tablet Take 5 mg by mouth 2 (two) times daily.  . clobetasol cream (TEMOVATE) 8.75 % Apply 1 application topically 2 (two) times daily as needed (skin irritation).   Marland Kitchen docusate sodium (COLACE) 250 MG capsule Take 500 mg by mouth daily.  . fluocinonide (LIDEX) 0.05 % external solution APPLY SOLUTION TOPICALLY TO AFFECTED AREA ONCE DAILY  . fluticasone (FLONASE) 50 MCG/ACT nasal spray Place 1 spray into both nostrils daily.   Marland Kitchen lanthanum (FOSRENOL) 500 MG chewable tablet Chew 500 mg by mouth 3 (three) times daily with meals.  Marland Kitchen levothyroxine (SYNTHROID, LEVOTHROID) 75 MCG tablet Take 1 tablet by mouth as directed. 1 hour prior to other medications  . mupirocin ointment (BACTROBAN) 2 % Apply 1 application topically as directed. 1-2 times a day to any wounds  . Naproxen Sodium (ALEVE PO) Take by mouth as needed.  Marland Kitchen oxyCODONE-acetaminophen (PERCOCET/ROXICET) 5-325 MG tablet Take 1 tablet by mouth 2 (two) times daily as needed.  [name] Glycol-Propyl Glycol (SYSTANE OP) Place 1 drop into both eyes daily as needed (for dry, itchy eyes).  . triamcinolone cream (KENALOG) 0.1 % Apply 1 application topically 2 (two) times daily. As directed. Do not apply to face or skin folds     Allergies:   Patient has no known allergies.   Social History   Tobacco Use  . Smoking status: Never Smoker  . Smokeless tobacco: Never Used  Substance Use Topics  . Alcohol use: Not Currently    Alcohol/week: 0.0 standard drinks  Comment: heavy drinker in the past  . Drug use: Never     Family Hx: The patient's family history includes Atrial fibrillation in his father; Cancer in his mother; Heart disease in his father; Hypertension in his father; Other in his father.  ROS:   Please see the history of present illness.     All other systems reviewed and are negative.   Prior CV studies:   The following studies were reviewed today:  Study Conclusions  - Left ventricle: The cavity size was normal. Systolic function was normal. The estimated ejection fraction was 55%. Wall motion was normal; there were no regional wall motion abnormalities. Doppler parameters are consistent with abnormal left ventricular relaxation (grade 1 diastolic dysfunction). Mild concentric and moderate focal basal septal hypertrophy. - Mitral valve: There was mild regurgitation. - Left atrium: The atrium was mildly dilated. - Pericardium,  extracardiac: Small posterior pericardial effusion. Features were not consistent with tamponade physiology.  NST: 05/2016  There was no ST segment deviation noted during stress.  Defect 1: There is a medium defect of moderate severity present in the basal inferoseptal, basal inferior, basal inferolateral, mid inferoseptal, mid inferior and apical inferior location.  This is an intermediate risk study.  Findings consistent with probable prior myocardial infarction.  Nuclear stress EF: 39%.  Labs/Other Tests and Data Reviewed:    EKG:  No ECG reviewed.  Recent Labs: 01/31/2018: ALT 14; BUN 64; Creatinine, Ser 18.87; Hemoglobin 11.2; Platelets 269; Potassium 5.7; Sodium 133   Recent Lipid Panel No results found for: CHOL, TRIG, HDL, CHOLHDL, LDLCALC, LDLDIRECT  Wt Readings from Last 3 Encounters:  09/13/18 245 lb (111.1 kg)  04/04/18 250 lb (113.4 kg)  02/02/18 251 lb 1.6 oz (113.9 kg)     Objective:    Vital Signs:  BP (!) 160/88   Pulse 71   Ht 5\' 10"  (1.778 m)   Wt 245 lb (111.1 kg)   BMI 35.15 kg/m    VITAL SIGNS:  reviewed  ASSESSMENT & PLAN:    1.  End-stage renal disease: He performs home peritoneal dialysis daily. He is trying to get on the transplant list. He has already undergone cardiac catheterization at [facility] and was told he doesn't have any significant disease.  2.  Orthostatic hypotension: No longer on midodrine.  He is actually on amlodipine for hypertension and his blood pressure is elevated.  3.  Hypertension: Blood pressure is elevated.  This is managed by nephrology.    COVID-19 Education: The signs and symptoms of COVID-19 were discussed with the patient and how to seek care for testing (follow up with PCP or arrange E-visit).  The importance of social distancing was discussed today.  Time:   Today, I have spent 10 minutes with the patient with telehealth technology discussing the above problems.     Medication Adjustments/Labs and Tests  Ordered: Current medicines are reviewed at length with the patient today.  Concerns regarding medicines are outlined above.   Tests Ordered: No orders of the defined types were placed in this encounter.   Medication Changes: No orders of the defined types were placed in this encounter.   Follow Up: as needed  Signed, Kate Sable, MD  09/13/2018 1:43 PM    [place]

## 2018-09-14 DIAGNOSIS — Z992 Dependence on renal dialysis: Secondary | ICD-10-CM | POA: Diagnosis not present

## 2018-09-14 DIAGNOSIS — D631 Anemia in chronic kidney disease: Secondary | ICD-10-CM | POA: Diagnosis not present

## 2018-09-14 DIAGNOSIS — N186 End stage renal disease: Secondary | ICD-10-CM | POA: Diagnosis not present

## 2018-09-14 DIAGNOSIS — N2581 Secondary hyperparathyroidism of renal origin: Secondary | ICD-10-CM | POA: Diagnosis not present

## 2018-09-14 DIAGNOSIS — D509 Iron deficiency anemia, unspecified: Secondary | ICD-10-CM | POA: Diagnosis not present

## 2018-09-15 DIAGNOSIS — N186 End stage renal disease: Secondary | ICD-10-CM | POA: Diagnosis not present

## 2018-09-15 DIAGNOSIS — N2581 Secondary hyperparathyroidism of renal origin: Secondary | ICD-10-CM | POA: Diagnosis not present

## 2018-09-15 DIAGNOSIS — D509 Iron deficiency anemia, unspecified: Secondary | ICD-10-CM | POA: Diagnosis not present

## 2018-09-15 DIAGNOSIS — D631 Anemia in chronic kidney disease: Secondary | ICD-10-CM | POA: Diagnosis not present

## 2018-09-15 DIAGNOSIS — Z992 Dependence on renal dialysis: Secondary | ICD-10-CM | POA: Diagnosis not present

## 2018-09-16 DIAGNOSIS — N2581 Secondary hyperparathyroidism of renal origin: Secondary | ICD-10-CM | POA: Diagnosis not present

## 2018-09-16 DIAGNOSIS — N186 End stage renal disease: Secondary | ICD-10-CM | POA: Diagnosis not present

## 2018-09-16 DIAGNOSIS — Z992 Dependence on renal dialysis: Secondary | ICD-10-CM | POA: Diagnosis not present

## 2018-09-16 DIAGNOSIS — D631 Anemia in chronic kidney disease: Secondary | ICD-10-CM | POA: Diagnosis not present

## 2018-09-16 DIAGNOSIS — D509 Iron deficiency anemia, unspecified: Secondary | ICD-10-CM | POA: Diagnosis not present

## 2018-09-17 DIAGNOSIS — N186 End stage renal disease: Secondary | ICD-10-CM | POA: Diagnosis not present

## 2018-09-17 DIAGNOSIS — D631 Anemia in chronic kidney disease: Secondary | ICD-10-CM | POA: Diagnosis not present

## 2018-09-17 DIAGNOSIS — D509 Iron deficiency anemia, unspecified: Secondary | ICD-10-CM | POA: Diagnosis not present

## 2018-09-17 DIAGNOSIS — N2581 Secondary hyperparathyroidism of renal origin: Secondary | ICD-10-CM | POA: Diagnosis not present

## 2018-09-17 DIAGNOSIS — Z992 Dependence on renal dialysis: Secondary | ICD-10-CM | POA: Diagnosis not present

## 2018-09-18 DIAGNOSIS — D631 Anemia in chronic kidney disease: Secondary | ICD-10-CM | POA: Diagnosis not present

## 2018-09-18 DIAGNOSIS — Z992 Dependence on renal dialysis: Secondary | ICD-10-CM | POA: Diagnosis not present

## 2018-09-18 DIAGNOSIS — D509 Iron deficiency anemia, unspecified: Secondary | ICD-10-CM | POA: Diagnosis not present

## 2018-09-18 DIAGNOSIS — N186 End stage renal disease: Secondary | ICD-10-CM | POA: Diagnosis not present

## 2018-09-18 DIAGNOSIS — N2581 Secondary hyperparathyroidism of renal origin: Secondary | ICD-10-CM | POA: Diagnosis not present

## 2018-09-19 DIAGNOSIS — D509 Iron deficiency anemia, unspecified: Secondary | ICD-10-CM | POA: Diagnosis not present

## 2018-09-19 DIAGNOSIS — D631 Anemia in chronic kidney disease: Secondary | ICD-10-CM | POA: Diagnosis not present

## 2018-09-19 DIAGNOSIS — N2581 Secondary hyperparathyroidism of renal origin: Secondary | ICD-10-CM | POA: Diagnosis not present

## 2018-09-19 DIAGNOSIS — N186 End stage renal disease: Secondary | ICD-10-CM | POA: Diagnosis not present

## 2018-09-19 DIAGNOSIS — Z992 Dependence on renal dialysis: Secondary | ICD-10-CM | POA: Diagnosis not present

## 2018-09-20 DIAGNOSIS — N2581 Secondary hyperparathyroidism of renal origin: Secondary | ICD-10-CM | POA: Diagnosis not present

## 2018-09-20 DIAGNOSIS — Z992 Dependence on renal dialysis: Secondary | ICD-10-CM | POA: Diagnosis not present

## 2018-09-20 DIAGNOSIS — N186 End stage renal disease: Secondary | ICD-10-CM | POA: Diagnosis not present

## 2018-09-20 DIAGNOSIS — D509 Iron deficiency anemia, unspecified: Secondary | ICD-10-CM | POA: Diagnosis not present

## 2018-09-20 DIAGNOSIS — D631 Anemia in chronic kidney disease: Secondary | ICD-10-CM | POA: Diagnosis not present

## 2018-09-21 DIAGNOSIS — Z1211 Encounter for screening for malignant neoplasm of colon: Secondary | ICD-10-CM | POA: Diagnosis not present

## 2018-09-21 DIAGNOSIS — D631 Anemia in chronic kidney disease: Secondary | ICD-10-CM | POA: Diagnosis not present

## 2018-09-21 DIAGNOSIS — N2581 Secondary hyperparathyroidism of renal origin: Secondary | ICD-10-CM | POA: Diagnosis not present

## 2018-09-21 DIAGNOSIS — Z992 Dependence on renal dialysis: Secondary | ICD-10-CM | POA: Diagnosis not present

## 2018-09-21 DIAGNOSIS — N186 End stage renal disease: Secondary | ICD-10-CM | POA: Diagnosis not present

## 2018-09-21 DIAGNOSIS — D509 Iron deficiency anemia, unspecified: Secondary | ICD-10-CM | POA: Diagnosis not present

## 2018-09-22 DIAGNOSIS — D509 Iron deficiency anemia, unspecified: Secondary | ICD-10-CM | POA: Diagnosis not present

## 2018-09-22 DIAGNOSIS — N186 End stage renal disease: Secondary | ICD-10-CM | POA: Diagnosis not present

## 2018-09-22 DIAGNOSIS — D631 Anemia in chronic kidney disease: Secondary | ICD-10-CM | POA: Diagnosis not present

## 2018-09-22 DIAGNOSIS — Z992 Dependence on renal dialysis: Secondary | ICD-10-CM | POA: Diagnosis not present

## 2018-09-22 DIAGNOSIS — N2581 Secondary hyperparathyroidism of renal origin: Secondary | ICD-10-CM | POA: Diagnosis not present

## 2018-09-23 DIAGNOSIS — D631 Anemia in chronic kidney disease: Secondary | ICD-10-CM | POA: Diagnosis not present

## 2018-09-23 DIAGNOSIS — Z992 Dependence on renal dialysis: Secondary | ICD-10-CM | POA: Diagnosis not present

## 2018-09-23 DIAGNOSIS — D509 Iron deficiency anemia, unspecified: Secondary | ICD-10-CM | POA: Diagnosis not present

## 2018-09-23 DIAGNOSIS — N186 End stage renal disease: Secondary | ICD-10-CM | POA: Diagnosis not present

## 2018-09-23 DIAGNOSIS — N2581 Secondary hyperparathyroidism of renal origin: Secondary | ICD-10-CM | POA: Diagnosis not present

## 2018-09-24 DIAGNOSIS — D509 Iron deficiency anemia, unspecified: Secondary | ICD-10-CM | POA: Diagnosis not present

## 2018-09-24 DIAGNOSIS — D631 Anemia in chronic kidney disease: Secondary | ICD-10-CM | POA: Diagnosis not present

## 2018-09-24 DIAGNOSIS — Z992 Dependence on renal dialysis: Secondary | ICD-10-CM | POA: Diagnosis not present

## 2018-09-24 DIAGNOSIS — N2581 Secondary hyperparathyroidism of renal origin: Secondary | ICD-10-CM | POA: Diagnosis not present

## 2018-09-24 DIAGNOSIS — N186 End stage renal disease: Secondary | ICD-10-CM | POA: Diagnosis not present

## 2018-09-25 DIAGNOSIS — D509 Iron deficiency anemia, unspecified: Secondary | ICD-10-CM | POA: Diagnosis not present

## 2018-09-25 DIAGNOSIS — N186 End stage renal disease: Secondary | ICD-10-CM | POA: Diagnosis not present

## 2018-09-25 DIAGNOSIS — Z992 Dependence on renal dialysis: Secondary | ICD-10-CM | POA: Diagnosis not present

## 2018-09-25 DIAGNOSIS — N2581 Secondary hyperparathyroidism of renal origin: Secondary | ICD-10-CM | POA: Diagnosis not present

## 2018-09-25 DIAGNOSIS — D631 Anemia in chronic kidney disease: Secondary | ICD-10-CM | POA: Diagnosis not present

## 2018-09-26 DIAGNOSIS — N186 End stage renal disease: Secondary | ICD-10-CM | POA: Diagnosis not present

## 2018-09-26 DIAGNOSIS — Z992 Dependence on renal dialysis: Secondary | ICD-10-CM | POA: Diagnosis not present

## 2018-09-26 DIAGNOSIS — D631 Anemia in chronic kidney disease: Secondary | ICD-10-CM | POA: Diagnosis not present

## 2018-09-26 DIAGNOSIS — N2581 Secondary hyperparathyroidism of renal origin: Secondary | ICD-10-CM | POA: Diagnosis not present

## 2018-09-26 DIAGNOSIS — D509 Iron deficiency anemia, unspecified: Secondary | ICD-10-CM | POA: Diagnosis not present

## 2018-09-27 DIAGNOSIS — N2581 Secondary hyperparathyroidism of renal origin: Secondary | ICD-10-CM | POA: Diagnosis not present

## 2018-09-27 DIAGNOSIS — N186 End stage renal disease: Secondary | ICD-10-CM | POA: Diagnosis not present

## 2018-09-27 DIAGNOSIS — D509 Iron deficiency anemia, unspecified: Secondary | ICD-10-CM | POA: Diagnosis not present

## 2018-09-27 DIAGNOSIS — D631 Anemia in chronic kidney disease: Secondary | ICD-10-CM | POA: Diagnosis not present

## 2018-09-27 DIAGNOSIS — Z992 Dependence on renal dialysis: Secondary | ICD-10-CM | POA: Diagnosis not present

## 2018-09-28 DIAGNOSIS — D509 Iron deficiency anemia, unspecified: Secondary | ICD-10-CM | POA: Diagnosis not present

## 2018-09-28 DIAGNOSIS — N186 End stage renal disease: Secondary | ICD-10-CM | POA: Diagnosis not present

## 2018-09-28 DIAGNOSIS — D631 Anemia in chronic kidney disease: Secondary | ICD-10-CM | POA: Diagnosis not present

## 2018-09-28 DIAGNOSIS — N2581 Secondary hyperparathyroidism of renal origin: Secondary | ICD-10-CM | POA: Diagnosis not present

## 2018-09-28 DIAGNOSIS — Z992 Dependence on renal dialysis: Secondary | ICD-10-CM | POA: Diagnosis not present

## 2018-09-29 DIAGNOSIS — N186 End stage renal disease: Secondary | ICD-10-CM | POA: Diagnosis not present

## 2018-09-29 DIAGNOSIS — D509 Iron deficiency anemia, unspecified: Secondary | ICD-10-CM | POA: Diagnosis not present

## 2018-09-29 DIAGNOSIS — Z992 Dependence on renal dialysis: Secondary | ICD-10-CM | POA: Diagnosis not present

## 2018-09-29 DIAGNOSIS — D631 Anemia in chronic kidney disease: Secondary | ICD-10-CM | POA: Diagnosis not present

## 2018-09-30 DIAGNOSIS — D509 Iron deficiency anemia, unspecified: Secondary | ICD-10-CM | POA: Diagnosis not present

## 2018-09-30 DIAGNOSIS — Z992 Dependence on renal dialysis: Secondary | ICD-10-CM | POA: Diagnosis not present

## 2018-09-30 DIAGNOSIS — N186 End stage renal disease: Secondary | ICD-10-CM | POA: Diagnosis not present

## 2018-09-30 DIAGNOSIS — D631 Anemia in chronic kidney disease: Secondary | ICD-10-CM | POA: Diagnosis not present

## 2018-10-01 DIAGNOSIS — Z992 Dependence on renal dialysis: Secondary | ICD-10-CM | POA: Diagnosis not present

## 2018-10-01 DIAGNOSIS — D631 Anemia in chronic kidney disease: Secondary | ICD-10-CM | POA: Diagnosis not present

## 2018-10-01 DIAGNOSIS — Z1159 Encounter for screening for other viral diseases: Secondary | ICD-10-CM | POA: Diagnosis not present

## 2018-10-01 DIAGNOSIS — D509 Iron deficiency anemia, unspecified: Secondary | ICD-10-CM | POA: Diagnosis not present

## 2018-10-01 DIAGNOSIS — N186 End stage renal disease: Secondary | ICD-10-CM | POA: Diagnosis not present

## 2018-10-02 DIAGNOSIS — Z992 Dependence on renal dialysis: Secondary | ICD-10-CM | POA: Diagnosis not present

## 2018-10-02 DIAGNOSIS — D631 Anemia in chronic kidney disease: Secondary | ICD-10-CM | POA: Diagnosis not present

## 2018-10-02 DIAGNOSIS — N186 End stage renal disease: Secondary | ICD-10-CM | POA: Diagnosis not present

## 2018-10-02 DIAGNOSIS — D509 Iron deficiency anemia, unspecified: Secondary | ICD-10-CM | POA: Diagnosis not present

## 2018-10-03 DIAGNOSIS — E669 Obesity, unspecified: Secondary | ICD-10-CM | POA: Diagnosis not present

## 2018-10-03 DIAGNOSIS — F419 Anxiety disorder, unspecified: Secondary | ICD-10-CM | POA: Diagnosis not present

## 2018-10-03 DIAGNOSIS — Z79899 Other long term (current) drug therapy: Secondary | ICD-10-CM | POA: Diagnosis not present

## 2018-10-03 DIAGNOSIS — N186 End stage renal disease: Secondary | ICD-10-CM | POA: Diagnosis not present

## 2018-10-03 DIAGNOSIS — N189 Chronic kidney disease, unspecified: Secondary | ICD-10-CM | POA: Diagnosis not present

## 2018-10-03 DIAGNOSIS — K635 Polyp of colon: Secondary | ICD-10-CM | POA: Diagnosis not present

## 2018-10-03 DIAGNOSIS — Z6835 Body mass index (BMI) 35.0-35.9, adult: Secondary | ICD-10-CM | POA: Diagnosis not present

## 2018-10-03 DIAGNOSIS — D126 Benign neoplasm of colon, unspecified: Secondary | ICD-10-CM | POA: Diagnosis not present

## 2018-10-03 DIAGNOSIS — D120 Benign neoplasm of cecum: Secondary | ICD-10-CM | POA: Diagnosis not present

## 2018-10-03 DIAGNOSIS — D631 Anemia in chronic kidney disease: Secondary | ICD-10-CM | POA: Diagnosis not present

## 2018-10-03 DIAGNOSIS — Z1211 Encounter for screening for malignant neoplasm of colon: Secondary | ICD-10-CM | POA: Diagnosis not present

## 2018-10-03 DIAGNOSIS — F329 Major depressive disorder, single episode, unspecified: Secondary | ICD-10-CM | POA: Diagnosis not present

## 2018-10-03 DIAGNOSIS — D509 Iron deficiency anemia, unspecified: Secondary | ICD-10-CM | POA: Diagnosis not present

## 2018-10-03 DIAGNOSIS — I129 Hypertensive chronic kidney disease with stage 1 through stage 4 chronic kidney disease, or unspecified chronic kidney disease: Secondary | ICD-10-CM | POA: Diagnosis not present

## 2018-10-03 DIAGNOSIS — Z800 Family history of malignant neoplasm of digestive organs: Secondary | ICD-10-CM | POA: Diagnosis not present

## 2018-10-03 DIAGNOSIS — Z992 Dependence on renal dialysis: Secondary | ICD-10-CM | POA: Diagnosis not present

## 2018-10-03 DIAGNOSIS — Z981 Arthrodesis status: Secondary | ICD-10-CM | POA: Diagnosis not present

## 2018-10-04 DIAGNOSIS — Z992 Dependence on renal dialysis: Secondary | ICD-10-CM | POA: Diagnosis not present

## 2018-10-04 DIAGNOSIS — D631 Anemia in chronic kidney disease: Secondary | ICD-10-CM | POA: Diagnosis not present

## 2018-10-04 DIAGNOSIS — N186 End stage renal disease: Secondary | ICD-10-CM | POA: Diagnosis not present

## 2018-10-04 DIAGNOSIS — D509 Iron deficiency anemia, unspecified: Secondary | ICD-10-CM | POA: Diagnosis not present

## 2018-10-05 DIAGNOSIS — N186 End stage renal disease: Secondary | ICD-10-CM | POA: Diagnosis not present

## 2018-10-05 DIAGNOSIS — Z992 Dependence on renal dialysis: Secondary | ICD-10-CM | POA: Diagnosis not present

## 2018-10-05 DIAGNOSIS — D631 Anemia in chronic kidney disease: Secondary | ICD-10-CM | POA: Diagnosis not present

## 2018-10-05 DIAGNOSIS — D509 Iron deficiency anemia, unspecified: Secondary | ICD-10-CM | POA: Diagnosis not present

## 2018-10-06 DIAGNOSIS — D509 Iron deficiency anemia, unspecified: Secondary | ICD-10-CM | POA: Diagnosis not present

## 2018-10-06 DIAGNOSIS — D631 Anemia in chronic kidney disease: Secondary | ICD-10-CM | POA: Diagnosis not present

## 2018-10-06 DIAGNOSIS — Z992 Dependence on renal dialysis: Secondary | ICD-10-CM | POA: Diagnosis not present

## 2018-10-06 DIAGNOSIS — N186 End stage renal disease: Secondary | ICD-10-CM | POA: Diagnosis not present

## 2018-10-07 DIAGNOSIS — D631 Anemia in chronic kidney disease: Secondary | ICD-10-CM | POA: Diagnosis not present

## 2018-10-07 DIAGNOSIS — D509 Iron deficiency anemia, unspecified: Secondary | ICD-10-CM | POA: Diagnosis not present

## 2018-10-07 DIAGNOSIS — N186 End stage renal disease: Secondary | ICD-10-CM | POA: Diagnosis not present

## 2018-10-07 DIAGNOSIS — Z992 Dependence on renal dialysis: Secondary | ICD-10-CM | POA: Diagnosis not present

## 2018-10-08 DIAGNOSIS — D509 Iron deficiency anemia, unspecified: Secondary | ICD-10-CM | POA: Diagnosis not present

## 2018-10-08 DIAGNOSIS — N186 End stage renal disease: Secondary | ICD-10-CM | POA: Diagnosis not present

## 2018-10-08 DIAGNOSIS — D631 Anemia in chronic kidney disease: Secondary | ICD-10-CM | POA: Diagnosis not present

## 2018-10-08 DIAGNOSIS — Z992 Dependence on renal dialysis: Secondary | ICD-10-CM | POA: Diagnosis not present

## 2018-10-09 DIAGNOSIS — D631 Anemia in chronic kidney disease: Secondary | ICD-10-CM | POA: Diagnosis not present

## 2018-10-09 DIAGNOSIS — D509 Iron deficiency anemia, unspecified: Secondary | ICD-10-CM | POA: Diagnosis not present

## 2018-10-09 DIAGNOSIS — N186 End stage renal disease: Secondary | ICD-10-CM | POA: Diagnosis not present

## 2018-10-09 DIAGNOSIS — Z992 Dependence on renal dialysis: Secondary | ICD-10-CM | POA: Diagnosis not present

## 2018-10-10 DIAGNOSIS — N186 End stage renal disease: Secondary | ICD-10-CM | POA: Diagnosis not present

## 2018-10-10 DIAGNOSIS — D509 Iron deficiency anemia, unspecified: Secondary | ICD-10-CM | POA: Diagnosis not present

## 2018-10-10 DIAGNOSIS — D631 Anemia in chronic kidney disease: Secondary | ICD-10-CM | POA: Diagnosis not present

## 2018-10-10 DIAGNOSIS — Z992 Dependence on renal dialysis: Secondary | ICD-10-CM | POA: Diagnosis not present

## 2018-10-11 DIAGNOSIS — D509 Iron deficiency anemia, unspecified: Secondary | ICD-10-CM | POA: Diagnosis not present

## 2018-10-11 DIAGNOSIS — Z992 Dependence on renal dialysis: Secondary | ICD-10-CM | POA: Diagnosis not present

## 2018-10-11 DIAGNOSIS — D631 Anemia in chronic kidney disease: Secondary | ICD-10-CM | POA: Diagnosis not present

## 2018-10-11 DIAGNOSIS — N186 End stage renal disease: Secondary | ICD-10-CM | POA: Diagnosis not present

## 2018-10-12 DIAGNOSIS — Z992 Dependence on renal dialysis: Secondary | ICD-10-CM | POA: Diagnosis not present

## 2018-10-12 DIAGNOSIS — D509 Iron deficiency anemia, unspecified: Secondary | ICD-10-CM | POA: Diagnosis not present

## 2018-10-12 DIAGNOSIS — N186 End stage renal disease: Secondary | ICD-10-CM | POA: Diagnosis not present

## 2018-10-12 DIAGNOSIS — D631 Anemia in chronic kidney disease: Secondary | ICD-10-CM | POA: Diagnosis not present

## 2018-10-13 DIAGNOSIS — N186 End stage renal disease: Secondary | ICD-10-CM | POA: Diagnosis not present

## 2018-10-13 DIAGNOSIS — Z992 Dependence on renal dialysis: Secondary | ICD-10-CM | POA: Diagnosis not present

## 2018-10-13 DIAGNOSIS — D631 Anemia in chronic kidney disease: Secondary | ICD-10-CM | POA: Diagnosis not present

## 2018-10-13 DIAGNOSIS — D509 Iron deficiency anemia, unspecified: Secondary | ICD-10-CM | POA: Diagnosis not present

## 2018-10-14 DIAGNOSIS — Z992 Dependence on renal dialysis: Secondary | ICD-10-CM | POA: Diagnosis not present

## 2018-10-14 DIAGNOSIS — N186 End stage renal disease: Secondary | ICD-10-CM | POA: Diagnosis not present

## 2018-10-14 DIAGNOSIS — D631 Anemia in chronic kidney disease: Secondary | ICD-10-CM | POA: Diagnosis not present

## 2018-10-14 DIAGNOSIS — D509 Iron deficiency anemia, unspecified: Secondary | ICD-10-CM | POA: Diagnosis not present

## 2018-10-15 DIAGNOSIS — D631 Anemia in chronic kidney disease: Secondary | ICD-10-CM | POA: Diagnosis not present

## 2018-10-15 DIAGNOSIS — D509 Iron deficiency anemia, unspecified: Secondary | ICD-10-CM | POA: Diagnosis not present

## 2018-10-15 DIAGNOSIS — N186 End stage renal disease: Secondary | ICD-10-CM | POA: Diagnosis not present

## 2018-10-15 DIAGNOSIS — Z992 Dependence on renal dialysis: Secondary | ICD-10-CM | POA: Diagnosis not present

## 2018-10-16 DIAGNOSIS — N186 End stage renal disease: Secondary | ICD-10-CM | POA: Diagnosis not present

## 2018-10-16 DIAGNOSIS — D509 Iron deficiency anemia, unspecified: Secondary | ICD-10-CM | POA: Diagnosis not present

## 2018-10-16 DIAGNOSIS — Z992 Dependence on renal dialysis: Secondary | ICD-10-CM | POA: Diagnosis not present

## 2018-10-16 DIAGNOSIS — I739 Peripheral vascular disease, unspecified: Secondary | ICD-10-CM | POA: Diagnosis not present

## 2018-10-16 DIAGNOSIS — L110 Acquired keratosis follicularis: Secondary | ICD-10-CM | POA: Diagnosis not present

## 2018-10-16 DIAGNOSIS — M79671 Pain in right foot: Secondary | ICD-10-CM | POA: Diagnosis not present

## 2018-10-16 DIAGNOSIS — M79672 Pain in left foot: Secondary | ICD-10-CM | POA: Diagnosis not present

## 2018-10-16 DIAGNOSIS — D631 Anemia in chronic kidney disease: Secondary | ICD-10-CM | POA: Diagnosis not present

## 2018-10-17 DIAGNOSIS — Z992 Dependence on renal dialysis: Secondary | ICD-10-CM | POA: Diagnosis not present

## 2018-10-17 DIAGNOSIS — D631 Anemia in chronic kidney disease: Secondary | ICD-10-CM | POA: Diagnosis not present

## 2018-10-17 DIAGNOSIS — N186 End stage renal disease: Secondary | ICD-10-CM | POA: Diagnosis not present

## 2018-10-17 DIAGNOSIS — D509 Iron deficiency anemia, unspecified: Secondary | ICD-10-CM | POA: Diagnosis not present

## 2018-10-18 DIAGNOSIS — D509 Iron deficiency anemia, unspecified: Secondary | ICD-10-CM | POA: Diagnosis not present

## 2018-10-18 DIAGNOSIS — D631 Anemia in chronic kidney disease: Secondary | ICD-10-CM | POA: Diagnosis not present

## 2018-10-18 DIAGNOSIS — N186 End stage renal disease: Secondary | ICD-10-CM | POA: Diagnosis not present

## 2018-10-18 DIAGNOSIS — Z992 Dependence on renal dialysis: Secondary | ICD-10-CM | POA: Diagnosis not present

## 2018-10-19 DIAGNOSIS — D631 Anemia in chronic kidney disease: Secondary | ICD-10-CM | POA: Diagnosis not present

## 2018-10-19 DIAGNOSIS — D509 Iron deficiency anemia, unspecified: Secondary | ICD-10-CM | POA: Diagnosis not present

## 2018-10-19 DIAGNOSIS — D125 Benign neoplasm of sigmoid colon: Secondary | ICD-10-CM | POA: Diagnosis not present

## 2018-10-19 DIAGNOSIS — N186 End stage renal disease: Secondary | ICD-10-CM | POA: Diagnosis not present

## 2018-10-19 DIAGNOSIS — Z992 Dependence on renal dialysis: Secondary | ICD-10-CM | POA: Diagnosis not present

## 2018-10-20 DIAGNOSIS — N186 End stage renal disease: Secondary | ICD-10-CM | POA: Diagnosis not present

## 2018-10-20 DIAGNOSIS — D509 Iron deficiency anemia, unspecified: Secondary | ICD-10-CM | POA: Diagnosis not present

## 2018-10-20 DIAGNOSIS — Z992 Dependence on renal dialysis: Secondary | ICD-10-CM | POA: Diagnosis not present

## 2018-10-20 DIAGNOSIS — D631 Anemia in chronic kidney disease: Secondary | ICD-10-CM | POA: Diagnosis not present

## 2018-10-21 DIAGNOSIS — D509 Iron deficiency anemia, unspecified: Secondary | ICD-10-CM | POA: Diagnosis not present

## 2018-10-21 DIAGNOSIS — D631 Anemia in chronic kidney disease: Secondary | ICD-10-CM | POA: Diagnosis not present

## 2018-10-21 DIAGNOSIS — N186 End stage renal disease: Secondary | ICD-10-CM | POA: Diagnosis not present

## 2018-10-21 DIAGNOSIS — Z992 Dependence on renal dialysis: Secondary | ICD-10-CM | POA: Diagnosis not present

## 2018-10-22 DIAGNOSIS — D509 Iron deficiency anemia, unspecified: Secondary | ICD-10-CM | POA: Diagnosis not present

## 2018-10-22 DIAGNOSIS — D631 Anemia in chronic kidney disease: Secondary | ICD-10-CM | POA: Diagnosis not present

## 2018-10-22 DIAGNOSIS — Z992 Dependence on renal dialysis: Secondary | ICD-10-CM | POA: Diagnosis not present

## 2018-10-22 DIAGNOSIS — N186 End stage renal disease: Secondary | ICD-10-CM | POA: Diagnosis not present

## 2018-10-23 DIAGNOSIS — D631 Anemia in chronic kidney disease: Secondary | ICD-10-CM | POA: Diagnosis not present

## 2018-10-23 DIAGNOSIS — Z992 Dependence on renal dialysis: Secondary | ICD-10-CM | POA: Diagnosis not present

## 2018-10-23 DIAGNOSIS — D509 Iron deficiency anemia, unspecified: Secondary | ICD-10-CM | POA: Diagnosis not present

## 2018-10-23 DIAGNOSIS — N186 End stage renal disease: Secondary | ICD-10-CM | POA: Diagnosis not present

## 2018-10-24 DIAGNOSIS — D509 Iron deficiency anemia, unspecified: Secondary | ICD-10-CM | POA: Diagnosis not present

## 2018-10-24 DIAGNOSIS — Z992 Dependence on renal dialysis: Secondary | ICD-10-CM | POA: Diagnosis not present

## 2018-10-24 DIAGNOSIS — D631 Anemia in chronic kidney disease: Secondary | ICD-10-CM | POA: Diagnosis not present

## 2018-10-24 DIAGNOSIS — N186 End stage renal disease: Secondary | ICD-10-CM | POA: Diagnosis not present

## 2018-10-25 DIAGNOSIS — D509 Iron deficiency anemia, unspecified: Secondary | ICD-10-CM | POA: Diagnosis not present

## 2018-10-25 DIAGNOSIS — Z992 Dependence on renal dialysis: Secondary | ICD-10-CM | POA: Diagnosis not present

## 2018-10-25 DIAGNOSIS — N186 End stage renal disease: Secondary | ICD-10-CM | POA: Diagnosis not present

## 2018-10-25 DIAGNOSIS — D631 Anemia in chronic kidney disease: Secondary | ICD-10-CM | POA: Diagnosis not present

## 2018-10-26 DIAGNOSIS — Z992 Dependence on renal dialysis: Secondary | ICD-10-CM | POA: Diagnosis not present

## 2018-10-26 DIAGNOSIS — D631 Anemia in chronic kidney disease: Secondary | ICD-10-CM | POA: Diagnosis not present

## 2018-10-26 DIAGNOSIS — N186 End stage renal disease: Secondary | ICD-10-CM | POA: Diagnosis not present

## 2018-10-26 DIAGNOSIS — D509 Iron deficiency anemia, unspecified: Secondary | ICD-10-CM | POA: Diagnosis not present

## 2018-10-27 DIAGNOSIS — N186 End stage renal disease: Secondary | ICD-10-CM | POA: Diagnosis not present

## 2018-10-27 DIAGNOSIS — Z992 Dependence on renal dialysis: Secondary | ICD-10-CM | POA: Diagnosis not present

## 2018-10-27 DIAGNOSIS — D631 Anemia in chronic kidney disease: Secondary | ICD-10-CM | POA: Diagnosis not present

## 2018-10-27 DIAGNOSIS — D509 Iron deficiency anemia, unspecified: Secondary | ICD-10-CM | POA: Diagnosis not present

## 2018-10-28 DIAGNOSIS — D509 Iron deficiency anemia, unspecified: Secondary | ICD-10-CM | POA: Diagnosis not present

## 2018-10-28 DIAGNOSIS — N186 End stage renal disease: Secondary | ICD-10-CM | POA: Diagnosis not present

## 2018-10-28 DIAGNOSIS — D631 Anemia in chronic kidney disease: Secondary | ICD-10-CM | POA: Diagnosis not present

## 2018-10-28 DIAGNOSIS — Z992 Dependence on renal dialysis: Secondary | ICD-10-CM | POA: Diagnosis not present

## 2018-10-29 DIAGNOSIS — N186 End stage renal disease: Secondary | ICD-10-CM | POA: Diagnosis not present

## 2018-10-29 DIAGNOSIS — D631 Anemia in chronic kidney disease: Secondary | ICD-10-CM | POA: Diagnosis not present

## 2018-10-29 DIAGNOSIS — D509 Iron deficiency anemia, unspecified: Secondary | ICD-10-CM | POA: Diagnosis not present

## 2018-10-29 DIAGNOSIS — Z992 Dependence on renal dialysis: Secondary | ICD-10-CM | POA: Diagnosis not present

## 2018-10-30 DIAGNOSIS — D509 Iron deficiency anemia, unspecified: Secondary | ICD-10-CM | POA: Diagnosis not present

## 2018-10-30 DIAGNOSIS — N2581 Secondary hyperparathyroidism of renal origin: Secondary | ICD-10-CM | POA: Diagnosis not present

## 2018-10-30 DIAGNOSIS — Z992 Dependence on renal dialysis: Secondary | ICD-10-CM | POA: Diagnosis not present

## 2018-10-30 DIAGNOSIS — D631 Anemia in chronic kidney disease: Secondary | ICD-10-CM | POA: Diagnosis not present

## 2018-10-30 DIAGNOSIS — N186 End stage renal disease: Secondary | ICD-10-CM | POA: Diagnosis not present

## 2018-10-31 DIAGNOSIS — Z992 Dependence on renal dialysis: Secondary | ICD-10-CM | POA: Diagnosis not present

## 2018-10-31 DIAGNOSIS — N2581 Secondary hyperparathyroidism of renal origin: Secondary | ICD-10-CM | POA: Diagnosis not present

## 2018-10-31 DIAGNOSIS — D631 Anemia in chronic kidney disease: Secondary | ICD-10-CM | POA: Diagnosis not present

## 2018-10-31 DIAGNOSIS — D509 Iron deficiency anemia, unspecified: Secondary | ICD-10-CM | POA: Diagnosis not present

## 2018-10-31 DIAGNOSIS — N186 End stage renal disease: Secondary | ICD-10-CM | POA: Diagnosis not present

## 2018-11-01 DIAGNOSIS — D509 Iron deficiency anemia, unspecified: Secondary | ICD-10-CM | POA: Diagnosis not present

## 2018-11-01 DIAGNOSIS — N2581 Secondary hyperparathyroidism of renal origin: Secondary | ICD-10-CM | POA: Diagnosis not present

## 2018-11-01 DIAGNOSIS — Z992 Dependence on renal dialysis: Secondary | ICD-10-CM | POA: Diagnosis not present

## 2018-11-01 DIAGNOSIS — N186 End stage renal disease: Secondary | ICD-10-CM | POA: Diagnosis not present

## 2018-11-01 DIAGNOSIS — D631 Anemia in chronic kidney disease: Secondary | ICD-10-CM | POA: Diagnosis not present

## 2018-11-02 DIAGNOSIS — D509 Iron deficiency anemia, unspecified: Secondary | ICD-10-CM | POA: Diagnosis not present

## 2018-11-02 DIAGNOSIS — N2581 Secondary hyperparathyroidism of renal origin: Secondary | ICD-10-CM | POA: Diagnosis not present

## 2018-11-02 DIAGNOSIS — D631 Anemia in chronic kidney disease: Secondary | ICD-10-CM | POA: Diagnosis not present

## 2018-11-02 DIAGNOSIS — N186 End stage renal disease: Secondary | ICD-10-CM | POA: Diagnosis not present

## 2018-11-02 DIAGNOSIS — Z992 Dependence on renal dialysis: Secondary | ICD-10-CM | POA: Diagnosis not present

## 2018-11-03 DIAGNOSIS — N2581 Secondary hyperparathyroidism of renal origin: Secondary | ICD-10-CM | POA: Diagnosis not present

## 2018-11-03 DIAGNOSIS — N186 End stage renal disease: Secondary | ICD-10-CM | POA: Diagnosis not present

## 2018-11-03 DIAGNOSIS — Z992 Dependence on renal dialysis: Secondary | ICD-10-CM | POA: Diagnosis not present

## 2018-11-03 DIAGNOSIS — D631 Anemia in chronic kidney disease: Secondary | ICD-10-CM | POA: Diagnosis not present

## 2018-11-03 DIAGNOSIS — D509 Iron deficiency anemia, unspecified: Secondary | ICD-10-CM | POA: Diagnosis not present

## 2018-11-04 DIAGNOSIS — D631 Anemia in chronic kidney disease: Secondary | ICD-10-CM | POA: Diagnosis not present

## 2018-11-04 DIAGNOSIS — N186 End stage renal disease: Secondary | ICD-10-CM | POA: Diagnosis not present

## 2018-11-04 DIAGNOSIS — D509 Iron deficiency anemia, unspecified: Secondary | ICD-10-CM | POA: Diagnosis not present

## 2018-11-04 DIAGNOSIS — N2581 Secondary hyperparathyroidism of renal origin: Secondary | ICD-10-CM | POA: Diagnosis not present

## 2018-11-04 DIAGNOSIS — Z992 Dependence on renal dialysis: Secondary | ICD-10-CM | POA: Diagnosis not present

## 2018-11-05 DIAGNOSIS — D509 Iron deficiency anemia, unspecified: Secondary | ICD-10-CM | POA: Diagnosis not present

## 2018-11-05 DIAGNOSIS — N186 End stage renal disease: Secondary | ICD-10-CM | POA: Diagnosis not present

## 2018-11-05 DIAGNOSIS — D631 Anemia in chronic kidney disease: Secondary | ICD-10-CM | POA: Diagnosis not present

## 2018-11-05 DIAGNOSIS — N2581 Secondary hyperparathyroidism of renal origin: Secondary | ICD-10-CM | POA: Diagnosis not present

## 2018-11-05 DIAGNOSIS — Z992 Dependence on renal dialysis: Secondary | ICD-10-CM | POA: Diagnosis not present

## 2018-11-06 DIAGNOSIS — D509 Iron deficiency anemia, unspecified: Secondary | ICD-10-CM | POA: Diagnosis not present

## 2018-11-06 DIAGNOSIS — Z992 Dependence on renal dialysis: Secondary | ICD-10-CM | POA: Diagnosis not present

## 2018-11-06 DIAGNOSIS — D631 Anemia in chronic kidney disease: Secondary | ICD-10-CM | POA: Diagnosis not present

## 2018-11-06 DIAGNOSIS — N186 End stage renal disease: Secondary | ICD-10-CM | POA: Diagnosis not present

## 2018-11-06 DIAGNOSIS — N2581 Secondary hyperparathyroidism of renal origin: Secondary | ICD-10-CM | POA: Diagnosis not present

## 2018-11-07 DIAGNOSIS — N186 End stage renal disease: Secondary | ICD-10-CM | POA: Diagnosis not present

## 2018-11-07 DIAGNOSIS — D631 Anemia in chronic kidney disease: Secondary | ICD-10-CM | POA: Diagnosis not present

## 2018-11-07 DIAGNOSIS — D509 Iron deficiency anemia, unspecified: Secondary | ICD-10-CM | POA: Diagnosis not present

## 2018-11-07 DIAGNOSIS — Z992 Dependence on renal dialysis: Secondary | ICD-10-CM | POA: Diagnosis not present

## 2018-11-07 DIAGNOSIS — N2581 Secondary hyperparathyroidism of renal origin: Secondary | ICD-10-CM | POA: Diagnosis not present

## 2018-11-08 DIAGNOSIS — Z23 Encounter for immunization: Secondary | ICD-10-CM | POA: Diagnosis not present

## 2018-11-08 DIAGNOSIS — R770 Abnormality of albumin: Secondary | ICD-10-CM | POA: Diagnosis not present

## 2018-11-08 DIAGNOSIS — R945 Abnormal results of liver function studies: Secondary | ICD-10-CM | POA: Diagnosis not present

## 2018-11-08 DIAGNOSIS — I10 Essential (primary) hypertension: Secondary | ICD-10-CM | POA: Diagnosis not present

## 2018-11-08 DIAGNOSIS — G894 Chronic pain syndrome: Secondary | ICD-10-CM | POA: Diagnosis not present

## 2018-11-08 DIAGNOSIS — Z992 Dependence on renal dialysis: Secondary | ICD-10-CM | POA: Diagnosis not present

## 2018-11-08 DIAGNOSIS — Z85528 Personal history of other malignant neoplasm of kidney: Secondary | ICD-10-CM | POA: Diagnosis not present

## 2018-11-08 DIAGNOSIS — R944 Abnormal results of kidney function studies: Secondary | ICD-10-CM | POA: Diagnosis not present

## 2018-11-08 DIAGNOSIS — I959 Hypotension, unspecified: Secondary | ICD-10-CM | POA: Diagnosis not present

## 2018-11-08 DIAGNOSIS — N2581 Secondary hyperparathyroidism of renal origin: Secondary | ICD-10-CM | POA: Diagnosis not present

## 2018-11-08 DIAGNOSIS — D509 Iron deficiency anemia, unspecified: Secondary | ICD-10-CM | POA: Diagnosis not present

## 2018-11-08 DIAGNOSIS — D631 Anemia in chronic kidney disease: Secondary | ICD-10-CM | POA: Diagnosis not present

## 2018-11-08 DIAGNOSIS — F411 Generalized anxiety disorder: Secondary | ICD-10-CM | POA: Diagnosis not present

## 2018-11-08 DIAGNOSIS — D638 Anemia in other chronic diseases classified elsewhere: Secondary | ICD-10-CM | POA: Diagnosis not present

## 2018-11-08 DIAGNOSIS — N186 End stage renal disease: Secondary | ICD-10-CM | POA: Diagnosis not present

## 2018-11-09 DIAGNOSIS — D509 Iron deficiency anemia, unspecified: Secondary | ICD-10-CM | POA: Diagnosis not present

## 2018-11-09 DIAGNOSIS — N2581 Secondary hyperparathyroidism of renal origin: Secondary | ICD-10-CM | POA: Diagnosis not present

## 2018-11-09 DIAGNOSIS — Z992 Dependence on renal dialysis: Secondary | ICD-10-CM | POA: Diagnosis not present

## 2018-11-09 DIAGNOSIS — D631 Anemia in chronic kidney disease: Secondary | ICD-10-CM | POA: Diagnosis not present

## 2018-11-09 DIAGNOSIS — N186 End stage renal disease: Secondary | ICD-10-CM | POA: Diagnosis not present

## 2018-11-10 DIAGNOSIS — D631 Anemia in chronic kidney disease: Secondary | ICD-10-CM | POA: Diagnosis not present

## 2018-11-10 DIAGNOSIS — D509 Iron deficiency anemia, unspecified: Secondary | ICD-10-CM | POA: Diagnosis not present

## 2018-11-10 DIAGNOSIS — Z992 Dependence on renal dialysis: Secondary | ICD-10-CM | POA: Diagnosis not present

## 2018-11-10 DIAGNOSIS — N2581 Secondary hyperparathyroidism of renal origin: Secondary | ICD-10-CM | POA: Diagnosis not present

## 2018-11-10 DIAGNOSIS — N186 End stage renal disease: Secondary | ICD-10-CM | POA: Diagnosis not present

## 2018-11-11 DIAGNOSIS — Z992 Dependence on renal dialysis: Secondary | ICD-10-CM | POA: Diagnosis not present

## 2018-11-11 DIAGNOSIS — D509 Iron deficiency anemia, unspecified: Secondary | ICD-10-CM | POA: Diagnosis not present

## 2018-11-11 DIAGNOSIS — D631 Anemia in chronic kidney disease: Secondary | ICD-10-CM | POA: Diagnosis not present

## 2018-11-11 DIAGNOSIS — N2581 Secondary hyperparathyroidism of renal origin: Secondary | ICD-10-CM | POA: Diagnosis not present

## 2018-11-11 DIAGNOSIS — N186 End stage renal disease: Secondary | ICD-10-CM | POA: Diagnosis not present

## 2018-11-12 DIAGNOSIS — N2581 Secondary hyperparathyroidism of renal origin: Secondary | ICD-10-CM | POA: Diagnosis not present

## 2018-11-12 DIAGNOSIS — N186 End stage renal disease: Secondary | ICD-10-CM | POA: Diagnosis not present

## 2018-11-12 DIAGNOSIS — D631 Anemia in chronic kidney disease: Secondary | ICD-10-CM | POA: Diagnosis not present

## 2018-11-12 DIAGNOSIS — Z992 Dependence on renal dialysis: Secondary | ICD-10-CM | POA: Diagnosis not present

## 2018-11-12 DIAGNOSIS — D509 Iron deficiency anemia, unspecified: Secondary | ICD-10-CM | POA: Diagnosis not present

## 2018-11-13 DIAGNOSIS — D509 Iron deficiency anemia, unspecified: Secondary | ICD-10-CM | POA: Diagnosis not present

## 2018-11-13 DIAGNOSIS — N186 End stage renal disease: Secondary | ICD-10-CM | POA: Diagnosis not present

## 2018-11-13 DIAGNOSIS — N2581 Secondary hyperparathyroidism of renal origin: Secondary | ICD-10-CM | POA: Diagnosis not present

## 2018-11-13 DIAGNOSIS — Z992 Dependence on renal dialysis: Secondary | ICD-10-CM | POA: Diagnosis not present

## 2018-11-13 DIAGNOSIS — D631 Anemia in chronic kidney disease: Secondary | ICD-10-CM | POA: Diagnosis not present

## 2018-11-14 DIAGNOSIS — D631 Anemia in chronic kidney disease: Secondary | ICD-10-CM | POA: Diagnosis not present

## 2018-11-14 DIAGNOSIS — N186 End stage renal disease: Secondary | ICD-10-CM | POA: Diagnosis not present

## 2018-11-14 DIAGNOSIS — N2581 Secondary hyperparathyroidism of renal origin: Secondary | ICD-10-CM | POA: Diagnosis not present

## 2018-11-14 DIAGNOSIS — D509 Iron deficiency anemia, unspecified: Secondary | ICD-10-CM | POA: Diagnosis not present

## 2018-11-14 DIAGNOSIS — Z992 Dependence on renal dialysis: Secondary | ICD-10-CM | POA: Diagnosis not present

## 2018-11-15 DIAGNOSIS — Z992 Dependence on renal dialysis: Secondary | ICD-10-CM | POA: Diagnosis not present

## 2018-11-15 DIAGNOSIS — D631 Anemia in chronic kidney disease: Secondary | ICD-10-CM | POA: Diagnosis not present

## 2018-11-15 DIAGNOSIS — N2581 Secondary hyperparathyroidism of renal origin: Secondary | ICD-10-CM | POA: Diagnosis not present

## 2018-11-15 DIAGNOSIS — N186 End stage renal disease: Secondary | ICD-10-CM | POA: Diagnosis not present

## 2018-11-15 DIAGNOSIS — D509 Iron deficiency anemia, unspecified: Secondary | ICD-10-CM | POA: Diagnosis not present

## 2018-11-16 DIAGNOSIS — Z992 Dependence on renal dialysis: Secondary | ICD-10-CM | POA: Diagnosis not present

## 2018-11-16 DIAGNOSIS — N186 End stage renal disease: Secondary | ICD-10-CM | POA: Diagnosis not present

## 2018-11-16 DIAGNOSIS — D509 Iron deficiency anemia, unspecified: Secondary | ICD-10-CM | POA: Diagnosis not present

## 2018-11-16 DIAGNOSIS — N2581 Secondary hyperparathyroidism of renal origin: Secondary | ICD-10-CM | POA: Diagnosis not present

## 2018-11-16 DIAGNOSIS — D631 Anemia in chronic kidney disease: Secondary | ICD-10-CM | POA: Diagnosis not present

## 2018-11-17 DIAGNOSIS — D631 Anemia in chronic kidney disease: Secondary | ICD-10-CM | POA: Diagnosis not present

## 2018-11-17 DIAGNOSIS — N2581 Secondary hyperparathyroidism of renal origin: Secondary | ICD-10-CM | POA: Diagnosis not present

## 2018-11-17 DIAGNOSIS — D509 Iron deficiency anemia, unspecified: Secondary | ICD-10-CM | POA: Diagnosis not present

## 2018-11-17 DIAGNOSIS — N186 End stage renal disease: Secondary | ICD-10-CM | POA: Diagnosis not present

## 2018-11-17 DIAGNOSIS — Z992 Dependence on renal dialysis: Secondary | ICD-10-CM | POA: Diagnosis not present

## 2018-11-18 DIAGNOSIS — Z992 Dependence on renal dialysis: Secondary | ICD-10-CM | POA: Diagnosis not present

## 2018-11-18 DIAGNOSIS — D631 Anemia in chronic kidney disease: Secondary | ICD-10-CM | POA: Diagnosis not present

## 2018-11-18 DIAGNOSIS — N186 End stage renal disease: Secondary | ICD-10-CM | POA: Diagnosis not present

## 2018-11-18 DIAGNOSIS — N2581 Secondary hyperparathyroidism of renal origin: Secondary | ICD-10-CM | POA: Diagnosis not present

## 2018-11-18 DIAGNOSIS — D509 Iron deficiency anemia, unspecified: Secondary | ICD-10-CM | POA: Diagnosis not present

## 2018-11-19 DIAGNOSIS — Z992 Dependence on renal dialysis: Secondary | ICD-10-CM | POA: Diagnosis not present

## 2018-11-19 DIAGNOSIS — N2581 Secondary hyperparathyroidism of renal origin: Secondary | ICD-10-CM | POA: Diagnosis not present

## 2018-11-19 DIAGNOSIS — N186 End stage renal disease: Secondary | ICD-10-CM | POA: Diagnosis not present

## 2018-11-19 DIAGNOSIS — D631 Anemia in chronic kidney disease: Secondary | ICD-10-CM | POA: Diagnosis not present

## 2018-11-19 DIAGNOSIS — D509 Iron deficiency anemia, unspecified: Secondary | ICD-10-CM | POA: Diagnosis not present

## 2018-11-20 DIAGNOSIS — N2581 Secondary hyperparathyroidism of renal origin: Secondary | ICD-10-CM | POA: Diagnosis not present

## 2018-11-20 DIAGNOSIS — D631 Anemia in chronic kidney disease: Secondary | ICD-10-CM | POA: Diagnosis not present

## 2018-11-20 DIAGNOSIS — D509 Iron deficiency anemia, unspecified: Secondary | ICD-10-CM | POA: Diagnosis not present

## 2018-11-20 DIAGNOSIS — Z992 Dependence on renal dialysis: Secondary | ICD-10-CM | POA: Diagnosis not present

## 2018-11-20 DIAGNOSIS — N186 End stage renal disease: Secondary | ICD-10-CM | POA: Diagnosis not present

## 2018-11-21 DIAGNOSIS — D509 Iron deficiency anemia, unspecified: Secondary | ICD-10-CM | POA: Diagnosis not present

## 2018-11-21 DIAGNOSIS — D631 Anemia in chronic kidney disease: Secondary | ICD-10-CM | POA: Diagnosis not present

## 2018-11-21 DIAGNOSIS — N186 End stage renal disease: Secondary | ICD-10-CM | POA: Diagnosis not present

## 2018-11-21 DIAGNOSIS — Z992 Dependence on renal dialysis: Secondary | ICD-10-CM | POA: Diagnosis not present

## 2018-11-21 DIAGNOSIS — N2581 Secondary hyperparathyroidism of renal origin: Secondary | ICD-10-CM | POA: Diagnosis not present

## 2018-11-22 ENCOUNTER — Other Ambulatory Visit: Payer: Self-pay

## 2018-11-22 ENCOUNTER — Inpatient Hospital Stay (HOSPITAL_COMMUNITY): Payer: Medicare Other | Attending: Hematology

## 2018-11-22 DIAGNOSIS — N2581 Secondary hyperparathyroidism of renal origin: Secondary | ICD-10-CM | POA: Diagnosis not present

## 2018-11-22 DIAGNOSIS — D509 Iron deficiency anemia, unspecified: Secondary | ICD-10-CM | POA: Insufficient documentation

## 2018-11-22 DIAGNOSIS — Z992 Dependence on renal dialysis: Secondary | ICD-10-CM | POA: Diagnosis not present

## 2018-11-22 DIAGNOSIS — D631 Anemia in chronic kidney disease: Secondary | ICD-10-CM | POA: Diagnosis not present

## 2018-11-22 DIAGNOSIS — C649 Malignant neoplasm of unspecified kidney, except renal pelvis: Secondary | ICD-10-CM

## 2018-11-22 DIAGNOSIS — N186 End stage renal disease: Secondary | ICD-10-CM | POA: Diagnosis not present

## 2018-11-22 LAB — COMPREHENSIVE METABOLIC PANEL
ALT: 17 U/L (ref 0–44)
AST: 15 U/L (ref 15–41)
Albumin: 3.4 g/dL — ABNORMAL LOW (ref 3.5–5.0)
Alkaline Phosphatase: 88 U/L (ref 38–126)
Anion gap: 17 — ABNORMAL HIGH (ref 5–15)
BUN: 50 mg/dL — ABNORMAL HIGH (ref 8–23)
CO2: 29 mmol/L (ref 22–32)
Calcium: 9 mg/dL (ref 8.9–10.3)
Chloride: 89 mmol/L — ABNORMAL LOW (ref 98–111)
Creatinine, Ser: 18.8 mg/dL — ABNORMAL HIGH (ref 0.61–1.24)
GFR calc Af Amer: 3 mL/min — ABNORMAL LOW (ref >60)
GFR calc non Af Amer: 2 mL/min — ABNORMAL LOW (ref >60)
Glucose, Bld: 125 mg/dL — ABNORMAL HIGH (ref 70–99)
Potassium: 4.2 mmol/L (ref 3.5–5.1)
Sodium: 135 mmol/L (ref 135–145)
Total Bilirubin: 0.7 mg/dL (ref 0.3–1.2)
Total Protein: 6.4 g/dL — ABNORMAL LOW (ref 6.5–8.1)

## 2018-11-22 LAB — CBC WITH DIFFERENTIAL/PLATELET
Abs Immature Granulocytes: 0.04 10*3/uL (ref 0.00–0.07)
Basophils Absolute: 0.1 10*3/uL (ref 0.0–0.1)
Basophils Relative: 1 %
Eosinophils Absolute: 0.4 10*3/uL (ref 0.0–0.5)
Eosinophils Relative: 3 %
HCT: 26.9 % — ABNORMAL LOW (ref 39.0–52.0)
Hemoglobin: 9 g/dL — ABNORMAL LOW (ref 13.0–17.0)
Immature Granulocytes: 0 %
Lymphocytes Relative: 7 %
Lymphs Abs: 0.8 10*3/uL (ref 0.7–4.0)
MCH: 36.7 pg — ABNORMAL HIGH (ref 26.0–34.0)
MCHC: 33.5 g/dL (ref 30.0–36.0)
MCV: 109.8 fL — ABNORMAL HIGH (ref 80.0–100.0)
Monocytes Absolute: 1.1 10*3/uL — ABNORMAL HIGH (ref 0.1–1.0)
Monocytes Relative: 10 %
Neutro Abs: 8.6 10*3/uL — ABNORMAL HIGH (ref 1.7–7.7)
Neutrophils Relative %: 79 %
Platelets: 296 10*3/uL (ref 150–400)
RBC: 2.45 MIL/uL — ABNORMAL LOW (ref 4.22–5.81)
RDW: 15.2 % (ref 11.5–15.5)
WBC: 10.9 10*3/uL — ABNORMAL HIGH (ref 4.0–10.5)
nRBC: 0 % (ref 0.0–0.2)

## 2018-11-23 DIAGNOSIS — N2581 Secondary hyperparathyroidism of renal origin: Secondary | ICD-10-CM | POA: Diagnosis not present

## 2018-11-23 DIAGNOSIS — D509 Iron deficiency anemia, unspecified: Secondary | ICD-10-CM | POA: Diagnosis not present

## 2018-11-23 DIAGNOSIS — D631 Anemia in chronic kidney disease: Secondary | ICD-10-CM | POA: Diagnosis not present

## 2018-11-23 DIAGNOSIS — N186 End stage renal disease: Secondary | ICD-10-CM | POA: Diagnosis not present

## 2018-11-23 DIAGNOSIS — Z992 Dependence on renal dialysis: Secondary | ICD-10-CM | POA: Diagnosis not present

## 2018-11-24 DIAGNOSIS — D509 Iron deficiency anemia, unspecified: Secondary | ICD-10-CM | POA: Diagnosis not present

## 2018-11-24 DIAGNOSIS — N186 End stage renal disease: Secondary | ICD-10-CM | POA: Diagnosis not present

## 2018-11-24 DIAGNOSIS — D631 Anemia in chronic kidney disease: Secondary | ICD-10-CM | POA: Diagnosis not present

## 2018-11-24 DIAGNOSIS — Z992 Dependence on renal dialysis: Secondary | ICD-10-CM | POA: Diagnosis not present

## 2018-11-24 DIAGNOSIS — N2581 Secondary hyperparathyroidism of renal origin: Secondary | ICD-10-CM | POA: Diagnosis not present

## 2018-11-25 DIAGNOSIS — N2581 Secondary hyperparathyroidism of renal origin: Secondary | ICD-10-CM | POA: Diagnosis not present

## 2018-11-25 DIAGNOSIS — N186 End stage renal disease: Secondary | ICD-10-CM | POA: Diagnosis not present

## 2018-11-25 DIAGNOSIS — D631 Anemia in chronic kidney disease: Secondary | ICD-10-CM | POA: Diagnosis not present

## 2018-11-25 DIAGNOSIS — D509 Iron deficiency anemia, unspecified: Secondary | ICD-10-CM | POA: Diagnosis not present

## 2018-11-25 DIAGNOSIS — Z992 Dependence on renal dialysis: Secondary | ICD-10-CM | POA: Diagnosis not present

## 2018-11-26 DIAGNOSIS — N2581 Secondary hyperparathyroidism of renal origin: Secondary | ICD-10-CM | POA: Diagnosis not present

## 2018-11-26 DIAGNOSIS — D631 Anemia in chronic kidney disease: Secondary | ICD-10-CM | POA: Diagnosis not present

## 2018-11-26 DIAGNOSIS — D509 Iron deficiency anemia, unspecified: Secondary | ICD-10-CM | POA: Diagnosis not present

## 2018-11-26 DIAGNOSIS — N186 End stage renal disease: Secondary | ICD-10-CM | POA: Diagnosis not present

## 2018-11-26 DIAGNOSIS — Z992 Dependence on renal dialysis: Secondary | ICD-10-CM | POA: Diagnosis not present

## 2018-11-27 DIAGNOSIS — N2581 Secondary hyperparathyroidism of renal origin: Secondary | ICD-10-CM | POA: Diagnosis not present

## 2018-11-27 DIAGNOSIS — D631 Anemia in chronic kidney disease: Secondary | ICD-10-CM | POA: Diagnosis not present

## 2018-11-27 DIAGNOSIS — Z992 Dependence on renal dialysis: Secondary | ICD-10-CM | POA: Diagnosis not present

## 2018-11-27 DIAGNOSIS — D509 Iron deficiency anemia, unspecified: Secondary | ICD-10-CM | POA: Diagnosis not present

## 2018-11-27 DIAGNOSIS — N186 End stage renal disease: Secondary | ICD-10-CM | POA: Diagnosis not present

## 2018-11-28 DIAGNOSIS — H04123 Dry eye syndrome of bilateral lacrimal glands: Secondary | ICD-10-CM | POA: Diagnosis not present

## 2018-11-28 DIAGNOSIS — N2581 Secondary hyperparathyroidism of renal origin: Secondary | ICD-10-CM | POA: Diagnosis not present

## 2018-11-28 DIAGNOSIS — D631 Anemia in chronic kidney disease: Secondary | ICD-10-CM | POA: Diagnosis not present

## 2018-11-28 DIAGNOSIS — N186 End stage renal disease: Secondary | ICD-10-CM | POA: Diagnosis not present

## 2018-11-28 DIAGNOSIS — Z992 Dependence on renal dialysis: Secondary | ICD-10-CM | POA: Diagnosis not present

## 2018-11-28 DIAGNOSIS — D509 Iron deficiency anemia, unspecified: Secondary | ICD-10-CM | POA: Diagnosis not present

## 2018-11-29 ENCOUNTER — Other Ambulatory Visit: Payer: Self-pay

## 2018-11-29 ENCOUNTER — Inpatient Hospital Stay (HOSPITAL_COMMUNITY): Payer: Medicare Other | Attending: Hematology | Admitting: Hematology

## 2018-11-29 ENCOUNTER — Encounter (HOSPITAL_COMMUNITY): Payer: Self-pay | Admitting: Hematology

## 2018-11-29 ENCOUNTER — Inpatient Hospital Stay (HOSPITAL_COMMUNITY): Payer: Medicare Other

## 2018-11-29 VITALS — BP 149/83 | HR 77 | Temp 97.3°F | Resp 20 | Wt 253.4 lb

## 2018-11-29 DIAGNOSIS — N186 End stage renal disease: Secondary | ICD-10-CM | POA: Insufficient documentation

## 2018-11-29 DIAGNOSIS — Z992 Dependence on renal dialysis: Secondary | ICD-10-CM | POA: Diagnosis not present

## 2018-11-29 DIAGNOSIS — Z9221 Personal history of antineoplastic chemotherapy: Secondary | ICD-10-CM | POA: Insufficient documentation

## 2018-11-29 DIAGNOSIS — Z8551 Personal history of malignant neoplasm of bladder: Secondary | ICD-10-CM | POA: Insufficient documentation

## 2018-11-29 DIAGNOSIS — N2581 Secondary hyperparathyroidism of renal origin: Secondary | ICD-10-CM | POA: Diagnosis not present

## 2018-11-29 DIAGNOSIS — D509 Iron deficiency anemia, unspecified: Secondary | ICD-10-CM | POA: Diagnosis not present

## 2018-11-29 DIAGNOSIS — Z79899 Other long term (current) drug therapy: Secondary | ICD-10-CM | POA: Diagnosis not present

## 2018-11-29 DIAGNOSIS — I10 Essential (primary) hypertension: Secondary | ICD-10-CM | POA: Insufficient documentation

## 2018-11-29 DIAGNOSIS — Z905 Acquired absence of kidney: Secondary | ICD-10-CM | POA: Insufficient documentation

## 2018-11-29 DIAGNOSIS — D539 Nutritional anemia, unspecified: Secondary | ICD-10-CM

## 2018-11-29 DIAGNOSIS — D631 Anemia in chronic kidney disease: Secondary | ICD-10-CM | POA: Diagnosis not present

## 2018-11-29 LAB — RETICULOCYTES
Immature Retic Fract: 24 % — ABNORMAL HIGH (ref 2.3–15.9)
RBC.: 2.59 MIL/uL — ABNORMAL LOW (ref 4.22–5.81)
Retic Count, Absolute: 95.6 10*3/uL (ref 19.0–186.0)
Retic Ct Pct: 3.7 % — ABNORMAL HIGH (ref 0.4–3.1)

## 2018-11-29 LAB — CBC WITH DIFFERENTIAL/PLATELET
Abs Immature Granulocytes: 0.03 10*3/uL (ref 0.00–0.07)
Basophils Absolute: 0 10*3/uL (ref 0.0–0.1)
Basophils Relative: 0 %
Eosinophils Absolute: 0.4 10*3/uL (ref 0.0–0.5)
Eosinophils Relative: 4 %
HCT: 28.6 % — ABNORMAL LOW (ref 39.0–52.0)
Hemoglobin: 9.4 g/dL — ABNORMAL LOW (ref 13.0–17.0)
Immature Granulocytes: 0 %
Lymphocytes Relative: 9 %
Lymphs Abs: 0.8 10*3/uL (ref 0.7–4.0)
MCH: 36.3 pg — ABNORMAL HIGH (ref 26.0–34.0)
MCHC: 32.9 g/dL (ref 30.0–36.0)
MCV: 110.4 fL — ABNORMAL HIGH (ref 80.0–100.0)
Monocytes Absolute: 0.9 10*3/uL (ref 0.1–1.0)
Monocytes Relative: 10 %
Neutro Abs: 6.8 10*3/uL (ref 1.7–7.7)
Neutrophils Relative %: 77 %
Platelets: 317 10*3/uL (ref 150–400)
RBC: 2.59 MIL/uL — ABNORMAL LOW (ref 4.22–5.81)
RDW: 14.4 % (ref 11.5–15.5)
WBC: 8.9 10*3/uL (ref 4.0–10.5)
nRBC: 0 % (ref 0.0–0.2)

## 2018-11-29 LAB — FOLATE: Folate: 8.9 ng/mL (ref >5.9)

## 2018-11-29 LAB — IRON AND TIBC
Iron: 40 ug/dL — ABNORMAL LOW (ref 45–182)
Saturation Ratios: 15 % — ABNORMAL LOW (ref 17.9–39.5)
TIBC: 259 ug/dL (ref 250–450)
UIBC: 219 ug/dL

## 2018-11-29 LAB — LACTATE DEHYDROGENASE: LDH: 169 U/L (ref 98–192)

## 2018-11-29 LAB — VITAMIN B12: Vitamin B-12: 666 pg/mL (ref 180–914)

## 2018-11-29 LAB — FERRITIN: Ferritin: 464 ng/mL — ABNORMAL HIGH (ref 24–336)

## 2018-11-29 LAB — TSH: TSH: 3.828 u[IU]/mL (ref 0.350–4.500)

## 2018-11-29 NOTE — Patient Instructions (Addendum)
[place] at [facility] Discharge Instructions  You were seen today by Dr. Delton Coombes. He went over your history and how you've been feeling lately. He will have blood drawn today. He will see you back in 1 week for labs and follow up.   Thank you for choosing [place] at [facility] to provide your oncology and hematology care.  To afford each patient quality time with our provider, please arrive at least 15 minutes before your scheduled appointment time.   If you have a lab appointment with the [facility] please come in thru the  Main Entrance and check in at the main information desk  You need to re-schedule your appointment should you arrive 10 or more minutes late.  We strive to give you quality time with our providers, and arriving late affects you and other patients whose appointments are after yours.  Also, if you no show three or more times for appointments you may be dismissed from the clinic at the providers discretion.     Again, thank you for choosing [facility].  Our hope is that these requests will decrease the amount of time that you wait before being seen by our physicians.       _____________________________________________________________  Should you have questions after your visit to [facility], please contact our office at [phone] between the hours of 8:00 a.m. and 4:30 p.m.  Voicemails left after 4:00 p.m. will not be returned until the following business day.  For prescription refill requests, have your pharmacy contact our office and allow 72 hours.    Cancer Center Support Programs:   > Cancer Support Group  2nd Tuesday of the month 1pm-2pm, Journey Room

## 2018-11-29 NOTE — Progress Notes (Signed)
[place], [place] [address]   CLINIC:  Medical Oncology/Hematology  PCP:  Patient, No Pcp Per No address on file None   REASON FOR VISIT: Macrocytic anemia and urothelial carcinoma.  CURRENT THERAPY: Intermittent erythropoiesis stimulating agents.   INTERVAL HISTORY:  Jesus [name] 68 y.o. male seen for follow-up of urothelial carcinoma and new problem of macrocytic anemia.  He has been on peritoneal dialysis, 19 hours/day.  He has blood checks first week of every month at [facility] in [place].  He reportedly gets shots to boost up his blood on and off.  He has been feeling very tired because of anemia.  Denied any bleeding per rectum or melena.  He makes no urine as he has bilateral nephrectomies done.  Reportedly he had a colonoscopy in August 2020 which was within normal limits.  I have obtained and reviewed the result which showed adenomatous polyp in sigmoid colon.  He has occasional dizziness.  Appetite is 50%.  Energy levels are 25%.  Occasional constipation reported.    REVIEW OF SYSTEMS:  Review of Systems  Constitutional: Positive for fatigue.  Gastrointestinal: Positive for constipation.  Neurological: Positive for dizziness.  Psychiatric/Behavioral: The patient is nervous/anxious.   All other systems reviewed and are negative.    PAST MEDICAL/SURGICAL HISTORY:  Past Medical History:  Diagnosis Date  . Anemia   . Anxiety   . Arthritis   . Cancer [facility])    bladder, ureter, Bil kidneys  . CHF (congestive heart failure) ([place])   . Current moderate episode of major depressive disorder without prior episode ([place]) 06/28/2017  . Dermatitis    scaly bump occurs every few months, pt uses cortizone cream and it goes away  . ESRD (end stage renal disease) on dialysis ([place])    Bilateral Nephrectomies- due to cancer  . GERD (gastroesophageal reflux disease) 04/07/2015  . History of blood transfusion   . Hypertension   . Lumbar back pain    . Mental disorder   . Non compliance w medication regimen    written in error  . Pleural effusion, right    s/p right thoracentesis 10/07/15 (no malignancy by cytology report)  . Shortness of breath dyspnea    Lying down gets short of breath   Past Surgical History:  Procedure Laterality Date  . AV FISTULA PLACEMENT Right 06/05/2015   Procedure: ARTERIOVENOUS (AV) FISTULA CREATION RIGHT ARM;  Surgeon: Angelia Mould, MD;  Location: [place];  Service: Vascular;  Laterality: Right;  . AV FISTULA PLACEMENT Left 07/25/2017   Procedure: RADIOCEPHALIC  ARTERIOVENOUS FISTULA LEFT ARM;  Surgeon: Conrad La Puente, MD;  Location: [facility] [place];  Service: Vascular;  Laterality: Left;  . BLADDER SURGERY  04-06-2015   removal of bladder/ [facility]   . CAPD INSERTION N/A 06/24/2016   Procedure: LAPAROSCOPIC INSERTION CONTINUOUS AMBULATORY PERITONEAL DIALYSIS  (CAPD) CATHETER;  Surgeon: Clovis Riley, MD;  Location: [place];  Service: General;  Laterality: N/A;  . CARPAL TUNNEL RELEASE     left hand x 2; right hand x 1; right elbow  . CERVICAL FUSION     x 2; 2001 & 2013  . EYE SURGERY Bilateral    Cataract removal  . FISTULA SUPERFICIALIZATION Right 10/30/2015   Procedure: SUPERFICIALIZATION BRACHIOCEPHALIC ARTERIOVENOUS FISTULA Right Arm;  Surgeon: Angelia Mould, MD;  Location: [place];  Service: Vascular;  Laterality: Right;  . Hemocatheter    . IR GENERIC HISTORICAL Right  04/29/2016   IR THROMBECTOMY AV FISTULA W/THROMBOLYSIS/PTA INC/SHUNT/IMG RIGHT 04/29/2016 Arne Cleveland, MD [facility]INTER[facility]  . IR GENERIC HISTORICAL  04/29/2016   IR US GUIDE VASC ACCESS RIGHT 04/29/2016 Arne Cleveland, MD [facility]INTER[facility]  . IR THORACENTESIS ASP PLEURAL SPACE W/IMG GUIDE  12/06/2016  . KNEE ARTHROSCOPY Bilateral    bilateral; 2013 R; L [phone]  . KNEE ARTHROSCOPY WITH MEDIAL MENISECTOMY Left 02/26/2013   Procedure: KNEE ARTHROSCOPY WITH MEDIAL MENISECTOMY;  Surgeon: Garald Balding, MD;  Location:  [place];  Service: Orthopedics;  Laterality: Left;  . LIGATION OF COMPETING BRANCHES OF ARTERIOVENOUS FISTULA Right 10/30/2015   Procedure: LIGATION OF COMPETING BRANCHES OF BRACHIOCEPHALIC ARTERIOVENOUS FISTULA;  Surgeon: Angelia Mould, MD;  Location: [place];  Service: Vascular;  Laterality: Right;  . NEPHRECTOMY Right September 12, 2012  . NEPHRECTOMY Left 04-06-2015   [place] Right 09/07/2015   Procedure: Fistulagram;  Surgeon: Angelia Mould, MD;  Location: [facility];  Service: Cardiovascular;  Laterality: Right;  ARM  . PERIPHERAL VASCULAR CATHETERIZATION Right 09/07/2015   Procedure: Peripheral Vascular Balloon Angioplasty;  Surgeon: Angelia Mould, MD;  Location: [place] [facility];  Service: Cardiovascular;  Laterality: Right;  upper arm venous  . PROSTATECTOMY  04-06-2015   [facility] Bilateral    bilateral; 2005 L  . TOTAL KNEE ARTHROPLASTY  01/24/2012   Procedure: TOTAL KNEE ARTHROPLASTY;  Surgeon: Garald Balding, MD;  Location: [place];  Service: Orthopedics;  Laterality: Right;  RIGHT TOTAL KNEE REPLACEMENT     SOCIAL HISTORY:  Social History   Socioeconomic History  . Marital status: Widowed    Spouse name: Not on file  . Number of children: 1  . Years of education: Not on file  . Highest education level: Some college, no degree  Occupational History  . Occupation: retired  Scientific laboratory technician  . Financial resource strain: Not on file  . Food insecurity    Worry: Not on file    Inability: Not on file  . Transportation needs    Medical: Not on file    Non-medical: Not on file  Tobacco Use  . Smoking status: Never Smoker  . Smokeless tobacco: Never Used  Substance and Sexual Activity  . Alcohol use: Not Currently    Alcohol/week: 0.0 standard drinks    Comment: heavy drinker in the past  . Drug use: Never  . Sexual activity: Not on file   Lifestyle  . Physical activity    Days per week: Not on file    Minutes per session: Not on file  . Stress: Not on file  Relationships  . Social Herbalist on phone: Not on file    Gets together: Not on file    Attends religious service: Not on file    Active member of club or organization: Not on file    Attends meetings of clubs or organizations: Not on file    Relationship status: Not on file  . Intimate partner violence    Fear of current or ex partner: Not on file    Emotionally abused: Not on file    Physically abused: Not on file    Forced sexual activity: Not on file  Other Topics Concern  . Not on file  Social History Narrative   Lives alone. Manages well per patient report. Does not cook much. Eats out a lot. Eats  all food groups. Does not eat much fruit. Married in the past. Has one son. Used to drive a truck.     FAMILY HISTORY:  Family History  Problem Relation Age of Onset  . Cancer Mother   . Hypertension Father   . Atrial fibrillation Father   . Heart disease Father   . Other Father        pacemaker    CURRENT MEDICATIONS:  Outpatient Encounter Medications as of 11/29/2018  Medication Sig Note  . ALPRAZolam (XANAX) 1 MG tablet Take 1 mg by mouth daily as needed for anxiety or sleep.   Marland Kitchen amLODipine (NORVASC) 5 MG tablet Take 1 tablet (5 mg total) by mouth 2 (two) times daily.   Marland Kitchen docusate sodium (COLACE) 250 MG capsule Take 500 mg by mouth daily.   . fluticasone (FLONASE) 50 MCG/ACT nasal spray Place 1 spray into both nostrils daily.   Marland Kitchen lanthanum (FOSRENOL) 500 MG chewable tablet Chew 500 mg by mouth 3 (three) times daily with meals.   Marland Kitchen levothyroxine (SYNTHROID, LEVOTHROID) 75 MCG tablet Take 1 tablet by mouth as directed. 1 hour prior to other medications   . clobetasol cream (TEMOVATE) 3.55 % Apply 1 application topically 2 (two) times daily as needed (skin irritation).    . fluocinonide (LIDEX) 0.05 % external solution APPLY SOLUTION  TOPICALLY TO AFFECTED AREA ONCE DAILY   . mupirocin ointment (BACTROBAN) 2 % Apply 1 application topically as directed. 1-2 times a day to any wounds   . Naproxen Sodium (ALEVE PO) Take by mouth as needed.   Marland Kitchen oxyCODONE-acetaminophen (PERCOCET/ROXICET) 5-325 MG tablet Take 1 tablet by mouth 2 (two) times daily as needed.   [name] Glycol-Propyl Glycol (SYSTANE OP) Place 1 drop into both eyes daily as needed (for dry, itchy eyes). 09/14/2017: Rarely uses  . triamcinolone cream (KENALOG) 0.1 % Apply 1 application topically 2 (two) times daily. As directed. Do not apply to face or skin folds    No facility-administered encounter medications on file as of 11/29/2018.     ALLERGIES:  No Known Allergies   PHYSICAL EXAM:  ECOG Performance status: 1  Vitals:   11/29/18 1050  BP: (!) 149/83  Pulse: 77  Resp: 20  Temp: (!) 97.3 F (36.3 C)  SpO2: 99%   Filed Weights   11/29/18 1050  Weight: 253 lb 6.4 oz (114.9 kg)    Physical Exam Constitutional:      Appearance: He is well-developed.  Cardiovascular:     Rate and Rhythm: Normal rate and regular rhythm.     Heart sounds: Normal heart sounds.  Pulmonary:     Effort: Pulmonary effort is normal.     Breath sounds: Normal breath sounds.  Abdominal:     General: There is no distension.     Palpations: Abdomen is soft. There is no mass.  Musculoskeletal: Normal range of motion.  Lymphadenopathy:     Cervical: No cervical adenopathy.  Skin:    General: Skin is warm and dry.  Neurological:     General: No focal deficit present.     Mental Status: He is alert and oriented to person, place, and time.  Psychiatric:        Behavior: Behavior normal.        Thought Content: Thought content normal.        Judgment: Judgment normal.   Abdomen: Peritoneal catheter present in the left quadrant.  No palpable hepatosplenomegaly or other masses.   LABORATORY DATA:  I have reviewed the labs as listed.  CBC    Component Value  Date/Time   WBC 8.9 11/29/2018 1143   RBC 2.59 (L) 11/29/2018 1143   RBC 2.59 (L) 11/29/2018 1143   HGB 9.4 (L) 11/29/2018 1143   HCT 28.6 (L) 11/29/2018 1143   PLT 317 11/29/2018 1143   MCV 110.4 (H) 11/29/2018 1143   MCH 36.3 (H) 11/29/2018 1143   MCHC 32.9 11/29/2018 1143   RDW 14.4 11/29/2018 1143   LYMPHSABS 0.8 11/29/2018 1143   MONOABS 0.9 11/29/2018 1143   EOSABS 0.4 11/29/2018 1143   BASOSABS 0.0 11/29/2018 1143   CMP Latest Ref Rng & Units 11/22/2018 01/31/2018 08/07/2017  Glucose 70 - 99 mg/dL 125(H) 89 103(H)  BUN 8 - 23 mg/dL 50(H) 64(H) 84(H)  Creatinine 0.61 - 1.24 mg/dL 18.80(H) 18.87(H) 17.42(H)  Sodium 135 - 145 mmol/L 135 133(L) 132(L)  Potassium 3.5 - 5.1 mmol/L 4.2 5.7(H) 5.5(H)  Chloride 98 - 111 mmol/L 89(L) 89(L) 88(L)  CO2 22 - 32 mmol/L 29 24 26   Calcium 8.9 - 10.3 mg/dL 9.0 9.2 9.7  Total Protein 6.5 - 8.1 g/dL 6.4(L) 6.8 6.9  Total Bilirubin 0.3 - 1.2 mg/dL 0.7 0.4 1.0  Alkaline Phos 38 - 126 U/L 88 82 100  AST 15 - 41 U/L 15 10(L) 16  ALT 0 - 44 U/L 17 14 11(L)       DIAGNOSTIC IMAGING:  I have independently reviewed the scans and discussed with the patient.     ASSESSMENT & PLAN:   Macrocytic anemia 1.  Macrocytic anemia: - He had bilateral nephrectomies and is currently on peritoneal dialysis at home.  He follows at [facility] in [place]. - He has been upset that his anemia has not been managed properly.  Apparently he is not receiving shots when his hemoglobin is at 11 and 12, which causes it to drop down to 7 and 8. -He complains of feeling very tired as a result of his anemia. -He reportedly had colonoscopy in August 2020 which showed adenomatous polyp in sigmoid colon.  This was done at [facility]. - Denies any bleeding per rectum or melena.  He gets his blood checked at [facility] in [place] in the first week of each month.  He reportedly got erythropoiesis stimulating agent injection yesterday. - I will check his CBC, and evaluate for nutritional  deficiencies including Y78, folic acid, methylmalonic acid, copper.  We will also check LDH, reticulocyte count, SPEP.  Will check stool for occult blood. -We will see him back in 1 week for follow-up to discuss results.  2.  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  Chemotherapy with cisplatin and gemcitabine, 4 cycles from 824 2015-11 03/2013. 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 -CTAP on 01/31/2018 was negative for metastatic disease. -He is reportedly scheduled for scans later this month at [facility] as part of transplant work-up.      Total time spent is 40 minutes with more than 50% of the time spent face-to-face discussing further work-up, counseling and coordination of care.  Orders placed this encounter:  Orders Placed This Encounter  Procedures  . CBC with Differential/Platelet  . Iron and TIBC  . Ferritin  . Vitamin B12  . Folate  . Methylmalonic acid, serum  . Protein electrophoresis, serum  .  Reticulocytes  . Lactate dehydrogenase  . TSH  . Copper, serum      Derek Jack, MD [place] [phone]

## 2018-11-29 NOTE — Assessment & Plan Note (Signed)
1.  Macrocytic anemia: - He had bilateral nephrectomies and is currently on peritoneal dialysis at home.  He follows at [facility] in [place]. - He has been upset that his anemia has not been managed properly.  Apparently he is not receiving shots when his hemoglobin is at 11 and 12, which causes it to drop down to 7 and 8. -He complains of feeling very tired as a result of his anemia. -He reportedly had colonoscopy in August 2020 which showed adenomatous polyp in sigmoid colon.  This was done at [facility]. - Denies any bleeding per rectum or melena.  He gets his blood checked at [facility] in [place] in the first week of each month.  He reportedly got erythropoiesis stimulating agent injection yesterday. - I will check his CBC, and evaluate for nutritional deficiencies including X32, folic acid, methylmalonic acid, copper.  We will also check LDH, reticulocyte count, SPEP.  Will check stool for occult blood. -We will see him back in 1 week for follow-up to discuss results.  2.  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  Chemotherapy with cisplatin and gemcitabine, 4 cycles from 824 2015-11 03/2013. 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 -CTAP on 01/31/2018 was negative for metastatic disease. -He is reportedly scheduled for scans later this month at [facility] as part of transplant work-up.

## 2018-11-30 ENCOUNTER — Other Ambulatory Visit: Payer: Self-pay

## 2018-11-30 ENCOUNTER — Inpatient Hospital Stay (HOSPITAL_COMMUNITY): Payer: Medicare Other

## 2018-11-30 DIAGNOSIS — N186 End stage renal disease: Secondary | ICD-10-CM | POA: Diagnosis not present

## 2018-11-30 DIAGNOSIS — Z9221 Personal history of antineoplastic chemotherapy: Secondary | ICD-10-CM | POA: Diagnosis not present

## 2018-11-30 DIAGNOSIS — N2581 Secondary hyperparathyroidism of renal origin: Secondary | ICD-10-CM | POA: Diagnosis not present

## 2018-11-30 DIAGNOSIS — D631 Anemia in chronic kidney disease: Secondary | ICD-10-CM | POA: Diagnosis not present

## 2018-11-30 DIAGNOSIS — I10 Essential (primary) hypertension: Secondary | ICD-10-CM | POA: Diagnosis not present

## 2018-11-30 DIAGNOSIS — Z8551 Personal history of malignant neoplasm of bladder: Secondary | ICD-10-CM | POA: Diagnosis not present

## 2018-11-30 DIAGNOSIS — D509 Iron deficiency anemia, unspecified: Secondary | ICD-10-CM | POA: Diagnosis not present

## 2018-11-30 DIAGNOSIS — D539 Nutritional anemia, unspecified: Secondary | ICD-10-CM

## 2018-11-30 DIAGNOSIS — Z905 Acquired absence of kidney: Secondary | ICD-10-CM | POA: Diagnosis not present

## 2018-11-30 DIAGNOSIS — Z992 Dependence on renal dialysis: Secondary | ICD-10-CM | POA: Diagnosis not present

## 2018-12-01 DIAGNOSIS — N186 End stage renal disease: Secondary | ICD-10-CM | POA: Diagnosis not present

## 2018-12-01 DIAGNOSIS — D631 Anemia in chronic kidney disease: Secondary | ICD-10-CM | POA: Diagnosis not present

## 2018-12-01 DIAGNOSIS — Z992 Dependence on renal dialysis: Secondary | ICD-10-CM | POA: Diagnosis not present

## 2018-12-01 DIAGNOSIS — D509 Iron deficiency anemia, unspecified: Secondary | ICD-10-CM | POA: Diagnosis not present

## 2018-12-01 DIAGNOSIS — N2581 Secondary hyperparathyroidism of renal origin: Secondary | ICD-10-CM | POA: Diagnosis not present

## 2018-12-02 DIAGNOSIS — N2581 Secondary hyperparathyroidism of renal origin: Secondary | ICD-10-CM | POA: Diagnosis not present

## 2018-12-02 DIAGNOSIS — Z992 Dependence on renal dialysis: Secondary | ICD-10-CM | POA: Diagnosis not present

## 2018-12-02 DIAGNOSIS — D509 Iron deficiency anemia, unspecified: Secondary | ICD-10-CM | POA: Diagnosis not present

## 2018-12-02 DIAGNOSIS — D631 Anemia in chronic kidney disease: Secondary | ICD-10-CM | POA: Diagnosis not present

## 2018-12-02 DIAGNOSIS — N186 End stage renal disease: Secondary | ICD-10-CM | POA: Diagnosis not present

## 2018-12-02 LAB — PROTEIN ELECTROPHORESIS, SERUM
A/G Ratio: 1.7 (ref 0.7–1.7)
Albumin ELP: 3.8 g/dL (ref 2.9–4.4)
Alpha-1-Globulin: 0.3 g/dL (ref 0.0–0.4)
Alpha-2-Globulin: 0.7 g/dL (ref 0.4–1.0)
Beta Globulin: 0.8 g/dL (ref 0.7–1.3)
Gamma Globulin: 0.6 g/dL (ref 0.4–1.8)
Globulin, Total: 2.3 g/dL (ref 2.2–3.9)
Total Protein ELP: 6.1 g/dL (ref 6.0–8.5)

## 2018-12-03 DIAGNOSIS — Z992 Dependence on renal dialysis: Secondary | ICD-10-CM | POA: Diagnosis not present

## 2018-12-03 DIAGNOSIS — D509 Iron deficiency anemia, unspecified: Secondary | ICD-10-CM | POA: Diagnosis not present

## 2018-12-03 DIAGNOSIS — N186 End stage renal disease: Secondary | ICD-10-CM | POA: Diagnosis not present

## 2018-12-03 DIAGNOSIS — D631 Anemia in chronic kidney disease: Secondary | ICD-10-CM | POA: Diagnosis not present

## 2018-12-03 DIAGNOSIS — N2581 Secondary hyperparathyroidism of renal origin: Secondary | ICD-10-CM | POA: Diagnosis not present

## 2018-12-04 DIAGNOSIS — D509 Iron deficiency anemia, unspecified: Secondary | ICD-10-CM | POA: Diagnosis not present

## 2018-12-04 DIAGNOSIS — N186 End stage renal disease: Secondary | ICD-10-CM | POA: Diagnosis not present

## 2018-12-04 DIAGNOSIS — N2581 Secondary hyperparathyroidism of renal origin: Secondary | ICD-10-CM | POA: Diagnosis not present

## 2018-12-04 DIAGNOSIS — Z992 Dependence on renal dialysis: Secondary | ICD-10-CM | POA: Diagnosis not present

## 2018-12-04 DIAGNOSIS — D631 Anemia in chronic kidney disease: Secondary | ICD-10-CM | POA: Diagnosis not present

## 2018-12-04 LAB — COPPER, SERUM: Copper: 110 ug/dL (ref 72–166)

## 2018-12-04 LAB — METHYLMALONIC ACID, SERUM: Methylmalonic Acid, Quantitative: 742 nmol/L — ABNORMAL HIGH (ref 0–378)

## 2018-12-05 ENCOUNTER — Other Ambulatory Visit: Payer: Self-pay

## 2018-12-05 ENCOUNTER — Encounter (HOSPITAL_COMMUNITY): Payer: Self-pay | Admitting: Hematology

## 2018-12-05 ENCOUNTER — Inpatient Hospital Stay (HOSPITAL_BASED_OUTPATIENT_CLINIC_OR_DEPARTMENT_OTHER): Payer: Medicare Other | Admitting: Hematology

## 2018-12-05 VITALS — BP 162/75 | HR 67 | Temp 97.3°F | Resp 18 | Wt 247.7 lb

## 2018-12-05 DIAGNOSIS — D539 Nutritional anemia, unspecified: Secondary | ICD-10-CM | POA: Diagnosis not present

## 2018-12-05 DIAGNOSIS — Z905 Acquired absence of kidney: Secondary | ICD-10-CM | POA: Diagnosis not present

## 2018-12-05 DIAGNOSIS — Z9221 Personal history of antineoplastic chemotherapy: Secondary | ICD-10-CM | POA: Diagnosis not present

## 2018-12-05 DIAGNOSIS — D509 Iron deficiency anemia, unspecified: Secondary | ICD-10-CM | POA: Diagnosis not present

## 2018-12-05 DIAGNOSIS — D631 Anemia in chronic kidney disease: Secondary | ICD-10-CM | POA: Diagnosis not present

## 2018-12-05 DIAGNOSIS — N2581 Secondary hyperparathyroidism of renal origin: Secondary | ICD-10-CM | POA: Diagnosis not present

## 2018-12-05 DIAGNOSIS — Z992 Dependence on renal dialysis: Secondary | ICD-10-CM | POA: Diagnosis not present

## 2018-12-05 DIAGNOSIS — D500 Iron deficiency anemia secondary to blood loss (chronic): Secondary | ICD-10-CM | POA: Diagnosis not present

## 2018-12-05 DIAGNOSIS — D649 Anemia, unspecified: Secondary | ICD-10-CM

## 2018-12-05 DIAGNOSIS — N186 End stage renal disease: Secondary | ICD-10-CM | POA: Diagnosis not present

## 2018-12-05 DIAGNOSIS — I10 Essential (primary) hypertension: Secondary | ICD-10-CM | POA: Diagnosis not present

## 2018-12-05 DIAGNOSIS — Z8551 Personal history of malignant neoplasm of bladder: Secondary | ICD-10-CM | POA: Diagnosis not present

## 2018-12-05 LAB — OCCULT BLOOD X 1 CARD TO LAB, STOOL
Fecal Occult Bld: NEGATIVE
Fecal Occult Bld: NEGATIVE
Fecal Occult Bld: POSITIVE — AB

## 2018-12-05 MED ORDER — CYANOCOBALAMIN 1000 MCG/ML IJ SOLN
1000.0000 ug | Freq: Once | INTRAMUSCULAR | Status: AC
  Administered 2018-12-05: 1000 ug via INTRAMUSCULAR
  Filled 2018-12-05: qty 1

## 2018-12-05 NOTE — Assessment & Plan Note (Signed)
1.  Macrocytic anemia: - He had bilateral nephrectomies and is currently on peritoneal dialysis at home.  He follows at [facility] in [place]. - He has been upset that his anemia has not been managed properly.  Apparently he is not receiving shots when his hemoglobin is at 11 and 12, which causes it to drop down to 7 and 8. -He complains of feeling very tired as a result of his anemia. -He reportedly had colonoscopy in August 2020 which showed adenomatous polyp in sigmoid colon.  This was done at [facility]. - We checked his stool, 1 of 3 cards was positive for occult blood. - His methylmalonic acid was elevated at 742.  Will give B12 injection today and have told him to take B12 1 mg daily. - Hemoglobin is 9.4.  Ferritin was 464 and percent saturation is 15.  He will benefit from parenteral iron therapy.  He will receive iron once a month at dialysis clinic. -He reports that he has been receiving Epogen once a week for the last 6 weeks.  I have obtained records from dialysis clinic. -He would like to continue Epogen at dialysis clinic at this time.  We will reevaluate him in 2 months. -He would like to switch his anemia management to our clinic if the dialysis clinic lets his blood drop down to 7-8.  2.  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  Chemotherapy with cisplatin and gemcitabine, 4 cycles from 824 2015-11 03/2013. 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 -CTAP on 01/31/2018 was negative for metastatic disease. -He is reportedly scheduled for scans later this month at [facility] as part of transplant work-up.

## 2018-12-05 NOTE — Patient Instructions (Addendum)
[place] at [facility] Discharge Instructions  You were seen today by Dr. Delton Coombes. He went over your recent lab results. He would like you to get a B12 injection today and then start taking a B12 1 mg tablet daily. He will see you back in 2 months for labs and follow up.   Thank you for choosing [place] at [facility] to provide your oncology and hematology care.  To afford each patient quality time with our provider, please arrive at least 15 minutes before your scheduled appointment time.   If you have a lab appointment with the [facility] please come in thru the  Main Entrance and check in at the main information desk  You need to re-schedule your appointment should you arrive 10 or more minutes late.  We strive to give you quality time with our providers, and arriving late affects you and other patients whose appointments are after yours.  Also, if you no show three or more times for appointments you may be dismissed from the clinic at the providers discretion.     Again, thank you for choosing [facility].  Our hope is that these requests will decrease the amount of time that you wait before being seen by our physicians.       _____________________________________________________________  Should you have questions after your visit to [facility], please contact our office at [phone] between the hours of 8:00 a.m. and 4:30 p.m.  Voicemails left after 4:00 p.m. will not be returned until the following business day.  For prescription refill requests, have your pharmacy contact our office and allow 72 hours.    Cancer Center Support Programs:   > Cancer Support Group  2nd Tuesday of the month 1pm-2pm, Journey Room

## 2018-12-05 NOTE — Progress Notes (Signed)
[place], [place] [address]   CLINIC:  Medical Oncology/Hematology  PCP:  Patient, No Pcp Per No address on file None   REASON FOR VISIT: Macrocytic anemia and urothelial carcinoma.  CURRENT THERAPY: Intermittent erythropoiesis stimulating agents.   INTERVAL HISTORY:  Jesus [name] 68 y.o. male seen for follow-up of anemia.  He brought back stool cards.  He also done some blood work at last visit.  He reports that he has been receiving Epogen weekly for the last 6 weeks at dialysis clinic in [place].  He is upset at them as they stopped his Epogen when it gets down to 12 and were not restarted until it drops down to 7 or 8.  Appetite is 50%.  Energy levels are 25%.  Denies any tingling or numbness in extremities.  No fevers or night sweats reported.    REVIEW OF SYSTEMS:  Review of Systems  All other systems reviewed and are negative.    PAST MEDICAL/SURGICAL HISTORY:  Past Medical History:  Diagnosis Date  . Anemia   . Anxiety   . Arthritis   . Cancer [facility])    bladder, ureter, Bil kidneys  . CHF (congestive heart failure) ([place])   . Current moderate episode of major depressive disorder without prior episode ([place]) 06/28/2017  . Dermatitis    scaly bump occurs every few months, pt uses cortizone cream and it goes away  . ESRD (end stage renal disease) on dialysis ([place])    Bilateral Nephrectomies- due to cancer  . GERD (gastroesophageal reflux disease) 04/07/2015  . History of blood transfusion   . Hypertension   . Lumbar back pain   . Mental disorder   . Non compliance w medication regimen    written in error  . Pleural effusion, right    s/p right thoracentesis 10/07/15 (no malignancy by cytology report)  . Shortness of breath dyspnea    Lying down gets short of breath   Past Surgical History:  Procedure Laterality Date  . AV FISTULA PLACEMENT Right 06/05/2015   Procedure: ARTERIOVENOUS (AV) FISTULA CREATION RIGHT ARM;  Surgeon:  Angelia Mould, MD;  Location: [place];  Service: Vascular;  Laterality: Right;  . AV FISTULA PLACEMENT Left 07/25/2017   Procedure: RADIOCEPHALIC  ARTERIOVENOUS FISTULA LEFT ARM;  Surgeon: Conrad [place], MD;  Location: [place];  Service: Vascular;  Laterality: Left;  . BLADDER SURGERY  04-06-2015   removal of bladder/ [facility]   . CAPD INSERTION N/A 06/24/2016   Procedure: LAPAROSCOPIC INSERTION CONTINUOUS AMBULATORY PERITONEAL DIALYSIS  (CAPD) CATHETER;  Surgeon: Clovis Riley, MD;  Location: [place];  Service: General;  Laterality: N/A;  . CARPAL TUNNEL RELEASE     left hand x 2; right hand x 1; right elbow  . CERVICAL FUSION     x 2; 2001 & 2013  . EYE SURGERY Bilateral    Cataract removal  . FISTULA SUPERFICIALIZATION Right 10/30/2015   Procedure: SUPERFICIALIZATION BRACHIOCEPHALIC ARTERIOVENOUS FISTULA Right Arm;  Surgeon: Angelia Mould, MD;  Location: [place];  Service: Vascular;  Laterality: Right;  . Hemocatheter    . IR GENERIC HISTORICAL Right 04/29/2016   IR THROMBECTOMY AV FISTULA W/THROMBOLYSIS/PTA INC/SHUNT/IMG RIGHT 04/29/2016 Arne Cleveland, MD [facility]  . IR GENERIC HISTORICAL  04/29/2016   IR US GUIDE VASC ACCESS RIGHT 04/29/2016 Arne Cleveland, MD [facility]  . IR THORACENTESIS ASP PLEURAL SPACE W/IMG GUIDE  12/06/2016  . KNEE ARTHROSCOPY Bilateral  bilateral; 2013 R; L [phone]  . KNEE ARTHROSCOPY WITH MEDIAL MENISECTOMY Left 02/26/2013   Procedure: KNEE ARTHROSCOPY WITH MEDIAL MENISECTOMY;  Surgeon: Garald Balding, MD;  Location: [place];  Service: Orthopedics;  Laterality: Left;  . LIGATION OF COMPETING BRANCHES OF ARTERIOVENOUS FISTULA Right 10/30/2015   Procedure: LIGATION OF COMPETING BRANCHES OF BRACHIOCEPHALIC ARTERIOVENOUS FISTULA;  Surgeon: Angelia Mould, MD;  Location: [place];  Service: Vascular;  Laterality: Right;  . NEPHRECTOMY Right September 12, 2012  . NEPHRECTOMY Left 04-06-2015   [place] Right 09/07/2015   Procedure: Fistulagram;  Surgeon: Angelia Mould, MD;  Location: [place] [facility];  Service: Cardiovascular;  Laterality: Right;  ARM  . PERIPHERAL VASCULAR CATHETERIZATION Right 09/07/2015   Procedure: Peripheral Vascular Balloon Angioplasty;  Surgeon: Angelia Mould, MD;  Location: [place] [facility];  Service: Cardiovascular;  Laterality: Right;  upper arm venous  . PROSTATECTOMY  04-06-2015   [place] Bilateral    bilateral; 2005 L  . TOTAL KNEE ARTHROPLASTY  01/24/2012   Procedure: TOTAL KNEE ARTHROPLASTY;  Surgeon: Garald Balding, MD;  Location: [place];  Service: Orthopedics;  Laterality: Right;  RIGHT TOTAL KNEE REPLACEMENT     SOCIAL HISTORY:  Social History   Socioeconomic History  . Marital status: Widowed    Spouse name: Not on file  . Number of children: 1  . Years of education: Not on file  . Highest education level: Some college, no degree  Occupational History  . Occupation: retired  Scientific laboratory technician  . Financial resource strain: Not on file  . Food insecurity    Worry: Not on file    Inability: Not on file  . Transportation needs    Medical: Not on file    Non-medical: Not on file  Tobacco Use  . Smoking status: Never Smoker  . Smokeless tobacco: Never Used  Substance and Sexual Activity  . Alcohol use: Not Currently    Alcohol/week: 0.0 standard drinks    Comment: heavy drinker in the past  . Drug use: Never  . Sexual activity: Not on file  Lifestyle  . Physical activity    Days per week: Not on file    Minutes per session: Not on file  . Stress: Not on file  Relationships  . Social Herbalist on phone: Not on file    Gets together: Not on file    Attends religious service: Not on file    Active member of club or organization: Not on file    Attends meetings of clubs or organizations: Not on file    Relationship status: Not on  file  . Intimate partner violence    Fear of current or ex partner: Not on file    Emotionally abused: Not on file    Physically abused: Not on file    Forced sexual activity: Not on file  Other Topics Concern  . Not on file  Social History Narrative   Lives alone. Manages well per patient report. Does not cook much. Eats out a lot. Eats all food groups. Does not eat much fruit. Married in the past. Has one son. Used to drive a truck.     FAMILY HISTORY:  Family History  Problem Relation Age of Onset  . Cancer Mother   . Hypertension Father   . Atrial fibrillation Father   . Heart disease Father   .  Other Father        pacemaker    CURRENT MEDICATIONS:  Outpatient Encounter Medications as of 12/05/2018  Medication Sig Note  . ALPRAZolam (XANAX) 1 MG tablet Take 1 mg by mouth daily as needed for anxiety or sleep.   Marland Kitchen amLODipine (NORVASC) 5 MG tablet Take 1 tablet (5 mg total) by mouth 2 (two) times daily.   Marland Kitchen docusate sodium (COLACE) 250 MG capsule Take 500 mg by mouth daily.   . fluticasone (FLONASE) 50 MCG/ACT nasal spray Place 1 spray into both nostrils daily.   Marland Kitchen lanthanum (FOSRENOL) 500 MG chewable tablet Chew 500 mg by mouth 3 (three) times daily with meals.   Marland Kitchen levothyroxine (SYNTHROID, LEVOTHROID) 75 MCG tablet Take 1 tablet by mouth as directed. 1 hour prior to other medications   . clobetasol cream (TEMOVATE) 1.66 % Apply 1 application topically 2 (two) times daily as needed (skin irritation).    . fluocinonide (LIDEX) 0.05 % external solution APPLY SOLUTION TOPICALLY TO AFFECTED AREA ONCE DAILY   . mupirocin ointment (BACTROBAN) 2 % Apply 1 application topically as directed. 1-2 times a day to any wounds   . Naproxen Sodium (ALEVE PO) Take by mouth as needed.   Marland Kitchen oxyCODONE-acetaminophen (PERCOCET/ROXICET) 5-325 MG tablet Take 1 tablet by mouth 2 (two) times daily as needed.   [name] Glycol-Propyl Glycol (SYSTANE OP) Place 1 drop into both eyes daily as needed (for  dry, itchy eyes). 09/14/2017: Rarely uses  . triamcinolone cream (KENALOG) 0.1 % Apply 1 application topically 2 (two) times daily. As directed. Do not apply to face or skin folds   . [EXPIRED] cyanocobalamin ((VITAMIN B-12)) injection 1,000 mcg     No facility-administered encounter medications on file as of 12/05/2018.     ALLERGIES:  No Known Allergies   PHYSICAL EXAM:  ECOG Performance status: 1  Vitals:   12/05/18 1201  BP: (!) 162/75  Pulse: 67  Resp: 18  Temp: (!) 97.3 F (36.3 C)  SpO2: 96%   Filed Weights   12/05/18 1201  Weight: 247 lb 11.2 oz (112.4 kg)    Physical Exam Constitutional:      Appearance: He is well-developed.  Cardiovascular:     Rate and Rhythm: Normal rate and regular rhythm.     Heart sounds: Normal heart sounds.  Pulmonary:     Effort: Pulmonary effort is normal.     Breath sounds: Normal breath sounds.  Abdominal:     General: There is no distension.     Palpations: Abdomen is soft. There is no mass.  Musculoskeletal: Normal range of motion.  Lymphadenopathy:     Cervical: No cervical adenopathy.  Skin:    General: Skin is warm and dry.  Neurological:     General: No focal deficit present.     Mental Status: He is alert and oriented to person, place, and time.  Psychiatric:        Behavior: Behavior normal.        Thought Content: Thought content normal.        Judgment: Judgment normal.   Abdomen: Peritoneal catheter present in the left quadrant.  No palpable hepatosplenomegaly or other masses.   LABORATORY DATA:  I have reviewed the labs as listed.  CBC    Component Value Date/Time   WBC 8.9 11/29/2018 1143   RBC 2.59 (L) 11/29/2018 1143   RBC 2.59 (L) 11/29/2018 1143   HGB 9.4 (L) 11/29/2018 1143   HCT 28.6 (L) 11/29/2018  1143   PLT 317 11/29/2018 1143   MCV 110.4 (H) 11/29/2018 1143   MCH 36.3 (H) 11/29/2018 1143   MCHC 32.9 11/29/2018 1143   RDW 14.4 11/29/2018 1143   LYMPHSABS 0.8 11/29/2018 1143   MONOABS 0.9  11/29/2018 1143   EOSABS 0.4 11/29/2018 1143   BASOSABS 0.0 11/29/2018 1143   CMP Latest Ref Rng & Units 11/22/2018 01/31/2018 08/07/2017  Glucose 70 - 99 mg/dL 125(H) 89 103(H)  BUN 8 - 23 mg/dL 50(H) 64(H) 84(H)  Creatinine 0.61 - 1.24 mg/dL 18.80(H) 18.87(H) 17.42(H)  Sodium 135 - 145 mmol/L 135 133(L) 132(L)  Potassium 3.5 - 5.1 mmol/L 4.2 5.7(H) 5.5(H)  Chloride 98 - 111 mmol/L 89(L) 89(L) 88(L)  CO2 22 - 32 mmol/L 29 24 26   Calcium 8.9 - 10.3 mg/dL 9.0 9.2 9.7  Total Protein 6.5 - 8.1 g/dL 6.4(L) 6.8 6.9  Total Bilirubin 0.3 - 1.2 mg/dL 0.7 0.4 1.0  Alkaline Phos 38 - 126 U/L 88 82 100  AST 15 - 41 U/L 15 10(L) 16  ALT 0 - 44 U/L 17 14 11(L)       DIAGNOSTIC IMAGING:  I have independently reviewed the scans and discussed with the patient.     ASSESSMENT & PLAN:   Macrocytic anemia 1.  Macrocytic anemia: - He had bilateral nephrectomies and is currently on peritoneal dialysis at home.  He follows at [facility] in [place]. - He has been upset that his anemia has not been managed properly.  Apparently he is not receiving shots when his hemoglobin is at 11 and 12, which causes it to drop down to 7 and 8. -He complains of feeling very tired as a result of his anemia. -He reportedly had colonoscopy in August 2020 which showed adenomatous polyp in sigmoid colon.  This was done at [facility]. - We checked his stool, 1 of 3 cards was positive for occult blood. - His methylmalonic acid was elevated at 742.  Will give B12 injection today and have told him to take B12 1 mg daily. - Hemoglobin is 9.4.  Ferritin was 464 and percent saturation is 15.  He will benefit from parenteral iron therapy.  He will receive iron once a month at dialysis clinic. -He reports that he has been receiving Epogen once a week for the last 6 weeks.  I have obtained records from dialysis clinic. -He would like to continue Epogen at dialysis clinic at this time.  We will reevaluate him in 2 months. -He would like to  switch his anemia management to our clinic if the dialysis clinic lets his blood drop down to 7-8.  2.  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  Chemotherapy with cisplatin and gemcitabine, 4 cycles from 824 2015-11 03/2013. 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 -CTAP on 01/31/2018 was negative for metastatic disease. -He is reportedly scheduled for scans later this month at [facility] as part of transplant work-up.     Total time spent is 25 minutes with more than 50% of the time spent face-to-face discussing lab results, treatment plan, counseling and coordination of care.  Orders placed this encounter:  Orders Placed This Encounter  Procedures  . Occult blood x 1 card to lab, stool  . Occult blood x 1 card to lab, stool  . Occult  blood x 1 card to lab, stool  . CBC with Differential/Platelet  . Iron and TIBC  . Ferritin  . Vitamin B12  . Methylmalonic acid, serum      Derek Jack, MD [place] [phone]

## 2018-12-06 DIAGNOSIS — Z992 Dependence on renal dialysis: Secondary | ICD-10-CM | POA: Diagnosis not present

## 2018-12-06 DIAGNOSIS — N186 End stage renal disease: Secondary | ICD-10-CM | POA: Diagnosis not present

## 2018-12-06 DIAGNOSIS — D631 Anemia in chronic kidney disease: Secondary | ICD-10-CM | POA: Diagnosis not present

## 2018-12-06 DIAGNOSIS — D509 Iron deficiency anemia, unspecified: Secondary | ICD-10-CM | POA: Diagnosis not present

## 2018-12-06 DIAGNOSIS — N2581 Secondary hyperparathyroidism of renal origin: Secondary | ICD-10-CM | POA: Diagnosis not present

## 2018-12-07 DIAGNOSIS — N2581 Secondary hyperparathyroidism of renal origin: Secondary | ICD-10-CM | POA: Diagnosis not present

## 2018-12-07 DIAGNOSIS — Z992 Dependence on renal dialysis: Secondary | ICD-10-CM | POA: Diagnosis not present

## 2018-12-07 DIAGNOSIS — D509 Iron deficiency anemia, unspecified: Secondary | ICD-10-CM | POA: Diagnosis not present

## 2018-12-07 DIAGNOSIS — D631 Anemia in chronic kidney disease: Secondary | ICD-10-CM | POA: Diagnosis not present

## 2018-12-07 DIAGNOSIS — N186 End stage renal disease: Secondary | ICD-10-CM | POA: Diagnosis not present

## 2018-12-08 DIAGNOSIS — D631 Anemia in chronic kidney disease: Secondary | ICD-10-CM | POA: Diagnosis not present

## 2018-12-08 DIAGNOSIS — D509 Iron deficiency anemia, unspecified: Secondary | ICD-10-CM | POA: Diagnosis not present

## 2018-12-08 DIAGNOSIS — N186 End stage renal disease: Secondary | ICD-10-CM | POA: Diagnosis not present

## 2018-12-08 DIAGNOSIS — Z992 Dependence on renal dialysis: Secondary | ICD-10-CM | POA: Diagnosis not present

## 2018-12-08 DIAGNOSIS — N2581 Secondary hyperparathyroidism of renal origin: Secondary | ICD-10-CM | POA: Diagnosis not present

## 2018-12-09 DIAGNOSIS — D509 Iron deficiency anemia, unspecified: Secondary | ICD-10-CM | POA: Diagnosis not present

## 2018-12-09 DIAGNOSIS — N2581 Secondary hyperparathyroidism of renal origin: Secondary | ICD-10-CM | POA: Diagnosis not present

## 2018-12-09 DIAGNOSIS — Z992 Dependence on renal dialysis: Secondary | ICD-10-CM | POA: Diagnosis not present

## 2018-12-09 DIAGNOSIS — N186 End stage renal disease: Secondary | ICD-10-CM | POA: Diagnosis not present

## 2018-12-09 DIAGNOSIS — D631 Anemia in chronic kidney disease: Secondary | ICD-10-CM | POA: Diagnosis not present

## 2018-12-10 DIAGNOSIS — N2581 Secondary hyperparathyroidism of renal origin: Secondary | ICD-10-CM | POA: Diagnosis not present

## 2018-12-10 DIAGNOSIS — Z992 Dependence on renal dialysis: Secondary | ICD-10-CM | POA: Diagnosis not present

## 2018-12-10 DIAGNOSIS — N186 End stage renal disease: Secondary | ICD-10-CM | POA: Diagnosis not present

## 2018-12-10 DIAGNOSIS — D509 Iron deficiency anemia, unspecified: Secondary | ICD-10-CM | POA: Diagnosis not present

## 2018-12-10 DIAGNOSIS — D631 Anemia in chronic kidney disease: Secondary | ICD-10-CM | POA: Diagnosis not present

## 2018-12-11 DIAGNOSIS — Z992 Dependence on renal dialysis: Secondary | ICD-10-CM | POA: Diagnosis not present

## 2018-12-11 DIAGNOSIS — D631 Anemia in chronic kidney disease: Secondary | ICD-10-CM | POA: Diagnosis not present

## 2018-12-11 DIAGNOSIS — N2581 Secondary hyperparathyroidism of renal origin: Secondary | ICD-10-CM | POA: Diagnosis not present

## 2018-12-11 DIAGNOSIS — N186 End stage renal disease: Secondary | ICD-10-CM | POA: Diagnosis not present

## 2018-12-11 DIAGNOSIS — D509 Iron deficiency anemia, unspecified: Secondary | ICD-10-CM | POA: Diagnosis not present

## 2018-12-12 DIAGNOSIS — D509 Iron deficiency anemia, unspecified: Secondary | ICD-10-CM | POA: Diagnosis not present

## 2018-12-12 DIAGNOSIS — N186 End stage renal disease: Secondary | ICD-10-CM | POA: Diagnosis not present

## 2018-12-12 DIAGNOSIS — N2581 Secondary hyperparathyroidism of renal origin: Secondary | ICD-10-CM | POA: Diagnosis not present

## 2018-12-12 DIAGNOSIS — Z992 Dependence on renal dialysis: Secondary | ICD-10-CM | POA: Diagnosis not present

## 2018-12-12 DIAGNOSIS — D631 Anemia in chronic kidney disease: Secondary | ICD-10-CM | POA: Diagnosis not present

## 2018-12-13 DIAGNOSIS — N2581 Secondary hyperparathyroidism of renal origin: Secondary | ICD-10-CM | POA: Diagnosis not present

## 2018-12-13 DIAGNOSIS — D509 Iron deficiency anemia, unspecified: Secondary | ICD-10-CM | POA: Diagnosis not present

## 2018-12-13 DIAGNOSIS — Z992 Dependence on renal dialysis: Secondary | ICD-10-CM | POA: Diagnosis not present

## 2018-12-13 DIAGNOSIS — D631 Anemia in chronic kidney disease: Secondary | ICD-10-CM | POA: Diagnosis not present

## 2018-12-13 DIAGNOSIS — N186 End stage renal disease: Secondary | ICD-10-CM | POA: Diagnosis not present

## 2018-12-14 DIAGNOSIS — Z992 Dependence on renal dialysis: Secondary | ICD-10-CM | POA: Diagnosis not present

## 2018-12-14 DIAGNOSIS — D509 Iron deficiency anemia, unspecified: Secondary | ICD-10-CM | POA: Diagnosis not present

## 2018-12-14 DIAGNOSIS — D631 Anemia in chronic kidney disease: Secondary | ICD-10-CM | POA: Diagnosis not present

## 2018-12-14 DIAGNOSIS — N2581 Secondary hyperparathyroidism of renal origin: Secondary | ICD-10-CM | POA: Diagnosis not present

## 2018-12-14 DIAGNOSIS — N186 End stage renal disease: Secondary | ICD-10-CM | POA: Diagnosis not present

## 2018-12-15 DIAGNOSIS — N186 End stage renal disease: Secondary | ICD-10-CM | POA: Diagnosis not present

## 2018-12-15 DIAGNOSIS — D631 Anemia in chronic kidney disease: Secondary | ICD-10-CM | POA: Diagnosis not present

## 2018-12-15 DIAGNOSIS — D509 Iron deficiency anemia, unspecified: Secondary | ICD-10-CM | POA: Diagnosis not present

## 2018-12-15 DIAGNOSIS — Z992 Dependence on renal dialysis: Secondary | ICD-10-CM | POA: Diagnosis not present

## 2018-12-15 DIAGNOSIS — N2581 Secondary hyperparathyroidism of renal origin: Secondary | ICD-10-CM | POA: Diagnosis not present

## 2018-12-16 DIAGNOSIS — D509 Iron deficiency anemia, unspecified: Secondary | ICD-10-CM | POA: Diagnosis not present

## 2018-12-16 DIAGNOSIS — D631 Anemia in chronic kidney disease: Secondary | ICD-10-CM | POA: Diagnosis not present

## 2018-12-16 DIAGNOSIS — N186 End stage renal disease: Secondary | ICD-10-CM | POA: Diagnosis not present

## 2018-12-16 DIAGNOSIS — Z992 Dependence on renal dialysis: Secondary | ICD-10-CM | POA: Diagnosis not present

## 2018-12-16 DIAGNOSIS — N2581 Secondary hyperparathyroidism of renal origin: Secondary | ICD-10-CM | POA: Diagnosis not present

## 2018-12-17 DIAGNOSIS — D631 Anemia in chronic kidney disease: Secondary | ICD-10-CM | POA: Diagnosis not present

## 2018-12-17 DIAGNOSIS — D509 Iron deficiency anemia, unspecified: Secondary | ICD-10-CM | POA: Diagnosis not present

## 2018-12-17 DIAGNOSIS — N186 End stage renal disease: Secondary | ICD-10-CM | POA: Diagnosis not present

## 2018-12-17 DIAGNOSIS — N2581 Secondary hyperparathyroidism of renal origin: Secondary | ICD-10-CM | POA: Diagnosis not present

## 2018-12-17 DIAGNOSIS — Z992 Dependence on renal dialysis: Secondary | ICD-10-CM | POA: Diagnosis not present

## 2018-12-18 DIAGNOSIS — D509 Iron deficiency anemia, unspecified: Secondary | ICD-10-CM | POA: Diagnosis not present

## 2018-12-18 DIAGNOSIS — N186 End stage renal disease: Secondary | ICD-10-CM | POA: Diagnosis not present

## 2018-12-18 DIAGNOSIS — Z992 Dependence on renal dialysis: Secondary | ICD-10-CM | POA: Diagnosis not present

## 2018-12-18 DIAGNOSIS — D631 Anemia in chronic kidney disease: Secondary | ICD-10-CM | POA: Diagnosis not present

## 2018-12-18 DIAGNOSIS — N2581 Secondary hyperparathyroidism of renal origin: Secondary | ICD-10-CM | POA: Diagnosis not present

## 2018-12-19 DIAGNOSIS — N186 End stage renal disease: Secondary | ICD-10-CM | POA: Diagnosis not present

## 2018-12-19 DIAGNOSIS — D509 Iron deficiency anemia, unspecified: Secondary | ICD-10-CM | POA: Diagnosis not present

## 2018-12-19 DIAGNOSIS — Z992 Dependence on renal dialysis: Secondary | ICD-10-CM | POA: Diagnosis not present

## 2018-12-19 DIAGNOSIS — D631 Anemia in chronic kidney disease: Secondary | ICD-10-CM | POA: Diagnosis not present

## 2018-12-19 DIAGNOSIS — N2581 Secondary hyperparathyroidism of renal origin: Secondary | ICD-10-CM | POA: Diagnosis not present

## 2018-12-20 DIAGNOSIS — D509 Iron deficiency anemia, unspecified: Secondary | ICD-10-CM | POA: Diagnosis not present

## 2018-12-20 DIAGNOSIS — D631 Anemia in chronic kidney disease: Secondary | ICD-10-CM | POA: Diagnosis not present

## 2018-12-20 DIAGNOSIS — Z992 Dependence on renal dialysis: Secondary | ICD-10-CM | POA: Diagnosis not present

## 2018-12-20 DIAGNOSIS — N2581 Secondary hyperparathyroidism of renal origin: Secondary | ICD-10-CM | POA: Diagnosis not present

## 2018-12-20 DIAGNOSIS — N186 End stage renal disease: Secondary | ICD-10-CM | POA: Diagnosis not present

## 2018-12-21 DIAGNOSIS — Z992 Dependence on renal dialysis: Secondary | ICD-10-CM | POA: Diagnosis not present

## 2018-12-21 DIAGNOSIS — D509 Iron deficiency anemia, unspecified: Secondary | ICD-10-CM | POA: Diagnosis not present

## 2018-12-21 DIAGNOSIS — N186 End stage renal disease: Secondary | ICD-10-CM | POA: Diagnosis not present

## 2018-12-21 DIAGNOSIS — D631 Anemia in chronic kidney disease: Secondary | ICD-10-CM | POA: Diagnosis not present

## 2018-12-21 DIAGNOSIS — N2581 Secondary hyperparathyroidism of renal origin: Secondary | ICD-10-CM | POA: Diagnosis not present

## 2018-12-22 DIAGNOSIS — N186 End stage renal disease: Secondary | ICD-10-CM | POA: Diagnosis not present

## 2018-12-22 DIAGNOSIS — Z992 Dependence on renal dialysis: Secondary | ICD-10-CM | POA: Diagnosis not present

## 2018-12-22 DIAGNOSIS — N2581 Secondary hyperparathyroidism of renal origin: Secondary | ICD-10-CM | POA: Diagnosis not present

## 2018-12-22 DIAGNOSIS — D631 Anemia in chronic kidney disease: Secondary | ICD-10-CM | POA: Diagnosis not present

## 2018-12-22 DIAGNOSIS — D509 Iron deficiency anemia, unspecified: Secondary | ICD-10-CM | POA: Diagnosis not present

## 2018-12-23 DIAGNOSIS — N186 End stage renal disease: Secondary | ICD-10-CM | POA: Diagnosis not present

## 2018-12-23 DIAGNOSIS — Z992 Dependence on renal dialysis: Secondary | ICD-10-CM | POA: Diagnosis not present

## 2018-12-23 DIAGNOSIS — N2581 Secondary hyperparathyroidism of renal origin: Secondary | ICD-10-CM | POA: Diagnosis not present

## 2018-12-23 DIAGNOSIS — D509 Iron deficiency anemia, unspecified: Secondary | ICD-10-CM | POA: Diagnosis not present

## 2018-12-23 DIAGNOSIS — D631 Anemia in chronic kidney disease: Secondary | ICD-10-CM | POA: Diagnosis not present

## 2018-12-24 DIAGNOSIS — D631 Anemia in chronic kidney disease: Secondary | ICD-10-CM | POA: Diagnosis not present

## 2018-12-24 DIAGNOSIS — Z992 Dependence on renal dialysis: Secondary | ICD-10-CM | POA: Diagnosis not present

## 2018-12-24 DIAGNOSIS — N2581 Secondary hyperparathyroidism of renal origin: Secondary | ICD-10-CM | POA: Diagnosis not present

## 2018-12-24 DIAGNOSIS — N186 End stage renal disease: Secondary | ICD-10-CM | POA: Diagnosis not present

## 2018-12-24 DIAGNOSIS — D509 Iron deficiency anemia, unspecified: Secondary | ICD-10-CM | POA: Diagnosis not present

## 2018-12-25 DIAGNOSIS — Z992 Dependence on renal dialysis: Secondary | ICD-10-CM | POA: Diagnosis not present

## 2018-12-25 DIAGNOSIS — N186 End stage renal disease: Secondary | ICD-10-CM | POA: Diagnosis not present

## 2018-12-25 DIAGNOSIS — D631 Anemia in chronic kidney disease: Secondary | ICD-10-CM | POA: Diagnosis not present

## 2018-12-25 DIAGNOSIS — D509 Iron deficiency anemia, unspecified: Secondary | ICD-10-CM | POA: Diagnosis not present

## 2018-12-25 DIAGNOSIS — N2581 Secondary hyperparathyroidism of renal origin: Secondary | ICD-10-CM | POA: Diagnosis not present

## 2018-12-26 DIAGNOSIS — D509 Iron deficiency anemia, unspecified: Secondary | ICD-10-CM | POA: Diagnosis not present

## 2018-12-26 DIAGNOSIS — D631 Anemia in chronic kidney disease: Secondary | ICD-10-CM | POA: Diagnosis not present

## 2018-12-26 DIAGNOSIS — N186 End stage renal disease: Secondary | ICD-10-CM | POA: Diagnosis not present

## 2018-12-26 DIAGNOSIS — Z992 Dependence on renal dialysis: Secondary | ICD-10-CM | POA: Diagnosis not present

## 2018-12-26 DIAGNOSIS — N2581 Secondary hyperparathyroidism of renal origin: Secondary | ICD-10-CM | POA: Diagnosis not present

## 2018-12-27 DIAGNOSIS — Z992 Dependence on renal dialysis: Secondary | ICD-10-CM | POA: Diagnosis not present

## 2018-12-27 DIAGNOSIS — D631 Anemia in chronic kidney disease: Secondary | ICD-10-CM | POA: Diagnosis not present

## 2018-12-27 DIAGNOSIS — N186 End stage renal disease: Secondary | ICD-10-CM | POA: Diagnosis not present

## 2018-12-27 DIAGNOSIS — D509 Iron deficiency anemia, unspecified: Secondary | ICD-10-CM | POA: Diagnosis not present

## 2018-12-27 DIAGNOSIS — N2581 Secondary hyperparathyroidism of renal origin: Secondary | ICD-10-CM | POA: Diagnosis not present

## 2018-12-28 DIAGNOSIS — D509 Iron deficiency anemia, unspecified: Secondary | ICD-10-CM | POA: Diagnosis not present

## 2018-12-28 DIAGNOSIS — N2581 Secondary hyperparathyroidism of renal origin: Secondary | ICD-10-CM | POA: Diagnosis not present

## 2018-12-28 DIAGNOSIS — Z992 Dependence on renal dialysis: Secondary | ICD-10-CM | POA: Diagnosis not present

## 2018-12-28 DIAGNOSIS — D631 Anemia in chronic kidney disease: Secondary | ICD-10-CM | POA: Diagnosis not present

## 2018-12-28 DIAGNOSIS — N186 End stage renal disease: Secondary | ICD-10-CM | POA: Diagnosis not present

## 2018-12-29 DIAGNOSIS — N186 End stage renal disease: Secondary | ICD-10-CM | POA: Diagnosis not present

## 2018-12-29 DIAGNOSIS — D509 Iron deficiency anemia, unspecified: Secondary | ICD-10-CM | POA: Diagnosis not present

## 2018-12-29 DIAGNOSIS — Z992 Dependence on renal dialysis: Secondary | ICD-10-CM | POA: Diagnosis not present

## 2018-12-29 DIAGNOSIS — N2581 Secondary hyperparathyroidism of renal origin: Secondary | ICD-10-CM | POA: Diagnosis not present

## 2018-12-29 DIAGNOSIS — D631 Anemia in chronic kidney disease: Secondary | ICD-10-CM | POA: Diagnosis not present

## 2018-12-30 DIAGNOSIS — N2581 Secondary hyperparathyroidism of renal origin: Secondary | ICD-10-CM | POA: Diagnosis not present

## 2018-12-30 DIAGNOSIS — Z992 Dependence on renal dialysis: Secondary | ICD-10-CM | POA: Diagnosis not present

## 2018-12-30 DIAGNOSIS — D631 Anemia in chronic kidney disease: Secondary | ICD-10-CM | POA: Diagnosis not present

## 2018-12-30 DIAGNOSIS — D509 Iron deficiency anemia, unspecified: Secondary | ICD-10-CM | POA: Diagnosis not present

## 2018-12-30 DIAGNOSIS — N186 End stage renal disease: Secondary | ICD-10-CM | POA: Diagnosis not present

## 2018-12-31 DIAGNOSIS — Z992 Dependence on renal dialysis: Secondary | ICD-10-CM | POA: Diagnosis not present

## 2018-12-31 DIAGNOSIS — D631 Anemia in chronic kidney disease: Secondary | ICD-10-CM | POA: Diagnosis not present

## 2018-12-31 DIAGNOSIS — N2581 Secondary hyperparathyroidism of renal origin: Secondary | ICD-10-CM | POA: Diagnosis not present

## 2018-12-31 DIAGNOSIS — N186 End stage renal disease: Secondary | ICD-10-CM | POA: Diagnosis not present

## 2018-12-31 DIAGNOSIS — D509 Iron deficiency anemia, unspecified: Secondary | ICD-10-CM | POA: Diagnosis not present

## 2019-01-01 DIAGNOSIS — D509 Iron deficiency anemia, unspecified: Secondary | ICD-10-CM | POA: Diagnosis not present

## 2019-01-01 DIAGNOSIS — N186 End stage renal disease: Secondary | ICD-10-CM | POA: Diagnosis not present

## 2019-01-01 DIAGNOSIS — N2581 Secondary hyperparathyroidism of renal origin: Secondary | ICD-10-CM | POA: Diagnosis not present

## 2019-01-01 DIAGNOSIS — D631 Anemia in chronic kidney disease: Secondary | ICD-10-CM | POA: Diagnosis not present

## 2019-01-01 DIAGNOSIS — Z992 Dependence on renal dialysis: Secondary | ICD-10-CM | POA: Diagnosis not present

## 2019-01-02 DIAGNOSIS — D631 Anemia in chronic kidney disease: Secondary | ICD-10-CM | POA: Diagnosis not present

## 2019-01-02 DIAGNOSIS — D509 Iron deficiency anemia, unspecified: Secondary | ICD-10-CM | POA: Diagnosis not present

## 2019-01-02 DIAGNOSIS — N2581 Secondary hyperparathyroidism of renal origin: Secondary | ICD-10-CM | POA: Diagnosis not present

## 2019-01-02 DIAGNOSIS — N186 End stage renal disease: Secondary | ICD-10-CM | POA: Diagnosis not present

## 2019-01-02 DIAGNOSIS — Z992 Dependence on renal dialysis: Secondary | ICD-10-CM | POA: Diagnosis not present

## 2019-01-03 DIAGNOSIS — N186 End stage renal disease: Secondary | ICD-10-CM | POA: Diagnosis not present

## 2019-01-03 DIAGNOSIS — N2581 Secondary hyperparathyroidism of renal origin: Secondary | ICD-10-CM | POA: Diagnosis not present

## 2019-01-03 DIAGNOSIS — Z992 Dependence on renal dialysis: Secondary | ICD-10-CM | POA: Diagnosis not present

## 2019-01-03 DIAGNOSIS — D509 Iron deficiency anemia, unspecified: Secondary | ICD-10-CM | POA: Diagnosis not present

## 2019-01-03 DIAGNOSIS — D631 Anemia in chronic kidney disease: Secondary | ICD-10-CM | POA: Diagnosis not present

## 2019-01-04 DIAGNOSIS — D631 Anemia in chronic kidney disease: Secondary | ICD-10-CM | POA: Diagnosis not present

## 2019-01-04 DIAGNOSIS — Z992 Dependence on renal dialysis: Secondary | ICD-10-CM | POA: Diagnosis not present

## 2019-01-04 DIAGNOSIS — N2581 Secondary hyperparathyroidism of renal origin: Secondary | ICD-10-CM | POA: Diagnosis not present

## 2019-01-04 DIAGNOSIS — N186 End stage renal disease: Secondary | ICD-10-CM | POA: Diagnosis not present

## 2019-01-04 DIAGNOSIS — D509 Iron deficiency anemia, unspecified: Secondary | ICD-10-CM | POA: Diagnosis not present

## 2019-01-05 DIAGNOSIS — Z992 Dependence on renal dialysis: Secondary | ICD-10-CM | POA: Diagnosis not present

## 2019-01-05 DIAGNOSIS — D631 Anemia in chronic kidney disease: Secondary | ICD-10-CM | POA: Diagnosis not present

## 2019-01-05 DIAGNOSIS — N186 End stage renal disease: Secondary | ICD-10-CM | POA: Diagnosis not present

## 2019-01-05 DIAGNOSIS — N2581 Secondary hyperparathyroidism of renal origin: Secondary | ICD-10-CM | POA: Diagnosis not present

## 2019-01-05 DIAGNOSIS — D509 Iron deficiency anemia, unspecified: Secondary | ICD-10-CM | POA: Diagnosis not present

## 2019-01-06 DIAGNOSIS — N186 End stage renal disease: Secondary | ICD-10-CM | POA: Diagnosis not present

## 2019-01-06 DIAGNOSIS — D631 Anemia in chronic kidney disease: Secondary | ICD-10-CM | POA: Diagnosis not present

## 2019-01-06 DIAGNOSIS — Z992 Dependence on renal dialysis: Secondary | ICD-10-CM | POA: Diagnosis not present

## 2019-01-06 DIAGNOSIS — N2581 Secondary hyperparathyroidism of renal origin: Secondary | ICD-10-CM | POA: Diagnosis not present

## 2019-01-06 DIAGNOSIS — D509 Iron deficiency anemia, unspecified: Secondary | ICD-10-CM | POA: Diagnosis not present

## 2019-01-07 DIAGNOSIS — M79672 Pain in left foot: Secondary | ICD-10-CM | POA: Diagnosis not present

## 2019-01-07 DIAGNOSIS — M79671 Pain in right foot: Secondary | ICD-10-CM | POA: Diagnosis not present

## 2019-01-07 DIAGNOSIS — Z992 Dependence on renal dialysis: Secondary | ICD-10-CM | POA: Diagnosis not present

## 2019-01-07 DIAGNOSIS — N2581 Secondary hyperparathyroidism of renal origin: Secondary | ICD-10-CM | POA: Diagnosis not present

## 2019-01-07 DIAGNOSIS — I739 Peripheral vascular disease, unspecified: Secondary | ICD-10-CM | POA: Diagnosis not present

## 2019-01-07 DIAGNOSIS — D509 Iron deficiency anemia, unspecified: Secondary | ICD-10-CM | POA: Diagnosis not present

## 2019-01-07 DIAGNOSIS — L110 Acquired keratosis follicularis: Secondary | ICD-10-CM | POA: Diagnosis not present

## 2019-01-07 DIAGNOSIS — D631 Anemia in chronic kidney disease: Secondary | ICD-10-CM | POA: Diagnosis not present

## 2019-01-07 DIAGNOSIS — N186 End stage renal disease: Secondary | ICD-10-CM | POA: Diagnosis not present

## 2019-01-08 DIAGNOSIS — N186 End stage renal disease: Secondary | ICD-10-CM | POA: Diagnosis not present

## 2019-01-08 DIAGNOSIS — D509 Iron deficiency anemia, unspecified: Secondary | ICD-10-CM | POA: Diagnosis not present

## 2019-01-08 DIAGNOSIS — Z992 Dependence on renal dialysis: Secondary | ICD-10-CM | POA: Diagnosis not present

## 2019-01-08 DIAGNOSIS — N2581 Secondary hyperparathyroidism of renal origin: Secondary | ICD-10-CM | POA: Diagnosis not present

## 2019-01-08 DIAGNOSIS — D631 Anemia in chronic kidney disease: Secondary | ICD-10-CM | POA: Diagnosis not present

## 2019-01-09 DIAGNOSIS — N2581 Secondary hyperparathyroidism of renal origin: Secondary | ICD-10-CM | POA: Diagnosis not present

## 2019-01-09 DIAGNOSIS — D509 Iron deficiency anemia, unspecified: Secondary | ICD-10-CM | POA: Diagnosis not present

## 2019-01-09 DIAGNOSIS — D631 Anemia in chronic kidney disease: Secondary | ICD-10-CM | POA: Diagnosis not present

## 2019-01-09 DIAGNOSIS — Z992 Dependence on renal dialysis: Secondary | ICD-10-CM | POA: Diagnosis not present

## 2019-01-09 DIAGNOSIS — N186 End stage renal disease: Secondary | ICD-10-CM | POA: Diagnosis not present

## 2019-01-10 DIAGNOSIS — N2581 Secondary hyperparathyroidism of renal origin: Secondary | ICD-10-CM | POA: Diagnosis not present

## 2019-01-10 DIAGNOSIS — D509 Iron deficiency anemia, unspecified: Secondary | ICD-10-CM | POA: Diagnosis not present

## 2019-01-10 DIAGNOSIS — D631 Anemia in chronic kidney disease: Secondary | ICD-10-CM | POA: Diagnosis not present

## 2019-01-10 DIAGNOSIS — Z992 Dependence on renal dialysis: Secondary | ICD-10-CM | POA: Diagnosis not present

## 2019-01-10 DIAGNOSIS — N186 End stage renal disease: Secondary | ICD-10-CM | POA: Diagnosis not present

## 2019-01-11 DIAGNOSIS — D631 Anemia in chronic kidney disease: Secondary | ICD-10-CM | POA: Diagnosis not present

## 2019-01-11 DIAGNOSIS — N186 End stage renal disease: Secondary | ICD-10-CM | POA: Diagnosis not present

## 2019-01-11 DIAGNOSIS — Z992 Dependence on renal dialysis: Secondary | ICD-10-CM | POA: Diagnosis not present

## 2019-01-11 DIAGNOSIS — N2581 Secondary hyperparathyroidism of renal origin: Secondary | ICD-10-CM | POA: Diagnosis not present

## 2019-01-11 DIAGNOSIS — D509 Iron deficiency anemia, unspecified: Secondary | ICD-10-CM | POA: Diagnosis not present

## 2019-01-12 DIAGNOSIS — D509 Iron deficiency anemia, unspecified: Secondary | ICD-10-CM | POA: Diagnosis not present

## 2019-01-12 DIAGNOSIS — N186 End stage renal disease: Secondary | ICD-10-CM | POA: Diagnosis not present

## 2019-01-12 DIAGNOSIS — D631 Anemia in chronic kidney disease: Secondary | ICD-10-CM | POA: Diagnosis not present

## 2019-01-12 DIAGNOSIS — Z992 Dependence on renal dialysis: Secondary | ICD-10-CM | POA: Diagnosis not present

## 2019-01-12 DIAGNOSIS — N2581 Secondary hyperparathyroidism of renal origin: Secondary | ICD-10-CM | POA: Diagnosis not present

## 2019-01-13 DIAGNOSIS — D631 Anemia in chronic kidney disease: Secondary | ICD-10-CM | POA: Diagnosis not present

## 2019-01-13 DIAGNOSIS — Z992 Dependence on renal dialysis: Secondary | ICD-10-CM | POA: Diagnosis not present

## 2019-01-13 DIAGNOSIS — D509 Iron deficiency anemia, unspecified: Secondary | ICD-10-CM | POA: Diagnosis not present

## 2019-01-13 DIAGNOSIS — N186 End stage renal disease: Secondary | ICD-10-CM | POA: Diagnosis not present

## 2019-01-13 DIAGNOSIS — N2581 Secondary hyperparathyroidism of renal origin: Secondary | ICD-10-CM | POA: Diagnosis not present

## 2019-01-14 DIAGNOSIS — D631 Anemia in chronic kidney disease: Secondary | ICD-10-CM | POA: Diagnosis not present

## 2019-01-14 DIAGNOSIS — D509 Iron deficiency anemia, unspecified: Secondary | ICD-10-CM | POA: Diagnosis not present

## 2019-01-14 DIAGNOSIS — Z992 Dependence on renal dialysis: Secondary | ICD-10-CM | POA: Diagnosis not present

## 2019-01-14 DIAGNOSIS — N186 End stage renal disease: Secondary | ICD-10-CM | POA: Diagnosis not present

## 2019-01-14 DIAGNOSIS — N2581 Secondary hyperparathyroidism of renal origin: Secondary | ICD-10-CM | POA: Diagnosis not present

## 2019-01-15 DIAGNOSIS — D509 Iron deficiency anemia, unspecified: Secondary | ICD-10-CM | POA: Diagnosis not present

## 2019-01-15 DIAGNOSIS — N2581 Secondary hyperparathyroidism of renal origin: Secondary | ICD-10-CM | POA: Diagnosis not present

## 2019-01-15 DIAGNOSIS — N186 End stage renal disease: Secondary | ICD-10-CM | POA: Diagnosis not present

## 2019-01-15 DIAGNOSIS — Z992 Dependence on renal dialysis: Secondary | ICD-10-CM | POA: Diagnosis not present

## 2019-01-15 DIAGNOSIS — D631 Anemia in chronic kidney disease: Secondary | ICD-10-CM | POA: Diagnosis not present

## 2019-01-16 DIAGNOSIS — D631 Anemia in chronic kidney disease: Secondary | ICD-10-CM | POA: Diagnosis not present

## 2019-01-16 DIAGNOSIS — D509 Iron deficiency anemia, unspecified: Secondary | ICD-10-CM | POA: Diagnosis not present

## 2019-01-16 DIAGNOSIS — N186 End stage renal disease: Secondary | ICD-10-CM | POA: Diagnosis not present

## 2019-01-16 DIAGNOSIS — Z992 Dependence on renal dialysis: Secondary | ICD-10-CM | POA: Diagnosis not present

## 2019-01-16 DIAGNOSIS — N2581 Secondary hyperparathyroidism of renal origin: Secondary | ICD-10-CM | POA: Diagnosis not present

## 2019-01-17 DIAGNOSIS — N186 End stage renal disease: Secondary | ICD-10-CM | POA: Diagnosis not present

## 2019-01-17 DIAGNOSIS — D631 Anemia in chronic kidney disease: Secondary | ICD-10-CM | POA: Diagnosis not present

## 2019-01-17 DIAGNOSIS — N2581 Secondary hyperparathyroidism of renal origin: Secondary | ICD-10-CM | POA: Diagnosis not present

## 2019-01-17 DIAGNOSIS — Z992 Dependence on renal dialysis: Secondary | ICD-10-CM | POA: Diagnosis not present

## 2019-01-17 DIAGNOSIS — D509 Iron deficiency anemia, unspecified: Secondary | ICD-10-CM | POA: Diagnosis not present

## 2019-01-18 DIAGNOSIS — N2581 Secondary hyperparathyroidism of renal origin: Secondary | ICD-10-CM | POA: Diagnosis not present

## 2019-01-18 DIAGNOSIS — N186 End stage renal disease: Secondary | ICD-10-CM | POA: Diagnosis not present

## 2019-01-18 DIAGNOSIS — D631 Anemia in chronic kidney disease: Secondary | ICD-10-CM | POA: Diagnosis not present

## 2019-01-18 DIAGNOSIS — D509 Iron deficiency anemia, unspecified: Secondary | ICD-10-CM | POA: Diagnosis not present

## 2019-01-18 DIAGNOSIS — Z992 Dependence on renal dialysis: Secondary | ICD-10-CM | POA: Diagnosis not present

## 2019-01-19 DIAGNOSIS — Z992 Dependence on renal dialysis: Secondary | ICD-10-CM | POA: Diagnosis not present

## 2019-01-19 DIAGNOSIS — N186 End stage renal disease: Secondary | ICD-10-CM | POA: Diagnosis not present

## 2019-01-19 DIAGNOSIS — D631 Anemia in chronic kidney disease: Secondary | ICD-10-CM | POA: Diagnosis not present

## 2019-01-19 DIAGNOSIS — N2581 Secondary hyperparathyroidism of renal origin: Secondary | ICD-10-CM | POA: Diagnosis not present

## 2019-01-19 DIAGNOSIS — D509 Iron deficiency anemia, unspecified: Secondary | ICD-10-CM | POA: Diagnosis not present

## 2019-01-20 DIAGNOSIS — D631 Anemia in chronic kidney disease: Secondary | ICD-10-CM | POA: Diagnosis not present

## 2019-01-20 DIAGNOSIS — N2581 Secondary hyperparathyroidism of renal origin: Secondary | ICD-10-CM | POA: Diagnosis not present

## 2019-01-20 DIAGNOSIS — Z992 Dependence on renal dialysis: Secondary | ICD-10-CM | POA: Diagnosis not present

## 2019-01-20 DIAGNOSIS — D509 Iron deficiency anemia, unspecified: Secondary | ICD-10-CM | POA: Diagnosis not present

## 2019-01-20 DIAGNOSIS — N186 End stage renal disease: Secondary | ICD-10-CM | POA: Diagnosis not present

## 2019-01-21 DIAGNOSIS — D509 Iron deficiency anemia, unspecified: Secondary | ICD-10-CM | POA: Diagnosis not present

## 2019-01-21 DIAGNOSIS — N2581 Secondary hyperparathyroidism of renal origin: Secondary | ICD-10-CM | POA: Diagnosis not present

## 2019-01-21 DIAGNOSIS — N186 End stage renal disease: Secondary | ICD-10-CM | POA: Diagnosis not present

## 2019-01-21 DIAGNOSIS — D631 Anemia in chronic kidney disease: Secondary | ICD-10-CM | POA: Diagnosis not present

## 2019-01-21 DIAGNOSIS — Z992 Dependence on renal dialysis: Secondary | ICD-10-CM | POA: Diagnosis not present

## 2019-01-22 DIAGNOSIS — N186 End stage renal disease: Secondary | ICD-10-CM | POA: Diagnosis not present

## 2019-01-22 DIAGNOSIS — Z992 Dependence on renal dialysis: Secondary | ICD-10-CM | POA: Diagnosis not present

## 2019-01-22 DIAGNOSIS — N2581 Secondary hyperparathyroidism of renal origin: Secondary | ICD-10-CM | POA: Diagnosis not present

## 2019-01-22 DIAGNOSIS — D631 Anemia in chronic kidney disease: Secondary | ICD-10-CM | POA: Diagnosis not present

## 2019-01-22 DIAGNOSIS — D509 Iron deficiency anemia, unspecified: Secondary | ICD-10-CM | POA: Diagnosis not present

## 2019-01-23 DIAGNOSIS — Z992 Dependence on renal dialysis: Secondary | ICD-10-CM | POA: Diagnosis not present

## 2019-01-23 DIAGNOSIS — N2581 Secondary hyperparathyroidism of renal origin: Secondary | ICD-10-CM | POA: Diagnosis not present

## 2019-01-23 DIAGNOSIS — D509 Iron deficiency anemia, unspecified: Secondary | ICD-10-CM | POA: Diagnosis not present

## 2019-01-23 DIAGNOSIS — D631 Anemia in chronic kidney disease: Secondary | ICD-10-CM | POA: Diagnosis not present

## 2019-01-23 DIAGNOSIS — N186 End stage renal disease: Secondary | ICD-10-CM | POA: Diagnosis not present

## 2019-01-24 DIAGNOSIS — Z992 Dependence on renal dialysis: Secondary | ICD-10-CM | POA: Diagnosis not present

## 2019-01-24 DIAGNOSIS — N186 End stage renal disease: Secondary | ICD-10-CM | POA: Diagnosis not present

## 2019-01-24 DIAGNOSIS — D631 Anemia in chronic kidney disease: Secondary | ICD-10-CM | POA: Diagnosis not present

## 2019-01-24 DIAGNOSIS — N2581 Secondary hyperparathyroidism of renal origin: Secondary | ICD-10-CM | POA: Diagnosis not present

## 2019-01-24 DIAGNOSIS — D509 Iron deficiency anemia, unspecified: Secondary | ICD-10-CM | POA: Diagnosis not present

## 2019-01-25 DIAGNOSIS — D509 Iron deficiency anemia, unspecified: Secondary | ICD-10-CM | POA: Diagnosis not present

## 2019-01-25 DIAGNOSIS — N2581 Secondary hyperparathyroidism of renal origin: Secondary | ICD-10-CM | POA: Diagnosis not present

## 2019-01-25 DIAGNOSIS — Z992 Dependence on renal dialysis: Secondary | ICD-10-CM | POA: Diagnosis not present

## 2019-01-25 DIAGNOSIS — D631 Anemia in chronic kidney disease: Secondary | ICD-10-CM | POA: Diagnosis not present

## 2019-01-25 DIAGNOSIS — N186 End stage renal disease: Secondary | ICD-10-CM | POA: Diagnosis not present

## 2019-01-26 DIAGNOSIS — D509 Iron deficiency anemia, unspecified: Secondary | ICD-10-CM | POA: Diagnosis not present

## 2019-01-26 DIAGNOSIS — N2581 Secondary hyperparathyroidism of renal origin: Secondary | ICD-10-CM | POA: Diagnosis not present

## 2019-01-26 DIAGNOSIS — D631 Anemia in chronic kidney disease: Secondary | ICD-10-CM | POA: Diagnosis not present

## 2019-01-26 DIAGNOSIS — N186 End stage renal disease: Secondary | ICD-10-CM | POA: Diagnosis not present

## 2019-01-26 DIAGNOSIS — Z992 Dependence on renal dialysis: Secondary | ICD-10-CM | POA: Diagnosis not present

## 2019-01-27 DIAGNOSIS — N2581 Secondary hyperparathyroidism of renal origin: Secondary | ICD-10-CM | POA: Diagnosis not present

## 2019-01-27 DIAGNOSIS — D509 Iron deficiency anemia, unspecified: Secondary | ICD-10-CM | POA: Diagnosis not present

## 2019-01-27 DIAGNOSIS — Z992 Dependence on renal dialysis: Secondary | ICD-10-CM | POA: Diagnosis not present

## 2019-01-27 DIAGNOSIS — D631 Anemia in chronic kidney disease: Secondary | ICD-10-CM | POA: Diagnosis not present

## 2019-01-27 DIAGNOSIS — N186 End stage renal disease: Secondary | ICD-10-CM | POA: Diagnosis not present

## 2019-01-28 DIAGNOSIS — N2581 Secondary hyperparathyroidism of renal origin: Secondary | ICD-10-CM | POA: Diagnosis not present

## 2019-01-28 DIAGNOSIS — D631 Anemia in chronic kidney disease: Secondary | ICD-10-CM | POA: Diagnosis not present

## 2019-01-28 DIAGNOSIS — Z992 Dependence on renal dialysis: Secondary | ICD-10-CM | POA: Diagnosis not present

## 2019-01-28 DIAGNOSIS — D509 Iron deficiency anemia, unspecified: Secondary | ICD-10-CM | POA: Diagnosis not present

## 2019-01-28 DIAGNOSIS — N186 End stage renal disease: Secondary | ICD-10-CM | POA: Diagnosis not present

## 2019-01-29 DIAGNOSIS — D509 Iron deficiency anemia, unspecified: Secondary | ICD-10-CM | POA: Diagnosis not present

## 2019-01-29 DIAGNOSIS — N2581 Secondary hyperparathyroidism of renal origin: Secondary | ICD-10-CM | POA: Diagnosis not present

## 2019-01-29 DIAGNOSIS — Z992 Dependence on renal dialysis: Secondary | ICD-10-CM | POA: Diagnosis not present

## 2019-01-29 DIAGNOSIS — N186 End stage renal disease: Secondary | ICD-10-CM | POA: Diagnosis not present

## 2019-01-29 DIAGNOSIS — D631 Anemia in chronic kidney disease: Secondary | ICD-10-CM | POA: Diagnosis not present

## 2019-01-30 DIAGNOSIS — N2581 Secondary hyperparathyroidism of renal origin: Secondary | ICD-10-CM | POA: Diagnosis not present

## 2019-01-30 DIAGNOSIS — D509 Iron deficiency anemia, unspecified: Secondary | ICD-10-CM | POA: Diagnosis not present

## 2019-01-30 DIAGNOSIS — D631 Anemia in chronic kidney disease: Secondary | ICD-10-CM | POA: Diagnosis not present

## 2019-01-30 DIAGNOSIS — Z992 Dependence on renal dialysis: Secondary | ICD-10-CM | POA: Diagnosis not present

## 2019-01-30 DIAGNOSIS — N186 End stage renal disease: Secondary | ICD-10-CM | POA: Diagnosis not present

## 2019-01-31 ENCOUNTER — Other Ambulatory Visit: Payer: Self-pay

## 2019-01-31 ENCOUNTER — Inpatient Hospital Stay (HOSPITAL_COMMUNITY): Payer: Medicare Other | Attending: Hematology

## 2019-01-31 DIAGNOSIS — Z809 Family history of malignant neoplasm, unspecified: Secondary | ICD-10-CM | POA: Diagnosis not present

## 2019-01-31 DIAGNOSIS — Z85528 Personal history of other malignant neoplasm of kidney: Secondary | ICD-10-CM | POA: Insufficient documentation

## 2019-01-31 DIAGNOSIS — Z992 Dependence on renal dialysis: Secondary | ICD-10-CM | POA: Insufficient documentation

## 2019-01-31 DIAGNOSIS — D509 Iron deficiency anemia, unspecified: Secondary | ICD-10-CM

## 2019-01-31 DIAGNOSIS — Z905 Acquired absence of kidney: Secondary | ICD-10-CM | POA: Insufficient documentation

## 2019-01-31 DIAGNOSIS — D631 Anemia in chronic kidney disease: Secondary | ICD-10-CM | POA: Diagnosis not present

## 2019-01-31 DIAGNOSIS — Z9221 Personal history of antineoplastic chemotherapy: Secondary | ICD-10-CM | POA: Insufficient documentation

## 2019-01-31 DIAGNOSIS — Z79899 Other long term (current) drug therapy: Secondary | ICD-10-CM | POA: Diagnosis not present

## 2019-01-31 DIAGNOSIS — Z9079 Acquired absence of other genital organ(s): Secondary | ICD-10-CM | POA: Diagnosis not present

## 2019-01-31 DIAGNOSIS — N186 End stage renal disease: Secondary | ICD-10-CM | POA: Diagnosis not present

## 2019-01-31 DIAGNOSIS — F329 Major depressive disorder, single episode, unspecified: Secondary | ICD-10-CM | POA: Insufficient documentation

## 2019-01-31 DIAGNOSIS — I10 Essential (primary) hypertension: Secondary | ICD-10-CM | POA: Diagnosis not present

## 2019-01-31 DIAGNOSIS — Z8249 Family history of ischemic heart disease and other diseases of the circulatory system: Secondary | ICD-10-CM | POA: Diagnosis not present

## 2019-01-31 DIAGNOSIS — F419 Anxiety disorder, unspecified: Secondary | ICD-10-CM | POA: Insufficient documentation

## 2019-01-31 DIAGNOSIS — N2581 Secondary hyperparathyroidism of renal origin: Secondary | ICD-10-CM | POA: Diagnosis not present

## 2019-01-31 DIAGNOSIS — D539 Nutritional anemia, unspecified: Secondary | ICD-10-CM | POA: Insufficient documentation

## 2019-01-31 LAB — CBC WITH DIFFERENTIAL/PLATELET
Abs Immature Granulocytes: 0.04 10*3/uL (ref 0.00–0.07)
Basophils Absolute: 0.1 10*3/uL (ref 0.0–0.1)
Basophils Relative: 0 %
Eosinophils Absolute: 0.5 10*3/uL (ref 0.0–0.5)
Eosinophils Relative: 4 %
HCT: 33.8 % — ABNORMAL LOW (ref 39.0–52.0)
Hemoglobin: 11.1 g/dL — ABNORMAL LOW (ref 13.0–17.0)
Immature Granulocytes: 0 %
Lymphocytes Relative: 8 %
Lymphs Abs: 0.9 10*3/uL (ref 0.7–4.0)
MCH: 34.8 pg — ABNORMAL HIGH (ref 26.0–34.0)
MCHC: 32.8 g/dL (ref 30.0–36.0)
MCV: 106 fL — ABNORMAL HIGH (ref 80.0–100.0)
Monocytes Absolute: 1.4 10*3/uL — ABNORMAL HIGH (ref 0.1–1.0)
Monocytes Relative: 12 %
Neutro Abs: 8.5 10*3/uL — ABNORMAL HIGH (ref 1.7–7.7)
Neutrophils Relative %: 76 %
Platelets: 287 10*3/uL (ref 150–400)
RBC: 3.19 MIL/uL — ABNORMAL LOW (ref 4.22–5.81)
RDW: 13.2 % (ref 11.5–15.5)
WBC: 11.4 10*3/uL — ABNORMAL HIGH (ref 4.0–10.5)
nRBC: 0 % (ref 0.0–0.2)

## 2019-01-31 LAB — IRON AND TIBC
Iron: 103 ug/dL (ref 45–182)
Saturation Ratios: 42 % — ABNORMAL HIGH (ref 17.9–39.5)
TIBC: 246 ug/dL — ABNORMAL LOW (ref 250–450)
UIBC: 143 ug/dL

## 2019-01-31 LAB — VITAMIN B12: Vitamin B-12: 3734 pg/mL — ABNORMAL HIGH (ref 180–914)

## 2019-01-31 LAB — FERRITIN: Ferritin: 469 ng/mL — ABNORMAL HIGH (ref 24–336)

## 2019-02-01 DIAGNOSIS — N2581 Secondary hyperparathyroidism of renal origin: Secondary | ICD-10-CM | POA: Diagnosis not present

## 2019-02-01 DIAGNOSIS — D509 Iron deficiency anemia, unspecified: Secondary | ICD-10-CM | POA: Diagnosis not present

## 2019-02-01 DIAGNOSIS — Z992 Dependence on renal dialysis: Secondary | ICD-10-CM | POA: Diagnosis not present

## 2019-02-01 DIAGNOSIS — D631 Anemia in chronic kidney disease: Secondary | ICD-10-CM | POA: Diagnosis not present

## 2019-02-01 DIAGNOSIS — N186 End stage renal disease: Secondary | ICD-10-CM | POA: Diagnosis not present

## 2019-02-02 DIAGNOSIS — D509 Iron deficiency anemia, unspecified: Secondary | ICD-10-CM | POA: Diagnosis not present

## 2019-02-02 DIAGNOSIS — D631 Anemia in chronic kidney disease: Secondary | ICD-10-CM | POA: Diagnosis not present

## 2019-02-02 DIAGNOSIS — N2581 Secondary hyperparathyroidism of renal origin: Secondary | ICD-10-CM | POA: Diagnosis not present

## 2019-02-02 DIAGNOSIS — N186 End stage renal disease: Secondary | ICD-10-CM | POA: Diagnosis not present

## 2019-02-02 DIAGNOSIS — Z992 Dependence on renal dialysis: Secondary | ICD-10-CM | POA: Diagnosis not present

## 2019-02-03 DIAGNOSIS — D509 Iron deficiency anemia, unspecified: Secondary | ICD-10-CM | POA: Diagnosis not present

## 2019-02-03 DIAGNOSIS — D631 Anemia in chronic kidney disease: Secondary | ICD-10-CM | POA: Diagnosis not present

## 2019-02-03 DIAGNOSIS — Z992 Dependence on renal dialysis: Secondary | ICD-10-CM | POA: Diagnosis not present

## 2019-02-03 DIAGNOSIS — N186 End stage renal disease: Secondary | ICD-10-CM | POA: Diagnosis not present

## 2019-02-03 DIAGNOSIS — N2581 Secondary hyperparathyroidism of renal origin: Secondary | ICD-10-CM | POA: Diagnosis not present

## 2019-02-03 LAB — METHYLMALONIC ACID, SERUM: Methylmalonic Acid, Quantitative: 529 nmol/L — ABNORMAL HIGH (ref 0–378)

## 2019-02-04 DIAGNOSIS — N186 End stage renal disease: Secondary | ICD-10-CM | POA: Diagnosis not present

## 2019-02-04 DIAGNOSIS — D631 Anemia in chronic kidney disease: Secondary | ICD-10-CM | POA: Diagnosis not present

## 2019-02-04 DIAGNOSIS — D509 Iron deficiency anemia, unspecified: Secondary | ICD-10-CM | POA: Diagnosis not present

## 2019-02-04 DIAGNOSIS — N2581 Secondary hyperparathyroidism of renal origin: Secondary | ICD-10-CM | POA: Diagnosis not present

## 2019-02-04 DIAGNOSIS — Z992 Dependence on renal dialysis: Secondary | ICD-10-CM | POA: Diagnosis not present

## 2019-02-05 DIAGNOSIS — Z992 Dependence on renal dialysis: Secondary | ICD-10-CM | POA: Diagnosis not present

## 2019-02-05 DIAGNOSIS — D509 Iron deficiency anemia, unspecified: Secondary | ICD-10-CM | POA: Diagnosis not present

## 2019-02-05 DIAGNOSIS — D631 Anemia in chronic kidney disease: Secondary | ICD-10-CM | POA: Diagnosis not present

## 2019-02-05 DIAGNOSIS — N186 End stage renal disease: Secondary | ICD-10-CM | POA: Diagnosis not present

## 2019-02-05 DIAGNOSIS — N2581 Secondary hyperparathyroidism of renal origin: Secondary | ICD-10-CM | POA: Diagnosis not present

## 2019-02-06 DIAGNOSIS — N186 End stage renal disease: Secondary | ICD-10-CM | POA: Diagnosis not present

## 2019-02-06 DIAGNOSIS — D509 Iron deficiency anemia, unspecified: Secondary | ICD-10-CM | POA: Diagnosis not present

## 2019-02-06 DIAGNOSIS — D631 Anemia in chronic kidney disease: Secondary | ICD-10-CM | POA: Diagnosis not present

## 2019-02-06 DIAGNOSIS — N2581 Secondary hyperparathyroidism of renal origin: Secondary | ICD-10-CM | POA: Diagnosis not present

## 2019-02-06 DIAGNOSIS — Z992 Dependence on renal dialysis: Secondary | ICD-10-CM | POA: Diagnosis not present

## 2019-02-07 ENCOUNTER — Encounter (HOSPITAL_COMMUNITY): Payer: Self-pay | Admitting: Hematology

## 2019-02-07 ENCOUNTER — Other Ambulatory Visit: Payer: Self-pay

## 2019-02-07 ENCOUNTER — Ambulatory Visit (HOSPITAL_COMMUNITY): Payer: Medicare Other | Admitting: Hematology

## 2019-02-07 ENCOUNTER — Inpatient Hospital Stay (HOSPITAL_BASED_OUTPATIENT_CLINIC_OR_DEPARTMENT_OTHER): Payer: Medicare Other | Admitting: Hematology

## 2019-02-07 DIAGNOSIS — Z9221 Personal history of antineoplastic chemotherapy: Secondary | ICD-10-CM | POA: Diagnosis not present

## 2019-02-07 DIAGNOSIS — Z9079 Acquired absence of other genital organ(s): Secondary | ICD-10-CM | POA: Diagnosis not present

## 2019-02-07 DIAGNOSIS — D539 Nutritional anemia, unspecified: Secondary | ICD-10-CM | POA: Diagnosis not present

## 2019-02-07 DIAGNOSIS — C649 Malignant neoplasm of unspecified kidney, except renal pelvis: Secondary | ICD-10-CM

## 2019-02-07 DIAGNOSIS — Z992 Dependence on renal dialysis: Secondary | ICD-10-CM | POA: Diagnosis not present

## 2019-02-07 DIAGNOSIS — N2581 Secondary hyperparathyroidism of renal origin: Secondary | ICD-10-CM | POA: Diagnosis not present

## 2019-02-07 DIAGNOSIS — D631 Anemia in chronic kidney disease: Secondary | ICD-10-CM | POA: Diagnosis not present

## 2019-02-07 DIAGNOSIS — N186 End stage renal disease: Secondary | ICD-10-CM | POA: Diagnosis not present

## 2019-02-07 DIAGNOSIS — Z85528 Personal history of other malignant neoplasm of kidney: Secondary | ICD-10-CM | POA: Diagnosis not present

## 2019-02-07 DIAGNOSIS — Z905 Acquired absence of kidney: Secondary | ICD-10-CM | POA: Diagnosis not present

## 2019-02-07 DIAGNOSIS — D509 Iron deficiency anemia, unspecified: Secondary | ICD-10-CM | POA: Diagnosis not present

## 2019-02-07 NOTE — Assessment & Plan Note (Signed)
1.  Macrocytic anemia: - He had bilateral nephrectomies and is currently on peritoneal dialysis at home.  He follows at [facility] in [place]. - He has been upset that his anemia has not been managed properly.  Apparently he is not receiving shots when his hemoglobin is at 11 and 12, which causes it to drop down to 7 and 8. -He complains of feeling very tired as a result of his anemia. -He reportedly had colonoscopy in August 2020 which showed adenomatous polyp in sigmoid colon.  This was done at [facility]. - We checked his stool, 1 of 3 cards was positive for occult blood. -He will receive iron once a month at dialysis clinic. -He reports that he has been receiving Epogen once a week for the last 6 weeks.   -He would like to continue Epogen at dialysis clinic at this time.  We will reevaluate him in 3 months. -He would like to switch his anemia management to our clinic if the dialysis clinic lets his blood drop down to 7-8.  2.  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  Chemotherapy with cisplatin and gemcitabine, 4 cycles from 824 2015-11 03/2013. 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 -CTAP on 01/31/2018 was negative for metastatic disease. -He is reportedly scheduled for scans later this month at [facility] as part of transplant work-up.

## 2019-02-07 NOTE — Progress Notes (Signed)
[address], [place] [address]   CLINIC:  Medical Oncology/Hematology  PCP:  Patient, No Pcp Per No address on file None   REASON FOR VISIT:  Follow-up for anemia  CURRENT THERAPY: Procrit   INTERVAL HISTORY:  Jesus [name] 68 y.o. male Jesus [name] today for follow-up.  He reports overall doing fair.  His main concern is getting a kidney transplant.  He is followed by [facility] transplant team.  He continues with peritoneal dialysis.  He states his nephrologist is giving him weekly Procrit  shots which seems to be helping his hemoglobin.  He is here for repeat labs and office visit.   REVIEW OF SYSTEMS:  Review of Systems  Constitutional: Positive for fatigue.  HENT:  Negative.   Eyes: Negative.   Respiratory: Negative.   Cardiovascular: Negative.   Gastrointestinal: Negative.   Endocrine: Negative.   Genitourinary:        Peritoneal dialysis  Musculoskeletal: Positive for arthralgias, back pain and myalgias.  Skin: Negative.   Neurological: Negative.   Hematological: Negative.   Psychiatric/Behavioral: Negative.      PAST MEDICAL/SURGICAL HISTORY:  Past Medical History:  Diagnosis Date  . Anemia   . Anxiety   . Arthritis   . Cancer [facility])    bladder, ureter, Bil kidneys  . CHF (congestive heart failure) ([place])   . Current moderate episode of major depressive disorder without prior episode ([place]) 06/28/2017  . Dermatitis    scaly bump occurs every few months, pt uses cortizone cream and it goes away  . ESRD (end stage renal disease) on dialysis ([place])    Bilateral Nephrectomies- due to cancer  . GERD (gastroesophageal reflux disease) 04/07/2015  . History of blood transfusion   . Hypertension   . Lumbar back pain   . Mental disorder   . Non compliance w medication regimen    written in error  . Pleural effusion, right    s/p right thoracentesis 10/07/15 (no malignancy by cytology report)  . Shortness of breath dyspnea    Lying down gets  short of breath   Past Surgical History:  Procedure Laterality Date  . AV FISTULA PLACEMENT Right 06/05/2015   Procedure: ARTERIOVENOUS (AV) FISTULA CREATION RIGHT ARM;  Surgeon: Angelia Mould, MD;  Location: [place];  Service: Vascular;  Laterality: Right;  . AV FISTULA PLACEMENT Left 07/25/2017   Procedure: RADIOCEPHALIC  ARTERIOVENOUS FISTULA LEFT ARM;  Surgeon: Conrad [place], MD;  Location: [place];  Service: Vascular;  Laterality: Left;  . BLADDER SURGERY  04-06-2015   removal [facility] bladder/ [facility]   . CAPD INSERTION N/A 06/24/2016   Procedure: LAPAROSCOPIC INSERTION CONTINUOUS AMBULATORY PERITONEAL DIALYSIS  (CAPD) CATHETER;  Surgeon: Clovis Riley, MD;  Location: [place];  Service: General;  Laterality: N/A;  . CARPAL TUNNEL RELEASE     left hand x 2; right hand x 1; right elbow  . CERVICAL FUSION     x 2; 2001 & 2013  . EYE SURGERY Bilateral    Cataract removal  . FISTULA SUPERFICIALIZATION Right 10/30/2015   Procedure: SUPERFICIALIZATION BRACHIOCEPHALIC ARTERIOVENOUS FISTULA Right Arm;  Surgeon: Angelia Mould, MD;  Location: [place];  Service: Vascular;  Laterality: Right;  . Hemocatheter    . IR GENERIC HISTORICAL Right 04/29/2016   IR THROMBECTOMY AV FISTULA W/THROMBOLYSIS/PTA INC/SHUNT/IMG RIGHT 04/29/2016 Arne Cleveland, MD [facility]  . IR GENERIC HISTORICAL  04/29/2016   IR US GUIDE VASC ACCESS RIGHT  04/29/2016 Arne Cleveland, MD [facility]  . IR THORACENTESIS ASP PLEURAL SPACE W/IMG GUIDE  12/06/2016  . KNEE ARTHROSCOPY Bilateral    bilateral; 2013 R; L [phone]  . KNEE ARTHROSCOPY WITH MEDIAL MENISECTOMY Left 02/26/2013   Procedure: KNEE ARTHROSCOPY WITH MEDIAL MENISECTOMY;  Surgeon: Garald Balding, MD;  Location: [place];  Service: Orthopedics;  Laterality: Left;  . LIGATION OF COMPETING BRANCHES OF ARTERIOVENOUS FISTULA Right 10/30/2015   Procedure: LIGATION OF COMPETING BRANCHES OF BRACHIOCEPHALIC ARTERIOVENOUS FISTULA;  Surgeon:  Angelia Mould, MD;  Location: [place];  Service: Vascular;  Laterality: Right;  . NEPHRECTOMY Right September 12, 2012  . NEPHRECTOMY Left 04-06-2015   [place] Right 09/07/2015   Procedure: Fistulagram;  Surgeon: Angelia Mould, MD;  Location: [place] CV LAB;  Service: Cardiovascular;  Laterality: Right;  ARM  . PERIPHERAL VASCULAR CATHETERIZATION Right 09/07/2015   Procedure: Peripheral Vascular Balloon Angioplasty;  Surgeon: Angelia Mould, MD;  Location: [place] CV [facility];  Service: Cardiovascular;  Laterality: Right;  upper arm venous  . PROSTATECTOMY  04-06-2015   [place] Bilateral    bilateral; 2005 L  . TOTAL KNEE ARTHROPLASTY  01/24/2012   Procedure: TOTAL KNEE ARTHROPLASTY;  Surgeon: Garald Balding, MD;  Location: [place];  Service: Orthopedics;  Laterality: Right;  RIGHT TOTAL KNEE REPLACEMENT     SOCIAL HISTORY:  Social History   Socioeconomic History  . Marital status: Widowed    Spouse name: Not on file  . Number of children: 1  . Years of education: Not on file  . Highest education level: Some college, no degree  Occupational History  . Occupation: retired  Tobacco Use  . Smoking status: Never Smoker  . Smokeless tobacco: Never Used  Substance and Sexual Activity  . Alcohol use: Not Currently    Alcohol/week: 0.0 standard drinks    Comment: heavy drinker in the past  . Drug use: Never  . Sexual activity: Not on file  Other Topics Concern  . Not on file  Social History Narrative   Lives alone. Manages well per patient report. Does not cook much. Eats out a lot. Eats all food groups. Does not eat much fruit. Married in the past. Has one son. Used to drive a truck.    Social Determinants of Health   Financial Resource Strain:   . Difficulty of Paying Living Expenses: Not on file  Food Insecurity:   . Worried About Charity fundraiser in  the Last Year: Not on file  . Ran Out of Food in the Last Year: Not on file  Transportation Needs:   . Lack of Transportation (Medical): Not on file  . Lack of Transportation (Non-Medical): Not on file  Physical Activity:   . Days of Exercise per Week: Not on file  . Minutes of Exercise per Session: Not on file  Stress:   . Feeling of Stress : Not on file  Social Connections:   . Frequency of Communication with Friends and Family: Not on file  . Frequency of Social Gatherings with Friends and Family: Not on file  . Attends Religious Services: Not on file  . Active Member of Clubs or Organizations: Not on file  . Attends Archivist Meetings: Not on file  . Marital Status: Not on file  Intimate Partner Violence:   . Fear of Current or Ex-Partner: Not on file  .  Emotionally Abused: Not on file  . Physically Abused: Not on file  . Sexually Abused: Not on file    FAMILY HISTORY:  Family History  Problem Relation Age of Onset  . Cancer Mother   . Hypertension Father   . Atrial fibrillation Father   . Heart disease Father   . Other Father        pacemaker    CURRENT MEDICATIONS:  Outpatient Encounter Medications as of 02/07/2019  Medication Sig Note  . ALPRAZolam (XANAX) 1 MG tablet Take 1 mg by mouth daily as needed for anxiety or sleep.   Marland Kitchen amLODipine (NORVASC) 5 MG tablet Take 1 tablet (5 mg total) by mouth 2 (two) times daily.   . clobetasol cream (TEMOVATE) 1.95 % Apply 1 application topically 2 (two) times daily as needed (skin irritation).    Marland Kitchen docusate sodium (COLACE) 250 MG capsule Take 500 mg by mouth daily.   . fluocinonide (LIDEX) 0.05 % external solution APPLY SOLUTION TOPICALLY TO AFFECTED AREA ONCE DAILY   . fluticasone (FLONASE) 50 MCG/ACT nasal spray Place 1 spray into both nostrils daily.   Marland Kitchen lanthanum (FOSRENOL) 500 MG chewable tablet Chew 500 mg by mouth 3 (three) times daily with meals.   Marland Kitchen levothyroxine (SYNTHROID, LEVOTHROID) 75 MCG tablet Take 1  tablet by mouth as directed. 1 hour prior to other medications   . mupirocin ointment (BACTROBAN) 2 % Apply 1 application topically as directed. 1-2 times a day to any wounds   . Naproxen Sodium (ALEVE PO) Take by mouth as needed.   Marland Kitchen oxyCODONE-acetaminophen (PERCOCET/ROXICET) 5-325 MG tablet Take 1 tablet by mouth 2 (two) times daily as needed.   [name] Glycol-Propyl Glycol (SYSTANE OP) Place 1 drop into both eyes daily as needed (for dry, itchy eyes). 09/14/2017: Rarely uses  . triamcinolone cream (KENALOG) 0.1 % Apply 1 application topically 2 (two) times daily. As directed. Do not apply to face or skin folds    No facility-administered encounter medications on file as of 02/07/2019.    ALLERGIES:  No Known Allergies   PHYSICAL EXAM:  ECOG Performance status: 1  Vitals:   02/07/19 1433  BP: (!) 151/91  Pulse: 76  Resp: 14  Temp: (!) 97.3 F (36.3 C)  SpO2: 100%   Filed Weights   02/07/19 1433  Weight: 251 lb (113.9 kg)    Physical Exam Constitutional:      Appearance: Normal appearance.  HENT:     Head: Normocephalic.     Right Ear: External ear normal.     Left Ear: External ear normal.  Eyes:     Conjunctiva/sclera: Conjunctivae normal.  Cardiovascular:     Rate and Rhythm: Normal rate and regular rhythm.     Pulses: Normal pulses.  Pulmonary:     Effort: Pulmonary effort is normal.     Breath sounds: Normal breath sounds.  Abdominal:     General: Bowel sounds are normal.  Musculoskeletal:     Comments: Decreased range of motion  Skin:    General: Skin is warm.  Neurological:     General: No focal deficit present.     Mental Status: He is alert and oriented to person, place, and time.  Psychiatric:        Mood and Affect: Mood normal.        Behavior: Behavior normal.      LABORATORY DATA:  I have reviewed the labs as listed.  CBC    Component Value Date/Time  WBC 11.4 (H) 01/31/2019 1211   RBC 3.19 (L) 01/31/2019 1211   HGB 11.1 (L)  01/31/2019 1211   HCT 33.8 (L) 01/31/2019 1211   PLT 287 01/31/2019 1211   MCV 106.0 (H) 01/31/2019 1211   MCH 34.8 (H) 01/31/2019 1211   MCHC 32.8 01/31/2019 1211   RDW 13.2 01/31/2019 1211   LYMPHSABS 0.9 01/31/2019 1211   MONOABS 1.4 (H) 01/31/2019 1211   EOSABS 0.5 01/31/2019 1211   BASOSABS 0.1 01/31/2019 1211   CMP Latest Ref Rng & Units 11/22/2018 01/31/2018 08/07/2017  Glucose 70 - 99 mg/dL 125(H) 89 103(H)  BUN 8 - 23 mg/dL 50(H) 64(H) 84(H)  Creatinine 0.61 - 1.24 mg/dL 18.80(H) 18.87(H) 17.42(H)  Sodium 135 - 145 mmol/L 135 133(L) 132(L)  Potassium 3.5 - 5.1 mmol/L 4.2 5.7(H) 5.5(H)  Chloride 98 - 111 mmol/L 89(L) 89(L) 88(L)  CO2 22 - 32 mmol/L 29 24 26   Calcium 8.9 - 10.3 mg/dL 9.0 9.2 9.7  Total Protein 6.5 - 8.1 g/dL 6.4(L) 6.8 6.9  Total Bilirubin 0.3 - 1.2 mg/dL 0.7 0.4 1.0  Alkaline Phos 38 - 126 U/L 88 82 100  AST 15 - 41 U/L 15 10(L) 16  ALT 0 - 44 U/L 17 14 11(L)           ASSESSMENT & PLAN:   Renal cell carcinoma (HCC) 1.  Macrocytic anemia: - He had bilateral nephrectomies and is currently on peritoneal dialysis at home.  He follows at [facility] in [place]. - He has been upset that his anemia has not been managed properly.  Apparently he is not receiving shots when his hemoglobin is at 11 and 12, which causes it to drop down to 7 and 8. -He complains of feeling very tired as a result of his anemia. -He reportedly had colonoscopy in August 2020 which showed adenomatous polyp in sigmoid colon.  This was done at [facility]. - We checked his stool, 1 of 3 cards was positive for occult blood. -He will receive iron once a month at dialysis clinic. -He reports that he has been receiving Epogen once a week for the last 6 weeks.   -He would like to continue Epogen at dialysis clinic at this time.  We will reevaluate him in 3 months. -He would like to switch his anemia management to our clinic if the dialysis clinic lets his blood drop down to 7-8.  2.  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  Chemotherapy with cisplatin and gemcitabine, 4 cycles from 824 2015-11 03/2013. 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 -CTAP on 01/31/2018 was negative for metastatic disease. -He is reportedly scheduled for scans later this month at [facility] as part of transplant work-up.           [place] [phone]

## 2019-02-07 NOTE — Patient Instructions (Signed)
[facility]  Discharge Instructions:  You saw Renee Nester, NP, today. _______________________________________________________________  Thank you for choosing [facility] at [facility] to provide your oncology and hematology care.  To afford each patient quality time with our providers, please arrive at least 15 minutes before your scheduled appointment.  You need to re-schedule your appointment if you arrive 10 or more minutes late.  We strive to give you quality time with our providers, and arriving late affects you and other patients whose appointments are after yours.  Also, if you no show three or more times for appointments you may be dismissed from the clinic.  Again, thank you for choosing [facility] at [facility]. Our hope is that these requests will allow you access to exceptional care and in a timely manner. _______________________________________________________________  If you have questions after your visit, please contact our office at [phone] between the hours of 8:30 a.m. and 5:00 p.m. Voicemails left after 4:30 p.m. will not be returned until the following business day. _______________________________________________________________  For prescription refill requests, have your pharmacy contact our office. _______________________________________________________________  Recommendations made by the consultant and any test results will be sent to your referring physician. _______________________________________________________________ 

## 2019-02-08 DIAGNOSIS — N2581 Secondary hyperparathyroidism of renal origin: Secondary | ICD-10-CM | POA: Diagnosis not present

## 2019-02-08 DIAGNOSIS — D631 Anemia in chronic kidney disease: Secondary | ICD-10-CM | POA: Diagnosis not present

## 2019-02-08 DIAGNOSIS — Z992 Dependence on renal dialysis: Secondary | ICD-10-CM | POA: Diagnosis not present

## 2019-02-08 DIAGNOSIS — N186 End stage renal disease: Secondary | ICD-10-CM | POA: Diagnosis not present

## 2019-02-08 DIAGNOSIS — D509 Iron deficiency anemia, unspecified: Secondary | ICD-10-CM | POA: Diagnosis not present

## 2019-02-09 DIAGNOSIS — N2581 Secondary hyperparathyroidism of renal origin: Secondary | ICD-10-CM | POA: Diagnosis not present

## 2019-02-09 DIAGNOSIS — N186 End stage renal disease: Secondary | ICD-10-CM | POA: Diagnosis not present

## 2019-02-09 DIAGNOSIS — D631 Anemia in chronic kidney disease: Secondary | ICD-10-CM | POA: Diagnosis not present

## 2019-02-09 DIAGNOSIS — Z992 Dependence on renal dialysis: Secondary | ICD-10-CM | POA: Diagnosis not present

## 2019-02-09 DIAGNOSIS — D509 Iron deficiency anemia, unspecified: Secondary | ICD-10-CM | POA: Diagnosis not present

## 2019-02-10 DIAGNOSIS — D509 Iron deficiency anemia, unspecified: Secondary | ICD-10-CM | POA: Diagnosis not present

## 2019-02-10 DIAGNOSIS — Z992 Dependence on renal dialysis: Secondary | ICD-10-CM | POA: Diagnosis not present

## 2019-02-10 DIAGNOSIS — N186 End stage renal disease: Secondary | ICD-10-CM | POA: Diagnosis not present

## 2019-02-10 DIAGNOSIS — D631 Anemia in chronic kidney disease: Secondary | ICD-10-CM | POA: Diagnosis not present

## 2019-02-10 DIAGNOSIS — N2581 Secondary hyperparathyroidism of renal origin: Secondary | ICD-10-CM | POA: Diagnosis not present

## 2019-02-11 DIAGNOSIS — Z992 Dependence on renal dialysis: Secondary | ICD-10-CM | POA: Diagnosis not present

## 2019-02-11 DIAGNOSIS — D631 Anemia in chronic kidney disease: Secondary | ICD-10-CM | POA: Diagnosis not present

## 2019-02-11 DIAGNOSIS — D509 Iron deficiency anemia, unspecified: Secondary | ICD-10-CM | POA: Diagnosis not present

## 2019-02-11 DIAGNOSIS — N2581 Secondary hyperparathyroidism of renal origin: Secondary | ICD-10-CM | POA: Diagnosis not present

## 2019-02-11 DIAGNOSIS — N186 End stage renal disease: Secondary | ICD-10-CM | POA: Diagnosis not present

## 2019-02-12 DIAGNOSIS — D509 Iron deficiency anemia, unspecified: Secondary | ICD-10-CM | POA: Diagnosis not present

## 2019-02-12 DIAGNOSIS — Z992 Dependence on renal dialysis: Secondary | ICD-10-CM | POA: Diagnosis not present

## 2019-02-12 DIAGNOSIS — N2581 Secondary hyperparathyroidism of renal origin: Secondary | ICD-10-CM | POA: Diagnosis not present

## 2019-02-12 DIAGNOSIS — D631 Anemia in chronic kidney disease: Secondary | ICD-10-CM | POA: Diagnosis not present

## 2019-02-12 DIAGNOSIS — N186 End stage renal disease: Secondary | ICD-10-CM | POA: Diagnosis not present

## 2019-02-13 DIAGNOSIS — N186 End stage renal disease: Secondary | ICD-10-CM | POA: Diagnosis not present

## 2019-02-13 DIAGNOSIS — D631 Anemia in chronic kidney disease: Secondary | ICD-10-CM | POA: Diagnosis not present

## 2019-02-13 DIAGNOSIS — D509 Iron deficiency anemia, unspecified: Secondary | ICD-10-CM | POA: Diagnosis not present

## 2019-02-13 DIAGNOSIS — N2581 Secondary hyperparathyroidism of renal origin: Secondary | ICD-10-CM | POA: Diagnosis not present

## 2019-02-13 DIAGNOSIS — Z992 Dependence on renal dialysis: Secondary | ICD-10-CM | POA: Diagnosis not present

## 2019-02-14 DIAGNOSIS — Z992 Dependence on renal dialysis: Secondary | ICD-10-CM | POA: Diagnosis not present

## 2019-02-14 DIAGNOSIS — N186 End stage renal disease: Secondary | ICD-10-CM | POA: Diagnosis not present

## 2019-02-14 DIAGNOSIS — D509 Iron deficiency anemia, unspecified: Secondary | ICD-10-CM | POA: Diagnosis not present

## 2019-02-14 DIAGNOSIS — D631 Anemia in chronic kidney disease: Secondary | ICD-10-CM | POA: Diagnosis not present

## 2019-02-14 DIAGNOSIS — N2581 Secondary hyperparathyroidism of renal origin: Secondary | ICD-10-CM | POA: Diagnosis not present

## 2019-02-15 DIAGNOSIS — N186 End stage renal disease: Secondary | ICD-10-CM | POA: Diagnosis not present

## 2019-02-15 DIAGNOSIS — N2581 Secondary hyperparathyroidism of renal origin: Secondary | ICD-10-CM | POA: Diagnosis not present

## 2019-02-15 DIAGNOSIS — Z992 Dependence on renal dialysis: Secondary | ICD-10-CM | POA: Diagnosis not present

## 2019-02-15 DIAGNOSIS — D631 Anemia in chronic kidney disease: Secondary | ICD-10-CM | POA: Diagnosis not present

## 2019-02-15 DIAGNOSIS — D509 Iron deficiency anemia, unspecified: Secondary | ICD-10-CM | POA: Diagnosis not present

## 2019-02-16 DIAGNOSIS — N186 End stage renal disease: Secondary | ICD-10-CM | POA: Diagnosis not present

## 2019-02-16 DIAGNOSIS — N2581 Secondary hyperparathyroidism of renal origin: Secondary | ICD-10-CM | POA: Diagnosis not present

## 2019-02-16 DIAGNOSIS — Z992 Dependence on renal dialysis: Secondary | ICD-10-CM | POA: Diagnosis not present

## 2019-02-16 DIAGNOSIS — D631 Anemia in chronic kidney disease: Secondary | ICD-10-CM | POA: Diagnosis not present

## 2019-02-16 DIAGNOSIS — D509 Iron deficiency anemia, unspecified: Secondary | ICD-10-CM | POA: Diagnosis not present

## 2019-02-17 DIAGNOSIS — D509 Iron deficiency anemia, unspecified: Secondary | ICD-10-CM | POA: Diagnosis not present

## 2019-02-17 DIAGNOSIS — D631 Anemia in chronic kidney disease: Secondary | ICD-10-CM | POA: Diagnosis not present

## 2019-02-17 DIAGNOSIS — N186 End stage renal disease: Secondary | ICD-10-CM | POA: Diagnosis not present

## 2019-02-17 DIAGNOSIS — N2581 Secondary hyperparathyroidism of renal origin: Secondary | ICD-10-CM | POA: Diagnosis not present

## 2019-02-17 DIAGNOSIS — Z992 Dependence on renal dialysis: Secondary | ICD-10-CM | POA: Diagnosis not present

## 2019-02-18 DIAGNOSIS — D509 Iron deficiency anemia, unspecified: Secondary | ICD-10-CM | POA: Diagnosis not present

## 2019-02-18 DIAGNOSIS — Z992 Dependence on renal dialysis: Secondary | ICD-10-CM | POA: Diagnosis not present

## 2019-02-18 DIAGNOSIS — N186 End stage renal disease: Secondary | ICD-10-CM | POA: Diagnosis not present

## 2019-02-18 DIAGNOSIS — D631 Anemia in chronic kidney disease: Secondary | ICD-10-CM | POA: Diagnosis not present

## 2019-02-18 DIAGNOSIS — N2581 Secondary hyperparathyroidism of renal origin: Secondary | ICD-10-CM | POA: Diagnosis not present

## 2019-02-19 DIAGNOSIS — Z992 Dependence on renal dialysis: Secondary | ICD-10-CM | POA: Diagnosis not present

## 2019-02-19 DIAGNOSIS — N2581 Secondary hyperparathyroidism of renal origin: Secondary | ICD-10-CM | POA: Diagnosis not present

## 2019-02-19 DIAGNOSIS — D509 Iron deficiency anemia, unspecified: Secondary | ICD-10-CM | POA: Diagnosis not present

## 2019-02-19 DIAGNOSIS — D631 Anemia in chronic kidney disease: Secondary | ICD-10-CM | POA: Diagnosis not present

## 2019-02-19 DIAGNOSIS — N186 End stage renal disease: Secondary | ICD-10-CM | POA: Diagnosis not present

## 2019-02-20 DIAGNOSIS — N186 End stage renal disease: Secondary | ICD-10-CM | POA: Diagnosis not present

## 2019-02-20 DIAGNOSIS — D509 Iron deficiency anemia, unspecified: Secondary | ICD-10-CM | POA: Diagnosis not present

## 2019-02-20 DIAGNOSIS — N2581 Secondary hyperparathyroidism of renal origin: Secondary | ICD-10-CM | POA: Diagnosis not present

## 2019-02-20 DIAGNOSIS — D631 Anemia in chronic kidney disease: Secondary | ICD-10-CM | POA: Diagnosis not present

## 2019-02-20 DIAGNOSIS — Z992 Dependence on renal dialysis: Secondary | ICD-10-CM | POA: Diagnosis not present

## 2019-02-21 DIAGNOSIS — N2581 Secondary hyperparathyroidism of renal origin: Secondary | ICD-10-CM | POA: Diagnosis not present

## 2019-02-21 DIAGNOSIS — Z992 Dependence on renal dialysis: Secondary | ICD-10-CM | POA: Diagnosis not present

## 2019-02-21 DIAGNOSIS — M19012 Primary osteoarthritis, left shoulder: Secondary | ICD-10-CM | POA: Diagnosis not present

## 2019-02-21 DIAGNOSIS — D631 Anemia in chronic kidney disease: Secondary | ICD-10-CM | POA: Diagnosis not present

## 2019-02-21 DIAGNOSIS — D509 Iron deficiency anemia, unspecified: Secondary | ICD-10-CM | POA: Diagnosis not present

## 2019-02-21 DIAGNOSIS — N186 End stage renal disease: Secondary | ICD-10-CM | POA: Diagnosis not present

## 2019-02-22 DIAGNOSIS — N186 End stage renal disease: Secondary | ICD-10-CM | POA: Diagnosis not present

## 2019-02-22 DIAGNOSIS — D509 Iron deficiency anemia, unspecified: Secondary | ICD-10-CM | POA: Diagnosis not present

## 2019-02-22 DIAGNOSIS — Z992 Dependence on renal dialysis: Secondary | ICD-10-CM | POA: Diagnosis not present

## 2019-02-22 DIAGNOSIS — D631 Anemia in chronic kidney disease: Secondary | ICD-10-CM | POA: Diagnosis not present

## 2019-02-22 DIAGNOSIS — N2581 Secondary hyperparathyroidism of renal origin: Secondary | ICD-10-CM | POA: Diagnosis not present

## 2019-02-23 DIAGNOSIS — N2581 Secondary hyperparathyroidism of renal origin: Secondary | ICD-10-CM | POA: Diagnosis not present

## 2019-02-23 DIAGNOSIS — N186 End stage renal disease: Secondary | ICD-10-CM | POA: Diagnosis not present

## 2019-02-23 DIAGNOSIS — D631 Anemia in chronic kidney disease: Secondary | ICD-10-CM | POA: Diagnosis not present

## 2019-02-23 DIAGNOSIS — D509 Iron deficiency anemia, unspecified: Secondary | ICD-10-CM | POA: Diagnosis not present

## 2019-02-23 DIAGNOSIS — Z992 Dependence on renal dialysis: Secondary | ICD-10-CM | POA: Diagnosis not present

## 2019-02-24 DIAGNOSIS — N2581 Secondary hyperparathyroidism of renal origin: Secondary | ICD-10-CM | POA: Diagnosis not present

## 2019-02-24 DIAGNOSIS — N186 End stage renal disease: Secondary | ICD-10-CM | POA: Diagnosis not present

## 2019-02-24 DIAGNOSIS — D631 Anemia in chronic kidney disease: Secondary | ICD-10-CM | POA: Diagnosis not present

## 2019-02-24 DIAGNOSIS — D509 Iron deficiency anemia, unspecified: Secondary | ICD-10-CM | POA: Diagnosis not present

## 2019-02-24 DIAGNOSIS — Z992 Dependence on renal dialysis: Secondary | ICD-10-CM | POA: Diagnosis not present

## 2019-02-25 DIAGNOSIS — Z992 Dependence on renal dialysis: Secondary | ICD-10-CM | POA: Diagnosis not present

## 2019-02-25 DIAGNOSIS — D631 Anemia in chronic kidney disease: Secondary | ICD-10-CM | POA: Diagnosis not present

## 2019-02-25 DIAGNOSIS — D509 Iron deficiency anemia, unspecified: Secondary | ICD-10-CM | POA: Diagnosis not present

## 2019-02-25 DIAGNOSIS — N186 End stage renal disease: Secondary | ICD-10-CM | POA: Diagnosis not present

## 2019-02-25 DIAGNOSIS — N2581 Secondary hyperparathyroidism of renal origin: Secondary | ICD-10-CM | POA: Diagnosis not present

## 2019-02-26 DIAGNOSIS — D631 Anemia in chronic kidney disease: Secondary | ICD-10-CM | POA: Diagnosis not present

## 2019-02-26 DIAGNOSIS — D509 Iron deficiency anemia, unspecified: Secondary | ICD-10-CM | POA: Diagnosis not present

## 2019-02-26 DIAGNOSIS — N186 End stage renal disease: Secondary | ICD-10-CM | POA: Diagnosis not present

## 2019-02-26 DIAGNOSIS — N2581 Secondary hyperparathyroidism of renal origin: Secondary | ICD-10-CM | POA: Diagnosis not present

## 2019-02-26 DIAGNOSIS — Z992 Dependence on renal dialysis: Secondary | ICD-10-CM | POA: Diagnosis not present

## 2019-02-27 ENCOUNTER — Ambulatory Visit: Payer: Medicare Other | Admitting: Orthopaedic Surgery

## 2019-02-27 ENCOUNTER — Other Ambulatory Visit: Payer: Self-pay

## 2019-02-27 DIAGNOSIS — N2581 Secondary hyperparathyroidism of renal origin: Secondary | ICD-10-CM | POA: Diagnosis not present

## 2019-02-27 DIAGNOSIS — Z992 Dependence on renal dialysis: Secondary | ICD-10-CM | POA: Diagnosis not present

## 2019-02-27 DIAGNOSIS — D631 Anemia in chronic kidney disease: Secondary | ICD-10-CM | POA: Diagnosis not present

## 2019-02-27 DIAGNOSIS — N186 End stage renal disease: Secondary | ICD-10-CM | POA: Diagnosis not present

## 2019-02-27 DIAGNOSIS — D509 Iron deficiency anemia, unspecified: Secondary | ICD-10-CM | POA: Diagnosis not present

## 2019-02-28 DIAGNOSIS — D631 Anemia in chronic kidney disease: Secondary | ICD-10-CM | POA: Diagnosis not present

## 2019-02-28 DIAGNOSIS — D509 Iron deficiency anemia, unspecified: Secondary | ICD-10-CM | POA: Diagnosis not present

## 2019-02-28 DIAGNOSIS — N186 End stage renal disease: Secondary | ICD-10-CM | POA: Diagnosis not present

## 2019-02-28 DIAGNOSIS — Z992 Dependence on renal dialysis: Secondary | ICD-10-CM | POA: Diagnosis not present

## 2019-02-28 DIAGNOSIS — N2581 Secondary hyperparathyroidism of renal origin: Secondary | ICD-10-CM | POA: Diagnosis not present

## 2019-03-01 DIAGNOSIS — D631 Anemia in chronic kidney disease: Secondary | ICD-10-CM | POA: Diagnosis not present

## 2019-03-01 DIAGNOSIS — D509 Iron deficiency anemia, unspecified: Secondary | ICD-10-CM | POA: Diagnosis not present

## 2019-03-01 DIAGNOSIS — N186 End stage renal disease: Secondary | ICD-10-CM | POA: Diagnosis not present

## 2019-03-01 DIAGNOSIS — N2581 Secondary hyperparathyroidism of renal origin: Secondary | ICD-10-CM | POA: Diagnosis not present

## 2019-03-01 DIAGNOSIS — Z992 Dependence on renal dialysis: Secondary | ICD-10-CM | POA: Diagnosis not present

## 2019-03-02 DIAGNOSIS — Z992 Dependence on renal dialysis: Secondary | ICD-10-CM | POA: Diagnosis not present

## 2019-03-02 DIAGNOSIS — N2581 Secondary hyperparathyroidism of renal origin: Secondary | ICD-10-CM | POA: Diagnosis not present

## 2019-03-02 DIAGNOSIS — N186 End stage renal disease: Secondary | ICD-10-CM | POA: Diagnosis not present

## 2019-03-02 DIAGNOSIS — D509 Iron deficiency anemia, unspecified: Secondary | ICD-10-CM | POA: Diagnosis not present

## 2019-03-02 DIAGNOSIS — D631 Anemia in chronic kidney disease: Secondary | ICD-10-CM | POA: Diagnosis not present

## 2019-03-03 DIAGNOSIS — D631 Anemia in chronic kidney disease: Secondary | ICD-10-CM | POA: Diagnosis not present

## 2019-03-03 DIAGNOSIS — Z992 Dependence on renal dialysis: Secondary | ICD-10-CM | POA: Diagnosis not present

## 2019-03-03 DIAGNOSIS — N186 End stage renal disease: Secondary | ICD-10-CM | POA: Diagnosis not present

## 2019-03-03 DIAGNOSIS — D509 Iron deficiency anemia, unspecified: Secondary | ICD-10-CM | POA: Diagnosis not present

## 2019-03-03 DIAGNOSIS — N2581 Secondary hyperparathyroidism of renal origin: Secondary | ICD-10-CM | POA: Diagnosis not present

## 2019-03-04 DIAGNOSIS — D631 Anemia in chronic kidney disease: Secondary | ICD-10-CM | POA: Diagnosis not present

## 2019-03-04 DIAGNOSIS — D509 Iron deficiency anemia, unspecified: Secondary | ICD-10-CM | POA: Diagnosis not present

## 2019-03-04 DIAGNOSIS — Z992 Dependence on renal dialysis: Secondary | ICD-10-CM | POA: Diagnosis not present

## 2019-03-04 DIAGNOSIS — N2581 Secondary hyperparathyroidism of renal origin: Secondary | ICD-10-CM | POA: Diagnosis not present

## 2019-03-04 DIAGNOSIS — N186 End stage renal disease: Secondary | ICD-10-CM | POA: Diagnosis not present

## 2019-03-05 DIAGNOSIS — D631 Anemia in chronic kidney disease: Secondary | ICD-10-CM | POA: Diagnosis not present

## 2019-03-05 DIAGNOSIS — N186 End stage renal disease: Secondary | ICD-10-CM | POA: Diagnosis not present

## 2019-03-05 DIAGNOSIS — D509 Iron deficiency anemia, unspecified: Secondary | ICD-10-CM | POA: Diagnosis not present

## 2019-03-05 DIAGNOSIS — Z992 Dependence on renal dialysis: Secondary | ICD-10-CM | POA: Diagnosis not present

## 2019-03-05 DIAGNOSIS — N2581 Secondary hyperparathyroidism of renal origin: Secondary | ICD-10-CM | POA: Diagnosis not present

## 2019-03-06 DIAGNOSIS — D509 Iron deficiency anemia, unspecified: Secondary | ICD-10-CM | POA: Diagnosis not present

## 2019-03-06 DIAGNOSIS — N186 End stage renal disease: Secondary | ICD-10-CM | POA: Diagnosis not present

## 2019-03-06 DIAGNOSIS — N2581 Secondary hyperparathyroidism of renal origin: Secondary | ICD-10-CM | POA: Diagnosis not present

## 2019-03-06 DIAGNOSIS — D631 Anemia in chronic kidney disease: Secondary | ICD-10-CM | POA: Diagnosis not present

## 2019-03-06 DIAGNOSIS — Z992 Dependence on renal dialysis: Secondary | ICD-10-CM | POA: Diagnosis not present

## 2019-03-07 DIAGNOSIS — D631 Anemia in chronic kidney disease: Secondary | ICD-10-CM | POA: Diagnosis not present

## 2019-03-07 DIAGNOSIS — D509 Iron deficiency anemia, unspecified: Secondary | ICD-10-CM | POA: Diagnosis not present

## 2019-03-07 DIAGNOSIS — N2581 Secondary hyperparathyroidism of renal origin: Secondary | ICD-10-CM | POA: Diagnosis not present

## 2019-03-07 DIAGNOSIS — N186 End stage renal disease: Secondary | ICD-10-CM | POA: Diagnosis not present

## 2019-03-07 DIAGNOSIS — I10 Essential (primary) hypertension: Secondary | ICD-10-CM | POA: Diagnosis not present

## 2019-03-07 DIAGNOSIS — Z992 Dependence on renal dialysis: Secondary | ICD-10-CM | POA: Diagnosis not present

## 2019-03-07 DIAGNOSIS — M542 Cervicalgia: Secondary | ICD-10-CM | POA: Diagnosis not present

## 2019-03-07 DIAGNOSIS — R200 Anesthesia of skin: Secondary | ICD-10-CM | POA: Diagnosis not present

## 2019-03-07 DIAGNOSIS — Z6835 Body mass index (BMI) 35.0-35.9, adult: Secondary | ICD-10-CM | POA: Diagnosis not present

## 2019-03-08 DIAGNOSIS — N186 End stage renal disease: Secondary | ICD-10-CM | POA: Diagnosis not present

## 2019-03-08 DIAGNOSIS — N2581 Secondary hyperparathyroidism of renal origin: Secondary | ICD-10-CM | POA: Diagnosis not present

## 2019-03-08 DIAGNOSIS — Z992 Dependence on renal dialysis: Secondary | ICD-10-CM | POA: Diagnosis not present

## 2019-03-08 DIAGNOSIS — D509 Iron deficiency anemia, unspecified: Secondary | ICD-10-CM | POA: Diagnosis not present

## 2019-03-08 DIAGNOSIS — D631 Anemia in chronic kidney disease: Secondary | ICD-10-CM | POA: Diagnosis not present

## 2019-03-09 DIAGNOSIS — D631 Anemia in chronic kidney disease: Secondary | ICD-10-CM | POA: Diagnosis not present

## 2019-03-09 DIAGNOSIS — D509 Iron deficiency anemia, unspecified: Secondary | ICD-10-CM | POA: Diagnosis not present

## 2019-03-09 DIAGNOSIS — N186 End stage renal disease: Secondary | ICD-10-CM | POA: Diagnosis not present

## 2019-03-09 DIAGNOSIS — N2581 Secondary hyperparathyroidism of renal origin: Secondary | ICD-10-CM | POA: Diagnosis not present

## 2019-03-09 DIAGNOSIS — Z992 Dependence on renal dialysis: Secondary | ICD-10-CM | POA: Diagnosis not present

## 2019-03-10 DIAGNOSIS — N186 End stage renal disease: Secondary | ICD-10-CM | POA: Diagnosis not present

## 2019-03-10 DIAGNOSIS — Z992 Dependence on renal dialysis: Secondary | ICD-10-CM | POA: Diagnosis not present

## 2019-03-10 DIAGNOSIS — N2581 Secondary hyperparathyroidism of renal origin: Secondary | ICD-10-CM | POA: Diagnosis not present

## 2019-03-10 DIAGNOSIS — D509 Iron deficiency anemia, unspecified: Secondary | ICD-10-CM | POA: Diagnosis not present

## 2019-03-10 DIAGNOSIS — D631 Anemia in chronic kidney disease: Secondary | ICD-10-CM | POA: Diagnosis not present

## 2019-03-11 DIAGNOSIS — Z992 Dependence on renal dialysis: Secondary | ICD-10-CM | POA: Diagnosis not present

## 2019-03-11 DIAGNOSIS — N2581 Secondary hyperparathyroidism of renal origin: Secondary | ICD-10-CM | POA: Diagnosis not present

## 2019-03-11 DIAGNOSIS — D509 Iron deficiency anemia, unspecified: Secondary | ICD-10-CM | POA: Diagnosis not present

## 2019-03-11 DIAGNOSIS — N186 End stage renal disease: Secondary | ICD-10-CM | POA: Diagnosis not present

## 2019-03-11 DIAGNOSIS — D631 Anemia in chronic kidney disease: Secondary | ICD-10-CM | POA: Diagnosis not present

## 2019-03-12 DIAGNOSIS — Z992 Dependence on renal dialysis: Secondary | ICD-10-CM | POA: Diagnosis not present

## 2019-03-12 DIAGNOSIS — D631 Anemia in chronic kidney disease: Secondary | ICD-10-CM | POA: Diagnosis not present

## 2019-03-12 DIAGNOSIS — N2581 Secondary hyperparathyroidism of renal origin: Secondary | ICD-10-CM | POA: Diagnosis not present

## 2019-03-12 DIAGNOSIS — D509 Iron deficiency anemia, unspecified: Secondary | ICD-10-CM | POA: Diagnosis not present

## 2019-03-12 DIAGNOSIS — N186 End stage renal disease: Secondary | ICD-10-CM | POA: Diagnosis not present

## 2019-03-13 DIAGNOSIS — D631 Anemia in chronic kidney disease: Secondary | ICD-10-CM | POA: Diagnosis not present

## 2019-03-13 DIAGNOSIS — N186 End stage renal disease: Secondary | ICD-10-CM | POA: Diagnosis not present

## 2019-03-13 DIAGNOSIS — Z992 Dependence on renal dialysis: Secondary | ICD-10-CM | POA: Diagnosis not present

## 2019-03-13 DIAGNOSIS — D509 Iron deficiency anemia, unspecified: Secondary | ICD-10-CM | POA: Diagnosis not present

## 2019-03-13 DIAGNOSIS — N2581 Secondary hyperparathyroidism of renal origin: Secondary | ICD-10-CM | POA: Diagnosis not present

## 2019-03-14 DIAGNOSIS — D631 Anemia in chronic kidney disease: Secondary | ICD-10-CM | POA: Diagnosis not present

## 2019-03-14 DIAGNOSIS — D509 Iron deficiency anemia, unspecified: Secondary | ICD-10-CM | POA: Diagnosis not present

## 2019-03-14 DIAGNOSIS — Z992 Dependence on renal dialysis: Secondary | ICD-10-CM | POA: Diagnosis not present

## 2019-03-14 DIAGNOSIS — N2581 Secondary hyperparathyroidism of renal origin: Secondary | ICD-10-CM | POA: Diagnosis not present

## 2019-03-14 DIAGNOSIS — N186 End stage renal disease: Secondary | ICD-10-CM | POA: Diagnosis not present

## 2019-03-15 ENCOUNTER — Other Ambulatory Visit: Payer: Self-pay | Admitting: Cardiovascular Disease

## 2019-03-15 DIAGNOSIS — Z992 Dependence on renal dialysis: Secondary | ICD-10-CM | POA: Diagnosis not present

## 2019-03-15 DIAGNOSIS — D509 Iron deficiency anemia, unspecified: Secondary | ICD-10-CM | POA: Diagnosis not present

## 2019-03-15 DIAGNOSIS — N186 End stage renal disease: Secondary | ICD-10-CM | POA: Diagnosis not present

## 2019-03-15 DIAGNOSIS — D631 Anemia in chronic kidney disease: Secondary | ICD-10-CM | POA: Diagnosis not present

## 2019-03-15 DIAGNOSIS — N2581 Secondary hyperparathyroidism of renal origin: Secondary | ICD-10-CM | POA: Diagnosis not present

## 2019-03-16 DIAGNOSIS — Z992 Dependence on renal dialysis: Secondary | ICD-10-CM | POA: Diagnosis not present

## 2019-03-16 DIAGNOSIS — D631 Anemia in chronic kidney disease: Secondary | ICD-10-CM | POA: Diagnosis not present

## 2019-03-16 DIAGNOSIS — N2581 Secondary hyperparathyroidism of renal origin: Secondary | ICD-10-CM | POA: Diagnosis not present

## 2019-03-16 DIAGNOSIS — D509 Iron deficiency anemia, unspecified: Secondary | ICD-10-CM | POA: Diagnosis not present

## 2019-03-16 DIAGNOSIS — N186 End stage renal disease: Secondary | ICD-10-CM | POA: Diagnosis not present

## 2019-03-17 DIAGNOSIS — D631 Anemia in chronic kidney disease: Secondary | ICD-10-CM | POA: Diagnosis not present

## 2019-03-17 DIAGNOSIS — N186 End stage renal disease: Secondary | ICD-10-CM | POA: Diagnosis not present

## 2019-03-17 DIAGNOSIS — D509 Iron deficiency anemia, unspecified: Secondary | ICD-10-CM | POA: Diagnosis not present

## 2019-03-17 DIAGNOSIS — N2581 Secondary hyperparathyroidism of renal origin: Secondary | ICD-10-CM | POA: Diagnosis not present

## 2019-03-17 DIAGNOSIS — Z992 Dependence on renal dialysis: Secondary | ICD-10-CM | POA: Diagnosis not present

## 2019-03-18 DIAGNOSIS — D631 Anemia in chronic kidney disease: Secondary | ICD-10-CM | POA: Diagnosis not present

## 2019-03-18 DIAGNOSIS — N186 End stage renal disease: Secondary | ICD-10-CM | POA: Diagnosis not present

## 2019-03-18 DIAGNOSIS — N2581 Secondary hyperparathyroidism of renal origin: Secondary | ICD-10-CM | POA: Diagnosis not present

## 2019-03-18 DIAGNOSIS — Z992 Dependence on renal dialysis: Secondary | ICD-10-CM | POA: Diagnosis not present

## 2019-03-18 DIAGNOSIS — D509 Iron deficiency anemia, unspecified: Secondary | ICD-10-CM | POA: Diagnosis not present

## 2019-03-19 DIAGNOSIS — N186 End stage renal disease: Secondary | ICD-10-CM | POA: Diagnosis not present

## 2019-03-19 DIAGNOSIS — D631 Anemia in chronic kidney disease: Secondary | ICD-10-CM | POA: Diagnosis not present

## 2019-03-19 DIAGNOSIS — N2581 Secondary hyperparathyroidism of renal origin: Secondary | ICD-10-CM | POA: Diagnosis not present

## 2019-03-19 DIAGNOSIS — Z992 Dependence on renal dialysis: Secondary | ICD-10-CM | POA: Diagnosis not present

## 2019-03-19 DIAGNOSIS — D509 Iron deficiency anemia, unspecified: Secondary | ICD-10-CM | POA: Diagnosis not present

## 2019-03-20 DIAGNOSIS — D631 Anemia in chronic kidney disease: Secondary | ICD-10-CM | POA: Diagnosis not present

## 2019-03-20 DIAGNOSIS — N2581 Secondary hyperparathyroidism of renal origin: Secondary | ICD-10-CM | POA: Diagnosis not present

## 2019-03-20 DIAGNOSIS — D509 Iron deficiency anemia, unspecified: Secondary | ICD-10-CM | POA: Diagnosis not present

## 2019-03-20 DIAGNOSIS — Z992 Dependence on renal dialysis: Secondary | ICD-10-CM | POA: Diagnosis not present

## 2019-03-20 DIAGNOSIS — N186 End stage renal disease: Secondary | ICD-10-CM | POA: Diagnosis not present

## 2019-03-21 DIAGNOSIS — Z6836 Body mass index (BMI) 36.0-36.9, adult: Secondary | ICD-10-CM | POA: Diagnosis not present

## 2019-03-21 DIAGNOSIS — Z992 Dependence on renal dialysis: Secondary | ICD-10-CM | POA: Diagnosis not present

## 2019-03-21 DIAGNOSIS — D631 Anemia in chronic kidney disease: Secondary | ICD-10-CM | POA: Diagnosis not present

## 2019-03-21 DIAGNOSIS — D509 Iron deficiency anemia, unspecified: Secondary | ICD-10-CM | POA: Diagnosis not present

## 2019-03-21 DIAGNOSIS — M5481 Occipital neuralgia: Secondary | ICD-10-CM | POA: Diagnosis not present

## 2019-03-21 DIAGNOSIS — M47812 Spondylosis without myelopathy or radiculopathy, cervical region: Secondary | ICD-10-CM | POA: Diagnosis not present

## 2019-03-21 DIAGNOSIS — M50221 Other cervical disc displacement at C4-C5 level: Secondary | ICD-10-CM | POA: Diagnosis not present

## 2019-03-21 DIAGNOSIS — M50222 Other cervical disc displacement at C5-C6 level: Secondary | ICD-10-CM | POA: Diagnosis not present

## 2019-03-21 DIAGNOSIS — I10 Essential (primary) hypertension: Secondary | ICD-10-CM | POA: Diagnosis not present

## 2019-03-21 DIAGNOSIS — N186 End stage renal disease: Secondary | ICD-10-CM | POA: Diagnosis not present

## 2019-03-21 DIAGNOSIS — N2581 Secondary hyperparathyroidism of renal origin: Secondary | ICD-10-CM | POA: Diagnosis not present

## 2019-03-21 DIAGNOSIS — M542 Cervicalgia: Secondary | ICD-10-CM | POA: Diagnosis not present

## 2019-03-21 DIAGNOSIS — R519 Headache, unspecified: Secondary | ICD-10-CM | POA: Diagnosis not present

## 2019-03-21 DIAGNOSIS — R202 Paresthesia of skin: Secondary | ICD-10-CM | POA: Diagnosis not present

## 2019-03-21 DIAGNOSIS — R200 Anesthesia of skin: Secondary | ICD-10-CM | POA: Diagnosis not present

## 2019-03-22 ENCOUNTER — Other Ambulatory Visit: Payer: Self-pay | Admitting: Neurological Surgery

## 2019-03-22 DIAGNOSIS — D509 Iron deficiency anemia, unspecified: Secondary | ICD-10-CM | POA: Diagnosis not present

## 2019-03-22 DIAGNOSIS — N2581 Secondary hyperparathyroidism of renal origin: Secondary | ICD-10-CM | POA: Diagnosis not present

## 2019-03-22 DIAGNOSIS — Z992 Dependence on renal dialysis: Secondary | ICD-10-CM | POA: Diagnosis not present

## 2019-03-22 DIAGNOSIS — D631 Anemia in chronic kidney disease: Secondary | ICD-10-CM | POA: Diagnosis not present

## 2019-03-22 DIAGNOSIS — M5481 Occipital neuralgia: Secondary | ICD-10-CM

## 2019-03-22 DIAGNOSIS — N186 End stage renal disease: Secondary | ICD-10-CM | POA: Diagnosis not present

## 2019-03-23 DIAGNOSIS — N186 End stage renal disease: Secondary | ICD-10-CM | POA: Diagnosis not present

## 2019-03-23 DIAGNOSIS — Z992 Dependence on renal dialysis: Secondary | ICD-10-CM | POA: Diagnosis not present

## 2019-03-23 DIAGNOSIS — D509 Iron deficiency anemia, unspecified: Secondary | ICD-10-CM | POA: Diagnosis not present

## 2019-03-23 DIAGNOSIS — D631 Anemia in chronic kidney disease: Secondary | ICD-10-CM | POA: Diagnosis not present

## 2019-03-23 DIAGNOSIS — N2581 Secondary hyperparathyroidism of renal origin: Secondary | ICD-10-CM | POA: Diagnosis not present

## 2019-03-24 DIAGNOSIS — N186 End stage renal disease: Secondary | ICD-10-CM | POA: Diagnosis not present

## 2019-03-24 DIAGNOSIS — D631 Anemia in chronic kidney disease: Secondary | ICD-10-CM | POA: Diagnosis not present

## 2019-03-24 DIAGNOSIS — D509 Iron deficiency anemia, unspecified: Secondary | ICD-10-CM | POA: Diagnosis not present

## 2019-03-24 DIAGNOSIS — N2581 Secondary hyperparathyroidism of renal origin: Secondary | ICD-10-CM | POA: Diagnosis not present

## 2019-03-24 DIAGNOSIS — Z992 Dependence on renal dialysis: Secondary | ICD-10-CM | POA: Diagnosis not present

## 2019-03-25 DIAGNOSIS — M79672 Pain in left foot: Secondary | ICD-10-CM | POA: Diagnosis not present

## 2019-03-25 DIAGNOSIS — D631 Anemia in chronic kidney disease: Secondary | ICD-10-CM | POA: Diagnosis not present

## 2019-03-25 DIAGNOSIS — D509 Iron deficiency anemia, unspecified: Secondary | ICD-10-CM | POA: Diagnosis not present

## 2019-03-25 DIAGNOSIS — M79671 Pain in right foot: Secondary | ICD-10-CM | POA: Diagnosis not present

## 2019-03-25 DIAGNOSIS — N186 End stage renal disease: Secondary | ICD-10-CM | POA: Diagnosis not present

## 2019-03-25 DIAGNOSIS — L110 Acquired keratosis follicularis: Secondary | ICD-10-CM | POA: Diagnosis not present

## 2019-03-25 DIAGNOSIS — N2581 Secondary hyperparathyroidism of renal origin: Secondary | ICD-10-CM | POA: Diagnosis not present

## 2019-03-25 DIAGNOSIS — I739 Peripheral vascular disease, unspecified: Secondary | ICD-10-CM | POA: Diagnosis not present

## 2019-03-25 DIAGNOSIS — Z992 Dependence on renal dialysis: Secondary | ICD-10-CM | POA: Diagnosis not present

## 2019-03-26 DIAGNOSIS — Z992 Dependence on renal dialysis: Secondary | ICD-10-CM | POA: Diagnosis not present

## 2019-03-26 DIAGNOSIS — D631 Anemia in chronic kidney disease: Secondary | ICD-10-CM | POA: Diagnosis not present

## 2019-03-26 DIAGNOSIS — N186 End stage renal disease: Secondary | ICD-10-CM | POA: Diagnosis not present

## 2019-03-26 DIAGNOSIS — N2581 Secondary hyperparathyroidism of renal origin: Secondary | ICD-10-CM | POA: Diagnosis not present

## 2019-03-26 DIAGNOSIS — D509 Iron deficiency anemia, unspecified: Secondary | ICD-10-CM | POA: Diagnosis not present

## 2019-03-27 DIAGNOSIS — Z992 Dependence on renal dialysis: Secondary | ICD-10-CM | POA: Diagnosis not present

## 2019-03-27 DIAGNOSIS — D631 Anemia in chronic kidney disease: Secondary | ICD-10-CM | POA: Diagnosis not present

## 2019-03-27 DIAGNOSIS — N2581 Secondary hyperparathyroidism of renal origin: Secondary | ICD-10-CM | POA: Diagnosis not present

## 2019-03-27 DIAGNOSIS — N186 End stage renal disease: Secondary | ICD-10-CM | POA: Diagnosis not present

## 2019-03-27 DIAGNOSIS — D509 Iron deficiency anemia, unspecified: Secondary | ICD-10-CM | POA: Diagnosis not present

## 2019-03-28 DIAGNOSIS — D631 Anemia in chronic kidney disease: Secondary | ICD-10-CM | POA: Diagnosis not present

## 2019-03-28 DIAGNOSIS — D509 Iron deficiency anemia, unspecified: Secondary | ICD-10-CM | POA: Diagnosis not present

## 2019-03-28 DIAGNOSIS — N186 End stage renal disease: Secondary | ICD-10-CM | POA: Diagnosis not present

## 2019-03-28 DIAGNOSIS — Z992 Dependence on renal dialysis: Secondary | ICD-10-CM | POA: Diagnosis not present

## 2019-03-28 DIAGNOSIS — N2581 Secondary hyperparathyroidism of renal origin: Secondary | ICD-10-CM | POA: Diagnosis not present

## 2019-03-29 DIAGNOSIS — D631 Anemia in chronic kidney disease: Secondary | ICD-10-CM | POA: Diagnosis not present

## 2019-03-29 DIAGNOSIS — D509 Iron deficiency anemia, unspecified: Secondary | ICD-10-CM | POA: Diagnosis not present

## 2019-03-29 DIAGNOSIS — N186 End stage renal disease: Secondary | ICD-10-CM | POA: Diagnosis not present

## 2019-03-29 DIAGNOSIS — Z992 Dependence on renal dialysis: Secondary | ICD-10-CM | POA: Diagnosis not present

## 2019-03-29 DIAGNOSIS — N2581 Secondary hyperparathyroidism of renal origin: Secondary | ICD-10-CM | POA: Diagnosis not present

## 2019-03-30 DIAGNOSIS — D631 Anemia in chronic kidney disease: Secondary | ICD-10-CM | POA: Diagnosis not present

## 2019-03-30 DIAGNOSIS — N2581 Secondary hyperparathyroidism of renal origin: Secondary | ICD-10-CM | POA: Diagnosis not present

## 2019-03-30 DIAGNOSIS — N186 End stage renal disease: Secondary | ICD-10-CM | POA: Diagnosis not present

## 2019-03-30 DIAGNOSIS — D509 Iron deficiency anemia, unspecified: Secondary | ICD-10-CM | POA: Diagnosis not present

## 2019-03-30 DIAGNOSIS — Z992 Dependence on renal dialysis: Secondary | ICD-10-CM | POA: Diagnosis not present

## 2019-03-31 DIAGNOSIS — N186 End stage renal disease: Secondary | ICD-10-CM | POA: Diagnosis not present

## 2019-03-31 DIAGNOSIS — N2581 Secondary hyperparathyroidism of renal origin: Secondary | ICD-10-CM | POA: Diagnosis not present

## 2019-03-31 DIAGNOSIS — D631 Anemia in chronic kidney disease: Secondary | ICD-10-CM | POA: Diagnosis not present

## 2019-03-31 DIAGNOSIS — D509 Iron deficiency anemia, unspecified: Secondary | ICD-10-CM | POA: Diagnosis not present

## 2019-03-31 DIAGNOSIS — Z992 Dependence on renal dialysis: Secondary | ICD-10-CM | POA: Diagnosis not present

## 2019-04-01 DIAGNOSIS — D509 Iron deficiency anemia, unspecified: Secondary | ICD-10-CM | POA: Diagnosis not present

## 2019-04-01 DIAGNOSIS — Z992 Dependence on renal dialysis: Secondary | ICD-10-CM | POA: Diagnosis not present

## 2019-04-01 DIAGNOSIS — D631 Anemia in chronic kidney disease: Secondary | ICD-10-CM | POA: Diagnosis not present

## 2019-04-01 DIAGNOSIS — N186 End stage renal disease: Secondary | ICD-10-CM | POA: Diagnosis not present

## 2019-04-02 ENCOUNTER — Other Ambulatory Visit: Payer: Self-pay

## 2019-04-02 ENCOUNTER — Ambulatory Visit
Admission: RE | Admit: 2019-04-02 | Discharge: 2019-04-02 | Disposition: A | Discharge status: Home / Self Care | Payer: Medicare Other | Source: Ambulatory Visit | Attending: Neurological Surgery | Admitting: Neurological Surgery

## 2019-04-02 ENCOUNTER — Other Ambulatory Visit: Payer: Self-pay | Admitting: Neurological Surgery

## 2019-04-02 DIAGNOSIS — N186 End stage renal disease: Secondary | ICD-10-CM | POA: Diagnosis not present

## 2019-04-02 DIAGNOSIS — M5481 Occipital neuralgia: Secondary | ICD-10-CM

## 2019-04-02 DIAGNOSIS — D631 Anemia in chronic kidney disease: Secondary | ICD-10-CM | POA: Diagnosis not present

## 2019-04-02 DIAGNOSIS — D509 Iron deficiency anemia, unspecified: Secondary | ICD-10-CM | POA: Diagnosis not present

## 2019-04-02 DIAGNOSIS — Z992 Dependence on renal dialysis: Secondary | ICD-10-CM | POA: Diagnosis not present

## 2019-04-02 MED ORDER — DEXAMETHASONE SODIUM PHOSPHATE 4 MG/ML IJ SOLN
8.0000 mg | Freq: Once | INTRAMUSCULAR | Status: AC
  Administered 2019-04-02: 8 mg

## 2019-04-03 DIAGNOSIS — N186 End stage renal disease: Secondary | ICD-10-CM | POA: Diagnosis not present

## 2019-04-03 DIAGNOSIS — Z992 Dependence on renal dialysis: Secondary | ICD-10-CM | POA: Diagnosis not present

## 2019-04-03 DIAGNOSIS — D509 Iron deficiency anemia, unspecified: Secondary | ICD-10-CM | POA: Diagnosis not present

## 2019-04-03 DIAGNOSIS — D631 Anemia in chronic kidney disease: Secondary | ICD-10-CM | POA: Diagnosis not present

## 2019-04-04 DIAGNOSIS — N186 End stage renal disease: Secondary | ICD-10-CM | POA: Diagnosis not present

## 2019-04-04 DIAGNOSIS — Z992 Dependence on renal dialysis: Secondary | ICD-10-CM | POA: Diagnosis not present

## 2019-04-04 DIAGNOSIS — D631 Anemia in chronic kidney disease: Secondary | ICD-10-CM | POA: Diagnosis not present

## 2019-04-04 DIAGNOSIS — D509 Iron deficiency anemia, unspecified: Secondary | ICD-10-CM | POA: Diagnosis not present

## 2019-04-05 DIAGNOSIS — D509 Iron deficiency anemia, unspecified: Secondary | ICD-10-CM | POA: Diagnosis not present

## 2019-04-05 DIAGNOSIS — N186 End stage renal disease: Secondary | ICD-10-CM | POA: Diagnosis not present

## 2019-04-05 DIAGNOSIS — Z992 Dependence on renal dialysis: Secondary | ICD-10-CM | POA: Diagnosis not present

## 2019-04-05 DIAGNOSIS — D631 Anemia in chronic kidney disease: Secondary | ICD-10-CM | POA: Diagnosis not present

## 2019-04-06 DIAGNOSIS — D509 Iron deficiency anemia, unspecified: Secondary | ICD-10-CM | POA: Diagnosis not present

## 2019-04-06 DIAGNOSIS — N186 End stage renal disease: Secondary | ICD-10-CM | POA: Diagnosis not present

## 2019-04-06 DIAGNOSIS — D631 Anemia in chronic kidney disease: Secondary | ICD-10-CM | POA: Diagnosis not present

## 2019-04-06 DIAGNOSIS — Z992 Dependence on renal dialysis: Secondary | ICD-10-CM | POA: Diagnosis not present

## 2019-04-07 DIAGNOSIS — Z992 Dependence on renal dialysis: Secondary | ICD-10-CM | POA: Diagnosis not present

## 2019-04-07 DIAGNOSIS — D631 Anemia in chronic kidney disease: Secondary | ICD-10-CM | POA: Diagnosis not present

## 2019-04-07 DIAGNOSIS — N186 End stage renal disease: Secondary | ICD-10-CM | POA: Diagnosis not present

## 2019-04-07 DIAGNOSIS — D509 Iron deficiency anemia, unspecified: Secondary | ICD-10-CM | POA: Diagnosis not present

## 2019-04-08 DIAGNOSIS — D631 Anemia in chronic kidney disease: Secondary | ICD-10-CM | POA: Diagnosis not present

## 2019-04-08 DIAGNOSIS — Z992 Dependence on renal dialysis: Secondary | ICD-10-CM | POA: Diagnosis not present

## 2019-04-08 DIAGNOSIS — N186 End stage renal disease: Secondary | ICD-10-CM | POA: Diagnosis not present

## 2019-04-08 DIAGNOSIS — D509 Iron deficiency anemia, unspecified: Secondary | ICD-10-CM | POA: Diagnosis not present

## 2019-04-09 DIAGNOSIS — H838X3 Other specified diseases of inner ear, bilateral: Secondary | ICD-10-CM | POA: Diagnosis not present

## 2019-04-09 DIAGNOSIS — H903 Sensorineural hearing loss, bilateral: Secondary | ICD-10-CM | POA: Diagnosis not present

## 2019-04-09 DIAGNOSIS — D509 Iron deficiency anemia, unspecified: Secondary | ICD-10-CM | POA: Diagnosis not present

## 2019-04-09 DIAGNOSIS — Z992 Dependence on renal dialysis: Secondary | ICD-10-CM | POA: Diagnosis not present

## 2019-04-09 DIAGNOSIS — G501 Atypical facial pain: Secondary | ICD-10-CM | POA: Diagnosis not present

## 2019-04-09 DIAGNOSIS — N186 End stage renal disease: Secondary | ICD-10-CM | POA: Diagnosis not present

## 2019-04-09 DIAGNOSIS — H9313 Tinnitus, bilateral: Secondary | ICD-10-CM | POA: Diagnosis not present

## 2019-04-09 DIAGNOSIS — D631 Anemia in chronic kidney disease: Secondary | ICD-10-CM | POA: Diagnosis not present

## 2019-04-10 ENCOUNTER — Other Ambulatory Visit: Payer: Self-pay

## 2019-04-10 ENCOUNTER — Ambulatory Visit: Payer: Medicare Other | Admitting: Orthopaedic Surgery

## 2019-04-10 DIAGNOSIS — D631 Anemia in chronic kidney disease: Secondary | ICD-10-CM | POA: Diagnosis not present

## 2019-04-10 DIAGNOSIS — N186 End stage renal disease: Secondary | ICD-10-CM | POA: Diagnosis not present

## 2019-04-10 DIAGNOSIS — D509 Iron deficiency anemia, unspecified: Secondary | ICD-10-CM | POA: Diagnosis not present

## 2019-04-10 DIAGNOSIS — Z992 Dependence on renal dialysis: Secondary | ICD-10-CM | POA: Diagnosis not present

## 2019-04-11 DIAGNOSIS — D631 Anemia in chronic kidney disease: Secondary | ICD-10-CM | POA: Diagnosis not present

## 2019-04-11 DIAGNOSIS — N186 End stage renal disease: Secondary | ICD-10-CM | POA: Diagnosis not present

## 2019-04-11 DIAGNOSIS — Z992 Dependence on renal dialysis: Secondary | ICD-10-CM | POA: Diagnosis not present

## 2019-04-11 DIAGNOSIS — D509 Iron deficiency anemia, unspecified: Secondary | ICD-10-CM | POA: Diagnosis not present

## 2019-04-12 DIAGNOSIS — D509 Iron deficiency anemia, unspecified: Secondary | ICD-10-CM | POA: Diagnosis not present

## 2019-04-12 DIAGNOSIS — N186 End stage renal disease: Secondary | ICD-10-CM | POA: Diagnosis not present

## 2019-04-12 DIAGNOSIS — Z992 Dependence on renal dialysis: Secondary | ICD-10-CM | POA: Diagnosis not present

## 2019-04-12 DIAGNOSIS — D631 Anemia in chronic kidney disease: Secondary | ICD-10-CM | POA: Diagnosis not present

## 2019-04-13 DIAGNOSIS — N186 End stage renal disease: Secondary | ICD-10-CM | POA: Diagnosis not present

## 2019-04-13 DIAGNOSIS — D631 Anemia in chronic kidney disease: Secondary | ICD-10-CM | POA: Diagnosis not present

## 2019-04-13 DIAGNOSIS — Z992 Dependence on renal dialysis: Secondary | ICD-10-CM | POA: Diagnosis not present

## 2019-04-13 DIAGNOSIS — D509 Iron deficiency anemia, unspecified: Secondary | ICD-10-CM | POA: Diagnosis not present

## 2019-04-14 DIAGNOSIS — D509 Iron deficiency anemia, unspecified: Secondary | ICD-10-CM | POA: Diagnosis not present

## 2019-04-14 DIAGNOSIS — N186 End stage renal disease: Secondary | ICD-10-CM | POA: Diagnosis not present

## 2019-04-14 DIAGNOSIS — D631 Anemia in chronic kidney disease: Secondary | ICD-10-CM | POA: Diagnosis not present

## 2019-04-14 DIAGNOSIS — Z992 Dependence on renal dialysis: Secondary | ICD-10-CM | POA: Diagnosis not present

## 2019-04-15 DIAGNOSIS — D509 Iron deficiency anemia, unspecified: Secondary | ICD-10-CM | POA: Diagnosis not present

## 2019-04-15 DIAGNOSIS — N186 End stage renal disease: Secondary | ICD-10-CM | POA: Diagnosis not present

## 2019-04-15 DIAGNOSIS — Z992 Dependence on renal dialysis: Secondary | ICD-10-CM | POA: Diagnosis not present

## 2019-04-15 DIAGNOSIS — D631 Anemia in chronic kidney disease: Secondary | ICD-10-CM | POA: Diagnosis not present

## 2019-04-16 DIAGNOSIS — Z992 Dependence on renal dialysis: Secondary | ICD-10-CM | POA: Diagnosis not present

## 2019-04-16 DIAGNOSIS — D631 Anemia in chronic kidney disease: Secondary | ICD-10-CM | POA: Diagnosis not present

## 2019-04-16 DIAGNOSIS — N186 End stage renal disease: Secondary | ICD-10-CM | POA: Diagnosis not present

## 2019-04-16 DIAGNOSIS — D509 Iron deficiency anemia, unspecified: Secondary | ICD-10-CM | POA: Diagnosis not present

## 2019-04-17 DIAGNOSIS — D509 Iron deficiency anemia, unspecified: Secondary | ICD-10-CM | POA: Diagnosis not present

## 2019-04-17 DIAGNOSIS — Z992 Dependence on renal dialysis: Secondary | ICD-10-CM | POA: Diagnosis not present

## 2019-04-17 DIAGNOSIS — D631 Anemia in chronic kidney disease: Secondary | ICD-10-CM | POA: Diagnosis not present

## 2019-04-17 DIAGNOSIS — N186 End stage renal disease: Secondary | ICD-10-CM | POA: Diagnosis not present

## 2019-04-18 ENCOUNTER — Institutional Professional Consult (permissible substitution): Payer: Medicare Other | Admitting: Neurology

## 2019-04-18 DIAGNOSIS — N186 End stage renal disease: Secondary | ICD-10-CM | POA: Diagnosis not present

## 2019-04-18 DIAGNOSIS — Z992 Dependence on renal dialysis: Secondary | ICD-10-CM | POA: Diagnosis not present

## 2019-04-18 DIAGNOSIS — D509 Iron deficiency anemia, unspecified: Secondary | ICD-10-CM | POA: Diagnosis not present

## 2019-04-18 DIAGNOSIS — D631 Anemia in chronic kidney disease: Secondary | ICD-10-CM | POA: Diagnosis not present

## 2019-04-19 DIAGNOSIS — Z992 Dependence on renal dialysis: Secondary | ICD-10-CM | POA: Diagnosis not present

## 2019-04-19 DIAGNOSIS — N186 End stage renal disease: Secondary | ICD-10-CM | POA: Diagnosis not present

## 2019-04-19 DIAGNOSIS — D509 Iron deficiency anemia, unspecified: Secondary | ICD-10-CM | POA: Diagnosis not present

## 2019-04-19 DIAGNOSIS — D631 Anemia in chronic kidney disease: Secondary | ICD-10-CM | POA: Diagnosis not present

## 2019-04-20 DIAGNOSIS — D631 Anemia in chronic kidney disease: Secondary | ICD-10-CM | POA: Diagnosis not present

## 2019-04-20 DIAGNOSIS — Z992 Dependence on renal dialysis: Secondary | ICD-10-CM | POA: Diagnosis not present

## 2019-04-20 DIAGNOSIS — D509 Iron deficiency anemia, unspecified: Secondary | ICD-10-CM | POA: Diagnosis not present

## 2019-04-20 DIAGNOSIS — N186 End stage renal disease: Secondary | ICD-10-CM | POA: Diagnosis not present

## 2019-04-21 DIAGNOSIS — Z992 Dependence on renal dialysis: Secondary | ICD-10-CM | POA: Diagnosis not present

## 2019-04-21 DIAGNOSIS — D509 Iron deficiency anemia, unspecified: Secondary | ICD-10-CM | POA: Diagnosis not present

## 2019-04-21 DIAGNOSIS — N186 End stage renal disease: Secondary | ICD-10-CM | POA: Diagnosis not present

## 2019-04-21 DIAGNOSIS — D631 Anemia in chronic kidney disease: Secondary | ICD-10-CM | POA: Diagnosis not present

## 2019-04-22 DIAGNOSIS — D631 Anemia in chronic kidney disease: Secondary | ICD-10-CM | POA: Diagnosis not present

## 2019-04-22 DIAGNOSIS — Z992 Dependence on renal dialysis: Secondary | ICD-10-CM | POA: Diagnosis not present

## 2019-04-22 DIAGNOSIS — N186 End stage renal disease: Secondary | ICD-10-CM | POA: Diagnosis not present

## 2019-04-22 DIAGNOSIS — D509 Iron deficiency anemia, unspecified: Secondary | ICD-10-CM | POA: Diagnosis not present

## 2019-04-23 ENCOUNTER — Other Ambulatory Visit: Payer: Self-pay | Admitting: Physician Assistant

## 2019-04-23 DIAGNOSIS — Z992 Dependence on renal dialysis: Secondary | ICD-10-CM | POA: Diagnosis not present

## 2019-04-23 DIAGNOSIS — D509 Iron deficiency anemia, unspecified: Secondary | ICD-10-CM | POA: Diagnosis not present

## 2019-04-23 DIAGNOSIS — N186 End stage renal disease: Secondary | ICD-10-CM | POA: Diagnosis not present

## 2019-04-23 DIAGNOSIS — L570 Actinic keratosis: Secondary | ICD-10-CM | POA: Diagnosis not present

## 2019-04-23 DIAGNOSIS — L309 Dermatitis, unspecified: Secondary | ICD-10-CM | POA: Diagnosis not present

## 2019-04-23 DIAGNOSIS — D0439 Carcinoma in situ of skin of other parts of face: Secondary | ICD-10-CM | POA: Diagnosis not present

## 2019-04-23 DIAGNOSIS — D631 Anemia in chronic kidney disease: Secondary | ICD-10-CM | POA: Diagnosis not present

## 2019-04-24 DIAGNOSIS — D631 Anemia in chronic kidney disease: Secondary | ICD-10-CM | POA: Diagnosis not present

## 2019-04-24 DIAGNOSIS — D509 Iron deficiency anemia, unspecified: Secondary | ICD-10-CM | POA: Diagnosis not present

## 2019-04-24 DIAGNOSIS — Z992 Dependence on renal dialysis: Secondary | ICD-10-CM | POA: Diagnosis not present

## 2019-04-24 DIAGNOSIS — N186 End stage renal disease: Secondary | ICD-10-CM | POA: Diagnosis not present

## 2019-04-25 DIAGNOSIS — N186 End stage renal disease: Secondary | ICD-10-CM | POA: Diagnosis not present

## 2019-04-25 DIAGNOSIS — D509 Iron deficiency anemia, unspecified: Secondary | ICD-10-CM | POA: Diagnosis not present

## 2019-04-25 DIAGNOSIS — Z992 Dependence on renal dialysis: Secondary | ICD-10-CM | POA: Diagnosis not present

## 2019-04-25 DIAGNOSIS — D631 Anemia in chronic kidney disease: Secondary | ICD-10-CM | POA: Diagnosis not present

## 2019-04-26 ENCOUNTER — Other Ambulatory Visit (INDEPENDENT_AMBULATORY_CARE_PROVIDER_SITE_OTHER): Payer: Self-pay | Admitting: Otolaryngology

## 2019-04-26 DIAGNOSIS — D509 Iron deficiency anemia, unspecified: Secondary | ICD-10-CM | POA: Diagnosis not present

## 2019-04-26 DIAGNOSIS — D3709 Neoplasm of uncertain behavior of other specified sites of the oral cavity: Secondary | ICD-10-CM | POA: Diagnosis not present

## 2019-04-26 DIAGNOSIS — K1329 Other disturbances of oral epithelium, including tongue: Secondary | ICD-10-CM | POA: Diagnosis not present

## 2019-04-26 DIAGNOSIS — N186 End stage renal disease: Secondary | ICD-10-CM | POA: Diagnosis not present

## 2019-04-26 DIAGNOSIS — Z992 Dependence on renal dialysis: Secondary | ICD-10-CM | POA: Diagnosis not present

## 2019-04-26 DIAGNOSIS — D631 Anemia in chronic kidney disease: Secondary | ICD-10-CM | POA: Diagnosis not present

## 2019-04-27 DIAGNOSIS — D509 Iron deficiency anemia, unspecified: Secondary | ICD-10-CM | POA: Diagnosis not present

## 2019-04-27 DIAGNOSIS — Z992 Dependence on renal dialysis: Secondary | ICD-10-CM | POA: Diagnosis not present

## 2019-04-27 DIAGNOSIS — D631 Anemia in chronic kidney disease: Secondary | ICD-10-CM | POA: Diagnosis not present

## 2019-04-27 DIAGNOSIS — N186 End stage renal disease: Secondary | ICD-10-CM | POA: Diagnosis not present

## 2019-04-28 DIAGNOSIS — N186 End stage renal disease: Secondary | ICD-10-CM | POA: Diagnosis not present

## 2019-04-28 DIAGNOSIS — D509 Iron deficiency anemia, unspecified: Secondary | ICD-10-CM | POA: Diagnosis not present

## 2019-04-28 DIAGNOSIS — D631 Anemia in chronic kidney disease: Secondary | ICD-10-CM | POA: Diagnosis not present

## 2019-04-28 DIAGNOSIS — Z992 Dependence on renal dialysis: Secondary | ICD-10-CM | POA: Diagnosis not present

## 2019-04-29 DIAGNOSIS — N186 End stage renal disease: Secondary | ICD-10-CM | POA: Diagnosis not present

## 2019-04-29 DIAGNOSIS — Z01812 Encounter for preprocedural laboratory examination: Secondary | ICD-10-CM | POA: Diagnosis not present

## 2019-04-29 DIAGNOSIS — Z992 Dependence on renal dialysis: Secondary | ICD-10-CM | POA: Diagnosis not present

## 2019-04-29 DIAGNOSIS — D631 Anemia in chronic kidney disease: Secondary | ICD-10-CM | POA: Diagnosis not present

## 2019-04-29 DIAGNOSIS — N2581 Secondary hyperparathyroidism of renal origin: Secondary | ICD-10-CM | POA: Diagnosis not present

## 2019-04-29 DIAGNOSIS — D509 Iron deficiency anemia, unspecified: Secondary | ICD-10-CM | POA: Diagnosis not present

## 2019-04-30 DIAGNOSIS — Z992 Dependence on renal dialysis: Secondary | ICD-10-CM | POA: Diagnosis not present

## 2019-04-30 DIAGNOSIS — D631 Anemia in chronic kidney disease: Secondary | ICD-10-CM | POA: Diagnosis not present

## 2019-04-30 DIAGNOSIS — N186 End stage renal disease: Secondary | ICD-10-CM | POA: Diagnosis not present

## 2019-04-30 DIAGNOSIS — Z01812 Encounter for preprocedural laboratory examination: Secondary | ICD-10-CM | POA: Diagnosis not present

## 2019-04-30 DIAGNOSIS — N2581 Secondary hyperparathyroidism of renal origin: Secondary | ICD-10-CM | POA: Diagnosis not present

## 2019-04-30 DIAGNOSIS — D509 Iron deficiency anemia, unspecified: Secondary | ICD-10-CM | POA: Diagnosis not present

## 2019-05-01 DIAGNOSIS — D631 Anemia in chronic kidney disease: Secondary | ICD-10-CM | POA: Diagnosis not present

## 2019-05-01 DIAGNOSIS — Z01812 Encounter for preprocedural laboratory examination: Secondary | ICD-10-CM | POA: Diagnosis not present

## 2019-05-01 DIAGNOSIS — Z992 Dependence on renal dialysis: Secondary | ICD-10-CM | POA: Diagnosis not present

## 2019-05-01 DIAGNOSIS — D509 Iron deficiency anemia, unspecified: Secondary | ICD-10-CM | POA: Diagnosis not present

## 2019-05-01 DIAGNOSIS — N2581 Secondary hyperparathyroidism of renal origin: Secondary | ICD-10-CM | POA: Diagnosis not present

## 2019-05-01 DIAGNOSIS — N186 End stage renal disease: Secondary | ICD-10-CM | POA: Diagnosis not present

## 2019-05-02 DIAGNOSIS — D509 Iron deficiency anemia, unspecified: Secondary | ICD-10-CM | POA: Diagnosis not present

## 2019-05-02 DIAGNOSIS — Z992 Dependence on renal dialysis: Secondary | ICD-10-CM | POA: Diagnosis not present

## 2019-05-02 DIAGNOSIS — D631 Anemia in chronic kidney disease: Secondary | ICD-10-CM | POA: Diagnosis not present

## 2019-05-02 DIAGNOSIS — N186 End stage renal disease: Secondary | ICD-10-CM | POA: Diagnosis not present

## 2019-05-02 DIAGNOSIS — Z01812 Encounter for preprocedural laboratory examination: Secondary | ICD-10-CM | POA: Diagnosis not present

## 2019-05-02 DIAGNOSIS — N2581 Secondary hyperparathyroidism of renal origin: Secondary | ICD-10-CM | POA: Diagnosis not present

## 2019-05-03 DIAGNOSIS — Z01812 Encounter for preprocedural laboratory examination: Secondary | ICD-10-CM | POA: Diagnosis not present

## 2019-05-03 DIAGNOSIS — Z5309 Procedure and treatment not carried out because of other contraindication: Secondary | ICD-10-CM | POA: Diagnosis present

## 2019-05-03 DIAGNOSIS — I120 Hypertensive chronic kidney disease with stage 5 chronic kidney disease or end stage renal disease: Secondary | ICD-10-CM | POA: Diagnosis present

## 2019-05-03 DIAGNOSIS — D631 Anemia in chronic kidney disease: Secondary | ICD-10-CM | POA: Diagnosis not present

## 2019-05-03 DIAGNOSIS — U071 COVID-19: Secondary | ICD-10-CM | POA: Diagnosis not present

## 2019-05-03 DIAGNOSIS — Z01810 Encounter for preprocedural cardiovascular examination: Secondary | ICD-10-CM | POA: Diagnosis not present

## 2019-05-03 DIAGNOSIS — E875 Hyperkalemia: Secondary | ICD-10-CM | POA: Diagnosis not present

## 2019-05-03 DIAGNOSIS — J9811 Atelectasis: Secondary | ICD-10-CM | POA: Diagnosis not present

## 2019-05-03 DIAGNOSIS — Z7682 Awaiting organ transplant status: Secondary | ICD-10-CM | POA: Diagnosis not present

## 2019-05-03 DIAGNOSIS — N186 End stage renal disease: Secondary | ICD-10-CM | POA: Diagnosis not present

## 2019-05-03 DIAGNOSIS — Z992 Dependence on renal dialysis: Secondary | ICD-10-CM | POA: Diagnosis not present

## 2019-05-03 DIAGNOSIS — D509 Iron deficiency anemia, unspecified: Secondary | ICD-10-CM | POA: Diagnosis not present

## 2019-05-03 DIAGNOSIS — N2581 Secondary hyperparathyroidism of renal origin: Secondary | ICD-10-CM | POA: Diagnosis not present

## 2019-05-03 DIAGNOSIS — Z01818 Encounter for other preprocedural examination: Secondary | ICD-10-CM | POA: Diagnosis not present

## 2019-05-03 DIAGNOSIS — Z905 Acquired absence of kidney: Secondary | ICD-10-CM | POA: Diagnosis not present

## 2019-05-04 DIAGNOSIS — D631 Anemia in chronic kidney disease: Secondary | ICD-10-CM | POA: Diagnosis not present

## 2019-05-04 DIAGNOSIS — Z992 Dependence on renal dialysis: Secondary | ICD-10-CM | POA: Diagnosis not present

## 2019-05-04 DIAGNOSIS — D509 Iron deficiency anemia, unspecified: Secondary | ICD-10-CM | POA: Diagnosis not present

## 2019-05-04 DIAGNOSIS — Z01812 Encounter for preprocedural laboratory examination: Secondary | ICD-10-CM | POA: Diagnosis not present

## 2019-05-04 DIAGNOSIS — N2581 Secondary hyperparathyroidism of renal origin: Secondary | ICD-10-CM | POA: Diagnosis not present

## 2019-05-04 DIAGNOSIS — N186 End stage renal disease: Secondary | ICD-10-CM | POA: Diagnosis not present

## 2019-05-05 DIAGNOSIS — N2581 Secondary hyperparathyroidism of renal origin: Secondary | ICD-10-CM | POA: Diagnosis not present

## 2019-05-05 DIAGNOSIS — D631 Anemia in chronic kidney disease: Secondary | ICD-10-CM | POA: Diagnosis not present

## 2019-05-05 DIAGNOSIS — Z01812 Encounter for preprocedural laboratory examination: Secondary | ICD-10-CM | POA: Diagnosis not present

## 2019-05-05 DIAGNOSIS — Z992 Dependence on renal dialysis: Secondary | ICD-10-CM | POA: Diagnosis not present

## 2019-05-05 DIAGNOSIS — D509 Iron deficiency anemia, unspecified: Secondary | ICD-10-CM | POA: Diagnosis not present

## 2019-05-05 DIAGNOSIS — N186 End stage renal disease: Secondary | ICD-10-CM | POA: Diagnosis not present

## 2019-05-06 DIAGNOSIS — Z01812 Encounter for preprocedural laboratory examination: Secondary | ICD-10-CM | POA: Diagnosis not present

## 2019-05-06 DIAGNOSIS — D509 Iron deficiency anemia, unspecified: Secondary | ICD-10-CM | POA: Diagnosis not present

## 2019-05-06 DIAGNOSIS — D631 Anemia in chronic kidney disease: Secondary | ICD-10-CM | POA: Diagnosis not present

## 2019-05-06 DIAGNOSIS — N186 End stage renal disease: Secondary | ICD-10-CM | POA: Diagnosis not present

## 2019-05-06 DIAGNOSIS — Z992 Dependence on renal dialysis: Secondary | ICD-10-CM | POA: Diagnosis not present

## 2019-05-06 DIAGNOSIS — N2581 Secondary hyperparathyroidism of renal origin: Secondary | ICD-10-CM | POA: Diagnosis not present

## 2019-05-07 DIAGNOSIS — Z992 Dependence on renal dialysis: Secondary | ICD-10-CM | POA: Diagnosis not present

## 2019-05-07 DIAGNOSIS — Z01812 Encounter for preprocedural laboratory examination: Secondary | ICD-10-CM | POA: Diagnosis not present

## 2019-05-07 DIAGNOSIS — D631 Anemia in chronic kidney disease: Secondary | ICD-10-CM | POA: Diagnosis not present

## 2019-05-07 DIAGNOSIS — D509 Iron deficiency anemia, unspecified: Secondary | ICD-10-CM | POA: Diagnosis not present

## 2019-05-07 DIAGNOSIS — N2581 Secondary hyperparathyroidism of renal origin: Secondary | ICD-10-CM | POA: Diagnosis not present

## 2019-05-07 DIAGNOSIS — N186 End stage renal disease: Secondary | ICD-10-CM | POA: Diagnosis not present

## 2019-05-08 ENCOUNTER — Ambulatory Visit: Payer: Medicare Other | Admitting: Orthopaedic Surgery

## 2019-05-08 DIAGNOSIS — D509 Iron deficiency anemia, unspecified: Secondary | ICD-10-CM | POA: Diagnosis not present

## 2019-05-08 DIAGNOSIS — N2581 Secondary hyperparathyroidism of renal origin: Secondary | ICD-10-CM | POA: Diagnosis not present

## 2019-05-08 DIAGNOSIS — Z992 Dependence on renal dialysis: Secondary | ICD-10-CM | POA: Diagnosis not present

## 2019-05-08 DIAGNOSIS — Z01812 Encounter for preprocedural laboratory examination: Secondary | ICD-10-CM | POA: Diagnosis not present

## 2019-05-08 DIAGNOSIS — D631 Anemia in chronic kidney disease: Secondary | ICD-10-CM | POA: Diagnosis not present

## 2019-05-08 DIAGNOSIS — N186 End stage renal disease: Secondary | ICD-10-CM | POA: Diagnosis not present

## 2019-05-09 ENCOUNTER — Other Ambulatory Visit (HOSPITAL_COMMUNITY): Payer: Medicare Other

## 2019-05-09 DIAGNOSIS — D509 Iron deficiency anemia, unspecified: Secondary | ICD-10-CM | POA: Diagnosis not present

## 2019-05-09 DIAGNOSIS — Z01812 Encounter for preprocedural laboratory examination: Secondary | ICD-10-CM | POA: Diagnosis not present

## 2019-05-09 DIAGNOSIS — N2581 Secondary hyperparathyroidism of renal origin: Secondary | ICD-10-CM | POA: Diagnosis not present

## 2019-05-09 DIAGNOSIS — Z992 Dependence on renal dialysis: Secondary | ICD-10-CM | POA: Diagnosis not present

## 2019-05-09 DIAGNOSIS — N186 End stage renal disease: Secondary | ICD-10-CM | POA: Diagnosis not present

## 2019-05-09 DIAGNOSIS — D631 Anemia in chronic kidney disease: Secondary | ICD-10-CM | POA: Diagnosis not present

## 2019-05-10 DIAGNOSIS — N2581 Secondary hyperparathyroidism of renal origin: Secondary | ICD-10-CM | POA: Diagnosis not present

## 2019-05-10 DIAGNOSIS — D631 Anemia in chronic kidney disease: Secondary | ICD-10-CM | POA: Diagnosis not present

## 2019-05-10 DIAGNOSIS — D509 Iron deficiency anemia, unspecified: Secondary | ICD-10-CM | POA: Diagnosis not present

## 2019-05-10 DIAGNOSIS — N186 End stage renal disease: Secondary | ICD-10-CM | POA: Diagnosis not present

## 2019-05-10 DIAGNOSIS — Z992 Dependence on renal dialysis: Secondary | ICD-10-CM | POA: Diagnosis not present

## 2019-05-10 DIAGNOSIS — Z01812 Encounter for preprocedural laboratory examination: Secondary | ICD-10-CM | POA: Diagnosis not present

## 2019-05-11 DIAGNOSIS — Z992 Dependence on renal dialysis: Secondary | ICD-10-CM | POA: Diagnosis not present

## 2019-05-11 DIAGNOSIS — D509 Iron deficiency anemia, unspecified: Secondary | ICD-10-CM | POA: Diagnosis not present

## 2019-05-11 DIAGNOSIS — N186 End stage renal disease: Secondary | ICD-10-CM | POA: Diagnosis not present

## 2019-05-11 DIAGNOSIS — N2581 Secondary hyperparathyroidism of renal origin: Secondary | ICD-10-CM | POA: Diagnosis not present

## 2019-05-11 DIAGNOSIS — Z01812 Encounter for preprocedural laboratory examination: Secondary | ICD-10-CM | POA: Diagnosis not present

## 2019-05-11 DIAGNOSIS — D631 Anemia in chronic kidney disease: Secondary | ICD-10-CM | POA: Diagnosis not present

## 2019-05-12 DIAGNOSIS — N2581 Secondary hyperparathyroidism of renal origin: Secondary | ICD-10-CM | POA: Diagnosis not present

## 2019-05-12 DIAGNOSIS — D509 Iron deficiency anemia, unspecified: Secondary | ICD-10-CM | POA: Diagnosis not present

## 2019-05-12 DIAGNOSIS — Z01812 Encounter for preprocedural laboratory examination: Secondary | ICD-10-CM | POA: Diagnosis not present

## 2019-05-12 DIAGNOSIS — N186 End stage renal disease: Secondary | ICD-10-CM | POA: Diagnosis not present

## 2019-05-12 DIAGNOSIS — D631 Anemia in chronic kidney disease: Secondary | ICD-10-CM | POA: Diagnosis not present

## 2019-05-12 DIAGNOSIS — Z992 Dependence on renal dialysis: Secondary | ICD-10-CM | POA: Diagnosis not present

## 2019-05-13 DIAGNOSIS — Z01812 Encounter for preprocedural laboratory examination: Secondary | ICD-10-CM | POA: Diagnosis not present

## 2019-05-13 DIAGNOSIS — N2581 Secondary hyperparathyroidism of renal origin: Secondary | ICD-10-CM | POA: Diagnosis not present

## 2019-05-13 DIAGNOSIS — Z992 Dependence on renal dialysis: Secondary | ICD-10-CM | POA: Diagnosis not present

## 2019-05-13 DIAGNOSIS — D509 Iron deficiency anemia, unspecified: Secondary | ICD-10-CM | POA: Diagnosis not present

## 2019-05-13 DIAGNOSIS — D631 Anemia in chronic kidney disease: Secondary | ICD-10-CM | POA: Diagnosis not present

## 2019-05-13 DIAGNOSIS — N186 End stage renal disease: Secondary | ICD-10-CM | POA: Diagnosis not present

## 2019-05-14 DIAGNOSIS — N186 End stage renal disease: Secondary | ICD-10-CM | POA: Diagnosis not present

## 2019-05-14 DIAGNOSIS — D509 Iron deficiency anemia, unspecified: Secondary | ICD-10-CM | POA: Diagnosis not present

## 2019-05-14 DIAGNOSIS — D631 Anemia in chronic kidney disease: Secondary | ICD-10-CM | POA: Diagnosis not present

## 2019-05-14 DIAGNOSIS — N2581 Secondary hyperparathyroidism of renal origin: Secondary | ICD-10-CM | POA: Diagnosis not present

## 2019-05-14 DIAGNOSIS — Z01812 Encounter for preprocedural laboratory examination: Secondary | ICD-10-CM | POA: Diagnosis not present

## 2019-05-14 DIAGNOSIS — Z992 Dependence on renal dialysis: Secondary | ICD-10-CM | POA: Diagnosis not present

## 2019-05-15 ENCOUNTER — Institutional Professional Consult (permissible substitution): Payer: Self-pay | Admitting: Neurology

## 2019-05-15 DIAGNOSIS — D509 Iron deficiency anemia, unspecified: Secondary | ICD-10-CM | POA: Diagnosis not present

## 2019-05-15 DIAGNOSIS — Z01812 Encounter for preprocedural laboratory examination: Secondary | ICD-10-CM | POA: Diagnosis not present

## 2019-05-15 DIAGNOSIS — D631 Anemia in chronic kidney disease: Secondary | ICD-10-CM | POA: Diagnosis not present

## 2019-05-15 DIAGNOSIS — N2581 Secondary hyperparathyroidism of renal origin: Secondary | ICD-10-CM | POA: Diagnosis not present

## 2019-05-15 DIAGNOSIS — N186 End stage renal disease: Secondary | ICD-10-CM | POA: Diagnosis not present

## 2019-05-15 DIAGNOSIS — Z992 Dependence on renal dialysis: Secondary | ICD-10-CM | POA: Diagnosis not present

## 2019-05-16 ENCOUNTER — Ambulatory Visit (HOSPITAL_COMMUNITY): Payer: Medicare Other | Admitting: Hematology

## 2019-05-16 DIAGNOSIS — D509 Iron deficiency anemia, unspecified: Secondary | ICD-10-CM | POA: Diagnosis not present

## 2019-05-16 DIAGNOSIS — N186 End stage renal disease: Secondary | ICD-10-CM | POA: Diagnosis not present

## 2019-05-16 DIAGNOSIS — D631 Anemia in chronic kidney disease: Secondary | ICD-10-CM | POA: Diagnosis not present

## 2019-05-16 DIAGNOSIS — N2581 Secondary hyperparathyroidism of renal origin: Secondary | ICD-10-CM | POA: Diagnosis not present

## 2019-05-16 DIAGNOSIS — Z01812 Encounter for preprocedural laboratory examination: Secondary | ICD-10-CM | POA: Diagnosis not present

## 2019-05-16 DIAGNOSIS — Z992 Dependence on renal dialysis: Secondary | ICD-10-CM | POA: Diagnosis not present

## 2019-05-17 DIAGNOSIS — N2581 Secondary hyperparathyroidism of renal origin: Secondary | ICD-10-CM | POA: Diagnosis not present

## 2019-05-17 DIAGNOSIS — D509 Iron deficiency anemia, unspecified: Secondary | ICD-10-CM | POA: Diagnosis not present

## 2019-05-17 DIAGNOSIS — Z01812 Encounter for preprocedural laboratory examination: Secondary | ICD-10-CM | POA: Diagnosis not present

## 2019-05-17 DIAGNOSIS — D631 Anemia in chronic kidney disease: Secondary | ICD-10-CM | POA: Diagnosis not present

## 2019-05-17 DIAGNOSIS — Z992 Dependence on renal dialysis: Secondary | ICD-10-CM | POA: Diagnosis not present

## 2019-05-17 DIAGNOSIS — N186 End stage renal disease: Secondary | ICD-10-CM | POA: Diagnosis not present

## 2019-05-18 DIAGNOSIS — Z01812 Encounter for preprocedural laboratory examination: Secondary | ICD-10-CM | POA: Diagnosis not present

## 2019-05-18 DIAGNOSIS — N2581 Secondary hyperparathyroidism of renal origin: Secondary | ICD-10-CM | POA: Diagnosis not present

## 2019-05-18 DIAGNOSIS — Z992 Dependence on renal dialysis: Secondary | ICD-10-CM | POA: Diagnosis not present

## 2019-05-18 DIAGNOSIS — N186 End stage renal disease: Secondary | ICD-10-CM | POA: Diagnosis not present

## 2019-05-18 DIAGNOSIS — D631 Anemia in chronic kidney disease: Secondary | ICD-10-CM | POA: Diagnosis not present

## 2019-05-18 DIAGNOSIS — D509 Iron deficiency anemia, unspecified: Secondary | ICD-10-CM | POA: Diagnosis not present

## 2019-05-19 DIAGNOSIS — N186 End stage renal disease: Secondary | ICD-10-CM | POA: Diagnosis not present

## 2019-05-19 DIAGNOSIS — D631 Anemia in chronic kidney disease: Secondary | ICD-10-CM | POA: Diagnosis not present

## 2019-05-19 DIAGNOSIS — N2581 Secondary hyperparathyroidism of renal origin: Secondary | ICD-10-CM | POA: Diagnosis not present

## 2019-05-19 DIAGNOSIS — Z992 Dependence on renal dialysis: Secondary | ICD-10-CM | POA: Diagnosis not present

## 2019-05-19 DIAGNOSIS — Z01812 Encounter for preprocedural laboratory examination: Secondary | ICD-10-CM | POA: Diagnosis not present

## 2019-05-19 DIAGNOSIS — D509 Iron deficiency anemia, unspecified: Secondary | ICD-10-CM | POA: Diagnosis not present

## 2019-05-20 DIAGNOSIS — D631 Anemia in chronic kidney disease: Secondary | ICD-10-CM | POA: Diagnosis not present

## 2019-05-20 DIAGNOSIS — Z992 Dependence on renal dialysis: Secondary | ICD-10-CM | POA: Diagnosis not present

## 2019-05-20 DIAGNOSIS — N186 End stage renal disease: Secondary | ICD-10-CM | POA: Diagnosis not present

## 2019-05-20 DIAGNOSIS — N2581 Secondary hyperparathyroidism of renal origin: Secondary | ICD-10-CM | POA: Diagnosis not present

## 2019-05-20 DIAGNOSIS — Z01812 Encounter for preprocedural laboratory examination: Secondary | ICD-10-CM | POA: Diagnosis not present

## 2019-05-20 DIAGNOSIS — D509 Iron deficiency anemia, unspecified: Secondary | ICD-10-CM | POA: Diagnosis not present

## 2019-05-21 DIAGNOSIS — D631 Anemia in chronic kidney disease: Secondary | ICD-10-CM | POA: Diagnosis not present

## 2019-05-21 DIAGNOSIS — Z01812 Encounter for preprocedural laboratory examination: Secondary | ICD-10-CM | POA: Diagnosis not present

## 2019-05-21 DIAGNOSIS — D509 Iron deficiency anemia, unspecified: Secondary | ICD-10-CM | POA: Diagnosis not present

## 2019-05-21 DIAGNOSIS — N186 End stage renal disease: Secondary | ICD-10-CM | POA: Diagnosis not present

## 2019-05-21 DIAGNOSIS — Z992 Dependence on renal dialysis: Secondary | ICD-10-CM | POA: Diagnosis not present

## 2019-05-21 DIAGNOSIS — N2581 Secondary hyperparathyroidism of renal origin: Secondary | ICD-10-CM | POA: Diagnosis not present

## 2019-05-22 ENCOUNTER — Ambulatory Visit (INDEPENDENT_AMBULATORY_CARE_PROVIDER_SITE_OTHER): Payer: Medicare Other | Admitting: Orthopaedic Surgery

## 2019-05-22 ENCOUNTER — Encounter: Payer: Self-pay | Admitting: Orthopaedic Surgery

## 2019-05-22 DIAGNOSIS — N2581 Secondary hyperparathyroidism of renal origin: Secondary | ICD-10-CM | POA: Diagnosis not present

## 2019-05-22 DIAGNOSIS — D509 Iron deficiency anemia, unspecified: Secondary | ICD-10-CM | POA: Diagnosis not present

## 2019-05-22 DIAGNOSIS — Z01812 Encounter for preprocedural laboratory examination: Secondary | ICD-10-CM | POA: Diagnosis not present

## 2019-05-22 DIAGNOSIS — M1712 Unilateral primary osteoarthritis, left knee: Secondary | ICD-10-CM | POA: Diagnosis not present

## 2019-05-22 DIAGNOSIS — D631 Anemia in chronic kidney disease: Secondary | ICD-10-CM | POA: Diagnosis not present

## 2019-05-22 DIAGNOSIS — Z992 Dependence on renal dialysis: Secondary | ICD-10-CM | POA: Diagnosis not present

## 2019-05-22 DIAGNOSIS — N186 End stage renal disease: Secondary | ICD-10-CM | POA: Diagnosis not present

## 2019-05-22 MED ORDER — BUPIVACAINE HCL 0.5 % IJ SOLN
2.0000 mL | INTRAMUSCULAR | Status: AC | PRN
  Administered 2019-05-22: 2 mL via INTRA_ARTICULAR

## 2019-05-22 MED ORDER — METHYLPREDNISOLONE ACETATE 40 MG/ML IJ SUSP
80.0000 mg | INTRAMUSCULAR | Status: AC | PRN
  Administered 2019-05-22: 80 mg via INTRA_ARTICULAR

## 2019-05-22 MED ORDER — LIDOCAINE HCL 1 % IJ SOLN
2.0000 mL | INTRAMUSCULAR | Status: AC | PRN
  Administered 2019-05-22: 2 mL

## 2019-05-22 NOTE — Progress Notes (Signed)
Office Visit Note   Patient: Jesus [name]           Date of Birth: 06/26/1950           MRN: [id number] Visit Date: 05/22/2019              Requested by: No referring provider defined for this encounter. PCP: Patient, No Pcp Per   Assessment & Plan: Visit Diagnoses:  1. Unilateral primary osteoarthritis, left knee     Plan: Jesus [name] has had a previous successful right total knee replacement.  He does have arthritis of his left knee but has been hesitant to proceed with knee replacement for many reasons.  He is done well with cortisone injections in the past and wishes to have another today.  I injected the medial aspect of his left knee with cortisone and will monitor his response.  Follow-Up Instructions: Return if symptoms worsen or fail to improve.   Orders:  Orders Placed This Encounter  Procedures  . Large Joint Inj: L knee   No orders of the defined types were placed in this encounter.     Procedures: Large Joint Inj: L knee on 05/22/2019 1:02 PM Indications: pain and diagnostic evaluation Details: 25 G 1.5 in needle, anteromedial approach  Arthrogram: No  Medications: 2 mL lidocaine 1 %; 2 mL bupivacaine 0.5 %; 80 mg methylPREDNISolone acetate 40 MG/ML Procedure, treatment alternatives, risks and benefits explained, specific risks discussed. Consent was given by the patient. Patient was prepped and draped in the usual sterile fashion.       Clinical Data: No additional findings.   Subjective: No chief complaint on file. Jesus [name] is 69 years old and has had a prior diagnosis of bilateral knee osteoarthritis.  He several years status post primary right total knee replacement and doing well.  He has had x-rays consistent with moderate arthritis of his left knee.  He has had cortisone injections in the left knee spaced almost a year apart and is done quite well.  He is not had any injury or trauma.  HPI  Review of Systems   Objective: Vital Signs: There  were no vitals taken for this visit.  Physical Exam Constitutional:      Appearance: He is well-developed.  Eyes:     Pupils: Pupils are equal, round, and reactive to light.  Pulmonary:     Effort: Pulmonary effort is normal.  Skin:    General: Skin is warm and dry.  Neurological:     Mental Status: He is alert and oriented to person, place, and time.  Psychiatric:        Behavior: Behavior normal.     Ortho Exam small effusion left knee.  Mostly medial joint pain and slight varus.  Full extension flexed over 100 degrees without instability.  A little bit of a popliteal fullness but no pain.  No calf pain.  No pain with range of motion of either hip.  Some patellar crepitation but no pain with compression no lateral joint pain  Specialty Comments:  No specialty comments available.  Imaging: No results found.   PMFS History: Patient Active Problem List   Diagnosis Date Noted  . Macrocytic anemia 11/29/2018  . Chronic pain of left knee 04/04/2018  . Current moderate episode of major depressive disorder without prior episode ([place]) 06/28/2017  . Renal cell carcinoma ([place]) 11/08/2016  . Pleural effusion 11/02/2016  . Anemia 11/02/2016  . Urothelial cancer ([place]) 03/31/2016  . ESRD  on dialysis ([place]) 09/07/2015  . GERD (gastroesophageal reflux disease) 04/07/2015  . HTN (hypertension) 04/07/2015  . Torn medial meniscus 02/26/2013  . 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 Anemia associated with acute blood loss 01/26/2012    Class: Acute  . Unilateral primary osteoarthritis, left knee 01/26/2012  . Postoperative anemia due to acute blood loss 01/26/2012  . Hyponatremia 01/26/2012   Past Medical History:  Diagnosis Date  . Anemia   . Anxiety   . Arthritis   . Cancer [facility])    bladder, ureter, Bil kidneys  . CHF (congestive heart failure) ([place])   . Current moderate episode of major depressive disorder without prior episode ([place]) 06/28/2017  .  Dermatitis    scaly bump occurs every few months, pt uses cortizone cream and it goes away  . ESRD (end stage renal disease) on dialysis ([place])    Bilateral Nephrectomies- due to cancer  . GERD (gastroesophageal reflux disease) 04/07/2015  . History of blood transfusion   . Hypertension   . Lumbar back pain   . Mental disorder   . Non compliance w medication regimen    written in error  . Pleural effusion, right    s/p right thoracentesis 10/07/15 (no malignancy by cytology report)  . Shortness of breath dyspnea    Lying down gets short of breath    Family History  Problem Relation Age of Onset  . Cancer Mother   . Hypertension Father   . Atrial fibrillation Father   . Heart disease Father   . Other Father        pacemaker    Past Surgical History:  Procedure Laterality Date  . AV FISTULA PLACEMENT Right 06/05/2015   Procedure: ARTERIOVENOUS (AV) FISTULA CREATION RIGHT ARM;  Surgeon: Angelia Mould, MD;  Location: [place];  Service: Vascular;  Laterality: Right;  . AV FISTULA PLACEMENT Left 07/25/2017   Procedure: RADIOCEPHALIC  ARTERIOVENOUS FISTULA LEFT ARM;  Surgeon: Conrad [place], MD;  Location: [place];  Service: Vascular;  Laterality: Left;  . BLADDER SURGERY  04-06-2015   removal of bladder/ [facility]   . CAPD INSERTION N/A 06/24/2016   Procedure: LAPAROSCOPIC INSERTION CONTINUOUS AMBULATORY PERITONEAL DIALYSIS  (CAPD) CATHETER;  Surgeon: Clovis Riley, MD;  Location: [place];  Service: General;  Laterality: N/A;  . CARPAL TUNNEL RELEASE     left hand x 2; right hand x 1; right elbow  . CERVICAL FUSION     x 2; 2001 & 2013  . EYE SURGERY Bilateral    Cataract removal  . FISTULA SUPERFICIALIZATION Right 10/30/2015   Procedure: SUPERFICIALIZATION BRACHIOCEPHALIC ARTERIOVENOUS FISTULA Right Arm;  Surgeon: Angelia Mould, MD;  Location: [place];  Service: Vascular;  Laterality: Right;  . Hemocatheter    . IR GENERIC HISTORICAL Right 04/29/2016   IR  THROMBECTOMY AV FISTULA W/THROMBOLYSIS/PTA INC/SHUNT/IMG RIGHT 04/29/2016 Arne Cleveland, MD [facility]  . IR GENERIC HISTORICAL  04/29/2016   IR US GUIDE VASC ACCESS RIGHT 04/29/2016 Arne Cleveland, MD [facility]  . IR THORACENTESIS ASP PLEURAL SPACE W/IMG GUIDE  12/06/2016  . KNEE ARTHROSCOPY Bilateral    bilateral; 2013 R; L [phone]  . KNEE ARTHROSCOPY WITH MEDIAL MENISECTOMY Left 02/26/2013   Procedure: KNEE ARTHROSCOPY WITH MEDIAL MENISECTOMY;  Surgeon: Garald Balding, MD;  Location: [place];  Service: Orthopedics;  Laterality: Left;  . LIGATION OF COMPETING BRANCHES OF ARTERIOVENOUS FISTULA Right 10/30/2015   Procedure: LIGATION OF COMPETING  BRANCHES OF BRACHIOCEPHALIC ARTERIOVENOUS FISTULA;  Surgeon: Angelia Mould, MD;  Location: [place];  Service: Vascular;  Laterality: Right;  . NEPHRECTOMY Right September 12, 2012  . NEPHRECTOMY Left 04-06-2015   [place] Right 09/07/2015   Procedure: Fistulagram;  Surgeon: Angelia Mould, MD;  Location: [place] CV [facility];  Service: Cardiovascular;  Laterality: Right;  ARM  . PERIPHERAL VASCULAR CATHETERIZATION Right 09/07/2015   Procedure: Peripheral Vascular Balloon Angioplasty;  Surgeon: Angelia Mould, MD;  Location: [place] [facility];  Service: Cardiovascular;  Laterality: Right;  upper arm venous  . PROSTATECTOMY  04-06-2015   [place] Bilateral    bilateral; 2005 L  . TOTAL KNEE ARTHROPLASTY  01/24/2012   Procedure: TOTAL KNEE ARTHROPLASTY;  Surgeon: Garald Balding, MD;  Location: [place];  Service: Orthopedics;  Laterality: Right;  RIGHT TOTAL KNEE REPLACEMENT   Social History   Occupational History  . Occupation: retired  Tobacco Use  . Smoking status: Never Smoker  . Smokeless tobacco: Never Used  Substance and Sexual Activity  . Alcohol use: Not Currently    Alcohol/week: 0.0 standard drinks    Comment:  heavy drinker in the past  . Drug use: Never  . Sexual activity: Not on file     Garald Balding, MD   Note - This record has been created using [employer].  Chart creation errors have been sought, but may not always  have been located. Such creation errors do not reflect on  the standard of medical care.

## 2019-05-23 DIAGNOSIS — Z992 Dependence on renal dialysis: Secondary | ICD-10-CM | POA: Diagnosis not present

## 2019-05-23 DIAGNOSIS — D509 Iron deficiency anemia, unspecified: Secondary | ICD-10-CM | POA: Diagnosis not present

## 2019-05-23 DIAGNOSIS — N2581 Secondary hyperparathyroidism of renal origin: Secondary | ICD-10-CM | POA: Diagnosis not present

## 2019-05-23 DIAGNOSIS — Z01812 Encounter for preprocedural laboratory examination: Secondary | ICD-10-CM | POA: Diagnosis not present

## 2019-05-23 DIAGNOSIS — D631 Anemia in chronic kidney disease: Secondary | ICD-10-CM | POA: Diagnosis not present

## 2019-05-23 DIAGNOSIS — N186 End stage renal disease: Secondary | ICD-10-CM | POA: Diagnosis not present

## 2019-05-24 DIAGNOSIS — N186 End stage renal disease: Secondary | ICD-10-CM | POA: Diagnosis not present

## 2019-05-24 DIAGNOSIS — D509 Iron deficiency anemia, unspecified: Secondary | ICD-10-CM | POA: Diagnosis not present

## 2019-05-24 DIAGNOSIS — Z01812 Encounter for preprocedural laboratory examination: Secondary | ICD-10-CM | POA: Diagnosis not present

## 2019-05-24 DIAGNOSIS — D631 Anemia in chronic kidney disease: Secondary | ICD-10-CM | POA: Diagnosis not present

## 2019-05-24 DIAGNOSIS — N2581 Secondary hyperparathyroidism of renal origin: Secondary | ICD-10-CM | POA: Diagnosis not present

## 2019-05-24 DIAGNOSIS — Z992 Dependence on renal dialysis: Secondary | ICD-10-CM | POA: Diagnosis not present

## 2019-05-25 DIAGNOSIS — N186 End stage renal disease: Secondary | ICD-10-CM | POA: Diagnosis not present

## 2019-05-25 DIAGNOSIS — D631 Anemia in chronic kidney disease: Secondary | ICD-10-CM | POA: Diagnosis not present

## 2019-05-25 DIAGNOSIS — Z992 Dependence on renal dialysis: Secondary | ICD-10-CM | POA: Diagnosis not present

## 2019-05-25 DIAGNOSIS — Z01812 Encounter for preprocedural laboratory examination: Secondary | ICD-10-CM | POA: Diagnosis not present

## 2019-05-25 DIAGNOSIS — D509 Iron deficiency anemia, unspecified: Secondary | ICD-10-CM | POA: Diagnosis not present

## 2019-05-25 DIAGNOSIS — N2581 Secondary hyperparathyroidism of renal origin: Secondary | ICD-10-CM | POA: Diagnosis not present

## 2019-05-26 DIAGNOSIS — D509 Iron deficiency anemia, unspecified: Secondary | ICD-10-CM | POA: Diagnosis not present

## 2019-05-26 DIAGNOSIS — Z992 Dependence on renal dialysis: Secondary | ICD-10-CM | POA: Diagnosis not present

## 2019-05-26 DIAGNOSIS — Z01812 Encounter for preprocedural laboratory examination: Secondary | ICD-10-CM | POA: Diagnosis not present

## 2019-05-26 DIAGNOSIS — N186 End stage renal disease: Secondary | ICD-10-CM | POA: Diagnosis not present

## 2019-05-26 DIAGNOSIS — N2581 Secondary hyperparathyroidism of renal origin: Secondary | ICD-10-CM | POA: Diagnosis not present

## 2019-05-26 DIAGNOSIS — D631 Anemia in chronic kidney disease: Secondary | ICD-10-CM | POA: Diagnosis not present

## 2019-05-27 DIAGNOSIS — Z01812 Encounter for preprocedural laboratory examination: Secondary | ICD-10-CM | POA: Diagnosis not present

## 2019-05-27 DIAGNOSIS — N2581 Secondary hyperparathyroidism of renal origin: Secondary | ICD-10-CM | POA: Diagnosis not present

## 2019-05-27 DIAGNOSIS — D509 Iron deficiency anemia, unspecified: Secondary | ICD-10-CM | POA: Diagnosis not present

## 2019-05-27 DIAGNOSIS — N186 End stage renal disease: Secondary | ICD-10-CM | POA: Diagnosis not present

## 2019-05-27 DIAGNOSIS — D631 Anemia in chronic kidney disease: Secondary | ICD-10-CM | POA: Diagnosis not present

## 2019-05-27 DIAGNOSIS — Z992 Dependence on renal dialysis: Secondary | ICD-10-CM | POA: Diagnosis not present

## 2019-05-28 DIAGNOSIS — G894 Chronic pain syndrome: Secondary | ICD-10-CM | POA: Diagnosis not present

## 2019-05-28 DIAGNOSIS — D638 Anemia in other chronic diseases classified elsewhere: Secondary | ICD-10-CM | POA: Diagnosis not present

## 2019-05-28 DIAGNOSIS — R945 Abnormal results of liver function studies: Secondary | ICD-10-CM | POA: Diagnosis not present

## 2019-05-28 DIAGNOSIS — D509 Iron deficiency anemia, unspecified: Secondary | ICD-10-CM | POA: Diagnosis not present

## 2019-05-28 DIAGNOSIS — N186 End stage renal disease: Secondary | ICD-10-CM | POA: Diagnosis not present

## 2019-05-28 DIAGNOSIS — I959 Hypotension, unspecified: Secondary | ICD-10-CM | POA: Diagnosis not present

## 2019-05-28 DIAGNOSIS — R944 Abnormal results of kidney function studies: Secondary | ICD-10-CM | POA: Diagnosis not present

## 2019-05-28 DIAGNOSIS — Z992 Dependence on renal dialysis: Secondary | ICD-10-CM | POA: Diagnosis not present

## 2019-05-28 DIAGNOSIS — Z01812 Encounter for preprocedural laboratory examination: Secondary | ICD-10-CM | POA: Diagnosis not present

## 2019-05-28 DIAGNOSIS — Z85528 Personal history of other malignant neoplasm of kidney: Secondary | ICD-10-CM | POA: Diagnosis not present

## 2019-05-28 DIAGNOSIS — I10 Essential (primary) hypertension: Secondary | ICD-10-CM | POA: Diagnosis not present

## 2019-05-28 DIAGNOSIS — R770 Abnormality of albumin: Secondary | ICD-10-CM | POA: Diagnosis not present

## 2019-05-28 DIAGNOSIS — E039 Hypothyroidism, unspecified: Secondary | ICD-10-CM | POA: Diagnosis not present

## 2019-05-28 DIAGNOSIS — N2581 Secondary hyperparathyroidism of renal origin: Secondary | ICD-10-CM | POA: Diagnosis not present

## 2019-05-28 DIAGNOSIS — F411 Generalized anxiety disorder: Secondary | ICD-10-CM | POA: Diagnosis not present

## 2019-05-28 DIAGNOSIS — D631 Anemia in chronic kidney disease: Secondary | ICD-10-CM | POA: Diagnosis not present

## 2019-05-29 DIAGNOSIS — N2581 Secondary hyperparathyroidism of renal origin: Secondary | ICD-10-CM | POA: Diagnosis not present

## 2019-05-29 DIAGNOSIS — Z992 Dependence on renal dialysis: Secondary | ICD-10-CM | POA: Diagnosis not present

## 2019-05-29 DIAGNOSIS — D631 Anemia in chronic kidney disease: Secondary | ICD-10-CM | POA: Diagnosis not present

## 2019-05-29 DIAGNOSIS — Z01812 Encounter for preprocedural laboratory examination: Secondary | ICD-10-CM | POA: Diagnosis not present

## 2019-05-29 DIAGNOSIS — N186 End stage renal disease: Secondary | ICD-10-CM | POA: Diagnosis not present

## 2019-05-29 DIAGNOSIS — D509 Iron deficiency anemia, unspecified: Secondary | ICD-10-CM | POA: Diagnosis not present

## 2019-05-30 DIAGNOSIS — D631 Anemia in chronic kidney disease: Secondary | ICD-10-CM | POA: Diagnosis not present

## 2019-05-30 DIAGNOSIS — D509 Iron deficiency anemia, unspecified: Secondary | ICD-10-CM | POA: Diagnosis not present

## 2019-05-30 DIAGNOSIS — N186 End stage renal disease: Secondary | ICD-10-CM | POA: Diagnosis not present

## 2019-05-30 DIAGNOSIS — N2581 Secondary hyperparathyroidism of renal origin: Secondary | ICD-10-CM | POA: Diagnosis not present

## 2019-05-30 DIAGNOSIS — Z992 Dependence on renal dialysis: Secondary | ICD-10-CM | POA: Diagnosis not present

## 2019-05-31 DIAGNOSIS — N2581 Secondary hyperparathyroidism of renal origin: Secondary | ICD-10-CM | POA: Diagnosis not present

## 2019-05-31 DIAGNOSIS — N186 End stage renal disease: Secondary | ICD-10-CM | POA: Diagnosis not present

## 2019-05-31 DIAGNOSIS — D509 Iron deficiency anemia, unspecified: Secondary | ICD-10-CM | POA: Diagnosis not present

## 2019-05-31 DIAGNOSIS — D631 Anemia in chronic kidney disease: Secondary | ICD-10-CM | POA: Diagnosis not present

## 2019-05-31 DIAGNOSIS — Z992 Dependence on renal dialysis: Secondary | ICD-10-CM | POA: Diagnosis not present

## 2019-06-01 DIAGNOSIS — D509 Iron deficiency anemia, unspecified: Secondary | ICD-10-CM | POA: Diagnosis not present

## 2019-06-01 DIAGNOSIS — D631 Anemia in chronic kidney disease: Secondary | ICD-10-CM | POA: Diagnosis not present

## 2019-06-01 DIAGNOSIS — Z992 Dependence on renal dialysis: Secondary | ICD-10-CM | POA: Diagnosis not present

## 2019-06-01 DIAGNOSIS — N2581 Secondary hyperparathyroidism of renal origin: Secondary | ICD-10-CM | POA: Diagnosis not present

## 2019-06-01 DIAGNOSIS — N186 End stage renal disease: Secondary | ICD-10-CM | POA: Diagnosis not present

## 2019-06-02 DIAGNOSIS — D509 Iron deficiency anemia, unspecified: Secondary | ICD-10-CM | POA: Diagnosis not present

## 2019-06-02 DIAGNOSIS — Z992 Dependence on renal dialysis: Secondary | ICD-10-CM | POA: Diagnosis not present

## 2019-06-02 DIAGNOSIS — N2581 Secondary hyperparathyroidism of renal origin: Secondary | ICD-10-CM | POA: Diagnosis not present

## 2019-06-02 DIAGNOSIS — N186 End stage renal disease: Secondary | ICD-10-CM | POA: Diagnosis not present

## 2019-06-02 DIAGNOSIS — D631 Anemia in chronic kidney disease: Secondary | ICD-10-CM | POA: Diagnosis not present

## 2019-06-03 DIAGNOSIS — Z992 Dependence on renal dialysis: Secondary | ICD-10-CM | POA: Diagnosis not present

## 2019-06-03 DIAGNOSIS — D631 Anemia in chronic kidney disease: Secondary | ICD-10-CM | POA: Diagnosis not present

## 2019-06-03 DIAGNOSIS — N186 End stage renal disease: Secondary | ICD-10-CM | POA: Diagnosis not present

## 2019-06-03 DIAGNOSIS — M79672 Pain in left foot: Secondary | ICD-10-CM | POA: Diagnosis not present

## 2019-06-03 DIAGNOSIS — I739 Peripheral vascular disease, unspecified: Secondary | ICD-10-CM | POA: Diagnosis not present

## 2019-06-03 DIAGNOSIS — N2581 Secondary hyperparathyroidism of renal origin: Secondary | ICD-10-CM | POA: Diagnosis not present

## 2019-06-03 DIAGNOSIS — M79671 Pain in right foot: Secondary | ICD-10-CM | POA: Diagnosis not present

## 2019-06-03 DIAGNOSIS — D509 Iron deficiency anemia, unspecified: Secondary | ICD-10-CM | POA: Diagnosis not present

## 2019-06-03 DIAGNOSIS — L110 Acquired keratosis follicularis: Secondary | ICD-10-CM | POA: Diagnosis not present

## 2019-06-04 DIAGNOSIS — D631 Anemia in chronic kidney disease: Secondary | ICD-10-CM | POA: Diagnosis not present

## 2019-06-04 DIAGNOSIS — D509 Iron deficiency anemia, unspecified: Secondary | ICD-10-CM | POA: Diagnosis not present

## 2019-06-04 DIAGNOSIS — N186 End stage renal disease: Secondary | ICD-10-CM | POA: Diagnosis not present

## 2019-06-04 DIAGNOSIS — N2581 Secondary hyperparathyroidism of renal origin: Secondary | ICD-10-CM | POA: Diagnosis not present

## 2019-06-04 DIAGNOSIS — Z992 Dependence on renal dialysis: Secondary | ICD-10-CM | POA: Diagnosis not present

## 2019-06-04 NOTE — Progress Notes (Signed)
[facility]    Provider:  Dr Jaynee Eagles Referring Provider: Leta Baptist, MD Primary Care Physician:  Allyn Kenner MD  CC:  Cervicalgia, numbness and tingling  Interval history June 04, 2019: I saw patient in the past for dizziness and after evaluation turned out to be orthostatic hypotension but today he is here for a different reason apparently head tingling and numbness.  Past medical history end-stage renal disease on dialysis, hypertension, congestive heart failure, renal cell carcinoma, hyponatremia, labile blood pressure, presyncope, dizziness, vertigo.  I reviewed Dr. Ardyth Harps notes, patient has tinnitus in the left ear and head numbness, a cricket sound in both ears mainly when he extends his head back for a period of time, he has had 2 neck fusions in the past with other multilevel disease, not noted any difficulty or change in his hearing, also intermittent numbness along his left ear extending into his head and neck for the past 3 years, the numbness is positional, if he extends to the right the sensation resolves or if he opens his mouth really wide the numbness will immediately go away also soreness on the tip of his tongue, examination showed normal canals, tympanic membranes and middle ear spaces, bilateral severe high-frequency sensorineural hearing loss, bilateral tinnitus likely secondary to the sensorineural hearing loss, sent over here for this intermittent ear and head tingling numbness which are likely neuropathic and possibly related to some form of nerve compression.  He was given a prednisone taper for 6 days.  He says if he fall asleep with head extended back he gets neck pain and headache but that goes away. Since November He has tingling positionally from left frontotemporal area in a vertical line down to the back of the ear, numb and tingly and positionally can make it go away, it doesn't hurt and it doesn't last long, improving since he had an injection in his neck.  So in the last monh it is better since having injections in the cervical spine. He is feeling much better. No radiation to the face.No vision changes in the left eye, no jaw pain. If he moves his head and neck lateral position the tingling goes away.  MRI of the brain (personally reviewed) showed no etiology, MRI   HPI:  Jesus [name] is a 69 y.o. male here as a referral from Dr. Benjamine Mola for vertigo. PMHx ESRD on dialysis,  HTN, lumbar back pain, CHF, anxiety, anemia, Renal cell carcinoma, hyponatremia, labile blood pressure.. Patient has episodes pre-syncope, dizziness, vertigo, shortness of breath, sweating. He has had vertigo in the past but this is different and  recently worsening - June 5th he had a severe episode, during episodes he can't walk, he sweats, he may throw up, he feels dizzy like he is going to pass out, has vertigo, shortness of breath. Happens when he he is sitting and then stands up.  Will last for a few minutes if he just stands or will resolve more quickly if he sits down. This started worsening in the setting of low blood pressure and some nights when he takes his blood pressure it can be in the systolic 72Z. He has not taken any blood pressure medications in 3-4 weeks due to his hypotension (he takes daily BP readings at home) and his blood pressure is decreasing even further.  No focal weakness. Denies dehydration. No inciting events. No falls. No headaches. In between episodes he is fatigued.   Reviewed notes, labs and imaging from outside physicians, which  showed:  Reviewed referring physician notes.  He has been going through for fistula in the left arm as a back-up for dialysis currently using peritoneal dialysis at home, his blood pressure has not been well controlled and quite labile 110-170/60 to 90s and not been taking metoprolol for many months and takes his Norvasc depending on his blood pressure.  He is followed by neurosurgery for his back and neck.  At last appointment he  reported he had 2 episodes of vertigo, he has had this in the past, he has been seen by ENT and neurology, diagnosed with vertigo, he has had studies done in his ears, tilt table testing, scanning of his brain, he reports he was told everything was  was given a perfect given a prescription for meclizine to use as needed and it expired.  Usually bothers him once or twice a year and early 2000.  MRI brain (reviewed report) 08/17/2017 showed no acute events, no recent or remote strokes, no etiology for symptoms  Review of Systems: Patient complains of symptoms per HPI as well as the following symptoms: dizziness, vertigo, shortness of breath. Pertinent negatives and positives per HPI. All others negative.   Social History   Socioeconomic History  . Marital status: Widowed    Spouse name: Not on file  . Number of children: 1  . Years of education: Not on file  . Highest education level: Some college, no degree  Occupational History  . Occupation: retired  Tobacco Use  . Smoking status: Never Smoker  . Smokeless tobacco: Never Used  Substance and Sexual Activity  . Alcohol use: Not Currently    Alcohol/week: 0.0 standard drinks    Comment: heavy drinker in the past  . Drug use: Never  . Sexual activity: Not on file  Other Topics Concern  . Not on file  Social History Narrative   Lives alone. Manages well per patient report. Does not cook much. Eats out a lot. Eats all food groups. Does not eat much fruit. Married in the past. Has one son. Used to drive a truck.    Social Determinants of Health   Financial Resource Strain:   . Difficulty of Paying Living Expenses:   Food Insecurity:   . Worried About Charity fundraiser in the Last Year:   . Arboriculturist in the Last Year:   Transportation Needs:   . Film/video editor (Medical):   Marland Kitchen Lack of Transportation (Non-Medical):   Physical Activity:   . Days of Exercise per Week:   . Minutes of Exercise per Session:   Stress:   .  Feeling of Stress :   Social Connections:   . Frequency of Communication with Friends and Family:   . Frequency of Social Gatherings with Friends and Family:   . Attends Religious Services:   . Active Member of Clubs or Organizations:   . Attends Archivist Meetings:   Marland Kitchen Marital Status:   Intimate Partner Violence:   . Fear of Current or Ex-Partner:   . Emotionally Abused:   Marland Kitchen Physically Abused:   . Sexually Abused:     Family History  Problem Relation Age of Onset  . Cancer Mother   . Hypertension Father   . Atrial fibrillation Father   . Heart disease Father   . Other Father        pacemaker    Past Medical History:  Diagnosis Date  . Anemia   . Anxiety   .  Arthritis   . Cancer [facility])    bladder, ureter, Bil kidneys  . CHF (congestive heart failure) ([place])   . Current moderate episode of major depressive disorder without prior episode ([place]) 06/28/2017  . Dermatitis    scaly bump occurs every few months, pt uses cortizone cream and it goes away  . ESRD (end stage renal disease) on dialysis ([place])    Bilateral Nephrectomies- due to cancer  . GERD (gastroesophageal reflux disease) 04/07/2015  . History of blood transfusion   . Hypertension   . Lumbar back pain   . Mental disorder   . Non compliance w medication regimen    written in error  . Pleural effusion, right    s/p right thoracentesis 10/07/15 (no malignancy by cytology report)  . Shortness of breath dyspnea    Lying down gets short of breath    Past Surgical History:  Procedure Laterality Date  . AV FISTULA PLACEMENT Right 06/05/2015   Procedure: ARTERIOVENOUS (AV) FISTULA CREATION RIGHT ARM;  Surgeon: Angelia Mould, MD;  Location: [place];  Service: Vascular;  Laterality: Right;  . AV FISTULA PLACEMENT Left 07/25/2017   Procedure: RADIOCEPHALIC  ARTERIOVENOUS FISTULA LEFT ARM;  Surgeon: Conrad [place], MD;  Location: [place];  Service: Vascular;  Laterality: Left;  . BLADDER SURGERY  04-06-2015    removal of bladder/ [facility]   . CAPD INSERTION N/A 06/24/2016   Procedure: LAPAROSCOPIC INSERTION CONTINUOUS AMBULATORY PERITONEAL DIALYSIS  (CAPD) CATHETER;  Surgeon: Clovis Riley, MD;  Location: [place];  Service: General;  Laterality: N/A;  . CARPAL TUNNEL RELEASE     left hand x 2; right hand x 1; right elbow  . CERVICAL FUSION     x 2; 2001 & 2013  . EYE SURGERY Bilateral    Cataract removal  . FISTULA SUPERFICIALIZATION Right 10/30/2015   Procedure: SUPERFICIALIZATION BRACHIOCEPHALIC ARTERIOVENOUS FISTULA Right Arm;  Surgeon: Angelia Mould, MD;  Location: [place];  Service: Vascular;  Laterality: Right;  . Hemocatheter    . IR GENERIC HISTORICAL Right 04/29/2016   IR THROMBECTOMY AV FISTULA W/THROMBOLYSIS/PTA INC/SHUNT/IMG RIGHT 04/29/2016 Arne Cleveland, MD [facility]  . IR GENERIC HISTORICAL  04/29/2016   IR US GUIDE VASC ACCESS RIGHT 04/29/2016 Arne Cleveland, MD [facility]  . IR THORACENTESIS ASP PLEURAL SPACE W/IMG GUIDE  12/06/2016  . KNEE ARTHROSCOPY Bilateral    bilateral; 2013 R; L [phone]  . KNEE ARTHROSCOPY WITH MEDIAL MENISECTOMY Left 02/26/2013   Procedure: KNEE ARTHROSCOPY WITH MEDIAL MENISECTOMY;  Surgeon: Garald Balding, MD;  Location: [place];  Service: Orthopedics;  Laterality: Left;  . LIGATION OF COMPETING BRANCHES OF ARTERIOVENOUS FISTULA Right 10/30/2015   Procedure: LIGATION OF COMPETING BRANCHES OF BRACHIOCEPHALIC ARTERIOVENOUS FISTULA;  Surgeon: Angelia Mould, MD;  Location: [place];  Service: Vascular;  Laterality: Right;  . NEPHRECTOMY Right September 12, 2012  . NEPHRECTOMY Left 04-06-2015   [place] Right 09/07/2015   Procedure: Fistulagram;  Surgeon: Angelia Mould, MD;  Location: [place] [facility];  Service: Cardiovascular;  Laterality: Right;  ARM  . PERIPHERAL VASCULAR CATHETERIZATION Right 09/07/2015   Procedure: Peripheral Vascular Balloon Angioplasty;   Surgeon: Angelia Mould, MD;  Location: [place] [facility];  Service: Cardiovascular;  Laterality: Right;  upper arm venous  . PROSTATECTOMY  04-06-2015   [place] Bilateral    bilateral; 2005 L  . TOTAL  KNEE ARTHROPLASTY  01/24/2012   Procedure: TOTAL KNEE ARTHROPLASTY;  Surgeon: Garald Balding, MD;  Location: [place];  Service: Orthopedics;  Laterality: Right;  RIGHT TOTAL KNEE REPLACEMENT    Current Outpatient Medications  Medication Sig Dispense Refill  . ALPRAZolam (XANAX) 1 MG tablet Take 1 mg by mouth daily as needed for anxiety or sleep.    Marland Kitchen amLODipine (NORVASC) 5 MG tablet TAKE 1 TABLET BY MOUTH TWICE DAILY 90 tablet 1  . clobetasol cream (TEMOVATE) 1.27 % Apply 1 application topically 2 (two) times daily as needed (skin irritation).     Marland Kitchen docusate sodium (COLACE) 250 MG capsule Take 500 mg by mouth daily.    . fluocinonide (LIDEX) 0.05 % external solution APPLY SOLUTION TOPICALLY TO AFFECTED AREA ONCE DAILY  3  . fluticasone (FLONASE) 50 MCG/ACT nasal spray Place 1 spray into both nostrils daily.    Marland Kitchen lanthanum (FOSRENOL) 500 MG chewable tablet Chew 500 mg by mouth 3 (three) times daily with meals.    Marland Kitchen levothyroxine (SYNTHROID, LEVOTHROID) 75 MCG tablet Take 1 tablet by mouth as directed. 1 hour prior to other medications  3  . mupirocin ointment (BACTROBAN) 2 % Apply 1 application topically as directed. 1-2 times a day to any wounds  4  . Naproxen Sodium (ALEVE PO) Take by mouth as needed.    Marland Kitchen oxyCODONE-acetaminophen (PERCOCET/ROXICET) 5-325 MG tablet Take 1 tablet by mouth 2 (two) times daily as needed.  0  . Polyethyl Glycol-Propyl Glycol (SYSTANE OP) Place 1 drop into both eyes daily as needed (for dry, itchy eyes).    . triamcinolone cream (KENALOG) 0.1 % Apply 1 application topically 2 (two) times daily. As directed. Do not apply to face or skin folds  6   No current facility-administered medications for this visit.     Allergies as of 06/05/2019  . (No Known Allergies)    Vitals: There were no vitals taken for this visit. Last Weight:  Wt Readings from Last 1 Encounters:  02/07/19 251 lb (113.9 kg)   Last Height:   Ht Readings from Last 1 Encounters:  09/13/18 5\' 10"  (1.778 m)   Physical exam: Exam: Gen: NAD, conversant, well nourised, obese, well groomed      No pain to palpation face and ears, no lesions, temporal pulses intact and symmetric.               Neuro: Detailed Neurologic Exam  Speech:    Speech is normal; fluent and spontaneous with normal comprehension.  Cognition:    The patient is oriented to person, place, and time;     Cranial Nerves:    The pupils are equal, round, and reactive to light.  Visual fields are full to finger confrontation. Extraocular movements are intact. Trigeminal sensation is intact and the muscles of mastication are normal. The face is symmetric. The palate elevates in the midline. Hearing intact. Voice is normal. Shoulder shrug is normal. The tongue has normal motion without fasciculations.   Motor Observation:    No asymmetry, no atrophy, and no involuntary movements noted. Tone:    Normal muscle tone.    Posture:    Posture is normal. normal erect    Strength:    Strength is V/V in the upper and lower limbs.      Sensation: intact to LT     Assessment/Plan:   69 y.o. male here as a referral from Dr. Mannie Stabile for vertigo in the past and turned out to be  orthostatic hypotension. Here today at the request of Dr. Benjamine Mola for some cervicalgia and left numbess in the occipital nerve distro improved with injections into the cervical spine. PMHx ESRD on dialysis, HTN, lumbar back pain, CHF, anxiety, anemia, Renal cell carcinoma in 2014 status post chemotherapy,  labile blood pressure.  He reports 1-2 episodes of vertigo every year since 2000 and has been evaluated by neurology and ENT in the past.   Cervicalgia: Patient has multi-level degenerative  changes in the cervical spine with prior surgery. If he extends his neck, he gets neck pain and cervico-occipital pain resolved with neck flexion. Improved with cervical injections.  Cervico-Occipital nerve irritation: He gets numbness and tingling behind the ear and around the temporal areas. This is improved with lateral flexion of his head/neck. Again likely from cervical nerve root irritation. MRI of the brain did not show any alternative etiology. No radiation into the face. Also improved with cervical injections.  Recommend medial branch blocks c2/c3 and consider RFA of the occipital nerve if needed in the future. Symptoms significantly improved with recent cervical spine injections. Return to [facility] for these.  PRIOR A/P: Supine 115/65 74, sitting 93/58 85, standing 87/58 p93, standing after 3 minutes 107/67 p 89  Orthostatic Hypotension: Pre-syncopal, dizzy, SOB, sweating when sitting to standing, corresponding with recent very low blood pressures recently per patient - today hypotensive and orthostatic in the office. He has been off of all his BP meds for 4 weeks because his blood pressure has been so low, says can be in the systolic 93T when he takes BP readings at home.  Called his nephrologist Dr. Fran Lowes [phone] to see if we could get an appointment soon, they will see him tomorrow morning. His heart care doctor is Dr. Kate Sable and advised follow up asap a referral and an email. Tried calling Dr. Mannie Stabile pcp and she was not in the office sent email. Asked patient to go to the ED if symptoms worsen. Will CC note to all physicians.   - MRI brain last month negative for stroke or other etiologies  Cc: Dr. Mannie Stabile, Dr.Befekadu, Dr. Cherre Robins, MD  [facility] [address] [place], [place] [address]  Phone [phone] Fax [phone]  I spent 60 minutes of face-to-face and  non-face-to-face time with patient on the  1. Cervicalgia   2. Cervico-occipital neuralgia of left side    diagnosis.  This included previsit chart review, lab review, study review, order entry, electronic health record documentation, patient education on the different diagnostic and therapeutic options, counseling and coordination of care, risks and benefits of management, compliance, or risk factor reduction

## 2019-06-05 ENCOUNTER — Ambulatory Visit (INDEPENDENT_AMBULATORY_CARE_PROVIDER_SITE_OTHER): Payer: Medicare Other | Admitting: Neurology

## 2019-06-05 ENCOUNTER — Other Ambulatory Visit: Payer: Self-pay

## 2019-06-05 ENCOUNTER — Encounter: Payer: Self-pay | Admitting: Neurology

## 2019-06-05 ENCOUNTER — Other Ambulatory Visit (HOSPITAL_COMMUNITY): Payer: Self-pay | Admitting: *Deleted

## 2019-06-05 VITALS — BP 145/86 | HR 68 | Temp 98.1°F | Ht 70.0 in | Wt 255.0 lb

## 2019-06-05 DIAGNOSIS — N2581 Secondary hyperparathyroidism of renal origin: Secondary | ICD-10-CM | POA: Diagnosis not present

## 2019-06-05 DIAGNOSIS — D509 Iron deficiency anemia, unspecified: Secondary | ICD-10-CM | POA: Diagnosis not present

## 2019-06-05 DIAGNOSIS — M542 Cervicalgia: Secondary | ICD-10-CM | POA: Diagnosis not present

## 2019-06-05 DIAGNOSIS — D500 Iron deficiency anemia secondary to blood loss (chronic): Secondary | ICD-10-CM

## 2019-06-05 DIAGNOSIS — Z20828 Contact with and (suspected) exposure to other viral communicable diseases: Secondary | ICD-10-CM | POA: Diagnosis not present

## 2019-06-05 DIAGNOSIS — D631 Anemia in chronic kidney disease: Secondary | ICD-10-CM | POA: Diagnosis not present

## 2019-06-05 DIAGNOSIS — M5481 Occipital neuralgia: Secondary | ICD-10-CM | POA: Diagnosis not present

## 2019-06-05 DIAGNOSIS — N186 End stage renal disease: Secondary | ICD-10-CM | POA: Diagnosis not present

## 2019-06-05 DIAGNOSIS — Z992 Dependence on renal dialysis: Secondary | ICD-10-CM | POA: Diagnosis not present

## 2019-06-05 DIAGNOSIS — C649 Malignant neoplasm of unspecified kidney, except renal pelvis: Secondary | ICD-10-CM

## 2019-06-05 NOTE — Patient Instructions (Signed)
Occipital Neuralgia  Occipital neuralgia is a type of headache that causes brief episodes of very bad pain in the back of your head. Pain from occipital neuralgia may spread (radiate) to other parts of your head. These headaches may be caused by irritation of the nerves that leave your spinal cord high up in your neck, just below the base of your skull (occipital nerves). Your occipital nerves transmit sensations from the back of your head, the top of your head, and the areas behind your ears. What are the causes? This condition can occur without any known cause (primary headache syndrome). In other cases, this condition is caused by pressure on or irritation of one of the two occipital nerves. Pressure and irritation may be due to:  Muscle spasm in the neck.  Neck injury.  Wear and tear of the vertebrae in the neck (osteoarthritis).  Disease of the disks that separate the vertebrae.  Swollen blood vessels that put pressure on the occipital nerves.  Infections.  Tumors.  Diabetes. What are the signs or symptoms? This condition causes brief burning, stabbing, electric, shocking, or shooting pain which can radiate to the top of the head. It can happen on one side or both sides of the head. It can also cause:  Pain behind the eye.  Pain triggered by neck movement or hair brushing.  Scalp tenderness.  Aching in the back of the head between episodes of very bad pain.  Pain gets worse with exposure to bright lights. How is this diagnosed? There is no test that diagnoses this condition. Your health care provider may diagnose this condition based on a physical exam and your symptoms. Other tests may be done, such as:  Imaging studies of the brain and neck (cervical spine), such as an MRI or CT scan. These look for causes of pinched nerves.  Applying pressure to the nerves in the neck to try to re-create the pain.  Injection of numbing medicine into the occipital nerve areas to see if  pain goes away (diagnostic nerve block). How is this treated? Treatment for this condition may begin with simple measures, such as:  Rest.  Massage.  Applying heat or cold on the area.  Over-the-counter pain relievers. If these measures do not work, you may need other treatments, including:  Medicines, such as: ? Prescription-strength anti-inflammatory medicines. ? Muscle relaxants. ? Anti-seizure medicines, which can relieve pain. ? Antidepressants, which can relieve pain. ? Injected medicines, such as medicines that numb the area (local anesthetic) and steroids.  Pulsed radiofrequency ablation. This is when wires are implanted to deliver electrical impulses that block pain signals from the occipital nerve.  Surgery to relieve nerve pressure.  Physical therapy. Follow these instructions at home: Pain management      Avoid any activities that cause pain.  Rest when you have an attack of pain.  Try gentle massage to relieve pain.  Try a different pillow or sleeping position.  If directed, apply heat to the affected area as told by your health care provider. Use the heat source that your health care provider recommends, such as a moist heat pack or a heating pad. ? Place a towel between your skin and the heat source. ? Leave the heat on for 20-30 minutes. ? Remove the heat if your skin turns bright red. This is especially important if you are unable to feel pain, heat, or cold. You may have a greater risk of getting burned.  If directed, apply ice to the   back of the head and neck area as told by your health care provider. ? Put ice in a plastic bag. ? Place a towel between your skin and the bag. ? Leave the ice on for 20 minutes, 2-3 times per day. General instructions  Take over-the-counter and prescription medicines only as told by your health care provider.  Avoid things that make your symptoms worse, such as bright lights.  Try to stay active. Get regular  exercise that does not cause pain. Ask your health care provider to suggest safe exercises for you.  Work with a physical therapist to learn stretching exercises you can do at home.  Practice good posture.  Keep all follow-up visits as told by your health care provider. This is important. Contact a health care provider if:  Your medicine is not working.  You have new or worsening symptoms. Get help right away if:  You have very bad head pain that does not go away.  You have a sudden change in vision, balance, or speech. Summary  Occipital neuralgia is a type of headache that causes brief episodes of very bad pain in the back of your head.  Pain from occipital neuralgia may spread (radiate) to other parts of your head.  Treatment for this condition includes rest, massage, and medicines. This information is not intended to replace advice given to you by your health care provider. Make sure you discuss any questions you have with your health care provider. Document Revised: 01/31/2017 Document Reviewed: 04/21/2016 Elsevier Patient Education  2020 [employer]  

## 2019-06-06 ENCOUNTER — Inpatient Hospital Stay (HOSPITAL_COMMUNITY): Payer: Medicare Other | Attending: Hematology

## 2019-06-06 DIAGNOSIS — D631 Anemia in chronic kidney disease: Secondary | ICD-10-CM | POA: Diagnosis not present

## 2019-06-06 DIAGNOSIS — Z992 Dependence on renal dialysis: Secondary | ICD-10-CM | POA: Diagnosis not present

## 2019-06-06 DIAGNOSIS — D509 Iron deficiency anemia, unspecified: Secondary | ICD-10-CM | POA: Diagnosis not present

## 2019-06-06 DIAGNOSIS — N2581 Secondary hyperparathyroidism of renal origin: Secondary | ICD-10-CM | POA: Diagnosis not present

## 2019-06-06 DIAGNOSIS — N186 End stage renal disease: Secondary | ICD-10-CM | POA: Diagnosis not present

## 2019-06-07 DIAGNOSIS — D509 Iron deficiency anemia, unspecified: Secondary | ICD-10-CM | POA: Diagnosis not present

## 2019-06-07 DIAGNOSIS — N2581 Secondary hyperparathyroidism of renal origin: Secondary | ICD-10-CM | POA: Diagnosis not present

## 2019-06-07 DIAGNOSIS — D631 Anemia in chronic kidney disease: Secondary | ICD-10-CM | POA: Diagnosis not present

## 2019-06-07 DIAGNOSIS — N186 End stage renal disease: Secondary | ICD-10-CM | POA: Diagnosis not present

## 2019-06-07 DIAGNOSIS — Z992 Dependence on renal dialysis: Secondary | ICD-10-CM | POA: Diagnosis not present

## 2019-06-08 DIAGNOSIS — D509 Iron deficiency anemia, unspecified: Secondary | ICD-10-CM | POA: Diagnosis not present

## 2019-06-08 DIAGNOSIS — N186 End stage renal disease: Secondary | ICD-10-CM | POA: Diagnosis not present

## 2019-06-08 DIAGNOSIS — N2581 Secondary hyperparathyroidism of renal origin: Secondary | ICD-10-CM | POA: Diagnosis not present

## 2019-06-08 DIAGNOSIS — Z992 Dependence on renal dialysis: Secondary | ICD-10-CM | POA: Diagnosis not present

## 2019-06-08 DIAGNOSIS — D631 Anemia in chronic kidney disease: Secondary | ICD-10-CM | POA: Diagnosis not present

## 2019-06-09 DIAGNOSIS — N2581 Secondary hyperparathyroidism of renal origin: Secondary | ICD-10-CM | POA: Diagnosis not present

## 2019-06-09 DIAGNOSIS — D509 Iron deficiency anemia, unspecified: Secondary | ICD-10-CM | POA: Diagnosis not present

## 2019-06-09 DIAGNOSIS — N186 End stage renal disease: Secondary | ICD-10-CM | POA: Diagnosis not present

## 2019-06-09 DIAGNOSIS — Z992 Dependence on renal dialysis: Secondary | ICD-10-CM | POA: Diagnosis not present

## 2019-06-09 DIAGNOSIS — D631 Anemia in chronic kidney disease: Secondary | ICD-10-CM | POA: Diagnosis not present

## 2019-06-10 DIAGNOSIS — D631 Anemia in chronic kidney disease: Secondary | ICD-10-CM | POA: Diagnosis not present

## 2019-06-10 DIAGNOSIS — D509 Iron deficiency anemia, unspecified: Secondary | ICD-10-CM | POA: Diagnosis not present

## 2019-06-10 DIAGNOSIS — N186 End stage renal disease: Secondary | ICD-10-CM | POA: Diagnosis not present

## 2019-06-10 DIAGNOSIS — N2581 Secondary hyperparathyroidism of renal origin: Secondary | ICD-10-CM | POA: Diagnosis not present

## 2019-06-10 DIAGNOSIS — Z992 Dependence on renal dialysis: Secondary | ICD-10-CM | POA: Diagnosis not present

## 2019-06-11 DIAGNOSIS — N186 End stage renal disease: Secondary | ICD-10-CM | POA: Diagnosis not present

## 2019-06-11 DIAGNOSIS — N2581 Secondary hyperparathyroidism of renal origin: Secondary | ICD-10-CM | POA: Diagnosis not present

## 2019-06-11 DIAGNOSIS — Z992 Dependence on renal dialysis: Secondary | ICD-10-CM | POA: Diagnosis not present

## 2019-06-11 DIAGNOSIS — D631 Anemia in chronic kidney disease: Secondary | ICD-10-CM | POA: Diagnosis not present

## 2019-06-11 DIAGNOSIS — D509 Iron deficiency anemia, unspecified: Secondary | ICD-10-CM | POA: Diagnosis not present

## 2019-06-12 DIAGNOSIS — D631 Anemia in chronic kidney disease: Secondary | ICD-10-CM | POA: Diagnosis not present

## 2019-06-12 DIAGNOSIS — D509 Iron deficiency anemia, unspecified: Secondary | ICD-10-CM | POA: Diagnosis not present

## 2019-06-12 DIAGNOSIS — Z992 Dependence on renal dialysis: Secondary | ICD-10-CM | POA: Diagnosis not present

## 2019-06-12 DIAGNOSIS — N2581 Secondary hyperparathyroidism of renal origin: Secondary | ICD-10-CM | POA: Diagnosis not present

## 2019-06-12 DIAGNOSIS — N186 End stage renal disease: Secondary | ICD-10-CM | POA: Diagnosis not present

## 2019-06-13 ENCOUNTER — Ambulatory Visit (HOSPITAL_COMMUNITY): Payer: Medicare Other | Admitting: Hematology

## 2019-06-13 DIAGNOSIS — Z992 Dependence on renal dialysis: Secondary | ICD-10-CM | POA: Diagnosis not present

## 2019-06-13 DIAGNOSIS — D631 Anemia in chronic kidney disease: Secondary | ICD-10-CM | POA: Diagnosis not present

## 2019-06-13 DIAGNOSIS — N2581 Secondary hyperparathyroidism of renal origin: Secondary | ICD-10-CM | POA: Diagnosis not present

## 2019-06-13 DIAGNOSIS — N186 End stage renal disease: Secondary | ICD-10-CM | POA: Diagnosis not present

## 2019-06-13 DIAGNOSIS — D509 Iron deficiency anemia, unspecified: Secondary | ICD-10-CM | POA: Diagnosis not present

## 2019-06-14 DIAGNOSIS — N2581 Secondary hyperparathyroidism of renal origin: Secondary | ICD-10-CM | POA: Diagnosis not present

## 2019-06-14 DIAGNOSIS — Z992 Dependence on renal dialysis: Secondary | ICD-10-CM | POA: Diagnosis not present

## 2019-06-14 DIAGNOSIS — N186 End stage renal disease: Secondary | ICD-10-CM | POA: Diagnosis not present

## 2019-06-14 DIAGNOSIS — D509 Iron deficiency anemia, unspecified: Secondary | ICD-10-CM | POA: Diagnosis not present

## 2019-06-14 DIAGNOSIS — D631 Anemia in chronic kidney disease: Secondary | ICD-10-CM | POA: Diagnosis not present

## 2019-06-15 DIAGNOSIS — D509 Iron deficiency anemia, unspecified: Secondary | ICD-10-CM | POA: Diagnosis not present

## 2019-06-15 DIAGNOSIS — Z992 Dependence on renal dialysis: Secondary | ICD-10-CM | POA: Diagnosis not present

## 2019-06-15 DIAGNOSIS — N2581 Secondary hyperparathyroidism of renal origin: Secondary | ICD-10-CM | POA: Diagnosis not present

## 2019-06-15 DIAGNOSIS — N186 End stage renal disease: Secondary | ICD-10-CM | POA: Diagnosis not present

## 2019-06-15 DIAGNOSIS — D631 Anemia in chronic kidney disease: Secondary | ICD-10-CM | POA: Diagnosis not present

## 2019-06-16 DIAGNOSIS — D509 Iron deficiency anemia, unspecified: Secondary | ICD-10-CM | POA: Diagnosis not present

## 2019-06-16 DIAGNOSIS — Z992 Dependence on renal dialysis: Secondary | ICD-10-CM | POA: Diagnosis not present

## 2019-06-16 DIAGNOSIS — N186 End stage renal disease: Secondary | ICD-10-CM | POA: Diagnosis not present

## 2019-06-16 DIAGNOSIS — N2581 Secondary hyperparathyroidism of renal origin: Secondary | ICD-10-CM | POA: Diagnosis not present

## 2019-06-16 DIAGNOSIS — D631 Anemia in chronic kidney disease: Secondary | ICD-10-CM | POA: Diagnosis not present

## 2019-06-17 DIAGNOSIS — N186 End stage renal disease: Secondary | ICD-10-CM | POA: Diagnosis not present

## 2019-06-17 DIAGNOSIS — Z992 Dependence on renal dialysis: Secondary | ICD-10-CM | POA: Diagnosis not present

## 2019-06-17 DIAGNOSIS — D509 Iron deficiency anemia, unspecified: Secondary | ICD-10-CM | POA: Diagnosis not present

## 2019-06-17 DIAGNOSIS — N2581 Secondary hyperparathyroidism of renal origin: Secondary | ICD-10-CM | POA: Diagnosis not present

## 2019-06-17 DIAGNOSIS — D631 Anemia in chronic kidney disease: Secondary | ICD-10-CM | POA: Diagnosis not present

## 2019-06-18 DIAGNOSIS — N2581 Secondary hyperparathyroidism of renal origin: Secondary | ICD-10-CM | POA: Diagnosis not present

## 2019-06-18 DIAGNOSIS — Z992 Dependence on renal dialysis: Secondary | ICD-10-CM | POA: Diagnosis not present

## 2019-06-18 DIAGNOSIS — N186 End stage renal disease: Secondary | ICD-10-CM | POA: Diagnosis not present

## 2019-06-18 DIAGNOSIS — D509 Iron deficiency anemia, unspecified: Secondary | ICD-10-CM | POA: Diagnosis not present

## 2019-06-18 DIAGNOSIS — D631 Anemia in chronic kidney disease: Secondary | ICD-10-CM | POA: Diagnosis not present

## 2019-06-19 DIAGNOSIS — D631 Anemia in chronic kidney disease: Secondary | ICD-10-CM | POA: Diagnosis not present

## 2019-06-19 DIAGNOSIS — Z992 Dependence on renal dialysis: Secondary | ICD-10-CM | POA: Diagnosis not present

## 2019-06-19 DIAGNOSIS — N186 End stage renal disease: Secondary | ICD-10-CM | POA: Diagnosis not present

## 2019-06-19 DIAGNOSIS — D509 Iron deficiency anemia, unspecified: Secondary | ICD-10-CM | POA: Diagnosis not present

## 2019-06-19 DIAGNOSIS — N2581 Secondary hyperparathyroidism of renal origin: Secondary | ICD-10-CM | POA: Diagnosis not present

## 2019-06-20 DIAGNOSIS — N2581 Secondary hyperparathyroidism of renal origin: Secondary | ICD-10-CM | POA: Diagnosis not present

## 2019-06-20 DIAGNOSIS — D509 Iron deficiency anemia, unspecified: Secondary | ICD-10-CM | POA: Diagnosis not present

## 2019-06-20 DIAGNOSIS — Z992 Dependence on renal dialysis: Secondary | ICD-10-CM | POA: Diagnosis not present

## 2019-06-20 DIAGNOSIS — N186 End stage renal disease: Secondary | ICD-10-CM | POA: Diagnosis not present

## 2019-06-20 DIAGNOSIS — D631 Anemia in chronic kidney disease: Secondary | ICD-10-CM | POA: Diagnosis not present

## 2019-06-21 DIAGNOSIS — N186 End stage renal disease: Secondary | ICD-10-CM | POA: Diagnosis not present

## 2019-06-21 DIAGNOSIS — Z992 Dependence on renal dialysis: Secondary | ICD-10-CM | POA: Diagnosis not present

## 2019-06-21 DIAGNOSIS — D509 Iron deficiency anemia, unspecified: Secondary | ICD-10-CM | POA: Diagnosis not present

## 2019-06-21 DIAGNOSIS — N2581 Secondary hyperparathyroidism of renal origin: Secondary | ICD-10-CM | POA: Diagnosis not present

## 2019-06-21 DIAGNOSIS — D631 Anemia in chronic kidney disease: Secondary | ICD-10-CM | POA: Diagnosis not present

## 2019-06-22 DIAGNOSIS — D509 Iron deficiency anemia, unspecified: Secondary | ICD-10-CM | POA: Diagnosis not present

## 2019-06-22 DIAGNOSIS — D631 Anemia in chronic kidney disease: Secondary | ICD-10-CM | POA: Diagnosis not present

## 2019-06-22 DIAGNOSIS — N2581 Secondary hyperparathyroidism of renal origin: Secondary | ICD-10-CM | POA: Diagnosis not present

## 2019-06-22 DIAGNOSIS — Z992 Dependence on renal dialysis: Secondary | ICD-10-CM | POA: Diagnosis not present

## 2019-06-22 DIAGNOSIS — N186 End stage renal disease: Secondary | ICD-10-CM | POA: Diagnosis not present

## 2019-06-23 DIAGNOSIS — D509 Iron deficiency anemia, unspecified: Secondary | ICD-10-CM | POA: Diagnosis not present

## 2019-06-23 DIAGNOSIS — N186 End stage renal disease: Secondary | ICD-10-CM | POA: Diagnosis not present

## 2019-06-23 DIAGNOSIS — D631 Anemia in chronic kidney disease: Secondary | ICD-10-CM | POA: Diagnosis not present

## 2019-06-23 DIAGNOSIS — Z992 Dependence on renal dialysis: Secondary | ICD-10-CM | POA: Diagnosis not present

## 2019-06-23 DIAGNOSIS — N2581 Secondary hyperparathyroidism of renal origin: Secondary | ICD-10-CM | POA: Diagnosis not present

## 2019-06-24 DIAGNOSIS — N2581 Secondary hyperparathyroidism of renal origin: Secondary | ICD-10-CM | POA: Diagnosis not present

## 2019-06-24 DIAGNOSIS — Z992 Dependence on renal dialysis: Secondary | ICD-10-CM | POA: Diagnosis not present

## 2019-06-24 DIAGNOSIS — D509 Iron deficiency anemia, unspecified: Secondary | ICD-10-CM | POA: Diagnosis not present

## 2019-06-24 DIAGNOSIS — D631 Anemia in chronic kidney disease: Secondary | ICD-10-CM | POA: Diagnosis not present

## 2019-06-24 DIAGNOSIS — N186 End stage renal disease: Secondary | ICD-10-CM | POA: Diagnosis not present

## 2019-06-25 DIAGNOSIS — D509 Iron deficiency anemia, unspecified: Secondary | ICD-10-CM | POA: Diagnosis not present

## 2019-06-25 DIAGNOSIS — N2581 Secondary hyperparathyroidism of renal origin: Secondary | ICD-10-CM | POA: Diagnosis not present

## 2019-06-25 DIAGNOSIS — Z992 Dependence on renal dialysis: Secondary | ICD-10-CM | POA: Diagnosis not present

## 2019-06-25 DIAGNOSIS — D631 Anemia in chronic kidney disease: Secondary | ICD-10-CM | POA: Diagnosis not present

## 2019-06-25 DIAGNOSIS — N186 End stage renal disease: Secondary | ICD-10-CM | POA: Diagnosis not present

## 2019-06-26 DIAGNOSIS — N186 End stage renal disease: Secondary | ICD-10-CM | POA: Diagnosis not present

## 2019-06-26 DIAGNOSIS — N2581 Secondary hyperparathyroidism of renal origin: Secondary | ICD-10-CM | POA: Diagnosis not present

## 2019-06-26 DIAGNOSIS — D631 Anemia in chronic kidney disease: Secondary | ICD-10-CM | POA: Diagnosis not present

## 2019-06-26 DIAGNOSIS — D509 Iron deficiency anemia, unspecified: Secondary | ICD-10-CM | POA: Diagnosis not present

## 2019-06-26 DIAGNOSIS — Z992 Dependence on renal dialysis: Secondary | ICD-10-CM | POA: Diagnosis not present

## 2019-06-27 DIAGNOSIS — I499 Cardiac arrhythmia, unspecified: Secondary | ICD-10-CM | POA: Diagnosis not present

## 2019-06-27 DIAGNOSIS — Z01810 Encounter for preprocedural cardiovascular examination: Secondary | ICD-10-CM | POA: Diagnosis not present

## 2019-06-27 DIAGNOSIS — Z905 Acquired absence of kidney: Secondary | ICD-10-CM | POA: Diagnosis not present

## 2019-06-27 DIAGNOSIS — Z9221 Personal history of antineoplastic chemotherapy: Secondary | ICD-10-CM | POA: Diagnosis not present

## 2019-06-27 DIAGNOSIS — Z79899 Other long term (current) drug therapy: Secondary | ICD-10-CM | POA: Diagnosis not present

## 2019-06-27 DIAGNOSIS — K922 Gastrointestinal hemorrhage, unspecified: Secondary | ICD-10-CM | POA: Diagnosis not present

## 2019-06-27 DIAGNOSIS — K6389 Other specified diseases of intestine: Secondary | ICD-10-CM | POA: Diagnosis not present

## 2019-06-27 DIAGNOSIS — Z8550 Personal history of malignant neoplasm of unspecified urinary tract organ: Secondary | ICD-10-CM | POA: Diagnosis not present

## 2019-06-27 DIAGNOSIS — Z992 Dependence on renal dialysis: Secondary | ICD-10-CM | POA: Diagnosis not present

## 2019-06-27 DIAGNOSIS — D84821 Immunodeficiency due to drugs: Secondary | ICD-10-CM | POA: Diagnosis not present

## 2019-06-27 DIAGNOSIS — E875 Hyperkalemia: Secondary | ICD-10-CM | POA: Diagnosis present

## 2019-06-27 DIAGNOSIS — Z940 Kidney transplant status: Secondary | ICD-10-CM | POA: Diagnosis not present

## 2019-06-27 DIAGNOSIS — N170 Acute kidney failure with tubular necrosis: Secondary | ICD-10-CM | POA: Diagnosis present

## 2019-06-27 DIAGNOSIS — J984 Other disorders of lung: Secondary | ICD-10-CM | POA: Diagnosis not present

## 2019-06-27 DIAGNOSIS — K921 Melena: Secondary | ICD-10-CM | POA: Diagnosis not present

## 2019-06-27 DIAGNOSIS — R944 Abnormal results of kidney function studies: Secondary | ICD-10-CM | POA: Diagnosis not present

## 2019-06-27 DIAGNOSIS — D509 Iron deficiency anemia, unspecified: Secondary | ICD-10-CM | POA: Diagnosis not present

## 2019-06-27 DIAGNOSIS — J986 Disorders of diaphragm: Secondary | ICD-10-CM | POA: Diagnosis not present

## 2019-06-27 DIAGNOSIS — D631 Anemia in chronic kidney disease: Secondary | ICD-10-CM | POA: Diagnosis not present

## 2019-06-27 DIAGNOSIS — R079 Chest pain, unspecified: Secondary | ICD-10-CM | POA: Diagnosis not present

## 2019-06-27 DIAGNOSIS — R9389 Abnormal findings on diagnostic imaging of other specified body structures: Secondary | ICD-10-CM | POA: Diagnosis not present

## 2019-06-27 DIAGNOSIS — Z936 Other artificial openings of urinary tract status: Secondary | ICD-10-CM | POA: Diagnosis not present

## 2019-06-27 DIAGNOSIS — Z8249 Family history of ischemic heart disease and other diseases of the circulatory system: Secondary | ICD-10-CM | POA: Diagnosis not present

## 2019-06-27 DIAGNOSIS — R0681 Apnea, not elsewhere classified: Secondary | ICD-10-CM | POA: Diagnosis not present

## 2019-06-27 DIAGNOSIS — N186 End stage renal disease: Secondary | ICD-10-CM | POA: Diagnosis not present

## 2019-06-27 DIAGNOSIS — R092 Respiratory arrest: Secondary | ICD-10-CM | POA: Diagnosis not present

## 2019-06-27 DIAGNOSIS — U071 COVID-19: Secondary | ICD-10-CM | POA: Diagnosis not present

## 2019-06-27 DIAGNOSIS — R52 Pain, unspecified: Secondary | ICD-10-CM | POA: Diagnosis not present

## 2019-06-27 DIAGNOSIS — I700 Atherosclerosis of aorta: Secondary | ICD-10-CM | POA: Diagnosis not present

## 2019-06-27 DIAGNOSIS — E871 Hypo-osmolality and hyponatremia: Secondary | ICD-10-CM | POA: Diagnosis present

## 2019-06-27 DIAGNOSIS — T380X5A Adverse effect of glucocorticoids and synthetic analogues, initial encounter: Secondary | ICD-10-CM | POA: Diagnosis present

## 2019-06-27 DIAGNOSIS — Z01818 Encounter for other preprocedural examination: Secondary | ICD-10-CM | POA: Diagnosis not present

## 2019-06-27 DIAGNOSIS — R918 Other nonspecific abnormal finding of lung field: Secondary | ICD-10-CM | POA: Diagnosis not present

## 2019-06-27 DIAGNOSIS — R404 Transient alteration of awareness: Secondary | ICD-10-CM | POA: Diagnosis not present

## 2019-06-27 DIAGNOSIS — Z4822 Encounter for aftercare following kidney transplant: Secondary | ICD-10-CM | POA: Diagnosis not present

## 2019-06-27 DIAGNOSIS — T50905A Adverse effect of unspecified drugs, medicaments and biological substances, initial encounter: Secondary | ICD-10-CM | POA: Diagnosis not present

## 2019-06-27 DIAGNOSIS — I469 Cardiac arrest, cause unspecified: Secondary | ICD-10-CM | POA: Diagnosis not present

## 2019-06-27 DIAGNOSIS — Z8554 Personal history of malignant neoplasm of ureter: Secondary | ICD-10-CM | POA: Diagnosis not present

## 2019-06-27 DIAGNOSIS — D72829 Elevated white blood cell count, unspecified: Secondary | ICD-10-CM | POA: Diagnosis not present

## 2019-06-27 DIAGNOSIS — Z960 Presence of urogenital implants: Secondary | ICD-10-CM | POA: Diagnosis not present

## 2019-06-27 DIAGNOSIS — I10 Essential (primary) hypertension: Secondary | ICD-10-CM | POA: Diagnosis not present

## 2019-06-27 DIAGNOSIS — D649 Anemia, unspecified: Secondary | ICD-10-CM | POA: Diagnosis not present

## 2019-06-27 DIAGNOSIS — Z85528 Personal history of other malignant neoplasm of kidney: Secondary | ICD-10-CM | POA: Diagnosis not present

## 2019-06-27 DIAGNOSIS — I2510 Atherosclerotic heart disease of native coronary artery without angina pectoris: Secondary | ICD-10-CM | POA: Diagnosis not present

## 2019-06-27 DIAGNOSIS — N2581 Secondary hyperparathyroidism of renal origin: Secondary | ICD-10-CM | POA: Diagnosis not present

## 2019-06-27 DIAGNOSIS — Z4902 Encounter for fitting and adjustment of peritoneal dialysis catheter: Secondary | ICD-10-CM | POA: Diagnosis not present

## 2019-06-27 DIAGNOSIS — I120 Hypertensive chronic kidney disease with stage 5 chronic kidney disease or end stage renal disease: Secondary | ICD-10-CM | POA: Diagnosis present

## 2019-06-27 DIAGNOSIS — D849 Immunodeficiency, unspecified: Secondary | ICD-10-CM | POA: Diagnosis not present

## 2019-06-27 DIAGNOSIS — J948 Other specified pleural conditions: Secondary | ICD-10-CM | POA: Diagnosis not present

## 2019-06-27 DIAGNOSIS — T8619 Other complication of kidney transplant: Secondary | ICD-10-CM | POA: Diagnosis not present

## 2019-06-27 DIAGNOSIS — M7989 Other specified soft tissue disorders: Secondary | ICD-10-CM | POA: Diagnosis not present

## 2019-06-27 DIAGNOSIS — D638 Anemia in other chronic diseases classified elsewhere: Secondary | ICD-10-CM | POA: Diagnosis present

## 2019-06-27 DIAGNOSIS — J90 Pleural effusion, not elsewhere classified: Secondary | ICD-10-CM | POA: Diagnosis not present

## 2019-06-27 DIAGNOSIS — Z8616 Personal history of COVID-19: Secondary | ICD-10-CM | POA: Diagnosis not present

## 2019-06-27 DIAGNOSIS — G4700 Insomnia, unspecified: Secondary | ICD-10-CM | POA: Diagnosis present

## 2019-06-28 DIAGNOSIS — Z992 Dependence on renal dialysis: Secondary | ICD-10-CM | POA: Diagnosis not present

## 2019-06-28 DIAGNOSIS — N186 End stage renal disease: Secondary | ICD-10-CM | POA: Diagnosis not present

## 2019-07-08 DIAGNOSIS — I10 Essential (primary) hypertension: Secondary | ICD-10-CM | POA: Diagnosis not present

## 2019-07-08 DIAGNOSIS — Z940 Kidney transplant status: Secondary | ICD-10-CM | POA: Diagnosis not present

## 2019-07-08 DIAGNOSIS — I469 Cardiac arrest, cause unspecified: Secondary | ICD-10-CM | POA: Diagnosis not present

## 2019-07-08 DIAGNOSIS — Z79899 Other long term (current) drug therapy: Secondary | ICD-10-CM | POA: Diagnosis not present

## 2019-07-30 DEATH — deceased
